# Patient Record
Sex: Female | Born: 1945 | Race: White | Hispanic: No | Smoking: Former smoker
Health system: Southern US, Community
[De-identification: ages and names within clinical notes are randomized; demographics above are authoritative.]

## PROBLEM LIST (undated history)

## (undated) DIAGNOSIS — E785 Hyperlipidemia, unspecified: Secondary | ICD-10-CM

## (undated) DIAGNOSIS — D869 Sarcoidosis, unspecified: Secondary | ICD-10-CM

## (undated) DIAGNOSIS — M1990 Unspecified osteoarthritis, unspecified site: Secondary | ICD-10-CM

## (undated) HISTORY — DX: Hyperlipidemia, unspecified: E78.5

## (undated) HISTORY — PX: REDUCTION MAMMAPLASTY: SUR839

## (undated) HISTORY — PX: BREAST BIOPSY: SHX20

## (undated) HISTORY — DX: Unspecified osteoarthritis, unspecified site: M19.90

## (undated) HISTORY — PX: BREAST SURGERY: SHX581

## (undated) HISTORY — PX: ABDOMINAL HYSTERECTOMY: SHX81

---

## 1998-03-22 ENCOUNTER — Encounter: Admission: RE | Admit: 1998-03-22 | Discharge: 1998-06-20 | Payer: Self-pay | Admitting: Anesthesiology

## 1999-01-03 ENCOUNTER — Emergency Department (HOSPITAL_COMMUNITY): Admission: EM | Admit: 1999-01-03 | Discharge: 1999-01-03 | Payer: Self-pay | Admitting: Emergency Medicine

## 1999-01-03 ENCOUNTER — Encounter: Payer: Self-pay | Admitting: Emergency Medicine

## 1999-08-18 ENCOUNTER — Encounter: Payer: Self-pay | Admitting: Family Medicine

## 1999-08-18 ENCOUNTER — Encounter: Admission: RE | Admit: 1999-08-18 | Discharge: 1999-08-18 | Payer: Self-pay | Admitting: Family Medicine

## 1999-08-22 ENCOUNTER — Encounter: Admission: RE | Admit: 1999-08-22 | Discharge: 1999-08-22 | Payer: Self-pay | Admitting: Family Medicine

## 1999-08-22 ENCOUNTER — Encounter: Payer: Self-pay | Admitting: Family Medicine

## 2000-12-05 ENCOUNTER — Encounter: Payer: Self-pay | Admitting: Family Medicine

## 2000-12-05 ENCOUNTER — Encounter: Admission: RE | Admit: 2000-12-05 | Discharge: 2000-12-05 | Payer: Self-pay | Admitting: Family Medicine

## 2001-04-23 ENCOUNTER — Encounter: Admission: RE | Admit: 2001-04-23 | Discharge: 2001-07-22 | Payer: Self-pay | Admitting: Family Medicine

## 2001-07-30 ENCOUNTER — Encounter (INDEPENDENT_AMBULATORY_CARE_PROVIDER_SITE_OTHER): Payer: Self-pay | Admitting: Specialist

## 2001-07-30 ENCOUNTER — Ambulatory Visit (HOSPITAL_COMMUNITY): Admission: RE | Admit: 2001-07-30 | Discharge: 2001-07-30 | Payer: Self-pay | Admitting: Gastroenterology

## 2004-12-07 ENCOUNTER — Emergency Department (HOSPITAL_COMMUNITY): Admission: EM | Admit: 2004-12-07 | Discharge: 2004-12-07 | Payer: Self-pay | Admitting: Emergency Medicine

## 2005-08-01 ENCOUNTER — Encounter: Admission: RE | Admit: 2005-08-01 | Discharge: 2005-08-01 | Payer: Self-pay | Admitting: Family Medicine

## 2006-05-15 ENCOUNTER — Other Ambulatory Visit: Admission: RE | Admit: 2006-05-15 | Discharge: 2006-05-15 | Payer: Self-pay | Admitting: Family Medicine

## 2006-05-29 ENCOUNTER — Encounter: Admission: RE | Admit: 2006-05-29 | Discharge: 2006-05-29 | Payer: Self-pay | Admitting: Family Medicine

## 2006-06-10 ENCOUNTER — Encounter: Admission: RE | Admit: 2006-06-10 | Discharge: 2006-06-10 | Payer: Self-pay | Admitting: Family Medicine

## 2006-07-24 ENCOUNTER — Ambulatory Visit (HOSPITAL_COMMUNITY): Admission: RE | Admit: 2006-07-24 | Discharge: 2006-07-24 | Payer: Self-pay | Admitting: Cardiology

## 2006-08-05 ENCOUNTER — Encounter: Admission: RE | Admit: 2006-08-05 | Discharge: 2006-08-05 | Payer: Self-pay | Admitting: Family Medicine

## 2006-11-12 ENCOUNTER — Other Ambulatory Visit: Admission: RE | Admit: 2006-11-12 | Discharge: 2006-11-12 | Payer: Self-pay | Admitting: Obstetrics and Gynecology

## 2007-07-07 ENCOUNTER — Other Ambulatory Visit: Admission: RE | Admit: 2007-07-07 | Discharge: 2007-07-07 | Payer: Self-pay | Admitting: Obstetrics and Gynecology

## 2007-08-04 ENCOUNTER — Other Ambulatory Visit: Admission: RE | Admit: 2007-08-04 | Discharge: 2007-08-04 | Payer: Self-pay | Admitting: Obstetrics and Gynecology

## 2008-08-05 ENCOUNTER — Other Ambulatory Visit: Admission: RE | Admit: 2008-08-05 | Discharge: 2008-08-05 | Payer: Self-pay | Admitting: Obstetrics and Gynecology

## 2009-01-31 ENCOUNTER — Other Ambulatory Visit: Admission: RE | Admit: 2009-01-31 | Discharge: 2009-01-31 | Payer: Self-pay | Admitting: Obstetrics and Gynecology

## 2009-11-14 ENCOUNTER — Encounter: Admission: RE | Admit: 2009-11-14 | Discharge: 2009-11-14 | Payer: Self-pay | Admitting: Family Medicine

## 2010-11-24 NOTE — Procedures (Signed)
[facility]  Patient:    Alexis Murphy, Alexis Murphy Visit Number: [id number] MRN: [id number]          Service Type: END Location: ENDO Attending Physician:  Rafael Bihari Dictated by:   Elyse Jarvis Amedeo Plenty, M.D. Proc. Date: 07/30/01 Admit Date:  07/30/2001   CC:         Russ Halo. Sheryn Bison, M.D.   Procedure Report  PROCEDURE:  Colonoscopy with polypectomy.  INDICATIONS:  Polyps seen on flexible sigmoidoscopy.  DESCRIPTION OF PROCEDURE:  The patient was placed in the left lateral decubitus position and placed on the pulse monitor with continuous low flow oxygen delivered via nasal cannula. She was sedated with 100 mg IV Demerol and 10 mg IV Versed. The Olympus video colonoscope was inserted into the rectum and advanced to the cecum, confirmed by transillumination at McBurneys point and visualization of the ileocecal valve and appendiceal orifice. The prep was excellent. The terminal ileum was entered and explored for several centimeters and appeared to within normal limits. The cecum, ascending, transverse, descending, and sigmoid colon all appeared normal with no masses, polyps, diverticula or other mucosal abnormalities. The rectum revealed approximately four sessile relatively flat polyps no more than 6 mm in diameter from 3 cm from the anal verge up to about 18 cm from the anal verge. These were all fulgurated by hot biopsy. There were two or three diminutive polyps no more than 3 mm in diameter that I did not fulgurate. The colonoscope was then withdrawn and the patient returned to the recovery room in stable condition. She tolerated the procedure well and there were no immediate complications.  IMPRESSION:  Several small rectal polyps.  PLAN:  Await histology and if adenomatous, will recommend a repeat study in three years. Dictated by:   Elyse Jarvis Amedeo Plenty, M.D. Attending Physician:  Rafael Bihari DD:  07/30/01 TD:  07/31/01 Job:  [id number]

## 2012-02-29 ENCOUNTER — Other Ambulatory Visit: Payer: Self-pay | Admitting: Oral Surgery

## 2012-04-25 ENCOUNTER — Other Ambulatory Visit: Payer: Self-pay | Admitting: Family Medicine

## 2012-04-25 DIAGNOSIS — Z1231 Encounter for screening mammogram for malignant neoplasm of breast: Secondary | ICD-10-CM

## 2012-04-25 DIAGNOSIS — Z780 Asymptomatic menopausal state: Secondary | ICD-10-CM

## 2012-05-27 ENCOUNTER — Other Ambulatory Visit: Payer: Self-pay

## 2012-05-27 ENCOUNTER — Ambulatory Visit: Payer: Self-pay

## 2012-07-01 ENCOUNTER — Ambulatory Visit
Admission: RE | Admit: 2012-07-01 | Discharge: 2012-07-01 | Disposition: A | Discharge status: Home / Self Care | Payer: Medicare Other | Source: Ambulatory Visit | Attending: Family Medicine | Admitting: Family Medicine

## 2012-07-01 ENCOUNTER — Other Ambulatory Visit: Payer: Self-pay

## 2012-07-01 DIAGNOSIS — Z1231 Encounter for screening mammogram for malignant neoplasm of breast: Secondary | ICD-10-CM

## 2012-07-08 ENCOUNTER — Ambulatory Visit
Admission: RE | Admit: 2012-07-08 | Discharge: 2012-07-08 | Disposition: A | Discharge status: Home / Self Care | Payer: Medicare Other | Source: Ambulatory Visit | Attending: Family Medicine | Admitting: Family Medicine

## 2012-07-08 DIAGNOSIS — Z780 Asymptomatic menopausal state: Secondary | ICD-10-CM

## 2012-07-23 ENCOUNTER — Other Ambulatory Visit: Payer: Self-pay | Admitting: Podiatry

## 2012-07-23 DIAGNOSIS — M79672 Pain in left foot: Secondary | ICD-10-CM

## 2012-07-25 ENCOUNTER — Ambulatory Visit
Admission: RE | Admit: 2012-07-25 | Discharge: 2012-07-25 | Disposition: A | Discharge status: Home / Self Care | Payer: Medicare Other | Source: Ambulatory Visit | Attending: Podiatry | Admitting: Podiatry

## 2012-07-25 DIAGNOSIS — M79672 Pain in left foot: Secondary | ICD-10-CM

## 2012-07-25 MED ORDER — GADOBENATE DIMEGLUMINE 529 MG/ML IV SOLN
8.0000 mL | Freq: Once | INTRAVENOUS | Status: AC | PRN
  Administered 2012-07-25: 8 mL via INTRAVENOUS

## 2015-06-23 ENCOUNTER — Other Ambulatory Visit: Payer: Self-pay

## 2015-06-23 DIAGNOSIS — Z1231 Encounter for screening mammogram for malignant neoplasm of breast: Secondary | ICD-10-CM

## 2015-06-23 DIAGNOSIS — Z9889 Other specified postprocedural states: Secondary | ICD-10-CM

## 2015-07-22 ENCOUNTER — Ambulatory Visit: Payer: Medicare Other

## 2015-07-25 ENCOUNTER — Ambulatory Visit
Admission: RE | Admit: 2015-07-25 | Discharge: 2015-07-25 | Disposition: A | Discharge status: Home / Self Care | Payer: PPO | Source: Ambulatory Visit

## 2015-07-25 DIAGNOSIS — Z9889 Other specified postprocedural states: Secondary | ICD-10-CM

## 2015-07-25 DIAGNOSIS — Z1231 Encounter for screening mammogram for malignant neoplasm of breast: Secondary | ICD-10-CM

## 2015-07-27 ENCOUNTER — Other Ambulatory Visit: Payer: Self-pay | Admitting: Family Medicine

## 2015-07-27 DIAGNOSIS — R928 Other abnormal and inconclusive findings on diagnostic imaging of breast: Secondary | ICD-10-CM

## 2015-08-01 ENCOUNTER — Ambulatory Visit
Admission: RE | Admit: 2015-08-01 | Discharge: 2015-08-01 | Disposition: A | Discharge status: Home / Self Care | Payer: PPO | Source: Ambulatory Visit | Attending: Family Medicine | Admitting: Family Medicine

## 2015-08-01 ENCOUNTER — Other Ambulatory Visit: Payer: Self-pay | Admitting: Family Medicine

## 2015-08-01 DIAGNOSIS — R928 Other abnormal and inconclusive findings on diagnostic imaging of breast: Secondary | ICD-10-CM

## 2015-08-01 DIAGNOSIS — N63 Unspecified lump in breast: Secondary | ICD-10-CM | POA: Diagnosis not present

## 2015-08-01 DIAGNOSIS — N6320 Unspecified lump in the left breast, unspecified quadrant: Secondary | ICD-10-CM

## 2015-08-03 ENCOUNTER — Ambulatory Visit
Admission: RE | Admit: 2015-08-03 | Discharge: 2015-08-03 | Disposition: A | Discharge status: Home / Self Care | Payer: PPO | Source: Ambulatory Visit | Attending: Family Medicine | Admitting: Family Medicine

## 2015-08-03 ENCOUNTER — Other Ambulatory Visit: Payer: Self-pay | Admitting: Family Medicine

## 2015-08-03 DIAGNOSIS — N6320 Unspecified lump in the left breast, unspecified quadrant: Secondary | ICD-10-CM

## 2015-08-03 DIAGNOSIS — N61 Mastitis without abscess: Secondary | ICD-10-CM | POA: Diagnosis not present

## 2015-08-03 DIAGNOSIS — N63 Unspecified lump in breast: Secondary | ICD-10-CM | POA: Diagnosis not present

## 2015-10-25 DIAGNOSIS — M1711 Unilateral primary osteoarthritis, right knee: Secondary | ICD-10-CM | POA: Diagnosis not present

## 2015-11-02 DIAGNOSIS — M1711 Unilateral primary osteoarthritis, right knee: Secondary | ICD-10-CM | POA: Diagnosis not present

## 2015-11-09 DIAGNOSIS — M1711 Unilateral primary osteoarthritis, right knee: Secondary | ICD-10-CM | POA: Diagnosis not present

## 2015-11-09 DIAGNOSIS — M25461 Effusion, right knee: Secondary | ICD-10-CM | POA: Diagnosis not present

## 2015-12-21 DIAGNOSIS — M1711 Unilateral primary osteoarthritis, right knee: Secondary | ICD-10-CM | POA: Diagnosis not present

## 2016-01-12 DIAGNOSIS — M25561 Pain in right knee: Secondary | ICD-10-CM | POA: Diagnosis not present

## 2016-01-19 DIAGNOSIS — M1711 Unilateral primary osteoarthritis, right knee: Secondary | ICD-10-CM | POA: Diagnosis not present

## 2016-01-26 DIAGNOSIS — M1711 Unilateral primary osteoarthritis, right knee: Secondary | ICD-10-CM | POA: Diagnosis not present

## 2016-03-08 DIAGNOSIS — M1711 Unilateral primary osteoarthritis, right knee: Secondary | ICD-10-CM | POA: Diagnosis not present

## 2016-03-08 DIAGNOSIS — M25561 Pain in right knee: Secondary | ICD-10-CM | POA: Diagnosis not present

## 2016-04-10 DIAGNOSIS — Z23 Encounter for immunization: Secondary | ICD-10-CM | POA: Diagnosis not present

## 2016-04-10 DIAGNOSIS — I739 Peripheral vascular disease, unspecified: Secondary | ICD-10-CM | POA: Diagnosis not present

## 2016-04-10 DIAGNOSIS — G629 Polyneuropathy, unspecified: Secondary | ICD-10-CM | POA: Diagnosis not present

## 2016-06-22 ENCOUNTER — Other Ambulatory Visit: Payer: Self-pay | Admitting: Family Medicine

## 2016-06-22 ENCOUNTER — Other Ambulatory Visit: Payer: Self-pay | Admitting: Obstetrics and Gynecology

## 2016-06-22 DIAGNOSIS — Z1231 Encounter for screening mammogram for malignant neoplasm of breast: Secondary | ICD-10-CM

## 2016-06-23 DIAGNOSIS — R600 Localized edema: Secondary | ICD-10-CM | POA: Diagnosis not present

## 2016-07-11 DIAGNOSIS — M1990 Unspecified osteoarthritis, unspecified site: Secondary | ICD-10-CM | POA: Diagnosis not present

## 2016-07-11 DIAGNOSIS — Z0000 Encounter for general adult medical examination without abnormal findings: Secondary | ICD-10-CM | POA: Diagnosis not present

## 2016-07-11 DIAGNOSIS — F419 Anxiety disorder, unspecified: Secondary | ICD-10-CM | POA: Diagnosis not present

## 2016-07-11 DIAGNOSIS — E7800 Pure hypercholesterolemia, unspecified: Secondary | ICD-10-CM | POA: Diagnosis not present

## 2016-07-11 DIAGNOSIS — I7300 Raynaud's syndrome without gangrene: Secondary | ICD-10-CM | POA: Diagnosis not present

## 2016-07-11 DIAGNOSIS — Z79899 Other long term (current) drug therapy: Secondary | ICD-10-CM | POA: Diagnosis not present

## 2016-07-11 DIAGNOSIS — K219 Gastro-esophageal reflux disease without esophagitis: Secondary | ICD-10-CM | POA: Diagnosis not present

## 2016-07-11 DIAGNOSIS — G629 Polyneuropathy, unspecified: Secondary | ICD-10-CM | POA: Diagnosis not present

## 2016-07-11 DIAGNOSIS — I739 Peripheral vascular disease, unspecified: Secondary | ICD-10-CM | POA: Diagnosis not present

## 2016-07-11 DIAGNOSIS — N183 Chronic kidney disease, stage 3 (moderate): Secondary | ICD-10-CM | POA: Diagnosis not present

## 2016-07-26 ENCOUNTER — Ambulatory Visit
Admission: RE | Admit: 2016-07-26 | Discharge: 2016-07-26 | Disposition: A | Discharge status: Home / Self Care | Payer: PPO | Source: Ambulatory Visit | Attending: Family Medicine | Admitting: Family Medicine

## 2016-07-26 DIAGNOSIS — Z1231 Encounter for screening mammogram for malignant neoplasm of breast: Secondary | ICD-10-CM | POA: Diagnosis not present

## 2016-09-04 ENCOUNTER — Other Ambulatory Visit: Payer: Self-pay | Admitting: Surgery

## 2016-09-04 DIAGNOSIS — I739 Peripheral vascular disease, unspecified: Secondary | ICD-10-CM

## 2016-10-03 ENCOUNTER — Encounter: Payer: Self-pay | Admitting: Surgery

## 2016-10-15 ENCOUNTER — Ambulatory Visit (INDEPENDENT_AMBULATORY_CARE_PROVIDER_SITE_OTHER): Payer: PPO | Admitting: Surgery

## 2016-10-15 ENCOUNTER — Ambulatory Visit (HOSPITAL_COMMUNITY)
Admission: RE | Admit: 2016-10-15 | Discharge: 2016-10-15 | Disposition: A | Discharge status: Home / Self Care | Payer: PPO | Source: Ambulatory Visit | Attending: Surgery | Admitting: Surgery

## 2016-10-15 ENCOUNTER — Encounter: Payer: Self-pay | Admitting: Surgery

## 2016-10-15 VITALS — BP 142/93 | HR 111 | Temp 97.1°F | Resp 18 | Ht 60.5 in | Wt 156.1 lb

## 2016-10-15 DIAGNOSIS — I70213 Atherosclerosis of native arteries of extremities with intermittent claudication, bilateral legs: Secondary | ICD-10-CM | POA: Diagnosis not present

## 2016-10-15 DIAGNOSIS — I739 Peripheral vascular disease, unspecified: Secondary | ICD-10-CM

## 2016-10-15 NOTE — Progress Notes (Signed)
[facility] [place]  Patient name: Alexis Murphy MRN: [id number] DOB: 03-24-1946 Sex: female   REFERRING PROVIDER:    Dr. Harrington Challenger   REASON FOR CONSULT:    Claudication  HISTORY OF PRESENT ILLNESS:   Alexis Murphy is a 71 y.o. female, who is Referred today for evaluation of leg pain.  The patient states approximate 4 years ago she went to a podiatrist for problem with her left fifth toe.  This was initially felt to be a ingrown toenail which was removed, however this did not alleviate her symptoms.  She went on to have an MRI of the foot.  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  The patient states that she will occasionally get severe pain and bluish discoloration in her toes.  This is self-limited.  She denies claudication.  She denies nonhealing wounds.  She has taken Neurontin which has helped.  She tries to avoid cold temperature.  She does better in the summer.  This does not happen routinely.  She has tried [place] without relief.  She has also been given a calcium channel blocker for the possibility of Raynaud's.  There is a history of autoimmune disease in the family with her mother having been diagnosed with lupus.  The patient takes a statin for hypercholesterolemia.  She is a former smoker.  PAST MEDICAL HISTORY    No past medical history on file.   FAMILY HISTORY   No family history on file.  SOCIAL HISTORY:   Social History   Social History  . Marital status: Widowed    Spouse name: N/A  . Number of children: N/A  . Years of education: N/A   Occupational History  . Not on file.   Social History Main Topics  . Smoking status: Not on file  . Smokeless tobacco: Not on file  . Alcohol use Not on file  . Drug use: Unknown  . Sexual activity: Not on file   Other Topics Concern  . Not on file   Social History Narrative  . No narrative on file     ALLERGIES:    No Known Allergies  CURRENT MEDICATIONS:    No current outpatient prescriptions on file.   No current facility-administered medications for this visit.     REVIEW OF SYSTEMS:   [X]  denotes positive finding, [ ]  denotes negative finding Cardiac  Comments:  Chest pain or chest pressure:    Shortness of breath upon exertion:    Short of breath when lying flat:    Irregular heart rhythm:        Vascular    Pain in calf, thigh, or hip brought on by ambulation:    Pain in feet at night that wakes you up from your sleep:     Blood clot in your veins:    Leg swelling:  x       Pulmonary    Oxygen at home:    Productive cough:     Wheezing:         Neurologic    Sudden weakness in arms or legs:     Sudden numbness in arms or legs:     Sudden onset of difficulty speaking or slurred speech:    Temporary loss of vision in one eye:     Problems with dizziness:         Gastrointestinal    Blood in stool:  Vomited blood:         Genitourinary    Burning when urinating:     Blood in urine:        Psychiatric    Major depression:         Hematologic    Bleeding problems:    Problems with blood clotting too easily:        Skin    Rashes or ulcers:        Constitutional    Fever or chills:     PHYSICAL EXAM:   There were no vitals filed for this visit.  GENERAL: The patient is a well-nourished female, in no acute distress. The vital signs are documented above. CARDIAC: There is a regular rate and rhythm.  VASCULAR: Palpable pedal pulses bilaterally.  No carotid bruits. PULMONARY: Nonlabored respirations MUSCULOSKELETAL: There are no major deformities or cyanosis. NEUROLOGIC: No focal weakness or paresthesias are detected. SKIN: There are no ulcers or rashes noted.  Slight bluish discoloration on bilateral great toes. PSYCHIATRIC: The patient has a normal affect.  STUDIES:   Vascular lab studies were performed today which I interpreted.   Her ABIs are normal bilaterally with triphasic waveforms.  She has a normal toe pressure on the left.  This was undetectable on the right great toe  ASSESSMENT and PLAN   Foot pain: I told the patient that with palpable pedal pulses and normal ABIs, she does not have Buerger's disease.  However, she has an absent toe pressure on the right.  She also has a history of issues with the left fifth toe.  This certainly raises the possibility that she may be having embolic issues.  We discussed different options for further evaluation and she would like to pursue getting a CT angiogram of the chest abdomen and pelvis with runoff.  In the meantime, I stressed the importance of taking a baby aspirin.  The patient may also have sequelae from Raynaud's phenomenon.  She has been started on a calcium channel blocker.  I have encouraged her to avoid cooler temperatures.  Interestingly, her mother has lupus so this could be the early stages of some form of autoimmune process.  I have the patient scheduled to follow-up with me after her CT scan has been performed.   Annamarie Major, MD [facility] [phone] Pager [phone]

## 2016-10-16 NOTE — Addendum Note (Signed)
Addended by: Lianne Cure A on: 10/16/2016 09:45 AM   Modules accepted: Orders

## 2016-10-26 ENCOUNTER — Ambulatory Visit
Admission: RE | Admit: 2016-10-26 | Discharge: 2016-10-26 | Disposition: A | Discharge status: Home / Self Care | Payer: PPO | Source: Ambulatory Visit | Attending: Surgery | Admitting: Surgery

## 2016-10-26 ENCOUNTER — Encounter: Payer: Self-pay | Admitting: Surgery

## 2016-10-26 DIAGNOSIS — I70213 Atherosclerosis of native arteries of extremities with intermittent claudication, bilateral legs: Secondary | ICD-10-CM

## 2016-10-26 DIAGNOSIS — I749 Embolism and thrombosis of unspecified artery: Secondary | ICD-10-CM | POA: Diagnosis not present

## 2016-10-26 MED ORDER — IOPAMIDOL (ISOVUE-370) INJECTION 76%
80.0000 mL | Freq: Once | INTRAVENOUS | Status: AC | PRN
  Administered 2016-10-26: 80 mL via INTRAVENOUS

## 2016-11-05 ENCOUNTER — Encounter: Payer: Self-pay | Admitting: Surgery

## 2016-11-05 ENCOUNTER — Ambulatory Visit (INDEPENDENT_AMBULATORY_CARE_PROVIDER_SITE_OTHER): Payer: PPO | Admitting: Surgery

## 2016-11-05 VITALS — BP 128/78 | HR 103 | Temp 98.2°F | Resp 20 | Ht 60.5 in | Wt 156.0 lb

## 2016-11-05 DIAGNOSIS — I70213 Atherosclerosis of native arteries of extremities with intermittent claudication, bilateral legs: Secondary | ICD-10-CM | POA: Diagnosis not present

## 2016-11-05 NOTE — Progress Notes (Signed)
[facility] [place]  Patient name: Alexis Murphy MRN: [id number] DOB: 1945-10-21 Sex: female   REASON FOR VISIT:    Follow up  [place]:   Alexis Murphy is a 71 y.o. female, who is Referred today for evaluation of leg pain.  The patient states approximate 4 years ago she went to a podiatrist for problem with her left fifth toe.  This was initially felt to be a ingrown toenail which was removed, however this did not alleviate her symptoms.  She went on to have an MRI of the foot.  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  The patient states that she will occasionally get severe pain and bluish discoloration in her toes.  This is self-limited.  She denies claudication.  She denies nonhealing wounds.  She has taken Neurontin which has helped.  She tries to avoid cold temperature.  She does better in the summer.  This does not happen routinely.  She has tried [place] without relief.  She has also been given a calcium channel blocker for the possibility of Raynaud's.  There is a history of autoimmune disease in the family with her mother having been diagnosed with lupus.  The patient takes a statin for hypercholesterolemia.  She is a former smoker.  I was concerned about possible embolic disease, therefore she was sent for CT scan and is here today to discuss the results.   PAST MEDICAL HISTORY:   Past Medical History:  Diagnosis Date  . Arthritis   . Hyperlipidemia      FAMILY HISTORY:   Family History  Problem Relation Age of Onset  . Heart disease Father   . Heart disease Brother     SOCIAL HISTORY:   Social History  Substance Use Topics  . Smoking status: Former Smoker    Quit date: 10/15/2007  . Smokeless tobacco: Never Used  . Alcohol use Yes     ALLERGIES:   Allergies  Allergen Reactions  . Codeine Other (See Comments)    Vomiting     . Penicillins Rash     CURRENT MEDICATIONS:   Current Outpatient Prescriptions  Medication Sig Dispense Refill  . aspirin 81 MG chewable tablet Chew by mouth daily.    Marland Kitchen amLODipine (NORVASC) 5 MG tablet     . atorvastatin (LIPITOR) 40 MG tablet     . omeprazole (PRILOSEC) 40 MG capsule Take 40 mg by mouth daily.    Marland Kitchen venlafaxine XR (EFFEXOR-XR) 150 MG 24 hr capsule      No current facility-administered medications for this visit.     REVIEW OF SYSTEMS:   [X]  denotes positive finding, [ ]  denotes negative finding Cardiac  Comments:  Chest pain or chest pressure:    Shortness of breath upon exertion:    Short of breath when lying flat:    Irregular heart rhythm:        Vascular    Pain in calf, thigh, or hip brought on by ambulation:    Pain in feet at night that wakes you up from your sleep:     Blood clot in your veins:    Leg swelling:         Pulmonary    Oxygen at home:    Productive cough:     Wheezing:         Neurologic    Sudden weakness in arms or legs:  Sudden numbness in arms or legs:     Sudden onset of difficulty speaking or slurred speech:    Temporary loss of vision in one eye:     Problems with dizziness:         Gastrointestinal    Blood in stool:     Vomited blood:         Genitourinary    Burning when urinating:     Blood in urine:        Psychiatric    Major depression:         Hematologic    Bleeding problems:    Problems with blood clotting too easily:        Skin    Rashes or ulcers:        Constitutional    Fever or chills:      PHYSICAL EXAM:   Vitals:   11/05/16 1612  BP: 128/78  Pulse: (!) 103  Resp: 20  Temp: 98.2 F (36.8 C)  TempSrc: Oral  SpO2: 94%  Weight: 156 lb (70.8 kg)  Height: 5' 0.5" (1.537 m)    GENERAL: The patient is a well-nourished female, in no acute distress. The vital signs are documented above. CARDIAC: There is a regular rate and rhythm.  PULMONARY: Non-labored  respirations MUSCULOSKELETAL: There are no major deformities or cyanosis. NEUROLOGIC: No focal weakness or paresthesias are detected. SKIN: There are no ulcers or rashes noted. PSYCHIATRIC: The patient has a normal affect.  STUDIES:   I have reviewed her CT scan of the chest abdomen and pelvis with runoff with the following findings:  There is irregular plaque in the thoracic aortic arch as well as the distal descending thoracic aorta. This could be a source of emboli.  There is irregular plaque throughout the abdominal aorta as well.  Iliac arteries are patent. Femoral arteries are patent. Popliteal arteries are patent with 3 vessel runoff bilaterally.  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  She has multiple areas that could be the source of her embolic disease.  Because of the diffuse nature of this process, I don't think that she is a surgical candidate to have this repaired.  Therefore, I have encouraged her to treat this medically.  Prior to evaluating her, she was not on any anticoagulation or antiplatelet medication.  She states that she bruises too easily.  I started her on a 81 mg aspirin, and she tolerated that and has not had any episodes of bruising or bleeding.  Therefore, I will continue her on a baby aspirin.  I contemplated adding Plavix to her regimen, however I will see how she does on aspirin and add Plavix if she has another event.  I'm going to repeat her CT scan in 6 months.    Annamarie Major, MD [facility] [phone] Pager [phone]

## 2016-11-06 NOTE — Addendum Note (Signed)
Addended by: Lianne Cure A on: 11/06/2016 09:28 AM   Modules accepted: Orders

## 2016-12-01 ENCOUNTER — Emergency Department (HOSPITAL_COMMUNITY)
Admission: EM | Admit: 2016-12-01 | Discharge: 2016-12-02 | Disposition: A | Discharge status: Home / Self Care | Payer: PPO | Attending: Emergency Medicine | Admitting: Emergency Medicine

## 2016-12-01 ENCOUNTER — Encounter (HOSPITAL_COMMUNITY): Payer: Self-pay | Admitting: Emergency Medicine

## 2016-12-01 DIAGNOSIS — M7989 Other specified soft tissue disorders: Secondary | ICD-10-CM

## 2016-12-01 DIAGNOSIS — Z79899 Other long term (current) drug therapy: Secondary | ICD-10-CM | POA: Diagnosis not present

## 2016-12-01 DIAGNOSIS — R2242 Localized swelling, mass and lump, left lower limb: Secondary | ICD-10-CM | POA: Insufficient documentation

## 2016-12-01 DIAGNOSIS — Z87891 Personal history of nicotine dependence: Secondary | ICD-10-CM | POA: Diagnosis not present

## 2016-12-01 NOTE — ED Triage Notes (Signed)
Patient presents with swollen left foot with pain onset around 5 days ago. Pt sees a vascular specialist however cannot see him for another 2 weeks.

## 2016-12-02 MED ORDER — DOXYCYCLINE HYCLATE 100 MG PO CAPS: 100.0000 mg | ORAL_CAPSULE | Freq: Two times a day (BID) | ORAL | 0 refills | Status: DC

## 2016-12-02 MED ORDER — IBUPROFEN 400 MG PO TABS: 400.0000 mg | ORAL_TABLET | Freq: Four times a day (QID) | ORAL | 0 refills | Status: DC | PRN

## 2016-12-02 NOTE — Discharge Instructions (Signed)
We are not sure what is causing your symptoms - but the plan is to: 1. Have you get a DVT study. If there is a clot - you will have to come to the ER. 2. If there is no clot, start the antibiotic that is prescribed to cover for possible infection. 3. See Dr. Trula Slade. 4. Take motrin for swelling and ice the leg 3 or 4 times a day.  Expect a call from our hospital to get the ultrasound study done.

## 2016-12-02 NOTE — ED Provider Notes (Signed)
[facility] DEPT Provider Note   CSN: [id number] Arrival date & time: 12/01/16  2037   By signing my name below, I, Eunice Blase, attest that this documentation has been prepared under the direction and in the presence of Varney Biles, MD. Electronically signed, Eunice Blase, ED Scribe. 12/05/16. 12:36 AM.   History   Chief Complaint Chief Complaint  Patient presents with  . Foot Swelling   The history is provided by the patient and medical records. No language interpreter was used.    Alexis Murphy is a 71 y.o. female who presents to the Emergency Department with concern for mildly painful, recurrent L ankle swelling gradually worsening x > 1 week. She notes associated blistering on toes leading up to gradual foot swelling in past episodes of similar symptoms. She adds cold sensation in L lower leg, mild numbness to the L foot and hypersensitivity to the affected area. Pt allegedly prescribed 81 mg ASA by her vascular specialist. CAT scan performed with this provider in the past with upcoming CAT scan reported. She states he informed her of a possible obstruction in the past though he has yet been unable to provide a dx for her recurrent ankle swelling. She adds she will be unable to F/U with him for 2 weeks. Pt notes a past diagnosis of Berger's dz; she states this has since been ruled out by her vascular specialist. Pt states she quit 45 years of smoking in 2009. She notes FMHx of phlebitis via her father and blood clots via her sister. No recent traumas noted. No h/o blood clots noted. No leg pain, fevers or chills noted. No other complaints at this time.   Past Medical History:  Diagnosis Date  . Arthritis   . Hyperlipidemia     There are no active problems to display for this patient.   History reviewed. No pertinent surgical history.  OB History    No data available       Home Medications    Prior to Admission medications   Medication Sig Start Date End Date  Taking? Authorizing Provider  amLODipine (NORVASC) 5 MG tablet Take 5 mg by mouth every evening.  08/27/16  Yes [provider]  aspirin 81 MG chewable tablet Chew 81 mg by mouth every evening.    Yes [provider]  atorvastatin (LIPITOR) 40 MG tablet Take 40 mg by mouth every evening.  08/27/16  Yes [provider]  omeprazole (PRILOSEC) 40 MG capsule Take 40 mg by mouth every evening.    Yes [provider]  venlafaxine XR (EFFEXOR-XR) 150 MG 24 hr capsule Take 150 mg by mouth every evening.  08/27/16  Yes [provider]  doxycycline (VIBRAMYCIN) 100 MG capsule Take 1 capsule (100 mg total) by mouth 2 (two) times daily. 12/02/16   Varney Biles, MD  ibuprofen (ADVIL,MOTRIN) 400 MG tablet Take 1 tablet (400 mg total) by mouth every 6 (six) hours as needed. 12/02/16   Varney Biles, MD    Family History Family History  Problem Relation Age of Onset  . Heart disease Father   . Heart disease Brother     Social History Social History  Substance Use Topics  . Smoking status: Former Smoker    Quit date: 10/15/2007  . Smokeless tobacco: Never Used  . Alcohol use Yes     Allergies   Codeine and Penicillins   Review of Systems Review of Systems  Constitutional: Negative for chills and fever.  Cardiovascular: Positive for  leg swelling.  Musculoskeletal: Positive for joint swelling. Negative for arthralgias, gait problem and myalgias.  Skin: Negative for color change, rash and wound.  Neurological: Positive for numbness. Negative for weakness.  All other systems reviewed and are negative.    Physical Exam Updated Vital Signs BP 131/80 (BP Location: Left Arm)   Pulse (!) 101   Temp 98.1 F (36.7 C) (Oral)   Resp 18   SpO2 95%   Physical Exam  Constitutional: She is oriented to person, place, and time. She appears well-developed and well-nourished.  HENT:  Head: Normocephalic.  Eyes: EOM are normal.  Neck: Normal range of  motion.  Pulmonary/Chest: Effort normal.  Abdominal: She exhibits no distension.  Musculoskeletal: Normal range of motion. She exhibits edema and tenderness.  LLE: swelling over the ankle and distal foot. Foot is slightly red and it is mildly warmer to touch. There is pitting appreciated over the foot. There is TTP over the foot, and D/P 2+. ROM of the ankle is intact and nontender. No tenderness with passive ROM of the ankle or toes.  Neurological: She is alert and oriented to person, place, and time.  Psychiatric: She has a normal mood and affect.  Nursing note and vitals reviewed.    ED Treatments / Results  DIAGNOSTIC STUDIES: Oxygen Saturation is 95% on RA, adequate by my interpretation.    COORDINATION OF CARE: 12:12 AM-Discussed next steps with pt. Pt verbalized understanding and is agreeable with the plan. Will Rx medication and refer to outpatient [place].   Labs (all labs ordered are listed, but only abnormal results are displayed) Labs Reviewed - No data to display  EKG  EKG Interpretation None       Radiology No results found.  Procedures Procedures (including critical care time)  Medications Ordered in ED Medications - No data to display   Initial Impression / Assessment and Plan / ED Course  I have reviewed the triage vital signs and the nursing notes.  Pertinent labs & imaging results that were available during my care of the patient were reviewed by me and considered in my medical decision making (see chart for details).    Pt comes in with cc of L foot swelling. Pt has foot and ankle swelling. She has no signs of infection or trauma. Ankle ROM is normal. Pt has no calf swelling or tenderness. PT is ambulatory.  Pt has hx of suspected vasculopathy, but doesn't carry a definitive diagnosis. LAst month pt had a CT angio chest/abd/bifem - and she was found to have clots in the thoracic chest. Currently, there is no petechiae appreciated, nor is there signs of  cold, clammy, dusky foot or toes that make [place] think of ischemic limb. I will order a DVT study. If DVT is neg, she has been advised to see PCP. Pt reported that vascular surgery had also requested her to see them if the symptoms get worse - so she has been advised to f/u them per vascular team's advise.  Final Clinical Impressions(s) / ED Diagnoses   Final diagnoses:  Foot swelling    New Prescriptions Discharge Medication List as of 12/04/2016  3:10 PM    START taking these medications   Details  doxycycline (VIBRAMYCIN) 100 MG capsule Take 1 capsule (100 mg total) by mouth 2 (two) times daily., Starting Sun 12/02/2016, Print    ibuprofen (ADVIL,MOTRIN) 400 MG tablet Take 1 tablet (400 mg total) by mouth every 6 (six) hours as needed., Starting Sun 12/02/2016,  Print       I personally performed the services described in this documentation, which was scribed in my presence. The recorded information has been reviewed and is accurate.    Varney Biles, MD 12/05/16 [phone]

## 2016-12-04 ENCOUNTER — Ambulatory Visit (HOSPITAL_COMMUNITY)
Admission: RE | Admit: 2016-12-04 | Discharge: 2016-12-04 | Disposition: A | Discharge status: Home / Self Care | Payer: PPO | Source: Ambulatory Visit | Attending: Emergency Medicine | Admitting: Emergency Medicine

## 2016-12-04 DIAGNOSIS — M79609 Pain in unspecified limb: Secondary | ICD-10-CM | POA: Diagnosis not present

## 2016-12-04 DIAGNOSIS — M7989 Other specified soft tissue disorders: Secondary | ICD-10-CM | POA: Insufficient documentation

## 2016-12-04 DIAGNOSIS — M79672 Pain in left foot: Secondary | ICD-10-CM | POA: Insufficient documentation

## 2016-12-04 NOTE — Progress Notes (Signed)
Preliminary results by tech - Left Lower Ext. Venous Duplex Completed. Negative for deep and superficial vein thrombosis in the left leg. Anushri Casalino, BS, RDMS, RVT  

## 2016-12-12 ENCOUNTER — Encounter: Payer: Self-pay | Admitting: Family

## 2016-12-17 ENCOUNTER — Encounter: Payer: Self-pay | Admitting: Family

## 2016-12-17 ENCOUNTER — Ambulatory Visit (INDEPENDENT_AMBULATORY_CARE_PROVIDER_SITE_OTHER): Payer: PPO | Admitting: Family

## 2016-12-17 VITALS — BP 100/68 | HR 95 | Temp 97.4°F | Resp 18 | Ht 60.5 in | Wt 155.0 lb

## 2016-12-17 DIAGNOSIS — I731 Thromboangiitis obliterans [Buerger's disease]: Secondary | ICD-10-CM

## 2016-12-17 DIAGNOSIS — Z87891 Personal history of nicotine dependence: Secondary | ICD-10-CM

## 2016-12-17 DIAGNOSIS — M7989 Other specified soft tissue disorders: Secondary | ICD-10-CM

## 2016-12-17 DIAGNOSIS — I70213 Atherosclerosis of native arteries of extremities with intermittent claudication, bilateral legs: Secondary | ICD-10-CM

## 2016-12-17 NOTE — Patient Instructions (Addendum)

## 2016-12-17 NOTE — Progress Notes (Signed)
[facility]   CC: Follow up peripheral artery occlusive disease  History of Present Illness Alexis Murphy is a 71 y.o. female patient whom Dr. Trula Slade evaluated for left leg pain on 11-05-16. In 2014 she went to a podiatrist for problem with her left fifth toe. This was initially felt to be a ingrown toenail which was removed, however this did not alleviate her symptoms. She went on to have an MRI of the foot. 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 The patient states that she will occasionally get severe pain and bluish discoloration in her toes. This is self-limited. She denies claudication. She denies nonhealing wounds. She has taken Neurontin which has helped. She tries to avoid cold temperature. She does better in the summer. This does not happen routinely. She has tried [place] without relief. She has also been given a calcium channel blocker for the possibility of Raynaud's. There is a history of autoimmune disease in the family with her mother having been diagnosed with lupus. At her 11-05-16 visit Dr. 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  She has multiple areas that could be the source of her embolic disease.  Because of the diffuse nature of this process, Dr. Trula Slade did not think that she is a surgical candidate to have this repaired.  Therefore he  encouraged her to treat this medically.  Prior to evaluating her, she was not on any anticoagulation or antiplatelet medication.  She states that she bruises too easily.  He started her on a 81 mg aspirin, and she tolerated that and has not had any episodes of bruising or bleeding.  Therefore, Dr. Trula Slade will continue her on a baby aspirin; he contemplated adding Plavix to her regimen, however will see how she  does on aspirin and add Plavix if she has another event.  Dr. Trula Slade is going to repeat her CT scan in 6 months  The patient takes a statin for hypercholesterolemia. She is a former smoker.  She returns today with c/o left foot swelling and feeling feverish. The swelling does not improve with elevation overnight.   She denies injury, denies insect bite. The pain has lessened, but the swelling has not, at times the pain returns.   She was evaluated at [facility] ED on 12-01-16 for mildly painful, recurrent L ankle swelling gradually worsening x > 1 week. She noted associated blistering on toes leading up to gradual foot swelling in past episodes of similar symptoms. She adds cold sensation in L lower leg, mild numbness to the L foot and hypersensitivity to the affected area. Pt allegedly prescribed 81 mg ASA by her vascular specialist. CAT scan performed with this provider in the past with upcoming CAT scan reported. She states he informed her of a possible obstruction in the past though he has yet been unable to provide a dx for her recurrent ankle swelling. She adds she will be unable to F/U with him for 2 weeks. Pt notes a past diagnosis of Berger's dz; she states this has since been ruled out by her vascular specialist. Pt states she quit 45 years of smoking in 2009. She notes FMHx of phlebitis via her father and blood clots via her sister. No recent traumas noted. No h/o blood clots noted. No leg pain, fevers or chills noted. No  other complaints at this time.   Left leg venous duplex study done on 12-04-16 demonstrated no evidence of deep vein or superficial thrombosis involving the left lower extremity and right common femoral vein.  She started amlodipine about 2016. The left foot swelling started about the first week in May 2018.   She denies claudication symptoms with walking. The lateral aspect of her left leg has felt cool since about 2011 or 2012.  She reports "a slipped disc" and this was  addressed by lysis injection in the early 1980's.    She denies any known history of stroke or TIA.   Pt Diabetic: No Pt smoker: former smoker, quit in 2009, started at age 68 years  Pt meds include: Statin :Yes Betablocker: No ASA: Yes Other anticoagulants/antiplatelets: no  Past Medical History:  Diagnosis Date  . Arthritis   . Hyperlipidemia     Social History Social History  Substance Use Topics  . Smoking status: Former Smoker    Quit date: 10/15/2007  . Smokeless tobacco: Never Used  . Alcohol use Yes    Family History Family History  Problem Relation Age of Onset  . Heart disease Father   . Heart disease Brother     History reviewed. No pertinent surgical history.  Allergies  Allergen Reactions  . Codeine Other (See Comments)    Vomiting   . Penicillins Rash    Has patient had a PCN reaction causing immediate rash, facial/tongue/throat swelling, SOB or lightheadedness with hypotension: yes Has patient had a PCN reaction causing severe rash involving mucus membranes or skin necrosis:  no Has patient had a PCN reaction that required hospitalization:  no Has patient had a PCN reaction occurring within the last 10 years: no If all of the above answers are "NO", then may proceed with Cephalosporin use.     Current Outpatient Prescriptions  Medication Sig Dispense Refill  . amLODipine (NORVASC) 5 MG tablet Take 5 mg by mouth every evening.     Marland Kitchen aspirin 81 MG chewable tablet Chew 81 mg by mouth every evening.     Marland Kitchen atorvastatin (LIPITOR) 40 MG tablet Take 40 mg by mouth every evening.     Marland Kitchen doxycycline (VIBRAMYCIN) 100 MG capsule Take 1 capsule (100 mg total) by mouth 2 (two) times daily. 10 capsule 0  . ibuprofen (ADVIL,MOTRIN) 400 MG tablet Take 1 tablet (400 mg total) by mouth every 6 (six) hours as needed. 15 tablet 0  . omeprazole (PRILOSEC) 40 MG capsule Take 40 mg by mouth every evening.     . venlafaxine XR (EFFEXOR-XR) 150 MG 24 hr capsule Take 150  mg by mouth every evening.      No current facility-administered medications for this visit.     ROS: See HPI for pertinent positives and negatives.   Physical Examination  Vitals:   12/17/16 1047  BP: 100/68  Pulse: 95  Resp: 18  Temp: 97.4 F (36.3 C)  TempSrc: Oral  SpO2: 97%  Weight: 155 lb (70.3 kg)  Height: 5' 0.5" (1.537 m)   Body mass index is 29.77 kg/m.  General: A&O x 3, WDWN, female. Gait: normal Eyes: PERRLA. Pulmonary: Respirations are non labored, CTAB, good air movement Cardiac: regular Rhythm, no detected murmur.         Carotid Bruits Right Left   Negative Negative  Aorta is not palpable. Radial pulses: 1+ palpable bilaterally  VASCULAR EXAM: Extremities with ischemic changes: plantar aspect of all toes are moderately cyanotic,  without Gangrene; without open wounds. Left ankle and upper foot 1-2+non pitting and pitting edema.                                                                                                           LE Pulses Right Left       FEMORAL  2+ palpable  2+ palpable        POPLITEAL  not palpable   not palpable       POSTERIOR TIBIAL  not palpable   2+ palpable        DORSALIS PEDIS      ANTERIOR TIBIAL 2+ palpable  2+ palpable    Abdomen: soft, NT, no palpable masses. Skin: no rashes, no ulcers noted. Musculoskeletal: no muscle wasting or atrophy.  Neurologic: A&O X 3; Appropriate Affect ; SENSATION: normal; MOTOR FUNCTION:  moving all extremities equally, motor strength 5/5 throughout. Speech is fluent/normal. CN 2-12 intact.   DATA 07-25-12 MRI of left foot to evaluate swelling, requested by Dr. Milinda Pointer: Findings: Field heterogeneity is observed in the fifth toe, reducing diagnostic sensitivity and specificity, but fortunately this does not significantly affect the T1 or inversion recovery weighted images.  No significant abnormal osseous edema is observed in the toes, including the  fifth toe.  No destructive bony findings.  There is some low T1 signal in the subcutaneous tissues of the fifth toe, potentially reflecting low-level edema.  I do not perceive definite enhancement in this vicinity although sensitivity is reduced due to the field heterogeneity and accordingly some degree of cellulitis is not readily excluded. Plantar musculature of the distal foot unremarkable.  First digit sesamoids normal. IMPRESSION: 1.  Subcutaneous edema along the small toe, without abscess or osteomyelitis.  This may well represent cellulitis.  ASSESSMENT: CYNDRA FEINBERG is a 71 y.o. female who presents with swelling and pain of left foot and ankle that started the first week in May 2018. Swelling does not improved with elevation of feet overnight. Review of records indicates that this is a recurring problem, at least since 2014; MRI of foot performed at that time requested by pt's podiatrist to evaluate swelling of left foot; see results of MRI above.  Venous duplex on 12-04-16 was negative for DVT of left LE.  Her bilateral pedal pulses are 2+ palpable, even with the left foot swelling present. The left foot swollen area is mildly red with mottling, is non tender to touch. The plantar aspects of all of her toes are moderately cyanotic and cool.  She was started on amlodipine a few years ago by her PCP to address possible Buerger's Disease or Raynaud's.   With 2+ palpable pedal pulses and not DVT, the foot swelling is not due to arterial insufficiency. No DVT of the left LE. Left foot swelling is a recurring problem at least since 2014. I referred pt to her PCP to address this further. Consider evaluation for gout, or rheumatological or collagen tissue problem;  or side effect from amlodipine.    PLAN:  Based on the patient's vascular studies and examination, and after discussing with Dr. Trula Slade, pt will return to clinic as scheduled in October 2018, after CT chest/ab/pelvis, see  Dr. Trula Slade.   I discussed in depth with the patient the nature of atherosclerosis, and emphasized the importance of maximal medical management including strict control of blood pressure, blood glucose, and lipid levels, obtaining regular exercise, and continued cessation of smoking.  The patient is aware that without maximal medical management the underlying atherosclerotic disease process will progress, limiting the benefit of any interventions.  The patient was given information about PAD including signs, symptoms, treatment, what symptoms should prompt the patient to seek immediate medical care, and risk reduction measures to take.  Clemon Chambers, RN, MSN, FNP-C [facility] Phone: [phone]  Clinic MD: Trula Slade  12/17/16 10:54 AM

## 2017-03-08 DIAGNOSIS — M25552 Pain in left hip: Secondary | ICD-10-CM | POA: Diagnosis not present

## 2017-03-10 ENCOUNTER — Encounter (HOSPITAL_COMMUNITY): Payer: Self-pay | Admitting: *Deleted

## 2017-03-10 ENCOUNTER — Emergency Department (HOSPITAL_COMMUNITY)
Admission: EM | Admit: 2017-03-10 | Discharge: 2017-03-10 | Disposition: A | Discharge status: Home / Self Care | Payer: PPO | Attending: Emergency Medicine | Admitting: Emergency Medicine

## 2017-03-10 ENCOUNTER — Emergency Department (HOSPITAL_COMMUNITY): Payer: PPO

## 2017-03-10 DIAGNOSIS — Z87891 Personal history of nicotine dependence: Secondary | ICD-10-CM | POA: Diagnosis not present

## 2017-03-10 DIAGNOSIS — Z7982 Long term (current) use of aspirin: Secondary | ICD-10-CM | POA: Insufficient documentation

## 2017-03-10 DIAGNOSIS — S79912A Unspecified injury of left hip, initial encounter: Secondary | ICD-10-CM | POA: Diagnosis not present

## 2017-03-10 DIAGNOSIS — M25552 Pain in left hip: Secondary | ICD-10-CM | POA: Diagnosis not present

## 2017-03-10 DIAGNOSIS — Z79899 Other long term (current) drug therapy: Secondary | ICD-10-CM | POA: Diagnosis not present

## 2017-03-10 DIAGNOSIS — M25551 Pain in right hip: Secondary | ICD-10-CM | POA: Diagnosis not present

## 2017-03-10 DIAGNOSIS — S79911A Unspecified injury of right hip, initial encounter: Secondary | ICD-10-CM | POA: Diagnosis not present

## 2017-03-10 DIAGNOSIS — S3993XA Unspecified injury of pelvis, initial encounter: Secondary | ICD-10-CM | POA: Diagnosis not present

## 2017-03-10 LAB — BASIC METABOLIC PANEL
ANION GAP: 14 (ref 5–15)
BUN: 15 mg/dL (ref 6–20)
CALCIUM: 9.3 mg/dL (ref 8.9–10.3)
CHLORIDE: 100 mmol/L — AB (ref 101–111)
CO2: 21 mmol/L — AB (ref 22–32)
Creatinine, Ser: 1.26 mg/dL — ABNORMAL HIGH (ref 0.44–1.00)
GFR, EST AFRICAN AMERICAN: 49 mL/min — AB (ref >60)
GFR, EST NON AFRICAN AMERICAN: 42 mL/min — AB (ref >60)
GLUCOSE: 99 mg/dL (ref 65–99)
POTASSIUM: 3.5 mmol/L (ref 3.5–5.1)
SODIUM: 135 mmol/L (ref 135–145)

## 2017-03-10 LAB — CBC
HEMATOCRIT: 37.6 % (ref 36.0–46.0)
HEMOGLOBIN: 12.5 g/dL (ref 12.0–15.0)
MCH: 25.2 pg — ABNORMAL LOW (ref 26.0–34.0)
MCHC: 33.2 g/dL (ref 30.0–36.0)
MCV: 75.8 fL — ABNORMAL LOW (ref 78.0–100.0)
Platelets: 804 10*3/uL — ABNORMAL HIGH (ref 150–400)
RBC: 4.96 MIL/uL (ref 3.87–5.11)
RDW: 17.6 % — ABNORMAL HIGH (ref 11.5–15.5)
WBC: 17.5 10*3/uL — AB (ref 4.0–10.5)

## 2017-03-10 MED ORDER — ACETAMINOPHEN 325 MG PO TABS
650.0000 mg | ORAL_TABLET | Freq: Once | ORAL | Status: AC
  Administered 2017-03-10: 650 mg via ORAL
  Filled 2017-03-10: qty 2

## 2017-03-10 MED ORDER — HYDROCODONE-ACETAMINOPHEN 5-325 MG PO TABS: 1.0000 | ORAL_TABLET | Freq: Four times a day (QID) | ORAL | 0 refills | Status: DC | PRN

## 2017-03-10 MED ORDER — IBUPROFEN 200 MG PO TABS: 400.0000 mg | ORAL_TABLET | Freq: Once | ORAL | Status: DC

## 2017-03-10 MED ORDER — SODIUM CHLORIDE 0.9 % IV BOLUS (SEPSIS): 1000.0000 mL | Freq: Once | INTRAVENOUS | Status: DC

## 2017-03-10 MED ORDER — SODIUM CHLORIDE 0.9 % IV SOLN: 1000.0000 mL | INTRAVENOUS | Status: DC

## 2017-03-10 MED ORDER — ACETAMINOPHEN 325 MG PO TABS: 325.0000 mg | ORAL_TABLET | Freq: Four times a day (QID) | ORAL | 0 refills | Status: DC

## 2017-03-10 NOTE — Discharge Instructions (Signed)
Take Tylenol scheduled. Do not take more than prescribed, as there is also Tylenol in your pain medicine. Do not take tramadol taking these medicines. You may use Norco as needed for breakthrough pain. Use ice or heat as needed for symptom control. Follow-up with her primary care doctor in 1 week if symptoms persist. Return to the emergency room if you develop fever, chills, loss of bowel or bladder control, numbness, tingling, color change of the foot, or any new or worsening symptoms.

## 2017-03-10 NOTE — ED Triage Notes (Signed)
Patient is alert and oriented x4.  She is being seen for left hip pain after she sat down hard in a chair at home 10 days ago.  Patient has a large bruised area to the medial side of her thigh.  Currently she rates her pain 10 of 10 with movement.

## 2017-03-10 NOTE — ED Provider Notes (Signed)
[facility] DEPT Provider Note   CSN: [id number] Arrival date & time: 03/10/17  1046     History   Chief Complaint Chief Complaint  Patient presents with  . Hip Pain    HPI Alexis Murphy is a 71 y.o. female presenting with left upper leg pain.  Patient states that a week ago, she got tripped up on the ottoman and sat down sharply. The next day, she developed pain in her left hip. She was evaluated by her primary care doctor [facility], where an x-ray was negative for fracture. She was told that if pain does not improve, she would possibly need an MRI. Patient presenting today as pain has persisted and worsened. Additionally she reports bruising of the medial thigh. She does not know when this started, but first noticed it yesterday. The area feels tender like a bruise. While at rest, she denies any pain. Any movement or palpation of her anterior hip causes pain. She denies numbness or tingling. She denies pain in her knee ankle or foot. She denies history of same. She has been taking tramadol as prescribed by her primary care doctor without relief of pain. She denies loss of bowel or bladder control. She is not on blood thinners.   HPI  Past Medical History:  Diagnosis Date  . Arthritis   . Hyperlipidemia     There are no active problems to display for this patient.   History reviewed. No pertinent surgical history.  OB History    No data available       Home Medications    Prior to Admission medications   Medication Sig Start Date End Date Taking? Authorizing Provider  amLODipine (NORVASC) 5 MG tablet Take 5 mg by mouth every evening.  08/27/16  Yes [provider]  aspirin 81 MG chewable tablet Chew 81 mg by mouth every evening.    Yes [provider]  atorvastatin (LIPITOR) 40 MG tablet Take 40 mg by mouth every evening.  08/27/16  Yes [provider]  Melatonin 5 MG TABS Take 5 mg by mouth at bedtime.   Yes [provider]    omeprazole (PRILOSEC) 40 MG capsule Take 40 mg by mouth every evening.    Yes [provider]  venlafaxine XR (EFFEXOR-XR) 150 MG 24 hr capsule Take 150 mg by mouth every evening.  08/27/16  Yes [provider]  acetaminophen (TYLENOL) 325 MG tablet Take 1 tablet (325 mg total) by mouth every 6 (six) hours. 03/10/17   Jalaiya Oyster, PA-C  doxycycline (VIBRAMYCIN) 100 MG capsule Take 1 capsule (100 mg total) by mouth 2 (two) times daily. Patient not taking: Reported on 03/10/2017 12/02/16   Varney Biles, MD  HYDROcodone-acetaminophen (NORCO/VICODIN) 5-325 MG tablet Take 1 tablet by mouth every 6 (six) hours as needed for severe pain. 03/10/17   Bomani Oommen, PA-C  ibuprofen (ADVIL,MOTRIN) 400 MG tablet Take 1 tablet (400 mg total) by mouth every 6 (six) hours as needed. Patient not taking: Reported on 03/10/2017 12/02/16   Varney Biles, MD    Family History Family History  Problem Relation Age of Onset  . Heart disease Father   . Heart disease Brother     Social History Social History  Substance Use Topics  . Smoking status: Former Smoker    Quit date: 10/15/2007  . Smokeless tobacco: Never Used  . Alcohol use Yes     Allergies   Codeine and Penicillins   Review of Systems Review of Systems  Gastrointestinal: Negative for abdominal pain.  Genitourinary: Negative for dysuria, frequency and hematuria.  Musculoskeletal: Positive for arthralgias and myalgias. Negative for back pain.  Skin: Positive for color change.  Hematological: Does not bruise/bleed easily.     Physical Exam Updated Vital Signs BP 129/79 (BP Location: Right Arm)   Pulse (!) 115   Temp 98.1 F (36.7 C) (Oral)   Resp 20   Ht 5\' 1"  (1.549 m)   Wt 68 kg (150 lb)   SpO2 97%   BMI 28.34 kg/m   Physical Exam  Constitutional: She is oriented to person, place, and time. She appears well-developed and well-nourished. No distress.  HENT:  Head: Normocephalic and atraumatic.  Eyes:  EOM are normal.  Neck: Normal range of motion.  Cardiovascular: Regular rhythm and intact distal pulses.  Tachycardia present.   Pulmonary/Chest: Effort normal and breath sounds normal. No respiratory distress. She has no wheezes.  Abdominal: Soft. Bowel sounds are normal. She exhibits no distension and no mass. There is no tenderness. There is no guarding.  Musculoskeletal: Normal range of motion.  Bruising of medial left thigh. Tenderness to palpation of anterior hip and medial thigh. No tenderness to palpation of knee or ankle. No tenderness to palpation of posterior calves. Pelvis stable. Sensation intact bilaterally. Color and warmth equal bilaterally. Pedal pulses equal bilaterally. Pain with passive range of motion of left hip. Ambulatory with pain.  Neurological: She is alert and oriented to person, place, and time. No sensory deficit.  Skin: Skin is warm and dry.  Psychiatric: She has a normal mood and affect.  Nursing note and vitals reviewed.    ED Treatments / Results  Labs (all labs ordered are listed, but only abnormal results are displayed) Labs Reviewed  CBC - Abnormal; Notable for the following:       Result Value   WBC 17.5 (*)    MCV 75.8 (*)    MCH 25.2 (*)    RDW 17.6 (*)    Platelets 804 (*)    All other components within normal limits  BASIC METABOLIC PANEL - Abnormal; Notable for the following:    Chloride 100 (*)    CO2 21 (*)    Creatinine, Ser 1.26 (*)    GFR calc non Af Amer 42 (*)    GFR calc Af Amer 49 (*)    All other components within normal limits    EKG  EKG Interpretation None       Radiology Ct Pelvis Wo Contrast  Result Date: 03/10/2017 CLINICAL DATA:  FALL 10 DAYS AGO WITH PERSISTENT LEFT HIP PAIN: CLINICAL DATA:  FALL 10 DAYS AGO WITH PERSISTENT LEFT HIP PAIN Fall, hip pain EXAM: CT PELVIS WITHOUT CONTRAST TECHNIQUE: Multidetector CT imaging of the pelvis was performed following the standard protocol without intravenous contrast.  COMPARISON:  Plain film 03/08/2017 FINDINGS: Urinary Tract:  This aneurysm bladder normal Bowel:  No view of the bowel unremarkable Vascular/Lymphatic: Atherosclerotic calcification of the aorta and branches Reproductive:  Post hysterectomy Other:  No free-fluid in the abdomen pelvis Musculoskeletal: There is thickening of the iliacus muscle on the LEFT. There is a hematoma within the distal iliopsoas muscle on the LEFT proximal to insertion insertion in the lesser trochanter. There is mild inflammation in the fat planes adjacent to the distal ileo psoas muscle. The iliacus muscle measures 3 cm thick in the iliac fossa compared to the contralateral 1.6 cm. The hematoma within the muscle of the iliopsoas muscles measures 3.4 by  3.4 cm (image 32, series 3). No evidence of fracture of the LEFT femur. Hip is located. No pelvic fracture. IMPRESSION: 1. LEFT iliopsoas muscle hematoma proximal to the insertion of the lesser trochanter. Hematoma extends the iliacus muscle. 2. No evidence of LEFT femur fracture or pelvic fracture. Electronically Signed   By: Suzy Bouchard M.D.   On: 03/10/2017 18:08   Dg Hips Bilat W Or Wo Pelvis 3-4 Views  Result Date: 03/10/2017 CLINICAL DATA:  Status post fall 10 days ago with left hip pain. EXAM: DG HIP (WITH OR WITHOUT PELVIS) 3-4V BILAT COMPARISON:  None. FINDINGS: There is no evidence of hip fracture or dislocation. There are mild narrow bilateral hip joint spaces. IMPRESSION: No acute fracture or dislocation. Electronically Signed   By: Abelardo Diesel M.D.   On: 03/10/2017 15:41    Procedures Procedures (including critical care time)  Medications Ordered in ED Medications  acetaminophen (TYLENOL) tablet 650 mg (650 mg Oral Given 03/10/17 1631)     Initial Impression / Assessment and Plan / ED Course  I have reviewed the triage vital signs and the nursing notes.  Pertinent labs & imaging results that were available during my care of the patient were reviewed by me  and considered in my medical decision making (see chart for details).     Patient presenting with left hip pain. Physical exam shows tenderness to palpation of the anterior left hip and bruising of the medial thigh. Patient neurovascularly intact. No pain in lower legs posterior calves. At this time, doubt DVT. Will order x-ray, as I cannot see primary care x-ray in our system. Will order basic labs to rule out anemia. X-ray negative for fracture. Labs reassuring. Case discussed with attending, Dr. Alvino Chapel evaluated the patient. Will order CT for further evaluation. CT negative for fracture, shows hematoma the iliopsoas. Discussed findings with patient. Will discharge with conservative treatment. Patient to follow-up with primary care symptoms are not improving. At this time, patient appears safe for discharge. Return precautions given. Patient states she understands and agrees to plan.  Final Clinical Impressions(s) / ED Diagnoses   Final diagnoses:  Left hip pain    New Prescriptions Discharge Medication List as of 03/10/2017  6:27 PM    START taking these medications   Details  acetaminophen (TYLENOL) 325 MG tablet Take 1 tablet (325 mg total) by mouth every 6 (six) hours., Starting Sun 03/10/2017, Print    HYDROcodone-acetaminophen (NORCO/VICODIN) 5-325 MG tablet Take 1 tablet by mouth every 6 (six) hours as needed for severe pain., Starting Sun 03/10/2017, [place], Viraaj Vorndran, PA-C 03/11/17 1610    Davonna Belling, MD 03/11/17 [phone]

## 2017-03-10 NOTE — ED Notes (Signed)
Pt is alert and oriented x 4 and is verbally responsive. Pt is accompanied with significant other. Pt c/o left hip pain 10/10 and is tearful d/t pain. Pt has a bruise to left inner thigh, Pt stated that she did not notice it until 2 days ago.

## 2017-03-20 DIAGNOSIS — R1032 Left lower quadrant pain: Secondary | ICD-10-CM | POA: Diagnosis not present

## 2017-03-20 DIAGNOSIS — S7002XA Contusion of left hip, initial encounter: Secondary | ICD-10-CM | POA: Diagnosis not present

## 2017-03-20 DIAGNOSIS — M791 Myalgia: Secondary | ICD-10-CM | POA: Diagnosis not present

## 2017-05-13 ENCOUNTER — Other Ambulatory Visit: Payer: PPO

## 2017-06-03 ENCOUNTER — Ambulatory Visit: Payer: PPO | Admitting: Surgery

## 2017-07-12 ENCOUNTER — Other Ambulatory Visit: Payer: Self-pay | Admitting: Family Medicine

## 2017-07-12 DIAGNOSIS — Z1231 Encounter for screening mammogram for malignant neoplasm of breast: Secondary | ICD-10-CM

## 2017-07-23 DIAGNOSIS — K219 Gastro-esophageal reflux disease without esophagitis: Secondary | ICD-10-CM | POA: Diagnosis not present

## 2017-07-23 DIAGNOSIS — Z0000 Encounter for general adult medical examination without abnormal findings: Secondary | ICD-10-CM | POA: Diagnosis not present

## 2017-07-23 DIAGNOSIS — N183 Chronic kidney disease, stage 3 (moderate): Secondary | ICD-10-CM | POA: Diagnosis not present

## 2017-07-23 DIAGNOSIS — M1990 Unspecified osteoarthritis, unspecified site: Secondary | ICD-10-CM | POA: Diagnosis not present

## 2017-07-23 DIAGNOSIS — G629 Polyneuropathy, unspecified: Secondary | ICD-10-CM | POA: Diagnosis not present

## 2017-07-23 DIAGNOSIS — I7300 Raynaud's syndrome without gangrene: Secondary | ICD-10-CM | POA: Diagnosis not present

## 2017-07-23 DIAGNOSIS — Z79899 Other long term (current) drug therapy: Secondary | ICD-10-CM | POA: Diagnosis not present

## 2017-07-23 DIAGNOSIS — E7800 Pure hypercholesterolemia, unspecified: Secondary | ICD-10-CM | POA: Diagnosis not present

## 2017-07-23 DIAGNOSIS — F419 Anxiety disorder, unspecified: Secondary | ICD-10-CM | POA: Diagnosis not present

## 2017-07-31 ENCOUNTER — Ambulatory Visit
Admission: RE | Admit: 2017-07-31 | Discharge: 2017-07-31 | Disposition: A | Discharge status: Home / Self Care | Payer: PPO | Source: Ambulatory Visit | Attending: Family Medicine | Admitting: Family Medicine

## 2017-07-31 DIAGNOSIS — Z1231 Encounter for screening mammogram for malignant neoplasm of breast: Secondary | ICD-10-CM

## 2017-08-01 ENCOUNTER — Other Ambulatory Visit: Payer: Self-pay | Admitting: Family Medicine

## 2017-08-01 DIAGNOSIS — R928 Other abnormal and inconclusive findings on diagnostic imaging of breast: Secondary | ICD-10-CM

## 2017-08-05 ENCOUNTER — Ambulatory Visit
Admission: RE | Admit: 2017-08-05 | Discharge: 2017-08-05 | Disposition: A | Discharge status: Home / Self Care | Payer: PPO | Source: Ambulatory Visit | Attending: Family Medicine | Admitting: Family Medicine

## 2017-08-05 ENCOUNTER — Other Ambulatory Visit: Payer: Self-pay | Admitting: Family Medicine

## 2017-08-05 DIAGNOSIS — R928 Other abnormal and inconclusive findings on diagnostic imaging of breast: Secondary | ICD-10-CM

## 2017-08-05 DIAGNOSIS — N6324 Unspecified lump in the left breast, lower inner quadrant: Secondary | ICD-10-CM | POA: Diagnosis not present

## 2017-08-06 DIAGNOSIS — L52 Erythema nodosum: Secondary | ICD-10-CM | POA: Diagnosis not present

## 2017-08-08 ENCOUNTER — Ambulatory Visit
Admission: RE | Admit: 2017-08-08 | Discharge: 2017-08-08 | Disposition: A | Discharge status: Home / Self Care | Payer: PPO | Source: Ambulatory Visit | Attending: Family Medicine | Admitting: Family Medicine

## 2017-08-08 ENCOUNTER — Other Ambulatory Visit: Payer: Self-pay | Admitting: Family Medicine

## 2017-08-08 DIAGNOSIS — R928 Other abnormal and inconclusive findings on diagnostic imaging of breast: Secondary | ICD-10-CM

## 2017-08-08 DIAGNOSIS — N6489 Other specified disorders of breast: Secondary | ICD-10-CM | POA: Diagnosis not present

## 2017-08-08 DIAGNOSIS — N6342 Unspecified lump in left breast, subareolar: Secondary | ICD-10-CM | POA: Diagnosis not present

## 2017-09-05 DIAGNOSIS — M25472 Effusion, left ankle: Secondary | ICD-10-CM | POA: Diagnosis not present

## 2017-09-05 DIAGNOSIS — E663 Overweight: Secondary | ICD-10-CM | POA: Diagnosis not present

## 2017-09-05 DIAGNOSIS — R9389 Abnormal findings on diagnostic imaging of other specified body structures: Secondary | ICD-10-CM | POA: Diagnosis not present

## 2017-09-05 DIAGNOSIS — I7300 Raynaud's syndrome without gangrene: Secondary | ICD-10-CM | POA: Diagnosis not present

## 2017-09-05 DIAGNOSIS — L52 Erythema nodosum: Secondary | ICD-10-CM | POA: Diagnosis not present

## 2017-09-05 DIAGNOSIS — R590 Localized enlarged lymph nodes: Secondary | ICD-10-CM | POA: Diagnosis not present

## 2017-09-05 DIAGNOSIS — M25471 Effusion, right ankle: Secondary | ICD-10-CM | POA: Diagnosis not present

## 2017-09-05 DIAGNOSIS — Z6829 Body mass index (BMI) 29.0-29.9, adult: Secondary | ICD-10-CM | POA: Diagnosis not present

## 2017-09-13 ENCOUNTER — Other Ambulatory Visit: Payer: Self-pay | Admitting: Physician Assistant

## 2017-09-13 DIAGNOSIS — R9389 Abnormal findings on diagnostic imaging of other specified body structures: Secondary | ICD-10-CM

## 2017-09-19 ENCOUNTER — Ambulatory Visit
Admission: RE | Admit: 2017-09-19 | Discharge: 2017-09-19 | Disposition: A | Discharge status: Home / Self Care | Payer: PPO | Source: Ambulatory Visit | Attending: Physician Assistant | Admitting: Physician Assistant

## 2017-09-19 DIAGNOSIS — R9389 Abnormal findings on diagnostic imaging of other specified body structures: Secondary | ICD-10-CM

## 2017-09-19 DIAGNOSIS — R918 Other nonspecific abnormal finding of lung field: Secondary | ICD-10-CM | POA: Diagnosis not present

## 2017-09-19 MED ORDER — IOPAMIDOL (ISOVUE-300) INJECTION 61%
60.0000 mL | Freq: Once | INTRAVENOUS | Status: AC | PRN
  Administered 2017-09-19: 60 mL via INTRAVENOUS

## 2017-09-25 DIAGNOSIS — R9389 Abnormal findings on diagnostic imaging of other specified body structures: Secondary | ICD-10-CM | POA: Diagnosis not present

## 2017-09-25 DIAGNOSIS — L52 Erythema nodosum: Secondary | ICD-10-CM | POA: Diagnosis not present

## 2017-09-25 DIAGNOSIS — Z6829 Body mass index (BMI) 29.0-29.9, adult: Secondary | ICD-10-CM | POA: Diagnosis not present

## 2017-09-25 DIAGNOSIS — R590 Localized enlarged lymph nodes: Secondary | ICD-10-CM | POA: Diagnosis not present

## 2017-09-25 DIAGNOSIS — I7300 Raynaud's syndrome without gangrene: Secondary | ICD-10-CM | POA: Diagnosis not present

## 2017-09-25 DIAGNOSIS — M25472 Effusion, left ankle: Secondary | ICD-10-CM | POA: Diagnosis not present

## 2017-09-25 DIAGNOSIS — M25471 Effusion, right ankle: Secondary | ICD-10-CM | POA: Diagnosis not present

## 2017-09-25 DIAGNOSIS — E663 Overweight: Secondary | ICD-10-CM | POA: Diagnosis not present

## 2017-10-10 ENCOUNTER — Telehealth: Payer: Self-pay | Admitting: Internal Medicine

## 2017-10-10 NOTE — Telephone Encounter (Signed)
[facility]- received recording stating that office is currently closed.   Will call back on 10/10/17

## 2017-10-11 NOTE — Telephone Encounter (Signed)
Tried to call Caryl Pina with [facility] at [phone] office is closed on Friday's after 12pm,X1

## 2017-10-14 NOTE — Telephone Encounter (Signed)
Alexis Murphy returning call, [phone] ext [phone].  States may leave detailed msg on her VM if cannot reach her.

## 2017-10-14 NOTE — Telephone Encounter (Signed)
Attempted to call Caryl Pina back and was transferred to her voicemail. Message left for her to call back.

## 2017-10-14 NOTE — Telephone Encounter (Signed)
Attempted to call Alexis Murphy at [facility] but unable to reach her.  Left a detailed message for Alexis Murphy stating we do require 25min time slots for our consult appts and her appt that is currently scheduled with Dr. Melvyn Novas on 10/29/17 is the soonest 52min slot we could get her in to see him at.  Also stated in my message if they thought she needed to be seen sooner than that day, we could check our other doctors' schedules to see if anyone had anything sooner that.  Nothing needed at this time.

## 2017-10-29 ENCOUNTER — Ambulatory Visit: Payer: PPO | Admitting: Internal Medicine

## 2017-10-29 ENCOUNTER — Encounter: Payer: Self-pay | Admitting: Internal Medicine

## 2017-10-29 ENCOUNTER — Other Ambulatory Visit (INDEPENDENT_AMBULATORY_CARE_PROVIDER_SITE_OTHER): Payer: PPO

## 2017-10-29 ENCOUNTER — Ambulatory Visit (INDEPENDENT_AMBULATORY_CARE_PROVIDER_SITE_OTHER)
Admission: RE | Admit: 2017-10-29 | Discharge: 2017-10-29 | Disposition: A | Discharge status: Home / Self Care | Payer: PPO | Source: Ambulatory Visit | Attending: Internal Medicine | Admitting: Internal Medicine

## 2017-10-29 VITALS — BP 124/80 | HR 103 | Ht 60.25 in | Wt 155.0 lb

## 2017-10-29 DIAGNOSIS — R918 Other nonspecific abnormal finding of lung field: Secondary | ICD-10-CM | POA: Diagnosis not present

## 2017-10-29 DIAGNOSIS — D869 Sarcoidosis, unspecified: Secondary | ICD-10-CM

## 2017-10-29 LAB — HEPATIC FUNCTION PANEL
ALBUMIN: 4.4 g/dL (ref 3.5–5.2)
ALK PHOS: 96 U/L (ref 39–117)
ALT: 14 U/L (ref 0–35)
AST: 19 U/L (ref 0–37)
Bilirubin, Direct: 0.1 mg/dL (ref 0.0–0.3)
Total Bilirubin: 0.4 mg/dL (ref 0.2–1.2)
Total Protein: 7.3 g/dL (ref 6.0–8.3)

## 2017-10-29 LAB — SEDIMENTATION RATE: Sed Rate: 39 mm/hr — ABNORMAL HIGH (ref 0–30)

## 2017-10-29 MED ORDER — HYDROXYCHLOROQUINE SULFATE 200 MG PO TABS
200.0000 mg | ORAL_TABLET | Freq: Every day | ORAL | 11 refills | Status: DC
Start: 2017-10-29 — End: 2018-05-12

## 2017-10-29 NOTE — Progress Notes (Addendum)
Subjective:     Patient ID: Alexis Murphy, female   DOB: [birth year],    MRN: [id number]  HPI  68 yowf quit smoking 2009 with new onset toe/foot/ anke pain around 2010 then L breast masses :  granuloma on bx 07/2015 and  L breast  nearby in 08/08/17 c/w NCG's  And then 2/ 2019  lumps both legs R> L >>> Alexis Murphy referred to Alexis Murphy who felt dx likely was likely sarcoid  10/29/2017 1st Alexis Murphy office visit/ Alexis Murphy   Chief Complaint  Patient presents with  . Murphy Consult    Referred by Alexis Chimes, PA. Pt states that she was dxed with Sarcoid recently. She denies any respiratory co's.   main concerns continue to be aches in ankles and feet with new tender nodules c/w EN over R > L legs and pattern has been pauciarticular/ migratory since onset around 2010 , never rx with steroids to her knowledge   Not limited by breathing from desired activities    No obvious day to day or daytime variability or assoc excess/ purulent sputum or mucus plugs or hemoptysis or cp or chest tightness, subjective wheeze or overt sinus or hb symptoms. No unusual exposure hx or h/o childhood pna/ asthma or knowledge of premature birth.  Sleeping  Ok   without nocturnal  or early am exacerbation  of respiratory  c/o's or need for noct saba. Also denies any obvious fluctuation of symptoms with weather or environmental changes or other aggravating or alleviating factors except as outlined above   Current Allergies, Complete Past Medical History, Past Surgical History, Family History, and Social History were reviewed in [employer] record.  ROS  The following are not active complaints unless bolded Hoarseness, sore throat, dysphagia, dental problems, itching, sneezing,  nasal congestion or discharge of excess mucus or purulent secretions, ear ache,   fever, chills, sweats, unintended wt loss or wt gain, classically pleuritic or exertional cp,  orthopnea pnd or arm/hand swelling  or leg swelling,  presyncope, palpitations, abdominal pain, anorexia, nausea, vomiting, diarrhea  or change in bowel habits or change in bladder habits, change in stools or change in urine, dysuria, hematuria,  rash, arthralgias, visual complaints, headache, numbness, weakness or ataxia or problems with walking or coordination,  change in mood or  memory.        Current Meds  Medication Sig  . acetaminophen (TYLENOL) 325 MG tablet Take 1 tablet (325 mg total) by mouth every 6 (six) hours.  Marland Kitchen amLODipine (NORVASC) 5 MG tablet Take 5 mg by mouth every evening.   Marland Kitchen atorvastatin (LIPITOR) 40 MG tablet Take 40 mg by mouth every evening.   . Melatonin 5 MG TABS Take 5 mg by mouth at bedtime.  Marland Kitchen omeprazole (PRILOSEC) 40 MG capsule Take 40 mg by mouth every evening.   . venlafaxine XR (EFFEXOR-XR) 150 MG 24 hr capsule Take 150 mg by mouth every evening.         Review of Systems     Objective:   Physical Exam  amb pleasant verbose wf nad  Wt Readings from Last 3 Encounters:  10/29/17 155 lb (70.3 kg)  03/10/17 150 lb (68 kg)  12/17/16 155 lb (70.3 kg)     Vital signs reviewed - Note on arrival 02 sats  98% on RA     HEENT: nl dentition, turbinates bilaterally, and oropharynx. Nl external ear canals without cough reflex   NECK :  without JVD/Nodes/TM/ nl carotid  upstrokes bilaterally   LUNGS: no acc muscle use,  Nl contour chest which is clear to A and P bilaterally without cough on insp or exp maneuvers   CV:  RRR  no s3 or murmur or increase in P2, and no edema   ABD:  soft and nontender with nl inspiratory excursion in the supine position. No bruits or organomegaly appreciated, bowel sounds nl  MS:  Nl gait/ ext warm without deformities, calf tenderness, cyanosis or clubbing No obvious joint restrictions   SKIN: warm and dry with classic EN R > leg/ and purplish MP R nose   NEURO:  alert, approp, nl sensorium with  no motor or cerebellar deficits apparent.      CT chest 09/19/17 ( reviewed  10/29/2017 by me) 1. Moderate mediastinal and right supraclavicular lymphadenopathy, increased. New mild bilateral axillary and bilateral hilar lymphadenopathy. Findings are worrisome for a malignant process such as a lymphoproliferative disorder or metastatic nodal disease. The right supraclavicular node may be (but not definitely) accessible for percutaneous biopsy. 2. Numerous similar-appearing subsolid spiculated Murphy nodules scattered throughout both lungs, upper lobe predominant, largest 1.6 cm, stable to mildly increased in size and slightly increased in density in the interval. These are indeterminate, with multifocal lung adenocarcinoma on the differential. 3. Several new subcentimeter hypodense splenic lesions. Solitary small hypodense posterior right liver lobe lesion. These lesions are indeterminate, with lymphoproliferative disorder or metastatic disease not excluded.    CXR PA and Lateral:   10/29/2017 :    I personally reviewed images and impression as follows:   Mild RN changes/ Mild hilar adenopathy/ mild T kyphosis     Labs sent 10/29/2017  = lfts/ esr      Assessment:

## 2017-10-29 NOTE — Patient Instructions (Addendum)
Sarcoidosis is a benign inflammatory condition caused by  The  immune system being too revved up like a thermostat on your furnace that's partially  stuck causing arthitis, rash, short of breath and cough and vision issues.  It typically burns itself out in 75% of patients by the end of 3 years with little to indicate that we really change the natural course of the disease by aggressive treatments intended to alter it.  Treatment is generally reserved for patients with major symptoms we can attribute to sarcoid or if a vital organ (like the eye or kidney or nervous system) becomes affected.     Please remember to go to the lab and x-ray department downstairs in the basement  for your tests - we will call you with the results when they are available.        Plaquenil 200 mg daily until rheumatology re-evaluation    Please schedule a follow up office visit in 6 weeks, call sooner if needed with pfts

## 2017-10-30 ENCOUNTER — Encounter: Payer: Self-pay | Admitting: Internal Medicine

## 2017-10-30 NOTE — Progress Notes (Signed)
LMTCB

## 2017-10-30 NOTE — Assessment & Plan Note (Signed)
Onset of symptoms ? Around 2010 (pt gives variable hx) foot then ankle pains always R > L  - Multiple breast bx's c/w granuomas and NCG the 2017 and 08/08/2017  - EN onset  08/2017 - ESR 10/29/2017 = 39  With nl lfts > rec plaquenil trial 200 mg daily    This is a very unusual case that is perfect for sarcoid x for the asymmetry of the arthritis and the late onset in the illness of the EN and the age/ race of the pt but clearly this is sarcoid and does not need at lung bx to prove it, esp if responds to treatment directed at sarcoid as apparently has never been on any form of immunotherapy before    Reviewed the etiology and natural hx with pt and in the chronic from of the dz rec starting with the least toxic option when skin/bone involvement so prominent : plaquenil titrated quickly up to 400 mg per day if lfts tolerate - will also f/u with rheum and return here for pfts baseline in 6 weeks  Discussed in detail all the  indications, usual  risks and alternatives  relative to the benefits with patient who agrees to proceed with rx as outlined    Total time devoted to counseling  > 50 % of initial 60 min office visit:  review case with pt/ discussion of options/alternatives/ personally creating written customized instructions  in presence of pt  then going over those specific  Instructions directly with the pt including how to use all of the meds but in particular covering each new medication in detail and the difference between the maintenance= "automatic" meds and the prns using an action plan format for the latter (If this problem/symptom => do that organization reading Left to right).  Please see AVS from this visit for a full list of these instructions which I personally wrote for this pt and  are unique to this visit.

## 2017-10-31 LAB — QUANTIFERON-TB GOLD PLUS
Mitogen-NIL: 4.19 IU/mL
NIL: 0.04 [IU]/mL
QUANTIFERON-TB GOLD PLUS: NEGATIVE
TB1-NIL: 0.01 IU/mL
TB2-NIL: <0 IU/mL

## 2017-11-04 ENCOUNTER — Telehealth: Payer: Self-pay | Admitting: Internal Medicine

## 2017-11-04 NOTE — Telephone Encounter (Signed)
Alexis Murphy, the office is closed. They closed at 4:30. Left message to give [place] a call back.

## 2017-11-05 DIAGNOSIS — M25471 Effusion, right ankle: Secondary | ICD-10-CM | POA: Diagnosis not present

## 2017-11-05 DIAGNOSIS — M25472 Effusion, left ankle: Secondary | ICD-10-CM | POA: Diagnosis not present

## 2017-11-05 DIAGNOSIS — D869 Sarcoidosis, unspecified: Secondary | ICD-10-CM | POA: Diagnosis not present

## 2017-11-05 DIAGNOSIS — L52 Erythema nodosum: Secondary | ICD-10-CM | POA: Diagnosis not present

## 2017-11-05 DIAGNOSIS — R9389 Abnormal findings on diagnostic imaging of other specified body structures: Secondary | ICD-10-CM | POA: Diagnosis not present

## 2017-11-05 DIAGNOSIS — E663 Overweight: Secondary | ICD-10-CM | POA: Diagnosis not present

## 2017-11-05 DIAGNOSIS — I7300 Raynaud's syndrome without gangrene: Secondary | ICD-10-CM | POA: Diagnosis not present

## 2017-11-05 DIAGNOSIS — Z6828 Body mass index (BMI) 28.0-28.9, adult: Secondary | ICD-10-CM | POA: Diagnosis not present

## 2017-11-05 DIAGNOSIS — R590 Localized enlarged lymph nodes: Secondary | ICD-10-CM | POA: Diagnosis not present

## 2017-11-05 NOTE — Telephone Encounter (Signed)
ATC [name], no answer. Left message for her to call back.

## 2017-11-05 NOTE — Telephone Encounter (Signed)
Spoke with Alexis Murphy, she states Junie Panning thinks her diagnosis is sarcoidosis. I was concerned about the mention of  Spiculated densities on her CT scan,, atypical granuloma disease. She notes she is a smoker and lung cancer is a concerning possibility..  Does she need further imaging or biopsy? Please review Dr. Melvyn Novas

## 2017-11-05 NOTE — Telephone Encounter (Signed)
[facility], requesting call back. CB is [phone] opt 6, then option for nurse.

## 2017-11-05 NOTE — Telephone Encounter (Signed)
Alexis Murphy [facility] [phone] opt 6, option nurse) returned phone call, Alexis Murphy is seeing patients this morning, ok to leave message with Alexis Murphy or on the voicemail (voicemail is secure).Marland KitchenMarland Kitchen

## 2017-11-05 NOTE — Telephone Encounter (Signed)
Discussed with [name], will follow up the nodules as per ov

## 2017-11-05 NOTE — Telephone Encounter (Signed)
Called and spoke to Alexis Murphy. She was unclear of what Alexis Murphy needs. She is going to send her a message and then get back with [place].

## 2017-11-19 DIAGNOSIS — L308 Other specified dermatitis: Secondary | ICD-10-CM | POA: Diagnosis not present

## 2017-11-19 DIAGNOSIS — L309 Dermatitis, unspecified: Secondary | ICD-10-CM | POA: Diagnosis not present

## 2017-11-25 ENCOUNTER — Telehealth: Payer: Self-pay | Admitting: Hematology

## 2017-11-25 NOTE — Telephone Encounter (Signed)
Referral received from Dr. Melvyn Novas at [facility] for a dx of possible lymphoma per Hinton Dyer. Tc made to the pt to schedule her to see Dr. Irene Limbo on 5/23 at 11am. Pt was unaware of the appt. Advised to call Dr. Gustavus Bryant office for further explanation. Pt has agreed to the scheduled appt with Dr. Irene Limbo. Aware to arrive 30 minutes early.

## 2017-11-27 NOTE — Progress Notes (Signed)
HEMATOLOGY/ONCOLOGY CONSULTATION NOTE  Date of Service: 11/28/17  Patient Care Team: Lawerance Cruel, MD as PCP - General (Family Medicine)  CHIEF COMPLAINTS/PURPOSE OF CONSULTATION:  Concern for Lymphoma  HISTORY OF PRESENTING ILLNESS:   Alexis Murphy is a wonderful 72 y.o. 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 The pt reports that she is doing well overall. She has seen dermatology and rheumatology with work up of her sarcoidosis. She is seeing Dr Christinia Gully in one month in Pulmonology.   She notes that her skin changes first appeared 10 years ago.   The pt reports that her recent nose bx with [facility] a week ago revealed sarcoidosis and her right foot bx revealed stasis dermatitis. She has been diagnosed with sarcoidosis before this. She began a 20mg  prednisone 41-month-taper, three weeks ago with Dr. [name], and is also taking Plaquenil for her sarcoidosis. She had a CT on 09/19/17 to properly work up her sarcoidosis diagnosis.   She notes that prior to taking steroids her feet were very painful and also had some neuropathy. She also had night sweats and leg and ankle swelling but did not have fatigue or shortness of breath. These positive symptoms have resolved after beginning Prednisone.   She denies any breast symptoms. She has a MMG on 08/05/2017 which showed : IMPRESSION: New suspicious left breast mass 7:00 position retroareolar measuring 0.6 cm on ultrasound. Malignancy cannot be excluded. The mass does have similar imaging appearance to the previously biopsied left breast mass (January 2017, which demonstrated granulomatous inflammation and no evidence of malignancy at pathology.) The possibility of a second area of granulomatous inflammation is considered.  Borderline diffuse cortical thickening of a left axillary lymph node. On review of the patient's prior CT a of the  chest, patient does have mild diffuse lymphadenopathy in the thorax. At this time, axillary lymph node biopsy is not felt warranted.  The patient was given a copy of her pathology report from her left breast biopsy from January 2017. I told her she may want to share this information with her rheumatologist.  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   Of note prior to the patient's visit today, pt has had CT Chest completed on 09/19/17 with results revealing 1. Moderate mediastinal and right supraclavicular lymphadenopathy, increased. New mild bilateral axillary and bilateral hilar lymphadenopathy. Findings are worrisome for a malignant process such as a lymphoproliferative disorder or metastatic nodal disease. The right supraclavicular node may be (but not definitely) accessible for percutaneous biopsy. 2. Numerous similar-appearing subsolid spiculated pulmonary nodules scattered throughout both lungs, upper lobe predominant, largest 1.6 cm, stable to mildly increased in size and slightly increased in density in the interval. These are indeterminate, with multifocal lung adenocarcinoma on the differential. 3. Several new subcentimeter hypodense splenic lesions. Solitary small hypodense posterior right liver lobe lesion. These lesions are indeterminate, with lymphoproliferative disorder or metastatic disease not excluded. Options include further characterization with MRI abdomen without and with IV contrast or short-term follow-up CT abdomen with IV contrast in 3 months. 4. Chronic findings include: Left main and 3 vessel coronary atherosclerosis. Small hiatal hernia. Cholelithiasis.   Most recent lab results (03/10/17) of CBC  is as follows: all values are WNL except for WBC at 17.5k, MCV at 75.8, MCH at 25.2, RDW at 17.6, Platelets at 804k.  On review of systems, pt reports dry mouth, decreased leg  swelling, and denies tingling, numbness in her hands or feet, dry eyes,  problems breathing, cough, SOB, CP, nose bleeds, fatigue, abdominal pains, unexpected weight loss, fevers, chills, night sweats.   On PMHx the pt reports sarcoidosis, Bell's palsy twice.  On Social Hx the pt reports that she smoked cigarettes for 45 years. She quit smoking cigarettes 10 years ago but continues to smoke marijuana occasionally.  On Family Hx the pt reports maternal lupus.   MEDICAL HISTORY:  Past Medical History:  Diagnosis Date  . Arthritis   . Hyperlipidemia     SURGICAL HISTORY: History reviewed. No pertinent surgical history.  SOCIAL HISTORY: Social History   Socioeconomic History  . Marital status: Widowed    Spouse name: Not on file  . Number of children: Not on file  . Years of education: Not on file  . Highest education level: Not on file  Occupational History  . Not on file  Social Needs  . Financial resource strain: Not on file  . Food insecurity:    Worry: Not on file    Inability: Not on file  . Transportation needs:    Medical: Not on file    Non-medical: Not on file  Tobacco Use  . Smoking status: Former Smoker    Packs/day: 1.00    Years: 45.00    Pack years: 45.00    Types: Cigarettes    Last attempt to quit: 10/15/2007    Years since quitting: 10.1  . Smokeless tobacco: Never Used  Substance and Sexual Activity  . Alcohol use: Yes  . Drug use: No  . Sexual activity: Not on file  Lifestyle  . Physical activity:    Days per week: Not on file    Minutes per session: Not on file  . Stress: Not on file  Relationships  . Social connections:    Talks on phone: Not on file    Gets together: Not on file    Attends religious service: Not on file    Active member of club or organization: Not on file    Attends meetings of clubs or organizations: Not on file    Relationship status: Not on file  . Intimate partner violence:    Fear of current or ex partner: Not on file    Emotionally abused: Not on file    Physically abused: Not on  file    Forced sexual activity: Not on file  Other Topics Concern  . Not on file  Social History Narrative  . Not on file    FAMILY HISTORY: Family History  Problem Relation Age of Onset  . Heart disease Father   . Heart disease Brother     ALLERGIES:  is allergic to codeine and penicillins.  MEDICATIONS:  Current Outpatient Medications  Medication Sig Dispense Refill  . acetaminophen (TYLENOL) 325 MG tablet Take 1 tablet (325 mg total) by mouth every 6 (six) hours. 30 tablet 0  . amLODipine (NORVASC) 5 MG tablet Take 5 mg by mouth every evening.     Marland Kitchen atorvastatin (LIPITOR) 40 MG tablet Take 40 mg by mouth every evening.     . hydroxychloroquine (PLAQUENIL) 200 MG tablet Take 1 tablet (200 mg total) by mouth daily. 30 tablet 11  . Melatonin 5 MG TABS Take 5 mg by mouth at bedtime.    Marland Kitchen omeprazole (PRILOSEC) 40 MG capsule Take 40 mg by mouth every evening.     . venlafaxine XR (EFFEXOR-XR) 150 MG 24 hr capsule  Take 150 mg by mouth every evening.      No current facility-administered medications for this visit.     REVIEW OF SYSTEMS:    10 Point review of Systems was done is negative except as noted above.  PHYSICAL EXAMINATION: ECOG PERFORMANCE STATUS: 2 - Symptomatic, <50% confined to bed  . Vitals:   11/28/17 1111  BP: (!) 162/70  Pulse: 98  Resp: 18  Temp: 98.1 F (36.7 C)  SpO2: (!) 87%   Filed Weights   11/28/17 1111  Weight: 155 lb 9.6 oz (70.6 kg)   .Body mass index is 30.14 kg/m.  GENERAL:alert, in no acute distress and comfortable SKIN: no acute rashes, no significant lesions EYES: conjunctiva are pink and non-injected, sclera anicteric OROPHARYNX: MMM, no exudates, no oropharyngeal erythema or ulceration NECK: supple, no JVD LYMPH:  no overt palpable lymphadenopathy in the cervical, axillary or inguinal regions LUNGS: clear to auscultation b/l with normal respiratory effort HEART: regular rate & rhythm ABDOMEN:  normoactive bowel sounds , non  tender, not distended. Extremity: no pedal edema PSYCH: alert & oriented x 3 with fluent speech NEURO: no focal motor/sensory deficits  LABORATORY DATA:  I have reviewed the data as listed  . CBC Latest Ref Rng & Units 11/28/2017 03/10/2017  WBC 3.9 - 10.3 K/uL 12.8(H) 17.5(H)  Hemoglobin 11.6 - 15.9 g/dL 12.3 12.5  Hematocrit 34.8 - 46.6 % 38.4 37.6  Platelets 145 - 400 K/uL 457(H) 804(H)    . CMP Latest Ref Rng & Units 11/28/2017 10/29/2017 03/10/2017  Glucose 70 - 140 mg/dL 119 - 99  BUN 7 - 26 mg/dL 23 - 15  Creatinine 0.60 - 1.10 mg/dL 1.20(H) - 1.26(H)  Sodium 136 - 145 mmol/L 137 - 135  Potassium 3.5 - 5.1 mmol/L 4.4 - 3.5  Chloride 98 - 109 mmol/L 103 - 100(L)  CO2 22 - 29 mmol/L 25 - 21(L)  Calcium 8.4 - 10.4 mg/dL 9.4 - 9.3  Total Protein 6.4 - 8.3 g/dL 7.7 7.3 -  Total Bilirubin 0.2 - 1.2 mg/dL 0.4 0.4 -  Alkaline Phos 40 - 150 U/L 86 96 -  AST 5 - 34 U/L 17 19 -  ALT 0 - 55 U/L 18 14 -   Component     Latest Ref Rng & Units 11/28/2017  Iron     41 - 142 ug/dL 37 (L)  TIBC     236 - 444 ug/dL 399  Saturation Ratios     21 - 57 % 9 (L)  UIBC     ug/dL 362  Retic Ct Pct     0.7 - 2.1 % 1.7  RBC.     3.70 - 5.45 MIL/uL 4.89  Retic Count, Absolute     33.7 - 90.7 K/uL 83.1  Ferritin     9 - 269 ng/mL 12  Angiotensin-Converting Enzyme     14 - 82 U/L 70  Sed Rate     0 - 22 mm/hr 12  LDH     125 - 245 U/L 302 (H)    RADIOGRAPHIC STUDIES: I have personally reviewed the radiological images as listed and agreed with the findings in the report. Dg Chest 2 View  Result Date: 10/30/2017 CLINICAL DATA:  Sarcoidosis EXAM: CHEST - 2 VIEW COMPARISON:  Chest CT 09/19/2017 FINDINGS: Normal heart size. Stable aortic contours. There is right paratracheal adenopathy that is known. Small hiatal hernia. Chronic interstitial coarsening. Nodular densities seen on comparison CT are under appreciated by  radiography. There is no edema, acute consolidation, effusion, or  pneumothorax. IMPRESSION: 1. Thoracic adenopathy and lung nodules that are under appreciated relative to 09/19/2017 chest CT. 2. No new or acute finding. 3. Electronically Signed   By: Monte Fantasia M.D.   On: 10/30/2017 09:12   CT chest 09/19/2017: IMPRESSION: 1. Moderate mediastinal and right supraclavicular lymphadenopathy, increased. New mild bilateral axillary and bilateral hilar lymphadenopathy. Findings are worrisome for a malignant process such as a lymphoproliferative disorder or metastatic nodal disease. The right supraclavicular node may be (but not definitely) accessible for percutaneous biopsy. 2. Numerous similar-appearing subsolid spiculated pulmonary nodules scattered throughout both lungs, upper lobe predominant, largest 1.6 cm, stable to mildly increased in size and slightly increased in density in the interval. These are indeterminate, with multifocal lung adenocarcinoma on the differential. 3. Several new subcentimeter hypodense splenic lesions. Solitary small hypodense posterior right liver lobe lesion. These lesions are indeterminate, with lymphoproliferative disorder or metastatic disease not excluded. Options include further characterization with MRI abdomen without and with IV contrast or short-term follow-up CT abdomen with IV contrast in 3 months. 4. Chronic findings include: Left main and 3 vessel coronary atherosclerosis. Small hiatal hernia. Cholelithiasis.  Aortic Atherosclerosis (ICD10-I70.0).  Electronically Signed   By: Ilona Sorrel M.D.   On: 09/19/2017 11:54   ASSESSMENT & PLAN:  72 y.o. female with  1. Generalized lymphadenopathy with multiple pulmoanry nodules Ct chest 09/19/2017: Moderate mediastinal and right supraclavicular lymphadenopathy, increased. New mild bilateral axillary and bilateral hilar lymphadenopathy. Findings are worrisome for a malignant process such as a lymphoproliferative disorder or metastatic nodal disease.  The right supraclavicular node may be (but not definitely) accessible for percutaneous biopsy. 2. Numerous similar-appearing subsolid spiculated pulmonary nodules scattered throughout both lungs, upper lobe predominant, largest 1.6 cm, stable to mildly increased in size and slightly increased in density in the interval. These are indeterminate, with multifocal lung adenocarcinoma on the differential.  2. Breast - lesion - Bx -- granulomatous inflammation -consistent with sarcoidosis. 3. Skin rash on nose - Bx consistent with sarcoidosis.  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 -although the LNadneopathy (clinically improved on steroids) and pulmonary nodules also likely related to sarcoidosis however will need to confirm this with LN or pulmonary nodule bx if still persistent. -patient symptoms have improved with prednisone and plaquenil that she is on from the the rheumatologist. --Repeat CT chest/abd/pelvis in one month, which would be 2 months after she began treatment to evaluate if the 09/19/17 CT Chest observed lung nodules and LNadenopathy have resolved or decreased in size with 2 months of steroids.  -Will see pt back in 5 weeks with labs and scan unless pt develops any new or concerning symptoms -Continue follow up with dermatology, rheumatology and pulmonology.   4. Iron deficiency - no significant anemia . Lab Results  Component Value Date   IRON 37 (L) 11/28/2017   TIBC 399 11/28/2017   IRONPCTSAT 9 (L) 11/28/2017   (Iron and TIBC)  Lab Results  Component Value Date   FERRITIN 12 11/28/2017   PLAN - GI w/u - will defer to PCP -?GI involvement by sarcoidosis.  5. 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 The patient knows to call the clinic with any problems, questions or concerns.  The toal time spent in the appt was 60 minutes and more than 50% was on counseling and direct patient cares.    Sullivan Lone MD Ashley AAHIVMS [facility] Hematology/Oncology Physician [facility]  (Office):       [phone] (Work cell):  [phone] (Fax):           [phone]  11/28/2017 11:05 AM  This document serves as a record of services personally performed by Sullivan Lone, MD. It was created on his behalf by Baldwin Jamaica, a trained medical scribe. The creation of this record is based on the scribe's personal observations and the provider's statements to them.   .I have reviewed the above documentation for accuracy and completeness, and I agree with the above. Brunetta Genera MD

## 2017-11-28 ENCOUNTER — Telehealth: Payer: Self-pay | Admitting: Hematology

## 2017-11-28 ENCOUNTER — Other Ambulatory Visit: Payer: Self-pay

## 2017-11-28 ENCOUNTER — Encounter: Payer: Self-pay | Admitting: Hematology

## 2017-11-28 ENCOUNTER — Inpatient Hospital Stay: Payer: PPO | Attending: Hematology | Admitting: Hematology

## 2017-11-28 ENCOUNTER — Inpatient Hospital Stay: Payer: PPO

## 2017-11-28 VITALS — BP 162/70 | HR 98 | Temp 98.1°F | Resp 18 | Ht 60.25 in | Wt 155.6 lb

## 2017-11-28 DIAGNOSIS — R918 Other nonspecific abnormal finding of lung field: Secondary | ICD-10-CM | POA: Diagnosis not present

## 2017-11-28 DIAGNOSIS — D75839 Thrombocytosis, unspecified: Secondary | ICD-10-CM

## 2017-11-28 DIAGNOSIS — D473 Essential (hemorrhagic) thrombocythemia: Secondary | ICD-10-CM

## 2017-11-28 DIAGNOSIS — D72829 Elevated white blood cell count, unspecified: Secondary | ICD-10-CM

## 2017-11-28 DIAGNOSIS — R591 Generalized enlarged lymph nodes: Secondary | ICD-10-CM

## 2017-11-28 DIAGNOSIS — R590 Localized enlarged lymph nodes: Secondary | ICD-10-CM | POA: Insufficient documentation

## 2017-11-28 DIAGNOSIS — M1990 Unspecified osteoarthritis, unspecified site: Secondary | ICD-10-CM | POA: Diagnosis not present

## 2017-11-28 DIAGNOSIS — Z87891 Personal history of nicotine dependence: Secondary | ICD-10-CM | POA: Insufficient documentation

## 2017-11-28 LAB — IRON AND TIBC
IRON: 37 ug/dL — AB (ref 41–142)
SATURATION RATIOS: 9 % — AB (ref 21–57)
TIBC: 399 ug/dL (ref 236–444)
UIBC: 362 ug/dL

## 2017-11-28 LAB — CMP (CANCER CENTER ONLY)
ALT: 18 U/L (ref 0–55)
AST: 17 U/L (ref 5–34)
Albumin: 4.5 g/dL (ref 3.5–5.0)
Alkaline Phosphatase: 86 U/L (ref 40–150)
Anion gap: 9 (ref 3–11)
BUN: 23 mg/dL (ref 7–26)
CHLORIDE: 103 mmol/L (ref 98–109)
CO2: 25 mmol/L (ref 22–29)
CREATININE: 1.2 mg/dL — AB (ref 0.60–1.10)
Calcium: 9.4 mg/dL (ref 8.4–10.4)
GFR, EST NON AFRICAN AMERICAN: 44 mL/min — AB (ref >60)
GFR, Est AFR Am: 51 mL/min — ABNORMAL LOW (ref >60)
Glucose, Bld: 119 mg/dL (ref 70–140)
Potassium: 4.4 mmol/L (ref 3.5–5.1)
Sodium: 137 mmol/L (ref 136–145)
Total Bilirubin: 0.4 mg/dL (ref 0.2–1.2)
Total Protein: 7.7 g/dL (ref 6.4–8.3)

## 2017-11-28 LAB — CBC WITH DIFFERENTIAL/PLATELET
Basophils Absolute: 0 10*3/uL (ref 0.0–0.1)
Basophils Relative: 0 %
EOS ABS: 0.1 10*3/uL (ref 0.0–0.5)
Eosinophils Relative: 1 %
HCT: 38.4 % (ref 34.8–46.6)
HEMOGLOBIN: 12.3 g/dL (ref 11.6–15.9)
LYMPHS ABS: 0.7 10*3/uL — AB (ref 0.9–3.3)
Lymphocytes Relative: 6 %
MCH: 25.2 pg (ref 25.1–34.0)
MCHC: 32 g/dL (ref 31.5–36.0)
MCV: 78.5 fL — AB (ref 79.5–101.0)
MONOS PCT: 3 %
Monocytes Absolute: 0.4 10*3/uL (ref 0.1–0.9)
NEUTROS PCT: 90 %
Neutro Abs: 11.6 10*3/uL — ABNORMAL HIGH (ref 1.5–6.5)
Platelets: 457 10*3/uL — ABNORMAL HIGH (ref 145–400)
RBC: 4.89 MIL/uL (ref 3.70–5.45)
RDW: 17.2 % — ABNORMAL HIGH (ref 11.2–14.5)
WBC: 12.8 10*3/uL — ABNORMAL HIGH (ref 3.9–10.3)

## 2017-11-28 LAB — SEDIMENTATION RATE: Sed Rate: 12 mm/hr (ref 0–22)

## 2017-11-28 LAB — RETICULOCYTES
RBC.: 4.89 MIL/uL (ref 3.70–5.45)
RETIC CT PCT: 1.7 % (ref 0.7–2.1)
Retic Count, Absolute: 83.1 10*3/uL (ref 33.7–90.7)

## 2017-11-28 LAB — LACTATE DEHYDROGENASE: LDH: 302 U/L — ABNORMAL HIGH (ref 125–245)

## 2017-11-28 LAB — FERRITIN: Ferritin: 12 ng/mL (ref 9–269)

## 2017-11-28 NOTE — Telephone Encounter (Signed)
Scheduled appt per 5/23 los - gave patient aVS and calender per los. - [facility] to contact patient with ct.

## 2017-11-30 LAB — ANGIOTENSIN CONVERTING ENZYME: Angiotensin-Converting Enzyme: 70 U/L (ref 14–82)

## 2017-12-10 ENCOUNTER — Ambulatory Visit: Payer: PPO | Admitting: Internal Medicine

## 2017-12-10 ENCOUNTER — Encounter: Payer: Self-pay | Admitting: Internal Medicine

## 2017-12-10 ENCOUNTER — Ambulatory Visit (INDEPENDENT_AMBULATORY_CARE_PROVIDER_SITE_OTHER): Payer: PPO | Admitting: Internal Medicine

## 2017-12-10 VITALS — BP 140/82 | HR 114 | Ht 64.0 in | Wt 152.0 lb

## 2017-12-10 DIAGNOSIS — D869 Sarcoidosis, unspecified: Secondary | ICD-10-CM

## 2017-12-10 LAB — PULMONARY FUNCTION TEST
DL/VA % PRED: 112 %
DL/VA: 5.11 ml/min/mmHg/L
DLCO UNC % PRED: 91 %
DLCO cor % pred: 94 %
DLCO cor: 20.48 ml/min/mmHg
DLCO unc: 19.75 ml/min/mmHg
FEF 25-75 Post: 2.71 L/sec
FEF 25-75 Pre: 2.53 L/sec
FEF2575-%CHANGE-POST: 6 %
FEF2575-%Pred-Post: 157 %
FEF2575-%Pred-Pre: 147 %
FEV1-%Change-Post: 1 %
FEV1-%PRED-PRE: 97 %
FEV1-%Pred-Post: 98 %
FEV1-POST: 2.02 L
FEV1-Pre: 1.98 L
FEV1FVC-%Change-Post: 0 %
FEV1FVC-%Pred-Pre: 113 %
FEV6-%CHANGE-POST: 0 %
FEV6-%Pred-Post: 90 %
FEV6-%Pred-Pre: 89 %
FEV6-PRE: 2.31 L
FEV6-Post: 2.32 L
FEV6FVC-%PRED-PRE: 105 %
FEV6FVC-%Pred-Post: 105 %
FVC-%Change-Post: 0 %
FVC-%Pred-Post: 85 %
FVC-%Pred-Pre: 85 %
FVC-Post: 2.32 L
FVC-Pre: 2.31 L
POST FEV1/FVC RATIO: 87 %
PRE FEV1/FVC RATIO: 86 %
Post FEV6/FVC ratio: 100 %
Pre FEV6/FVC Ratio: 100 %
RV % pred: 58 %
RV: 1.23 L
TLC % PRED: 82 %
TLC: 3.93 L

## 2017-12-10 NOTE — Progress Notes (Signed)
PFT done today. 

## 2017-12-10 NOTE — Patient Instructions (Addendum)
Plaquenil should be refilled thru [facility] and return here as needed if you develop shortness or breath, cough.   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

## 2017-12-10 NOTE — Progress Notes (Signed)
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    History of Present Illness  10/29/2017 1st [facility] office visit/ Freddie Nghiem   Chief Complaint  Patient presents with  . Pulmonary Consult    Referred by [name], PA. Pt states that she was dxed with Sarcoid recently. She denies any respiratory co's.   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 Follow-up    breathing unchanged. no complaints.  Dyspnea:  Not limited by breathing from desired activities   Cough: not as issue Sleep: no resp symptoms   Delman Cheadle is derm does not have access to epic and questioned need for lung bx  Dr Irene Limbo followiing as well and has set up serial ct chest as cxr is wnl  [facility] rx with pred starting at 20mg  and tapering  In meantime arthritis resolved,  EN gone and only rash the remains is on her R nares externallly   Does have some tongue irritation/ dryness and set up eval with ent   No obvious day to day or daytime variability or assoc excess/ purulent sputum or mucus plugs or hemoptysis or cp or chest tightness, subjective wheeze or overt sinus or hb symptoms. No unusual exposure hx or h/o childhood pna/ asthma or knowledge of premature birth.  Sleeping  fine  without nocturnal  or early am exacerbation  of respiratory  c/o's or need for noct saba. Also  denies any obvious fluctuation of symptoms with weather or environmental changes or other aggravating or alleviating factors except as outlined above   Current Allergies, Complete Past Medical History, Past Surgical History, Family History, and Social History were reviewed in [employer] record.  ROS  The following are not active complaints unless bolded Hoarseness, sore throat, dysphagia, dental problems, itching, sneezing,  nasal congestion or discharge of excess mucus or purulent secretions, ear ache,   fever, chills, sweats, unintended wt loss or wt gain, classically pleuritic or exertional cp,  orthopnea pnd or arm/hand swelling  or leg swelling, presyncope, palpitations, abdominal pain, anorexia, nausea, vomiting, diarrhea  or change in bowel habits or change in bladder habits, change in stools or change in urine, dysuria, hematuria,  Rash improved overall  arthralgias, visual complaints, headache, numbness, weakness or ataxia or problems with walking or coordination,  change in mood or  memory.        Current Meds  Medication Sig  . acetaminophen (TYLENOL) 325 MG tablet Take 1 tablet (325 mg total) by mouth every 6 (six) hours.  Marland Kitchen amLODipine (NORVASC) 5 MG tablet Take 5 mg by mouth every evening.   Marland Kitchen atorvastatin (LIPITOR) 40 MG tablet Take 40 mg by mouth every evening.   Marland Kitchen FOLBIC 2.5-25-2  MG TABS tablet Take 1 tablet by mouth daily.  . hydroxychloroquine (PLAQUENIL) 200 MG tablet Take 1 tablet (200 mg total) by mouth daily.  . Melatonin 5 MG TABS Take 5 mg by mouth at bedtime.  Marland Kitchen omeprazole (PRILOSEC) 40 MG capsule Take 40 mg by mouth every evening.   . predniSONE (DELTASONE) 20 MG tablet Take 20 mg by mouth daily. Taking a weaning dose over 4 months.  . venlafaxine XR (EFFEXOR-XR) 150 MG 24 hr capsule Take 150 mg by mouth every evening.                       Objective:   Physical Exam  amb wf nad  12/10/2017          152   10/29/17 155 lb (70.3 kg)   03/10/17 150 lb (68 kg)  12/17/16 155 lb (70.3 kg)      Vital signs reviewed - Note on arrival 02 sats  96% on RA       HEENT: nl dentition, turbinates bilaterally, and oropharynx. Nl external ear canals without cough reflex   NECK :  without JVD/Nodes/TM/ nl carotid upstrokes bilaterally   LUNGS: no acc muscle use,  Nl contour chest which is clear to A and P bilaterally without cough on insp or exp maneuvers   CV:  RRR  no s3 or murmur or increase in P2, and no edema   ABD:  soft and nontender with nl inspiratory excursion in the supine position. No bruits or organomegaly appreciated, bowel sounds nl  MS:  Nl gait/ ext warm without deformities, calf tenderness, cyanosis or clubbing No obvious joint restrictions   SKIN: warm and dry with minimal maculopapular hyper pigmented rash R ext nares   NEURO:  alert, approp, nl sensorium with  no motor or cerebellar deficits apparent.              Assessment:

## 2017-12-11 ENCOUNTER — Encounter: Payer: Self-pay | Admitting: Internal Medicine

## 2017-12-11 NOTE — Assessment & Plan Note (Signed)
Onset of symptoms ? Around 2010 (pt gives variable hx) foot then ankle pains always R > L  - Multiple breast bx's c/w granuomas and NCG the 2017 and 08/08/2017  - EN onset  08/2017 - Quant GOLD TB  10/29/17  Neg  - ESR 10/29/2017 = 39  With nl lfts > rec plaquenil trial 200 mg daily > rheum added steroids - PFT's 12/10/2017 wnl   Clearly better and might consider titrating up plaquenil if can't titrate off prednisone but will need serial pfts and regular eye exams on plaquenil which she should refill per rheum and return here for respiratory complaints and keep up f/u with all her other specialists as indicated   Discussed in detail all the  indications, usual  risks and alternatives  relative to the benefits with patient who agrees to proceed with conservative f/u as outlined  With no lung bx indicated at this point   I had an extended discussion with the patient reviewing all relevant studies completed to date and  lasting 15 to 20 minutes of a 25 minute visit    Each maintenance medication was reviewed in detail including most importantly the difference between maintenance and prns and under what circumstances the prns are to be triggered using an action plan format that is not reflected in the computer generated alphabetically organized AVS.    Please see AVS for specific instructions unique to this visit that I personally wrote and verbalized to the the pt in detail and then reviewed with pt  by my nurse highlighting any  changes in therapy recommended at today's visit to their plan of care.

## 2017-12-26 ENCOUNTER — Ambulatory Visit (HOSPITAL_COMMUNITY)
Admission: RE | Admit: 2017-12-26 | Discharge: 2017-12-26 | Disposition: A | Discharge status: Home / Self Care | Payer: PPO | Source: Ambulatory Visit | Attending: Hematology | Admitting: Hematology

## 2017-12-26 ENCOUNTER — Encounter (HOSPITAL_COMMUNITY): Payer: Self-pay

## 2017-12-26 DIAGNOSIS — N6320 Unspecified lump in the left breast, unspecified quadrant: Secondary | ICD-10-CM | POA: Diagnosis not present

## 2017-12-26 DIAGNOSIS — D1803 Hemangioma of intra-abdominal structures: Secondary | ICD-10-CM | POA: Diagnosis not present

## 2017-12-26 DIAGNOSIS — I2510 Atherosclerotic heart disease of native coronary artery without angina pectoris: Secondary | ICD-10-CM | POA: Insufficient documentation

## 2017-12-26 DIAGNOSIS — J984 Other disorders of lung: Secondary | ICD-10-CM | POA: Diagnosis not present

## 2017-12-26 DIAGNOSIS — R590 Localized enlarged lymph nodes: Secondary | ICD-10-CM | POA: Diagnosis not present

## 2017-12-26 DIAGNOSIS — K449 Diaphragmatic hernia without obstruction or gangrene: Secondary | ICD-10-CM | POA: Insufficient documentation

## 2017-12-26 DIAGNOSIS — R161 Splenomegaly, not elsewhere classified: Secondary | ICD-10-CM | POA: Diagnosis not present

## 2017-12-26 DIAGNOSIS — D869 Sarcoidosis, unspecified: Secondary | ICD-10-CM | POA: Insufficient documentation

## 2017-12-26 MED ORDER — IOPAMIDOL (ISOVUE-300) INJECTION 61%
INTRAVENOUS | Status: AC
  Filled 2017-12-26: qty 100

## 2017-12-26 MED ORDER — IOPAMIDOL (ISOVUE-300) INJECTION 61%
100.0000 mL | Freq: Once | INTRAVENOUS | Status: AC | PRN
  Administered 2017-12-26: 100 mL via INTRAVENOUS

## 2017-12-30 DIAGNOSIS — D869 Sarcoidosis, unspecified: Secondary | ICD-10-CM | POA: Diagnosis not present

## 2017-12-30 DIAGNOSIS — L03119 Cellulitis of unspecified part of limb: Secondary | ICD-10-CM | POA: Diagnosis not present

## 2017-12-30 DIAGNOSIS — M25471 Effusion, right ankle: Secondary | ICD-10-CM | POA: Diagnosis not present

## 2017-12-30 DIAGNOSIS — E663 Overweight: Secondary | ICD-10-CM | POA: Diagnosis not present

## 2017-12-30 DIAGNOSIS — R9389 Abnormal findings on diagnostic imaging of other specified body structures: Secondary | ICD-10-CM | POA: Diagnosis not present

## 2017-12-30 DIAGNOSIS — L52 Erythema nodosum: Secondary | ICD-10-CM | POA: Diagnosis not present

## 2017-12-30 DIAGNOSIS — M25472 Effusion, left ankle: Secondary | ICD-10-CM | POA: Diagnosis not present

## 2017-12-30 DIAGNOSIS — R590 Localized enlarged lymph nodes: Secondary | ICD-10-CM | POA: Diagnosis not present

## 2017-12-30 DIAGNOSIS — I7300 Raynaud's syndrome without gangrene: Secondary | ICD-10-CM | POA: Diagnosis not present

## 2017-12-30 DIAGNOSIS — Z6829 Body mass index (BMI) 29.0-29.9, adult: Secondary | ICD-10-CM | POA: Diagnosis not present

## 2017-12-30 NOTE — Progress Notes (Signed)
HEMATOLOGY/ONCOLOGY CONSULTATION NOTE  Date of Service: 12/31/17  Patient Care Team: Lawerance Cruel, MD as PCP - General (Family Medicine)  CHIEF COMPLAINTS/PURPOSE OF CONSULTATION:  Concern for Lymphoma - generalized lymphadenopathy.  HISTORY OF PRESENTING ILLNESS:   Alexis Murphy is a wonderful 72 y.o. 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 The pt reports that she is doing well overall. She has seen dermatology and rheumatology with work up of her sarcoidosis. She is seeing Dr Christinia Gully in one month in Pulmonology.   She notes that her skin changes first appeared 10 years ago.   The pt reports that her recent nose bx with [facility] a week ago revealed sarcoidosis and her right foot bx revealed stasis dermatitis. She has been diagnosed with sarcoidosis before this. She began a 20mg  prednisone 39-month-taper, three weeks ago with Dr. [name], and is also taking Plaquenil for her sarcoidosis. She had a CT on 09/19/17 to properly work up her sarcoidosis diagnosis.   She notes that prior to taking steroids her feet were very painful and also had some neuropathy. She also had night sweats and leg and ankle swelling but did not have fatigue or shortness of breath. These positive symptoms have resolved after beginning Prednisone.   She denies any breast symptoms. She has a MMG on 08/05/2017 which showed : IMPRESSION: New suspicious left breast mass 7:00 position retroareolar measuring 0.6 cm on ultrasound. Malignancy cannot be excluded. The mass does have similar imaging appearance to the previously biopsied left breast mass (January 2017, which demonstrated granulomatous inflammation and no evidence of malignancy at pathology.) The possibility of a second area of granulomatous inflammation is considered.  Borderline diffuse cortical thickening of a left axillary lymph node. On review of  the patient's prior CT a of the chest, patient does have mild diffuse lymphadenopathy in the thorax. At this time, axillary lymph node biopsy is not felt warranted.  The patient was given a copy of her pathology report from her left breast biopsy from January 2017. I told her she may want to share this information with her rheumatologist.  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   Of note prior to the patient's visit today, pt has had CT Chest completed on 09/19/17 with results revealing 1. Moderate mediastinal and right supraclavicular lymphadenopathy, increased. New mild bilateral axillary and bilateral hilar lymphadenopathy. Findings are worrisome for a malignant process such as a lymphoproliferative disorder or metastatic nodal disease. The right supraclavicular node may be (but not definitely) accessible for percutaneous biopsy. 2. Numerous similar-appearing subsolid spiculated pulmonary nodules scattered throughout both lungs, upper lobe predominant, largest 1.6 cm, stable to mildly increased in size and slightly increased in density in the interval. These are indeterminate, with multifocal lung adenocarcinoma on the differential. 3. Several new subcentimeter hypodense splenic lesions. Solitary small hypodense posterior right liver lobe lesion. These lesions are indeterminate, with lymphoproliferative disorder or metastatic disease not excluded. Options include further characterization with MRI abdomen without and with IV contrast or short-term follow-up CT abdomen with IV contrast in 3 months. 4. Chronic findings include: Left main and 3 vessel coronary atherosclerosis. Small hiatal hernia. Cholelithiasis.   Most recent lab results (03/10/17) of CBC  is as follows: all values are WNL except for WBC at 17.5k, MCV at 75.8, MCH at 25.2, RDW at 17.6, Platelets at 804k.  On review of systems, pt reports dry  mouth, decreased leg swelling, and denies tingling, numbness in  her hands or feet, dry eyes, problems breathing, cough, SOB, CP, nose bleeds, fatigue, abdominal pains, unexpected weight loss, fevers, chills, night sweats.   On PMHx the pt reports sarcoidosis, Bell's palsy twice.  On Social Hx the pt reports that she smoked cigarettes for 45 years. She quit smoking cigarettes 10 years ago but continues to smoke marijuana occasionally.  On Family Hx the pt reports maternal lupus.  Interval History:   Alexis Murphy returns today regarding her concern for lymphoma. The patient's last visit with [place] was on 11/28/17. The pt reports that she is doing well overall.   The pt reports that she is currently on 15mg  Prednisone and continues Plaquenil. She notes no new concerns. She has seen Dr Christinia Gully in pulmonology again in the interim and reports being discharged from his care. She notes that she will continue follow up with her rheumatology.   Of note since the patient's last visit, pt has had CT C/A/P completed on 12/26/17 with results revealing Improved CT appearance of the chest since the prior examination possibly treatment related. Slight interval decrease in size of the right supraclavicular, bilateral axillary and mediastinal lymph nodes and improving ill-defined pulmonary lesions. Findings most likely due to the patient's sarcoidosis. 2. Slightly enlarged spleen with vague low-attenuation lesions possibly due to sarcoidosis. 3. Small scattered abdominal and pelvic lymph nodes. 4. Probable benign hepatic hemangioma in the right hepatic lobe. 5. Advanced atherosclerotic calcifications involving the thoracic and abdominal aorta and branch vessels and three-vessel coronary artery calcifications. 6. Small paraesophageal hernia.  Normal pulmonary function test on 12/10/17  Lab results (11/28/17) of CBC, CMP, and Reticulocytes is as follows: all values are WNL except for WBC at 12.8k, MCV at 78.5, RDW at 17.2, PLT at 457k, ANC at 11.6k, Lymphs abs at 700,  Creatinine at 1.20, GFR at 44. Ferritin 11/28/17 is low at 12 Sed rate 11/28/17 is normal at 12 LDH 11/28/17 is elevated at 302  On review of systems, pt reports good energy levels, dry mouth, and denies noticing any new lumps or bumps, abdominal pains, leg swelling, fevers, chills, night sweats, headaches, vision changes, peripheral neuropathy, and any other symptoms.   MEDICAL HISTORY:  Past Medical History:  Diagnosis Date  . Arthritis   . Hyperlipidemia     SURGICAL HISTORY: History reviewed. No pertinent surgical history.  SOCIAL HISTORY: Social History   Socioeconomic History  . Marital status: Widowed    Spouse name: Not on file  . Number of children: Not on file  . Years of education: Not on file  . Highest education level: Not on file  Occupational History  . Not on file  Social Needs  . Financial resource strain: Not on file  . Food insecurity:    Worry: Not on file    Inability: Not on file  . Transportation needs:    Medical: Not on file    Non-medical: Not on file  Tobacco Use  . Smoking status: Former Smoker    Packs/day: 1.00    Years: 45.00    Pack years: 45.00    Types: Cigarettes    Last attempt to quit: 10/15/2007    Years since quitting: 10.2  . Smokeless tobacco: Never Used  Substance and Sexual Activity  . Alcohol use: Yes  . Drug use: No  . Sexual activity: Not on file  Lifestyle  . Physical activity:    Days per week: Not  on file    Minutes per session: Not on file  . Stress: Not on file  Relationships  . Social connections:    Talks on phone: Not on file    Gets together: Not on file    Attends religious service: Not on file    Active member of club or organization: Not on file    Attends meetings of clubs or organizations: Not on file    Relationship status: Not on file  . Intimate partner violence:    Fear of current or ex partner: Not on file    Emotionally abused: Not on file    Physically abused: Not on file    Forced sexual  activity: Not on file  Other Topics Concern  . Not on file  Social History Narrative  . Not on file    FAMILY HISTORY: Family History  Problem Relation Age of Onset  . Heart disease Father   . Heart disease Brother     ALLERGIES:  is allergic to codeine and penicillins.  MEDICATIONS:  Current Outpatient Medications  Medication Sig Dispense Refill  . acetaminophen (TYLENOL) 325 MG tablet Take 1 tablet (325 mg total) by mouth every 6 (six) hours. 30 tablet 0  . amLODipine (NORVASC) 5 MG tablet Take 5 mg by mouth every evening.     Marland Kitchen atorvastatin (LIPITOR) 40 MG tablet Take 40 mg by mouth every evening.     Marland Kitchen FOLBIC 2.5-25-2 MG TABS tablet Take 1 tablet by mouth daily.  3  . hydroxychloroquine (PLAQUENIL) 200 MG tablet Take 1 tablet (200 mg total) by mouth daily. 30 tablet 11  . Melatonin 5 MG TABS Take 5 mg by mouth at bedtime.    Marland Kitchen omeprazole (PRILOSEC) 40 MG capsule Take 40 mg by mouth every evening.     . predniSONE (DELTASONE) 20 MG tablet Take 20 mg by mouth daily. Taking a weaning dose over 4 months.  0  . venlafaxine XR (EFFEXOR-XR) 150 MG 24 hr capsule Take 150 mg by mouth every evening.      No current facility-administered medications for this visit.     REVIEW OF SYSTEMS:    A 10+ POINT REVIEW OF SYSTEMS WAS OBTAINED including neurology, dermatology, psychiatry, cardiac, respiratory, lymph, extremities, GI, GU, Musculoskeletal, constitutional, breasts, reproductive, HEENT.  All pertinent positives are noted in the HPI.  All others are negative.   PHYSICAL EXAMINATION: ECOG PERFORMANCE STATUS: 2 - Symptomatic, <50% confined to bed  Vitals:   12/31/17 1402  BP: 137/80  Pulse: (!) 110  Resp: 18  Temp: 98.4 F (36.9 C)  SpO2: 97%   Filed Weights   12/31/17 1402  Weight: 156 lb 4.8 oz (70.9 kg)   .Body mass index is 26.83 kg/m.  GENERAL:alert, in no acute distress and comfortable SKIN: no acute rashes, no significant lesions EYES: conjunctiva are pink and  non-injected, sclera anicteric OROPHARYNX: MMM, no exudates, no oropharyngeal erythema or ulceration NECK: supple, no JVD LYMPH:  no palpable lymphadenopathy in the cervical, axillary or inguinal regions LUNGS: clear to auscultation b/l with normal respiratory effort HEART: regular rate & rhythm ABDOMEN:  normoactive bowel sounds , non tender, not distended. Extremity: no pedal edema PSYCH: alert & oriented x 3 with fluent speech NEURO: no focal motor/sensory deficits   LABORATORY DATA:  I have reviewed the data as listed  . CBC Latest Ref Rng & Units 11/28/2017 03/10/2017  WBC 3.9 - 10.3 K/uL 12.8(H) 17.5(H)  Hemoglobin 11.6 - 15.9 g/dL 12.3  12.5  Hematocrit 34.8 - 46.6 % 38.4 37.6  Platelets 145 - 400 K/uL 457(H) 804(H)    . CMP Latest Ref Rng & Units 11/28/2017 10/29/2017 03/10/2017  Glucose 70 - 140 mg/dL 119 - 99  BUN 7 - 26 mg/dL 23 - 15  Creatinine 0.60 - 1.10 mg/dL 1.20(H) - 1.26(H)  Sodium 136 - 145 mmol/L 137 - 135  Potassium 3.5 - 5.1 mmol/L 4.4 - 3.5  Chloride 98 - 109 mmol/L 103 - 100(L)  CO2 22 - 29 mmol/L 25 - 21(L)  Calcium 8.4 - 10.4 mg/dL 9.4 - 9.3  Total Protein 6.4 - 8.3 g/dL 7.7 7.3 -  Total Bilirubin 0.2 - 1.2 mg/dL 0.4 0.4 -  Alkaline Phos 40 - 150 U/L 86 96 -  AST 5 - 34 U/L 17 19 -  ALT 0 - 55 U/L 18 14 -   Component     Latest Ref Rng & Units 11/28/2017  Iron     41 - 142 ug/dL 37 (L)  TIBC     236 - 444 ug/dL 399  Saturation Ratios     21 - 57 % 9 (L)  UIBC     ug/dL 362  Retic Ct Pct     0.7 - 2.1 % 1.7  RBC.     3.70 - 5.45 MIL/uL 4.89  Retic Count, Absolute     33.7 - 90.7 K/uL 83.1  Ferritin     9 - 269 ng/mL 12  Angiotensin-Converting Enzyme     14 - 82 U/L 70  Sed Rate     0 - 22 mm/hr 12  LDH     125 - 245 U/L 302 (H)    RADIOGRAPHIC STUDIES: I have personally reviewed the radiological images as listed and agreed with the findings in the report. Ct Chest W Contrast  Result Date: 12/26/2017 CLINICAL DATA:  Evaluate  lymphadenopathy. Patient does have a history of sarcoidosis. Recent biopsy of the left breast lesion. EXAM: CT CHEST, ABDOMEN, AND PELVIS WITH CONTRAST TECHNIQUE: Multidetector CT imaging of the chest, abdomen and pelvis was performed following the standard protocol during bolus administration of intravenous contrast. CONTRAST:  168mL ISOVUE-300 IOPAMIDOL (ISOVUE-300) INJECTION 61% COMPARISON:  Chest CT 09/19/2017 FINDINGS: CT CHEST FINDINGS Cardiovascular: The heart is normal in size. No pericardial effusion. There is tortuosity and atherosclerotic disease involving the thoracic aorta. The branch vessels are patent. Three-vessel coronary artery calcifications and dense calcifications at the aortic valve. Mediastinum/Nodes: Right supraclavicular lymph node on image number 6 measures 12.5 mm and previously measured 15 mm. 8 mm left axillary lymph node on image number 12 previously measured 11 mm. 5 mm right axillary and node on image number 8 previously measured 10.5 mm. 8.5 mm prevascular lymph node on image number 18 previously measured 10.5 mm. 14 mm precarinal lymph node on image number 21 previously measured 16 mm. 14 mm aorticopulmonary window lymph node on image number 19 previously measured 15 mm. 16.5 mm subcarinal lymph node on image number 25 previously measured 22 mm. Lungs/Pleura: Slight improved scattered upper lobe predominant ill-defined nodules and interstitial thickening in both lungs. Findings most consistent with known sarcoidosis. No worrisome pulmonary lesions to suggest a lung neoplasm. No pleural effusions. Musculoskeletal: No significant bony findings. CT ABDOMEN PELVIS FINDINGS Hepatobiliary: Vague area of low attenuation in the right hepatic lobe which is not persistent on the delayed images. Suspect small hemangioma. No worrisome hepatic lesions or intrahepatic biliary dilatation. Numerous gallstones are noted the gallbladder.  No common bile duct dilatation. Pancreas: No mass, inflammation  or ductal dilatation. Small exophytic cyst projecting off the body of the pancreas anteriorly measures a maximum of 8 mm. This appears stable since 2007. Spleen: Small low-attenuation splenic lesions are indeterminate but could be due to sarcoidosis. Borderline splenomegaly. Adrenals/Urinary Tract: Adrenal glands and kidneys are unremarkable. No worrisome renal lesions. Small renal cysts. No hydronephrosis. The bladder is unremarkable. Stomach/Bowel: The stomach, duodenum, small bowel and colon are unremarkable. No acute inflammatory changes, mass lesions or obstructive findings. The terminal ileum is normal. The appendix is normal. Vascular/Lymphatic: Dense atherosclerotic calcifications involving the aorta and branch vessel ostia. Dense iliac artery calcifications. No focal aneurysm or dissection. Branch vessels are patent. The major venous structures are patent. 11 mm celiac axis lymph node on image number 54. 11 mm lymph node in the hepatic duodenum ligament area on image number 58. Small scattered retroperitoneal lymph nodes. There is a 9.5 mm node adjacent to the vena cava on image number 70. 12 mm right common iliac lymph node on image number 88. Bilateral 7 mm operator lymph nodes. No inguinal adenopathy. Reproductive: Surgically absent. Other: No pelvic mass or free pelvic fluid collections. No inguinal mass or adenopathy. Musculoskeletal: No significant bony findings. IMPRESSION: 1. Improved CT appearance of the chest since the prior examination possibly treatment related. Slight interval decrease in size of the right supraclavicular, bilateral axillary and mediastinal lymph nodes and improving ill-defined pulmonary lesions. Findings most likely due to the patient's sarcoidosis. 2. Slightly enlarged spleen with vague low-attenuation lesions possibly due to sarcoidosis. 3. Small scattered abdominal and pelvic lymph nodes. 4. Probable benign hepatic hemangioma in the right hepatic lobe. 5. Advanced  atherosclerotic calcifications involving the thoracic and abdominal aorta and branch vessels and three-vessel coronary artery calcifications. 6. Small paraesophageal hernia. Electronically Signed   By: Marijo Sanes M.D.   On: 12/26/2017 16:59   Ct Abdomen Pelvis W Contrast  Result Date: 12/26/2017 CLINICAL DATA:  Evaluate lymphadenopathy. Patient does have a history of sarcoidosis. Recent biopsy of the left breast lesion. EXAM: CT CHEST, ABDOMEN, AND PELVIS WITH CONTRAST TECHNIQUE: Multidetector CT imaging of the chest, abdomen and pelvis was performed following the standard protocol during bolus administration of intravenous contrast. CONTRAST:  129mL ISOVUE-300 IOPAMIDOL (ISOVUE-300) INJECTION 61% COMPARISON:  Chest CT 09/19/2017 FINDINGS: CT CHEST FINDINGS Cardiovascular: The heart is normal in size. No pericardial effusion. There is tortuosity and atherosclerotic disease involving the thoracic aorta. The branch vessels are patent. Three-vessel coronary artery calcifications and dense calcifications at the aortic valve. Mediastinum/Nodes: Right supraclavicular lymph node on image number 6 measures 12.5 mm and previously measured 15 mm. 8 mm left axillary lymph node on image number 12 previously measured 11 mm. 5 mm right axillary and node on image number 8 previously measured 10.5 mm. 8.5 mm prevascular lymph node on image number 18 previously measured 10.5 mm. 14 mm precarinal lymph node on image number 21 previously measured 16 mm. 14 mm aorticopulmonary window lymph node on image number 19 previously measured 15 mm. 16.5 mm subcarinal lymph node on image number 25 previously measured 22 mm. Lungs/Pleura: Slight improved scattered upper lobe predominant ill-defined nodules and interstitial thickening in both lungs. Findings most consistent with known sarcoidosis. No worrisome pulmonary lesions to suggest a lung neoplasm. No pleural effusions. Musculoskeletal: No significant bony findings. CT ABDOMEN PELVIS  FINDINGS Hepatobiliary: Vague area of low attenuation in the right hepatic lobe which is not persistent on the delayed images. Suspect small  hemangioma. No worrisome hepatic lesions or intrahepatic biliary dilatation. Numerous gallstones are noted the gallbladder. No common bile duct dilatation. Pancreas: No mass, inflammation or ductal dilatation. Small exophytic cyst projecting off the body of the pancreas anteriorly measures a maximum of 8 mm. This appears stable since 2007. Spleen: Small low-attenuation splenic lesions are indeterminate but could be due to sarcoidosis. Borderline splenomegaly. Adrenals/Urinary Tract: Adrenal glands and kidneys are unremarkable. No worrisome renal lesions. Small renal cysts. No hydronephrosis. The bladder is unremarkable. Stomach/Bowel: The stomach, duodenum, small bowel and colon are unremarkable. No acute inflammatory changes, mass lesions or obstructive findings. The terminal ileum is normal. The appendix is normal. Vascular/Lymphatic: Dense atherosclerotic calcifications involving the aorta and branch vessel ostia. Dense iliac artery calcifications. No focal aneurysm or dissection. Branch vessels are patent. The major venous structures are patent. 11 mm celiac axis lymph node on image number 54. 11 mm lymph node in the hepatic duodenum ligament area on image number 58. Small scattered retroperitoneal lymph nodes. There is a 9.5 mm node adjacent to the vena cava on image number 70. 12 mm right common iliac lymph node on image number 88. Bilateral 7 mm operator lymph nodes. No inguinal adenopathy. Reproductive: Surgically absent. Other: No pelvic mass or free pelvic fluid collections. No inguinal mass or adenopathy. Musculoskeletal: No significant bony findings. IMPRESSION: 1. Improved CT appearance of the chest since the prior examination possibly treatment related. Slight interval decrease in size of the right supraclavicular, bilateral axillary and mediastinal lymph nodes  and improving ill-defined pulmonary lesions. Findings most likely due to the patient's sarcoidosis. 2. Slightly enlarged spleen with vague low-attenuation lesions possibly due to sarcoidosis. 3. Small scattered abdominal and pelvic lymph nodes. 4. Probable benign hepatic hemangioma in the right hepatic lobe. 5. Advanced atherosclerotic calcifications involving the thoracic and abdominal aorta and branch vessels and three-vessel coronary artery calcifications. 6. Small paraesophageal hernia. Electronically Signed   By: Marijo Sanes M.D.   On: 12/26/2017 16:59   CT chest 09/19/2017: IMPRESSION: 1. Moderate mediastinal and right supraclavicular lymphadenopathy, increased. New mild bilateral axillary and bilateral hilar lymphadenopathy. Findings are worrisome for a malignant process such as a lymphoproliferative disorder or metastatic nodal disease. The right supraclavicular node may be (but not definitely) accessible for percutaneous biopsy. 2. Numerous similar-appearing subsolid spiculated pulmonary nodules scattered throughout both lungs, upper lobe predominant, largest 1.6 cm, stable to mildly increased in size and slightly increased in density in the interval. These are indeterminate, with multifocal lung adenocarcinoma on the differential. 3. Several new subcentimeter hypodense splenic lesions. Solitary small hypodense posterior right liver lobe lesion. These lesions are indeterminate, with lymphoproliferative disorder or metastatic disease not excluded. Options include further characterization with MRI abdomen without and with IV contrast or short-term follow-up CT abdomen with IV contrast in 3 months. 4. Chronic findings include: Left main and 3 vessel coronary atherosclerosis. Small hiatal hernia. Cholelithiasis.  Aortic Atherosclerosis (ICD10-I70.0).  Electronically Signed   By: Ilona Sorrel M.D.   On: 09/19/2017 11:54   ASSESSMENT & PLAN:  72 y.o. female with  1.  Generalized lymphadenopathy with multiple pulmonary nodules Ct chest 09/19/2017: Moderate mediastinal and right supraclavicular lymphadenopathy, increased. New mild bilateral axillary and bilateral hilar lymphadenopathy. Findings are worrisome for a malignant process such as a lymphoproliferative disorder or metastatic nodal disease. The right supraclavicular node may be (but not definitely) accessible for percutaneous biopsy. 2. Numerous similar-appearing subsolid spiculated pulmonary nodules scattered throughout both lungs, upper lobe predominant, largest 1.6 cm, stable to  mildly increased in size and slightly increased in density in the interval. These are indeterminate, with multifocal lung adenocarcinoma on the differential.  2. Breast - lesion - Bx -- granulomatous inflammation -consistent with sarcoidosis. 3. Skin rash on nose - Bx consistent with sarcoidosis.  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 She is not at all keen to consider additional bx of her supraclavicular LN --the LNadneopathy (clinically improved on steroids) and pulmonary nodules also likely related to sarcoidosis. -patient symptoms have improved with prednisone and plaquenil that she is on from the the rheumatologist. -Continue follow up with dermatology, rheumatology and pulmonology.  -Discussed pt labwork today, 11/28/17; Ferritin low at 12, LDH elevated at 302 -Discussed 12/26/17 CT C/A/P which revealed interval response to steroids in LNadenopathy -Recommend PO Iron Polysaccharide -Multi-system sarcoidosis is most indicated thus far with lung nodules, skin involvement, and LNadenopathy -Discussed repeat CT again in 6 months or surgical LN bx; pt would prefer to evaluate with CT -Pt would prefer to have repeat CT imaging in 6 months with rheumatology -Will continue to see pt as needed   4. Iron deficiency - no significant anemia . Lab Results    Component Value Date   IRON 37 (L) 11/28/2017   TIBC 399 11/28/2017   IRONPCTSAT 9 (L) 11/28/2017   (Iron and TIBC)  Lab Results  Component Value Date   FERRITIN 12 11/28/2017   PLAN - GI w/u - will defer to PCP -?GI involvement by sarcoidosis.  5. 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 The patient knows to call the clinic with any problems, questions or concerns.  The total time spent in the appt was 25 minutes and more than 50% was on counseling and direct patient cares.    Sullivan Lone MD MS AAHIVMS [facility] [place] [facility]  (Office):       [phone] (Work cell):  [phone] (Fax):           [phone]  12/31/2017 2:58 PM  I, Baldwin Jamaica, am acting as a Education administrator for Dr Irene Limbo.   .I have reviewed the above documentation for accuracy and completeness, and I agree with the above. Brunetta Genera MD

## 2017-12-31 ENCOUNTER — Inpatient Hospital Stay: Payer: PPO | Attending: Hematology | Admitting: Hematology

## 2017-12-31 ENCOUNTER — Encounter: Payer: Self-pay | Admitting: Hematology

## 2017-12-31 VITALS — BP 137/80 | HR 110 | Temp 98.4°F | Resp 18 | Ht 64.0 in | Wt 156.3 lb

## 2017-12-31 DIAGNOSIS — D473 Essential (hemorrhagic) thrombocythemia: Secondary | ICD-10-CM | POA: Diagnosis not present

## 2017-12-31 DIAGNOSIS — D75839 Thrombocytosis, unspecified: Secondary | ICD-10-CM

## 2017-12-31 DIAGNOSIS — R591 Generalized enlarged lymph nodes: Secondary | ICD-10-CM | POA: Insufficient documentation

## 2017-12-31 DIAGNOSIS — D863 Sarcoidosis of skin: Secondary | ICD-10-CM | POA: Diagnosis not present

## 2017-12-31 DIAGNOSIS — I2510 Atherosclerotic heart disease of native coronary artery without angina pectoris: Secondary | ICD-10-CM | POA: Insufficient documentation

## 2017-12-31 DIAGNOSIS — E611 Iron deficiency: Secondary | ICD-10-CM | POA: Insufficient documentation

## 2018-01-01 ENCOUNTER — Telehealth: Payer: Self-pay

## 2018-01-01 NOTE — Telephone Encounter (Signed)
RTC with Dr Irene Limbo on an as needed basis. Per 6/25 los

## 2018-01-03 ENCOUNTER — Other Ambulatory Visit: Payer: Self-pay | Admitting: Oral Surgery

## 2018-01-03 DIAGNOSIS — D2310 Other benign neoplasm of skin of unspecified eyelid, including canthus: Secondary | ICD-10-CM | POA: Diagnosis not present

## 2018-01-03 DIAGNOSIS — K136 Irritative hyperplasia of oral mucosa: Secondary | ICD-10-CM | POA: Diagnosis not present

## 2018-01-03 DIAGNOSIS — D100 Benign neoplasm of lip: Secondary | ICD-10-CM | POA: Diagnosis not present

## 2018-02-21 DIAGNOSIS — H52223 Regular astigmatism, bilateral: Secondary | ICD-10-CM | POA: Diagnosis not present

## 2018-02-21 DIAGNOSIS — H5203 Hypermetropia, bilateral: Secondary | ICD-10-CM | POA: Diagnosis not present

## 2018-02-21 DIAGNOSIS — H524 Presbyopia: Secondary | ICD-10-CM | POA: Diagnosis not present

## 2018-02-21 DIAGNOSIS — Z79899 Other long term (current) drug therapy: Secondary | ICD-10-CM | POA: Diagnosis not present

## 2018-02-21 DIAGNOSIS — H35053 Retinal neovascularization, unspecified, bilateral: Secondary | ICD-10-CM | POA: Diagnosis not present

## 2018-02-21 DIAGNOSIS — H3561 Retinal hemorrhage, right eye: Secondary | ICD-10-CM | POA: Diagnosis not present

## 2018-02-21 DIAGNOSIS — H35021 Exudative retinopathy, right eye: Secondary | ICD-10-CM | POA: Diagnosis not present

## 2018-03-03 DIAGNOSIS — M25471 Effusion, right ankle: Secondary | ICD-10-CM | POA: Diagnosis not present

## 2018-03-03 DIAGNOSIS — D869 Sarcoidosis, unspecified: Secondary | ICD-10-CM | POA: Diagnosis not present

## 2018-03-03 DIAGNOSIS — L03119 Cellulitis of unspecified part of limb: Secondary | ICD-10-CM | POA: Diagnosis not present

## 2018-03-03 DIAGNOSIS — Z6830 Body mass index (BMI) 30.0-30.9, adult: Secondary | ICD-10-CM | POA: Diagnosis not present

## 2018-03-03 DIAGNOSIS — R590 Localized enlarged lymph nodes: Secondary | ICD-10-CM | POA: Diagnosis not present

## 2018-03-03 DIAGNOSIS — I7300 Raynaud's syndrome without gangrene: Secondary | ICD-10-CM | POA: Diagnosis not present

## 2018-03-03 DIAGNOSIS — M25472 Effusion, left ankle: Secondary | ICD-10-CM | POA: Diagnosis not present

## 2018-03-03 DIAGNOSIS — L52 Erythema nodosum: Secondary | ICD-10-CM | POA: Diagnosis not present

## 2018-03-03 DIAGNOSIS — E669 Obesity, unspecified: Secondary | ICD-10-CM | POA: Diagnosis not present

## 2018-03-03 DIAGNOSIS — R9389 Abnormal findings on diagnostic imaging of other specified body structures: Secondary | ICD-10-CM | POA: Diagnosis not present

## 2018-03-06 DIAGNOSIS — M25552 Pain in left hip: Secondary | ICD-10-CM | POA: Diagnosis not present

## 2018-03-06 DIAGNOSIS — M7062 Trochanteric bursitis, left hip: Secondary | ICD-10-CM | POA: Diagnosis not present

## 2018-03-12 DIAGNOSIS — H35372 Puckering of macula, left eye: Secondary | ICD-10-CM | POA: Diagnosis not present

## 2018-03-12 DIAGNOSIS — H43822 Vitreomacular adhesion, left eye: Secondary | ICD-10-CM | POA: Diagnosis not present

## 2018-03-12 DIAGNOSIS — H34233 Retinal artery branch occlusion, bilateral: Secondary | ICD-10-CM | POA: Diagnosis not present

## 2018-03-12 DIAGNOSIS — H348311 Tributary (branch) retinal vein occlusion, right eye, with retinal neovascularization: Secondary | ICD-10-CM | POA: Diagnosis not present

## 2018-03-19 ENCOUNTER — Telehealth: Payer: Self-pay | Admitting: Surgery

## 2018-03-19 NOTE — Telephone Encounter (Signed)
Called patient to inform her of what Dr.Brabham said about previous recent CT's being sufficient for her upcoming appointment. awt

## 2018-03-19 NOTE — Telephone Encounter (Signed)
-----   Message from Serafina Mitchell, MD sent at 03/18/2018  8:40 PM EDT ----- Regarding: RE: Appointment Question Contact: [phone] These should be sufficient ----- Message ----- From: Lujean Amel Sent: 03/18/2018   1:53 PM EDT To: Serafina Mitchell, MD Subject: Appointment Question                           Dr.Brabham, I have a question regarding this patient. She called today to reschedule her cancelled appointment from 05/2017. The notes indicate that she was also scheduled to have a CTA chest/ abd/pelvis prior to seeing you for that visit. Patient states she had a CTA abd/pelvis on 12/26/17 @ [facility] and also a CT Chest on 09/19/17 at [place]. Will these be current enough for you or does she need CTA's repeated prior to her appt on 05/12/18. Please advise. Thanks, Anne Ng

## 2018-04-08 DIAGNOSIS — I7300 Raynaud's syndrome without gangrene: Secondary | ICD-10-CM | POA: Diagnosis not present

## 2018-05-08 DIAGNOSIS — M25471 Effusion, right ankle: Secondary | ICD-10-CM | POA: Diagnosis not present

## 2018-05-08 DIAGNOSIS — E669 Obesity, unspecified: Secondary | ICD-10-CM | POA: Diagnosis not present

## 2018-05-08 DIAGNOSIS — R590 Localized enlarged lymph nodes: Secondary | ICD-10-CM | POA: Diagnosis not present

## 2018-05-08 DIAGNOSIS — M25472 Effusion, left ankle: Secondary | ICD-10-CM | POA: Diagnosis not present

## 2018-05-08 DIAGNOSIS — Z6831 Body mass index (BMI) 31.0-31.9, adult: Secondary | ICD-10-CM | POA: Diagnosis not present

## 2018-05-08 DIAGNOSIS — R9389 Abnormal findings on diagnostic imaging of other specified body structures: Secondary | ICD-10-CM | POA: Diagnosis not present

## 2018-05-08 DIAGNOSIS — L52 Erythema nodosum: Secondary | ICD-10-CM | POA: Diagnosis not present

## 2018-05-08 DIAGNOSIS — D869 Sarcoidosis, unspecified: Secondary | ICD-10-CM | POA: Diagnosis not present

## 2018-05-08 DIAGNOSIS — I7300 Raynaud's syndrome without gangrene: Secondary | ICD-10-CM | POA: Diagnosis not present

## 2018-05-08 DIAGNOSIS — L03119 Cellulitis of unspecified part of limb: Secondary | ICD-10-CM | POA: Diagnosis not present

## 2018-05-12 ENCOUNTER — Encounter

## 2018-05-12 ENCOUNTER — Other Ambulatory Visit: Payer: Self-pay

## 2018-05-12 ENCOUNTER — Encounter: Payer: Self-pay | Admitting: Surgery

## 2018-05-12 ENCOUNTER — Ambulatory Visit (INDEPENDENT_AMBULATORY_CARE_PROVIDER_SITE_OTHER): Payer: PPO | Admitting: Surgery

## 2018-05-12 VITALS — BP 146/95 | HR 103 | Temp 98.4°F | Resp 16 | Ht 64.0 in | Wt 166.0 lb

## 2018-05-12 DIAGNOSIS — I70213 Atherosclerosis of native arteries of extremities with intermittent claudication, bilateral legs: Secondary | ICD-10-CM

## 2018-05-12 NOTE — Progress Notes (Signed)
[facility] [place]  Patient name: Alexis Murphy MRN: [id number] DOB: 1945/11/11 Sex: female   REASON FOR VISIT:    Follow up  [place]:    Alexis Murphy is a 72 y.o. female who I initially saw on 11/05/2016 for evaluation of leg pain which has been going on for approximately 4 years.  It initially presented with a problem in her left fifth toe which was felt to be an ingrown toenail.  This was removed but her symptoms did not improve.  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  She states that she will occasionally get severe pain and bluish discoloration of her toes.  She has taken Neurontin which helped.  She tries to avoid cold temperatures and she does better in the summertime.  She has tried trying Tylenol, but this did not give her any relief.  She has taken a calcium channel blocker for the possibility of ray nodes.  She does have a history of autoimmune disease in her family, with her mother having been diagnosed with lupus.  She does not have any claudication symptoms.  I was concerned about embolic disease and therefore I sent her for a CT angiogram of the chest abdomen pelvis which showed mural thrombus and multiple areas that could be the source of possible embolic disease.  Because of the diffuse nature of the process I did not think she was a surgical candidate.  I recommended treating it medically.  She states that she bruises too easily with blood thinners.  I started her on an 81 mg aspirin.  She is here today with a CT scan to discuss results.  In the interval since I last seen her, she was diagnosed with sarcoidosis.  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  She has stopped taking the aspirin we had recommended but has now restarted it.  She has not had any other neurologic episodes   PAST  MEDICAL HISTORY:   Past Medical History:  Diagnosis Date  . Arthritis   . Hyperlipidemia      FAMILY HISTORY:   Family History  Problem Relation Age of Onset  . Heart disease Father   . Heart disease Brother     SOCIAL HISTORY:   Social History   Tobacco Use  . Smoking status: Former Smoker    Packs/day: 1.00    Years: 45.00    Pack years: 45.00    Types: Cigarettes    Last attempt to quit: 10/15/2007    Years since quitting: 10.5  . Smokeless tobacco: Never Used  Substance Use Topics  . Alcohol use: Yes     ALLERGIES:   Allergies  Allergen Reactions  . Codeine Other (See Comments)    Vomiting   . Penicillins Rash    Has patient had a PCN reaction causing immediate rash, facial/tongue/throat swelling, SOB or lightheadedness with hypotension: yes Has patient had a PCN reaction causing severe rash involving mucus membranes or skin necrosis:  no Has patient had a PCN reaction that required hospitalization:  no Has patient had a PCN reaction occurring within the last 10 years: no If all of the above answers are "NO", then may proceed with Cephalosporin use.      CURRENT MEDICATIONS:   Current Outpatient Medications  Medication Sig Dispense Refill  . acetaminophen (TYLENOL) 325 MG tablet Take 1 tablet (  325 mg total) by mouth every 6 (six) hours. 30 tablet 0  . amLODipine (NORVASC) 5 MG tablet Take 5 mg by mouth every evening.     Marland Kitchen atorvastatin (LIPITOR) 40 MG tablet Take 40 mg by mouth every evening.     Marland Kitchen FOLBIC 2.5-25-2 MG TABS tablet Take 1 tablet by mouth daily.  3  . Melatonin 5 MG TABS Take 5 mg by mouth at bedtime.    Marland Kitchen omeprazole (PRILOSEC) 40 MG capsule Take 40 mg by mouth every evening.     . predniSONE (DELTASONE) 20 MG tablet Take 10 mg by mouth daily. Taking a weaning dose over 4 months.  0  . venlafaxine XR (EFFEXOR-XR) 150 MG 24 hr capsule Take 150 mg by mouth every evening.     . vitamin B-12 (CYANOCOBALAMIN) 1000 MCG tablet Take 1,000 mcg by  mouth daily.    . methotrexate (RHEUMATREX) 2.5 MG tablet 4 TABLETS ONCE WEEKLY ORALLY 30 DAYS  2   No current facility-administered medications for this visit.     REVIEW OF SYSTEMS:   [X]  denotes positive finding, [ ]  denotes negative finding Cardiac  Comments:  Chest pain or chest pressure:    Shortness of breath upon exertion:    Short of breath when lying flat:    Irregular heart rhythm:        Vascular    Pain in calf, thigh, or hip brought on by ambulation:    Pain in feet at night that wakes you up from your sleep:     Blood clot in your veins:    Leg swelling:         Pulmonary    Oxygen at home:    Productive cough:     Wheezing:         Neurologic    Sudden weakness in arms or legs:     Sudden numbness in arms or legs:     Sudden onset of difficulty speaking or slurred speech:    Temporary loss of vision in one eye:     Problems with dizziness:         Gastrointestinal    Blood in stool:     Vomited blood:         Genitourinary    Burning when urinating:     Blood in urine:        Psychiatric    Major depression:         Hematologic    Bleeding problems:    Problems with blood clotting too easily:        Skin    Rashes or ulcers:        Constitutional    Fever or chills:      PHYSICAL EXAM:   Vitals:   05/12/18 1619  BP: (!) 146/95  Pulse: (!) 103  Resp: 16  Temp: 98.4 F (36.9 C)  TempSrc: Oral  SpO2: 92%  Weight: 166 lb (75.3 kg)  Height: 5\' 4"  (1.626 m)    GENERAL: The patient is a well-nourished female, in no acute distress. The vital signs are documented above. CARDIAC: There is a regular rate and rhythm.  VASCULAR: Palpable pedal pulses PULMONARY: Non-labored respirations MUSCULOSKELETAL: There are no major deformities or cyanosis. NEUROLOGIC: No focal weakness or paresthesias are detected. SKIN: There are no ulcers or rashes noted. PSYCHIATRIC: The patient has a normal affect.  STUDIES:   I have reviewed her CTA with  the following findings: 1. Improved CT appearance  of the chest since the prior examination possibly treatment related. Slight interval decrease in size of the right supraclavicular, bilateral axillary and mediastinal lymph nodes and improving ill-defined pulmonary lesions. Findings most likely due to the patient's sarcoidosis. 2. Slightly enlarged spleen with vague low-attenuation lesions possibly due to sarcoidosis. 3. Small scattered abdominal and pelvic lymph nodes. 4. Probable benign hepatic hemangioma in the right hepatic lobe. 5. Advanced atherosclerotic calcifications involving the thoracic and abdominal aorta and branch vessels and three-vessel coronary artery calcifications. 6. Small paraesophageal hernia.  MEDICAL ISSUES:   Aortic atherosclerosis: I suspect that her left fifth toe problem many years ago was embolic in nature.  Because of the diffuse nature of the atherosclerotic disease within her thoracic and abdominal aorta, she is not really a candidate for surgical intervention.  Therefore we need to focus on medical management which I believe involves the use of antiplatelet therapy.  She is now back on aspirin.  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  I have reviewed her CT scan of the chest from last year.  She has minimal atherosclerotic plaque at the origin of her innominate and left carotid artery.  I think she is going to need to get an echocardiogram.  I am going to try to get a carotid duplex and have her come back after these tests have been performed.    Annamarie Major, MD [facility] [phone] Pager [phone]

## 2018-05-14 ENCOUNTER — Other Ambulatory Visit: Payer: Self-pay

## 2018-05-14 DIAGNOSIS — I6529 Occlusion and stenosis of unspecified carotid artery: Secondary | ICD-10-CM

## 2018-05-26 ENCOUNTER — Encounter: Payer: Self-pay | Admitting: Physician Assistant

## 2018-05-26 ENCOUNTER — Other Ambulatory Visit: Payer: Self-pay

## 2018-05-26 ENCOUNTER — Ambulatory Visit (HOSPITAL_COMMUNITY)
Admission: RE | Admit: 2018-05-26 | Discharge: 2018-05-26 | Disposition: A | Discharge status: Home / Self Care | Payer: PPO | Source: Ambulatory Visit | Attending: Family Medicine | Admitting: Family Medicine

## 2018-05-26 ENCOUNTER — Ambulatory Visit (INDEPENDENT_AMBULATORY_CARE_PROVIDER_SITE_OTHER): Payer: PPO | Admitting: Physician Assistant

## 2018-05-26 DIAGNOSIS — I739 Peripheral vascular disease, unspecified: Secondary | ICD-10-CM | POA: Diagnosis not present

## 2018-05-26 DIAGNOSIS — I6529 Occlusion and stenosis of unspecified carotid artery: Secondary | ICD-10-CM | POA: Insufficient documentation

## 2018-05-26 DIAGNOSIS — H349 Unspecified retinal vascular occlusion: Secondary | ICD-10-CM | POA: Diagnosis not present

## 2018-05-26 NOTE — Progress Notes (Signed)
Established Carotid Patient   History of Present Illness   Alexis Murphy is a 72 y.o. (02-Mar-1946) female who returns to office to go over carotid duplex study.  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  She has not had any changes in her vision, one-sided weakness or slurring speech.  No history of CVA or TIA.  She is back to taking an 81 mg aspirin daily.    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  She has had episodes of toe discoloration in the past which are believed to be embolic events.  She denies any claudication, rest pain, or any active tissue changes.  Past medical history also significant for sarcoidosis.  Current Outpatient Medications  Medication Sig Dispense Refill  . acetaminophen (TYLENOL) 325 MG tablet Take 1 tablet (325 mg total) by mouth every 6 (six) hours. 30 tablet 0  . amLODipine (NORVASC) 5 MG tablet Take 5 mg by mouth every evening.     Marland Kitchen atorvastatin (LIPITOR) 40 MG tablet Take 40 mg by mouth every evening.     Marland Kitchen FOLBIC 2.5-25-2 MG TABS tablet Take 1 tablet by mouth daily.  3  . Melatonin 5 MG TABS Take 5 mg by mouth at bedtime.    . methotrexate (RHEUMATREX) 2.5 MG tablet 4 TABLETS ONCE WEEKLY ORALLY 30 DAYS  2  . omeprazole (PRILOSEC) 40 MG capsule Take 40 mg by mouth every evening.     . predniSONE (DELTASONE) 20 MG tablet Take 10 mg by mouth daily. Taking a weaning dose over 4 months.  0  . venlafaxine XR (EFFEXOR-XR) 150 MG 24 hr capsule Take 150 mg by mouth every evening.     . vitamin B-12 (CYANOCOBALAMIN) 1000 MCG tablet Take 1,000 mcg by mouth daily.     No current facility-administered medications for this visit.     On ROS today: 10 system ROS is negative unless otherwise noted in HPI   Physical Examination   Vitals:   05/26/18 1548  BP: (!) 150/86  Pulse: (!) 103  Resp: 16    Temp: 98.7 F (37.1 C)  TempSrc: Oral  SpO2: 96%  Weight: 160 lb (72.6 kg)  Height: 5\' 1"  (1.549 m)   Body mass index is 30.23 kg/m.  General Alert, O x 3, WD, NAD  Neck Supple, mid-line trachea,    Pulmonary Sym exp, good B air movt, CTA B  Cardiac RRR, Nl S1, S2,  Vascular Vessel Right Left  Radial Palpable Palpable  Carotid Palpable, No Bruit Palpable, No Bruit  Aorta Not palpable N/A  Popliteal Not palpable Not palpable  PT Palpable Palpable    Gastro- intestinal soft, non-distended, non-tender to palpation,   Musculo- skeletal M/S 5/5 throughout  , Extremities without ischemic changes    Neurologic Cranial nerves 2-12 intact     Non-Invasive Vascular Imaging   B Carotid Duplex (05/26/18):   R ICA stenosis:  1-39%  R VA: patent and antegrade  L ICA stenosis:  1-39%  L VA: patent and antegrade   Medical Decision Making   Alexis Murphy is a 72 y.o. female who presents to go over carotid duplex   Carotid duplex demonstrates 1-39% stenosis of bilateral internal carotid arteries  Recommend continued daily 81 mg aspirin  Due to new diagnosis of retinal artery occlusion and a negative carotid duplex, we  would recommend an echocardiogram to complete embolic work-up; patient states our practice is out of network and would prefer that this come from her primary care physician.  Patient is scheduled for re-check ABI in 1 year  Patient knows to return to office sooner if rest pain or tissue changes develop BLE   Dagoberto Ligas PA-C [facility] [place]: [phone]

## 2018-06-23 ENCOUNTER — Other Ambulatory Visit: Payer: Self-pay | Admitting: Family Medicine

## 2018-06-23 DIAGNOSIS — Z1231 Encounter for screening mammogram for malignant neoplasm of breast: Secondary | ICD-10-CM

## 2018-07-08 DIAGNOSIS — R9389 Abnormal findings on diagnostic imaging of other specified body structures: Secondary | ICD-10-CM | POA: Diagnosis not present

## 2018-07-08 DIAGNOSIS — I7300 Raynaud's syndrome without gangrene: Secondary | ICD-10-CM | POA: Diagnosis not present

## 2018-07-08 DIAGNOSIS — D869 Sarcoidosis, unspecified: Secondary | ICD-10-CM | POA: Diagnosis not present

## 2018-07-08 DIAGNOSIS — L03119 Cellulitis of unspecified part of limb: Secondary | ICD-10-CM | POA: Diagnosis not present

## 2018-07-08 DIAGNOSIS — Z6832 Body mass index (BMI) 32.0-32.9, adult: Secondary | ICD-10-CM | POA: Diagnosis not present

## 2018-07-08 DIAGNOSIS — L52 Erythema nodosum: Secondary | ICD-10-CM | POA: Diagnosis not present

## 2018-07-08 DIAGNOSIS — R590 Localized enlarged lymph nodes: Secondary | ICD-10-CM | POA: Diagnosis not present

## 2018-07-08 DIAGNOSIS — E669 Obesity, unspecified: Secondary | ICD-10-CM | POA: Diagnosis not present

## 2018-07-08 DIAGNOSIS — M25471 Effusion, right ankle: Secondary | ICD-10-CM | POA: Diagnosis not present

## 2018-07-08 DIAGNOSIS — M25472 Effusion, left ankle: Secondary | ICD-10-CM | POA: Diagnosis not present

## 2018-07-10 ENCOUNTER — Other Ambulatory Visit: Payer: Self-pay | Admitting: Physician Assistant

## 2018-07-10 DIAGNOSIS — R9389 Abnormal findings on diagnostic imaging of other specified body structures: Secondary | ICD-10-CM

## 2018-07-11 ENCOUNTER — Inpatient Hospital Stay: Payer: Medicare HMO | Attending: Hematology | Admitting: Hematology

## 2018-07-11 ENCOUNTER — Telehealth: Payer: Self-pay

## 2018-07-11 ENCOUNTER — Telehealth: Payer: Self-pay | Admitting: *Deleted

## 2018-07-11 ENCOUNTER — Inpatient Hospital Stay: Admission: RE | Admit: 2018-07-11 | Payer: PPO | Source: Ambulatory Visit

## 2018-07-11 ENCOUNTER — Inpatient Hospital Stay: Payer: Medicare HMO

## 2018-07-11 VITALS — BP 171/86 | HR 100 | Temp 98.1°F | Resp 18 | Ht 61.0 in | Wt 168.0 lb

## 2018-07-11 DIAGNOSIS — D75839 Thrombocytosis, unspecified: Secondary | ICD-10-CM

## 2018-07-11 DIAGNOSIS — R599 Enlarged lymph nodes, unspecified: Secondary | ICD-10-CM

## 2018-07-11 DIAGNOSIS — D72829 Elevated white blood cell count, unspecified: Secondary | ICD-10-CM

## 2018-07-11 DIAGNOSIS — Z87891 Personal history of nicotine dependence: Secondary | ICD-10-CM | POA: Diagnosis not present

## 2018-07-11 DIAGNOSIS — R918 Other nonspecific abnormal finding of lung field: Secondary | ICD-10-CM | POA: Diagnosis not present

## 2018-07-11 DIAGNOSIS — R591 Generalized enlarged lymph nodes: Secondary | ICD-10-CM

## 2018-07-11 DIAGNOSIS — D473 Essential (hemorrhagic) thrombocythemia: Secondary | ICD-10-CM | POA: Diagnosis not present

## 2018-07-11 LAB — CBC WITH DIFFERENTIAL/PLATELET
Abs Immature Granulocytes: 0.14 10*3/uL — ABNORMAL HIGH (ref 0.00–0.07)
Basophils Absolute: 0.1 10*3/uL (ref 0.0–0.1)
Basophils Relative: 1 %
EOS ABS: 0.3 10*3/uL (ref 0.0–0.5)
EOS PCT: 2 %
HEMATOCRIT: 40.4 % (ref 36.0–46.0)
HEMOGLOBIN: 12.5 g/dL (ref 12.0–15.0)
IMMATURE GRANULOCYTES: 1 %
LYMPHS ABS: 1.3 10*3/uL (ref 0.7–4.0)
LYMPHS PCT: 8 %
MCH: 25.1 pg — ABNORMAL LOW (ref 26.0–34.0)
MCHC: 30.9 g/dL (ref 30.0–36.0)
MCV: 81 fL (ref 80.0–100.0)
MONOS PCT: 4 %
Monocytes Absolute: 0.7 10*3/uL (ref 0.1–1.0)
Neutro Abs: 14.4 10*3/uL — ABNORMAL HIGH (ref 1.7–7.7)
Neutrophils Relative %: 84 %
Platelets: 1057 10*3/uL — ABNORMAL HIGH (ref 150–400)
RBC: 4.99 MIL/uL (ref 3.87–5.11)
RDW: 20.1 % — AB (ref 11.5–15.5)
WBC: 16.8 10*3/uL — ABNORMAL HIGH (ref 4.0–10.5)
nRBC: 0 % (ref 0.0–0.2)

## 2018-07-11 LAB — CMP (CANCER CENTER ONLY)
ALBUMIN: 4.7 g/dL (ref 3.5–5.0)
ALK PHOS: 84 U/L (ref 38–126)
ALT: 22 U/L (ref 0–44)
AST: 21 U/L (ref 15–41)
Anion gap: 13 (ref 5–15)
BUN: 20 mg/dL (ref 8–23)
CALCIUM: 9.7 mg/dL (ref 8.9–10.3)
CO2: 23 mmol/L (ref 22–32)
CREATININE: 1.74 mg/dL — AB (ref 0.44–1.00)
Chloride: 105 mmol/L (ref 98–111)
GFR, Est AFR Am: 34 mL/min — ABNORMAL LOW (ref >60)
GFR, Estimated: 29 mL/min — ABNORMAL LOW (ref >60)
GLUCOSE: 103 mg/dL — AB (ref 70–99)
Potassium: 4.2 mmol/L (ref 3.5–5.1)
SODIUM: 141 mmol/L (ref 135–145)
Total Bilirubin: 0.5 mg/dL (ref 0.3–1.2)
Total Protein: 7.5 g/dL (ref 6.5–8.1)

## 2018-07-11 LAB — RETICULOCYTES
Immature Retic Fract: 21.6 % — ABNORMAL HIGH (ref 2.3–15.9)
RBC.: 4.99 MIL/uL (ref 3.87–5.11)
Retic Count, Absolute: 54.9 10*3/uL (ref 19.0–186.0)
Retic Ct Pct: 1.1 % (ref 0.4–3.1)

## 2018-07-11 LAB — LACTATE DEHYDROGENASE: LDH: 343 U/L — ABNORMAL HIGH (ref 98–192)

## 2018-07-11 LAB — SEDIMENTATION RATE: Sed Rate: 4 mm/hr (ref 0–22)

## 2018-07-11 NOTE — Progress Notes (Signed)
HEMATOLOGY/ONCOLOGY CONSULTATION NOTE  Date of Service: 07/11/18    Patient Care Team: Lawerance Cruel, MD as PCP - General (Family Medicine)  CHIEF COMPLAINTS/PURPOSE OF CONSULTATION:  Concern for Lymphoma - generalized lymphadenopathy.  HISTORY OF PRESENTING ILLNESS:   Alexis Murphy is a wonderful 73 y.o. 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 The pt reports that she is doing well overall. She has seen dermatology and rheumatology with work up of her sarcoidosis. She is seeing Dr Christinia Gully in one month in Pulmonology.   She notes that her skin changes first appeared 10 years ago.   The pt reports that her recent nose bx with [facility] a week ago revealed sarcoidosis and her right foot bx revealed stasis dermatitis. She has been diagnosed with sarcoidosis before this. She began a 44m prednisone 432-monthaper, three weeks ago with Dr. ErMarella Chimesand is also taking Plaquenil for her sarcoidosis. She had a CT on 09/19/17 to properly work up her sarcoidosis diagnosis.   She notes that prior to taking steroids her feet were very painful and also had some neuropathy. She also had night sweats and leg and ankle swelling but did not have fatigue or shortness of breath. These positive symptoms have resolved after beginning Prednisone.   She denies any breast symptoms. She has a MMG on 08/05/2017 which showed : IMPRESSION: New suspicious left breast mass 7:00 position retroareolar measuring 0.6 cm on ultrasound. Malignancy cannot be excluded. The mass does have similar imaging appearance to the previously biopsied left breast mass (January 2017, which demonstrated granulomatous inflammation and no evidence of malignancy at pathology.) The possibility of a second area of granulomatous inflammation is considered.  Borderline diffuse cortical thickening of a left axillary lymph node. On review of  the patient's prior CT a of the chest, patient does have mild diffuse lymphadenopathy in the thorax. At this time, axillary lymph node biopsy is not felt warranted.  The patient was given a copy of her pathology report from her left breast biopsy from January 2017. I told her she may want to share this information with her rheumatologist.  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   Of note prior to the patient's visit today, pt has had CT Chest completed on 09/19/17 with results revealing 1. Moderate mediastinal and right supraclavicular lymphadenopathy, increased. New mild bilateral axillary and bilateral hilar lymphadenopathy. Findings are worrisome for a malignant process such as a lymphoproliferative disorder or metastatic nodal disease. The right supraclavicular node may be (but not definitely) accessible for percutaneous biopsy. 2. Numerous similar-appearing subsolid spiculated pulmonary nodules scattered throughout both lungs, upper lobe predominant, largest 1.6 cm, stable to mildly increased in size and slightly increased in density in the interval. These are indeterminate, with multifocal lung adenocarcinoma on the differential. 3. Several new subcentimeter hypodense splenic lesions. Solitary small hypodense posterior right liver lobe lesion. These lesions are indeterminate, with lymphoproliferative disorder or metastatic disease not excluded. Options include further characterization with MRI abdomen without and with IV contrast or short-term follow-up CT abdomen with IV contrast in 3 months. 4. Chronic findings include: Left main and 3 vessel coronary atherosclerosis. Small hiatal hernia. Cholelithiasis.   Most recent lab results (03/10/17) of CBC  is as follows: all values are WNL except for WBC at 17.5k, MCV at 75.8, MCH at 25.2, RDW at 17.6, Platelets at 804k.  On review of systems, pt  reports dry mouth, decreased leg swelling, and denies tingling, numbness in  her hands or feet, dry eyes, problems breathing, cough, SOB, CP, nose bleeds, fatigue, abdominal pains, unexpected weight loss, fevers, chills, night sweats.   On PMHx the pt reports sarcoidosis, Bell's palsy twice.  On Social Hx the pt reports that she smoked cigarettes for 45 years. She quit smoking cigarettes 10 years ago but continues to smoke marijuana occasionally.  On Family Hx the pt reports maternal lupus.  Interval History:   Alexis Murphy returns today regarding her concern for lymphoma. The patient's last visit with [place] was on 12/31/17. The pt reports that she is doing well overall.   In the interim, the patient's PLT were seen to be increased to 1255k at her Rheumatologist's office yesterday. The pt is currently taking 103m Methotrexate and a Prednisone taper currently. She will be taking 531mMethotrexate starting next week. The pt denies having any scans since June. 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 She denies any symptoms in her eyes, but "simply wanted new glasses."  The pt reports that she feels "fantastic." She denies any recent falls, surgeries, sustained trauma, or other injuries. She notes that her joint pains are much improved, and that "everything is fine."  The pt quit smoking cigarettes 10 years ago this month.   Lab results (07/08/18) of CBC w/diff and CMP is as follows: all values are WNL except for WBC at 15.1k, MCH at 26.0, RDW at 18.8, PLT at 1255k, ANC at 12.9k, Immature Grans abs at 200, Albumin at 4.9, Glucose at 121, Potassium at 5.4, Creatinine at 1.13, GFR at 57.  On review of systems, pt reports good energy levels, eating well, moving her bowels well, and denies joint pains, noticing any new lumps or bumps, leg swelling, abdominal pains, chest pain, lower abdominal pains, and any other symptoms.   MEDICAL HISTORY:  Past  Medical History:  Diagnosis Date  . Arthritis   . Hyperlipidemia     SURGICAL HISTORY: Past Surgical History:  Procedure Laterality Date  . ABDOMINAL HYSTERECTOMY      SOCIAL HISTORY: Social History   Socioeconomic History  . Marital status: Widowed    Spouse name: Not on file  . Number of children: Not on file  . Years of education: Not on file  . Highest education level: Not on file  Occupational History  . Not on file  Social Needs  . Financial resource strain: Not on file  . Food insecurity:    Worry: Not on file    Inability: Not on file  . Transportation needs:    Medical: Not on file    Non-medical: Not on file  Tobacco Use  . Smoking status: Former Smoker    Packs/day: 1.00    Years: 45.00    Pack years: 45.00    Types: Cigarettes    Last attempt to quit: 10/15/2007    Years since quitting: 10.7  . Smokeless tobacco: Never Used  Substance and Sexual Activity  . Alcohol use: Yes  . Drug use: No  . Sexual activity: Not on file  Lifestyle  . Physical activity:    Days per week: Not on file    Minutes per session: Not on file  . Stress: Not on file  Relationships  . Social connections:    Talks on phone: Not on file  Gets together: Not on file    Attends religious service: Not on file    Active member of club or organization: Not on file    Attends meetings of clubs or organizations: Not on file    Relationship status: Not on file  . Intimate partner violence:    Fear of current or ex partner: Not on file    Emotionally abused: Not on file    Physically abused: Not on file    Forced sexual activity: Not on file  Other Topics Concern  . Not on file  Social History Narrative  . Not on file    FAMILY HISTORY: Family History  Problem Relation Age of Onset  . Heart disease Father   . Heart disease Brother     ALLERGIES:  is allergic to penicillins and codeine.  MEDICATIONS:  Current Outpatient Medications  Medication Sig Dispense Refill    . acetaminophen (TYLENOL) 325 MG tablet Take 1 tablet (325 mg total) by mouth every 6 (six) hours. 30 tablet 0  . amLODipine (NORVASC) 5 MG tablet Take 5 mg by mouth every evening.     Marland Kitchen atorvastatin (LIPITOR) 40 MG tablet Take 40 mg by mouth every evening.     Marland Kitchen FOLBIC 2.5-25-2 MG TABS tablet Take 1 tablet by mouth daily.  3  . Melatonin 5 MG TABS Take 5 mg by mouth at bedtime.    . methotrexate (RHEUMATREX) 2.5 MG tablet 4 TABLETS ONCE WEEKLY ORALLY 30 DAYS  2  . omeprazole (PRILOSEC) 40 MG capsule Take 40 mg by mouth every evening.     . predniSONE (DELTASONE) 20 MG tablet Take 10 mg by mouth daily. Taking a weaning dose over 4 months.  0  . venlafaxine XR (EFFEXOR-XR) 150 MG 24 hr capsule Take 150 mg by mouth every evening.     . vitamin B-12 (CYANOCOBALAMIN) 1000 MCG tablet Take 1,000 mcg by mouth daily.     No current facility-administered medications for this visit.     REVIEW OF SYSTEMS:    A 10+ POINT REVIEW OF SYSTEMS WAS OBTAINED including neurology, dermatology, psychiatry, cardiac, respiratory, lymph, extremities, GI, GU, Musculoskeletal, constitutional, breasts, reproductive, HEENT.  All pertinent positives are noted in the HPI.  All others are negative.   PHYSICAL EXAMINATION: ECOG PERFORMANCE STATUS: 2 - Symptomatic, <50% confined to bed  Vitals:   07/11/18 1309  BP: (!) 171/86  Pulse: 100  Resp: 18  Temp: 98.1 F (36.7 C)  SpO2: 97%   Filed Weights   07/11/18 1309  Weight: 168 lb (76.2 kg)   .Body mass index is 31.74 kg/m.  GENERAL:alert, in no acute distress and comfortable SKIN: no acute rashes, no significant lesions EYES: conjunctiva are pink and non-injected, sclera anicteric OROPHARYNX: MMM, no exudates, no oropharyngeal erythema or ulceration NECK: supple, no JVD LYMPH:  no palpable lymphadenopathy in the cervical, axillary or inguinal regions LUNGS: clear to auscultation b/l with normal respiratory effort HEART: regular rate & rhythm ABDOMEN:   normoactive bowel sounds , non tender, not distended. Just palpable splenomegaly.  Extremity: no pedal edema PSYCH: alert & oriented x 3 with fluent speech NEURO: no focal motor/sensory deficits   LABORATORY DATA:  I have reviewed the data as listed  . CBC Latest Ref Rng & Units 11/28/2017 03/10/2017  WBC 3.9 - 10.3 K/uL 12.8(H) 17.5(H)  Hemoglobin 11.6 - 15.9 g/dL 12.3 12.5  Hematocrit 34.8 - 46.6 % 38.4 37.6  Platelets 145 - 400 K/uL 457(H) 804(H)    .  CMP Latest Ref Rng & Units 11/28/2017 10/29/2017 03/10/2017  Glucose 70 - 140 mg/dL 119 - 99  BUN 7 - 26 mg/dL 23 - 15  Creatinine 0.60 - 1.10 mg/dL 1.20(H) - 1.26(H)  Sodium 136 - 145 mmol/L 137 - 135  Potassium 3.5 - 5.1 mmol/L 4.4 - 3.5  Chloride 98 - 109 mmol/L 103 - 100(L)  CO2 22 - 29 mmol/L 25 - 21(L)  Calcium 8.4 - 10.4 mg/dL 9.4 - 9.3  Total Protein 6.4 - 8.3 g/dL 7.7 7.3 -  Total Bilirubin 0.2 - 1.2 mg/dL 0.4 0.4 -  Alkaline Phos 40 - 150 U/L 86 96 -  AST 5 - 34 U/L 17 19 -  ALT 0 - 55 U/L 18 14 -   Component     Latest Ref Rng & Units 11/28/2017  Iron     41 - 142 ug/dL 37 (L)  TIBC     236 - 444 ug/dL 399  Saturation Ratios     21 - 57 % 9 (L)  UIBC     ug/dL 362  Retic Ct Pct     0.7 - 2.1 % 1.7  RBC.     3.70 - 5.45 MIL/uL 4.89  Retic Count, Absolute     33.7 - 90.7 K/uL 83.1  Ferritin     9 - 269 ng/mL 12  Angiotensin-Converting Enzyme     14 - 82 U/L 70  Sed Rate     0 - 22 mm/hr 12  LDH     125 - 245 U/L 302 (H)    RADIOGRAPHIC STUDIES: I have personally reviewed the radiological images as listed and agreed with the findings in the report. No results found. CT chest 09/19/2017: IMPRESSION: 1. Moderate mediastinal and right supraclavicular lymphadenopathy, increased. New mild bilateral axillary and bilateral hilar lymphadenopathy. Findings are worrisome for a malignant process such as a lymphoproliferative disorder or metastatic nodal disease. The right supraclavicular node may be (but not  definitely) accessible for percutaneous biopsy. 2. Numerous similar-appearing subsolid spiculated pulmonary nodules scattered throughout both lungs, upper lobe predominant, largest 1.6 cm, stable to mildly increased in size and slightly increased in density in the interval. These are indeterminate, with multifocal lung adenocarcinoma on the differential. 3. Several new subcentimeter hypodense splenic lesions. Solitary small hypodense posterior right liver lobe lesion. These lesions are indeterminate, with lymphoproliferative disorder or metastatic disease not excluded. Options include further characterization with MRI abdomen without and with IV contrast or short-term follow-up CT abdomen with IV contrast in 3 months. 4. Chronic findings include: Left main and 3 vessel coronary atherosclerosis. Small hiatal hernia. Cholelithiasis.  Aortic Atherosclerosis (ICD10-I70.0).  Electronically Signed   By: Ilona Sorrel M.D.   On: 09/19/2017 11:54   ASSESSMENT & PLAN:  73 y.o. female with  1. Generalized lymphadenopathy with multiple pulmonary nodules CT Chest 09/19/2017: Moderate mediastinal and right supraclavicular lymphadenopathy, increased. New mild bilateral axillary and bilateral hilar lymphadenopathy. Findings are worrisome for a malignant process such as a lymphoproliferative disorder or metastatic nodal disease. The right supraclavicular node may be (but not definitely) accessible for percutaneous biopsy. 2. Numerous similar-appearing subsolid spiculated pulmonary nodules scattered throughout both lungs, upper lobe predominant, largest 1.6 cm, stable to mildly increased in size and slightly increased in density in the interval. These are indeterminate, with multifocal lung adenocarcinoma on the differential.  12/26/17 CT C/A/P revealed interval response to steroids in LNadenopathy   2. Breast - lesion - Bx -- granulomatous inflammation -consistent with  sarcoidosis.  3. Skin rash on  nose - Bx consistent with sarcoidosis.  4. Iron deficiency - no significant anemia . Lab Results  Component Value Date   IRON 37 (L) 11/28/2017   TIBC 399 11/28/2017   IRONPCTSAT 9 (L) 11/28/2017   (Iron and TIBC)  Lab Results  Component Value Date   FERRITIN 12 11/28/2017   PLAN - GI w/u - will defer to PCP -?GI involvement by sarcoidosis.  5. Thrombocytosis r/o Essential thrombocytosis. September 2018 PLT were at 804k, resoled to 457k on 11/28/17 Then on 07/08/18 labs, PLT increased to 1255k  PLAN -Discussed pt labwork from 07/08/18; PLT increased to 1255k, ANC at 12.9k -Pt has signs of clotting with recent retinal artery occlusion  -Proceed with CT Chest on 07/17/18 as ordered by [name], PA -Will order CT A/P, hopefully at same time as CT Chest -Review of the patient's labs in September 2018 revealed her PLT were elevated at 804k, which later normalized -Discussed that I would like to send additional blood tests today to evaluate her significant thrombocytosis -Discussed that there is also an indication to pursue a BM Bx given the significance of her thrombocytosis -Continue on 40m aspirin -Proceed with additional blood tests today -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 She was previously not at all keen to consider additional bx of her supraclavicular LN -the LNadneopathy (clinically improved on steroids) and pulmonary nodules also likely related to sarcoidosis. -patient symptoms have improved with prednisone and plaquenil that she is on from the the rheumatologist. -Continue follow up with dermatology, rheumatology and pulmonology. -Recommend PO Iron Polysaccharide -Will see the pt back in 2 weeks   -CT abd/pelvis on 07/17/2018 at [facility] alongwith CT chest ordered previously -Labs today -CT bone marrow biopsy on 07/15/2018 urgently -RTC with Dr KIrene Limbowith labs in 2  weeks   All of the patients questions were answered with apparent satisfaction. The patient knows to call the clinic with any problems, questions or concerns.  The total time spent in the appt was 30 minutes and more than 50% was on counseling and direct patient cares.     GSullivan LoneMD MS AAHIVMS SSelect Specialty Hospital Pittsbrgh UpmcCKeefe Memorial HospitalHematology/Oncology Physician CSt [facility] (Office):       [phone](Work cell):  [phone](Fax):           [phone] 07/11/2018 2:04 PM  I, SBaldwin Jamaica am acting as a scribe for Dr. GSullivan Lone   .I have reviewed the above documentation for accuracy and completeness, and I agree with the above. .Brunetta GeneraMD

## 2018-07-11 NOTE — Telephone Encounter (Signed)
Contacted by [name], PA and Caryl Pina, nurse at [facility]. Concern about patient's recent platelet count of 1255 and requesting Dr. Irene Limbo see patient as soon as possible. Requested and received faxes of lab results, last OV and med list. All given to MD. MD asked that patient be contacted to see if she could come in today. Contacted patient - she will come in at 12:40.

## 2018-07-11 NOTE — Telephone Encounter (Signed)
PRINTED AVS AND CALENDER OF UPCOMING APPOINTMENT. PER 1/3 LOS

## 2018-07-16 ENCOUNTER — Telehealth: Payer: Self-pay | Admitting: Hematology

## 2018-07-16 ENCOUNTER — Other Ambulatory Visit: Payer: Self-pay | Admitting: Hematology

## 2018-07-16 ENCOUNTER — Telehealth: Payer: Self-pay | Admitting: *Deleted

## 2018-07-16 DIAGNOSIS — D473 Essential (hemorrhagic) thrombocythemia: Secondary | ICD-10-CM

## 2018-07-16 DIAGNOSIS — D75839 Thrombocytosis, unspecified: Secondary | ICD-10-CM

## 2018-07-16 MED ORDER — HYDROXYUREA 300 MG PO CAPS: 300.0000 mg | ORAL_CAPSULE | Freq: Every day | ORAL | 0 refills | Status: DC

## 2018-07-16 NOTE — Telephone Encounter (Signed)
Attempted to contact patient per Dr. Grier Mitts request. Left voice mail:Low dose Hydroxyurea prescribed today. Please advise patient this is for increased platelets, take with food to decrease stomach upset. Also, biopsy w/scans ordered, should be contacting her to schedule soon, if not already scheduled.  Advised patient to contact office for questions or concerns.

## 2018-07-16 NOTE — Telephone Encounter (Signed)
FAXED RECORDS TO [facility] RELEASE ID [id number]

## 2018-07-17 ENCOUNTER — Inpatient Hospital Stay: Admission: RE | Admit: 2018-07-17 | Payer: Medicare HMO | Source: Ambulatory Visit

## 2018-07-17 ENCOUNTER — Ambulatory Visit
Admission: RE | Admit: 2018-07-17 | Discharge: 2018-07-17 | Disposition: A | Discharge status: Home / Self Care | Payer: Medicare HMO | Source: Ambulatory Visit | Attending: Physician Assistant | Admitting: Physician Assistant

## 2018-07-17 ENCOUNTER — Telehealth: Payer: Self-pay | Admitting: *Deleted

## 2018-07-17 ENCOUNTER — Ambulatory Visit
Admission: RE | Admit: 2018-07-17 | Discharge: 2018-07-17 | Disposition: A | Discharge status: Home / Self Care | Payer: Medicare HMO | Source: Ambulatory Visit | Attending: Hematology | Admitting: Hematology

## 2018-07-17 DIAGNOSIS — R599 Enlarged lymph nodes, unspecified: Secondary | ICD-10-CM | POA: Diagnosis not present

## 2018-07-17 DIAGNOSIS — R9389 Abnormal findings on diagnostic imaging of other specified body structures: Secondary | ICD-10-CM

## 2018-07-17 DIAGNOSIS — K449 Diaphragmatic hernia without obstruction or gangrene: Secondary | ICD-10-CM | POA: Diagnosis not present

## 2018-07-17 NOTE — Telephone Encounter (Signed)
Per Dr. Irene Limbo: Contact patient. Her lab tests are back and she needs a higher dose of Hydroxyurea. Please find out if she has picked up prescription for Hydroxyurea. If so, she needs to take 2 per day. If she has not, MD will call in higher dose. Attempted to contact patient: Left Voice Mail with above information. Asked patient to call office to let us know she received instructions and state whether or not she had picked up prescription.

## 2018-07-22 ENCOUNTER — Other Ambulatory Visit: Payer: Self-pay | Admitting: Radiology

## 2018-07-23 ENCOUNTER — Ambulatory Visit (HOSPITAL_COMMUNITY)
Admission: RE | Admit: 2018-07-23 | Discharge: 2018-07-23 | Disposition: A | Discharge status: Home / Self Care | Payer: Medicare HMO | Source: Ambulatory Visit | Attending: Hematology | Admitting: Hematology

## 2018-07-23 ENCOUNTER — Encounter (HOSPITAL_COMMUNITY): Payer: Self-pay | Admitting: *Deleted

## 2018-07-23 ENCOUNTER — Other Ambulatory Visit: Payer: Self-pay

## 2018-07-23 DIAGNOSIS — D471 Chronic myeloproliferative disease: Secondary | ICD-10-CM | POA: Diagnosis not present

## 2018-07-23 DIAGNOSIS — D473 Essential (hemorrhagic) thrombocythemia: Secondary | ICD-10-CM | POA: Diagnosis not present

## 2018-07-23 DIAGNOSIS — F17210 Nicotine dependence, cigarettes, uncomplicated: Secondary | ICD-10-CM | POA: Insufficient documentation

## 2018-07-23 DIAGNOSIS — Z01812 Encounter for preprocedural laboratory examination: Secondary | ICD-10-CM | POA: Insufficient documentation

## 2018-07-23 DIAGNOSIS — Z7901 Long term (current) use of anticoagulants: Secondary | ICD-10-CM | POA: Diagnosis not present

## 2018-07-23 DIAGNOSIS — R69 Illness, unspecified: Secondary | ICD-10-CM | POA: Diagnosis not present

## 2018-07-23 DIAGNOSIS — Z79899 Other long term (current) drug therapy: Secondary | ICD-10-CM | POA: Insufficient documentation

## 2018-07-23 DIAGNOSIS — D75839 Thrombocytosis, unspecified: Secondary | ICD-10-CM

## 2018-07-23 DIAGNOSIS — C946 Myelodysplastic disease, not classified: Secondary | ICD-10-CM | POA: Diagnosis not present

## 2018-07-23 HISTORY — DX: Sarcoidosis, unspecified: D86.9

## 2018-07-23 LAB — CBC WITH DIFFERENTIAL/PLATELET
ABS IMMATURE GRANULOCYTES: 0.07 10*3/uL (ref 0.00–0.07)
Basophils Absolute: 0.1 10*3/uL (ref 0.0–0.1)
Basophils Relative: 1 %
Eosinophils Absolute: 0.2 10*3/uL (ref 0.0–0.5)
Eosinophils Relative: 2 %
HCT: 36.8 % (ref 36.0–46.0)
Hemoglobin: 11.3 g/dL — ABNORMAL LOW (ref 12.0–15.0)
Immature Granulocytes: 1 %
Lymphocytes Relative: 7 %
Lymphs Abs: 0.9 10*3/uL (ref 0.7–4.0)
MCH: 25.3 pg — ABNORMAL LOW (ref 26.0–34.0)
MCHC: 30.7 g/dL (ref 30.0–36.0)
MCV: 82.5 fL (ref 80.0–100.0)
Monocytes Absolute: 0.6 10*3/uL (ref 0.1–1.0)
Monocytes Relative: 5 %
Neutro Abs: 10.6 10*3/uL — ABNORMAL HIGH (ref 1.7–7.7)
Neutrophils Relative %: 84 %
Platelets: 1022 10*3/uL (ref 150–400)
RBC: 4.46 MIL/uL (ref 3.87–5.11)
RDW: 19.9 % — ABNORMAL HIGH (ref 11.5–15.5)
WBC: 12.4 10*3/uL — ABNORMAL HIGH (ref 4.0–10.5)
nRBC: 0 % (ref 0.0–0.2)

## 2018-07-23 LAB — PROTIME-INR
INR: 0.93
Prothrombin Time: 12.4 seconds (ref 11.4–15.2)

## 2018-07-23 LAB — BASIC METABOLIC PANEL
Anion gap: 12 (ref 5–15)
BUN: 23 mg/dL (ref 8–23)
CO2: 23 mmol/L (ref 22–32)
Calcium: 8.9 mg/dL (ref 8.9–10.3)
Chloride: 106 mmol/L (ref 98–111)
Creatinine, Ser: 1.09 mg/dL — ABNORMAL HIGH (ref 0.44–1.00)
GFR calc Af Amer: 59 mL/min — ABNORMAL LOW (ref >60)
GFR calc non Af Amer: 51 mL/min — ABNORMAL LOW (ref >60)
GLUCOSE: 108 mg/dL — AB (ref 70–99)
Potassium: 3.3 mmol/L — ABNORMAL LOW (ref 3.5–5.1)
Sodium: 141 mmol/L (ref 135–145)

## 2018-07-23 MED ORDER — MIDAZOLAM HCL 2 MG/2ML IJ SOLN
INTRAMUSCULAR | Status: AC
  Filled 2018-07-23: qty 4

## 2018-07-23 MED ORDER — MIDAZOLAM HCL 2 MG/2ML IJ SOLN
INTRAMUSCULAR | Status: AC | PRN
  Administered 2018-07-23: 0.5 mg via INTRAVENOUS
  Administered 2018-07-23 (×2): 1 mg via INTRAVENOUS

## 2018-07-23 MED ORDER — SODIUM CHLORIDE 0.9 % IV SOLN
INTRAVENOUS | Status: DC
  Administered 2018-07-23: 08:00:00 via INTRAVENOUS

## 2018-07-23 MED ORDER — FENTANYL CITRATE (PF) 100 MCG/2ML IJ SOLN
INTRAMUSCULAR | Status: AC | PRN
  Administered 2018-07-23 (×2): 50 ug via INTRAVENOUS

## 2018-07-23 MED ORDER — FENTANYL CITRATE (PF) 100 MCG/2ML IJ SOLN
INTRAMUSCULAR | Status: AC
  Filled 2018-07-23: qty 4

## 2018-07-23 MED ORDER — NALOXONE HCL 0.4 MG/ML IJ SOLN
INTRAMUSCULAR | Status: AC
  Filled 2018-07-23: qty 1

## 2018-07-23 MED ORDER — FLUMAZENIL 0.5 MG/5ML IV SOLN
INTRAVENOUS | Status: AC
  Filled 2018-07-23: qty 5

## 2018-07-23 MED ORDER — LIDOCAINE HCL (PF) 1 % IJ SOLN
INTRAMUSCULAR | Status: AC | PRN
  Administered 2018-07-23: 10 mL

## 2018-07-23 NOTE — Progress Notes (Signed)
Pt was ready for discharge at 1145 (date pt can go per Dr. Anselm Pancoast). Nurse made multiple attempts to contact son for her ride home. Pt remains in room since patient had moderate sedation. Monitoring patient at this time. NAD noted.

## 2018-07-23 NOTE — H&P (Signed)
Chief Complaint: Patient was seen in consultation today for bone marrow biopsy at the request of Brunetta Genera  Referring Physician(s): Brunetta Genera  Supervising Physician: Markus Daft  Patient Status: Independence  History of Present Illness: Alexis Murphy is a 73 y.o. female being worked up for ongoing thrombocytosis. She is referred for bone marrow biopsy. PMHx, meds, labs, imaging, allergies reviewed. Feels well, no recent fevers, chills, illness. Has been NPO today as directed.    Past Medical History:  Diagnosis Date  . Arthritis   . Hyperlipidemia   . Sarcoidosis     Past Surgical History:  Procedure Laterality Date  . ABDOMINAL HYSTERECTOMY    . BREAST SURGERY      Allergies: Penicillins and Codeine  Medications: Prior to Admission medications   Medication Sig Start Date End Date Taking? Authorizing Provider  amLODipine (NORVASC) 5 MG tablet Take 5 mg by mouth every evening.  08/27/16  Yes [provider]  atorvastatin (LIPITOR) 40 MG tablet Take 40 mg by mouth every evening.  08/27/16  Yes [provider]  hydroxyurea (DROXIA) 300 MG capsule Take 1 capsule (300 mg total) by mouth daily. May take with food to minimize GI side effects. 07/16/18  Yes Brunetta Genera, MD  Melatonin 5 MG TABS Take 5 mg by mouth at bedtime.   Yes [provider]  omeprazole (PRILOSEC) 40 MG capsule Take 40 mg by mouth every evening.    Yes [provider]  predniSONE (DELTASONE) 20 MG tablet Take 7.5 mg by mouth daily. Taking a weaning dose over 4 months. 11/05/17  Yes [provider]  venlafaxine XR (EFFEXOR-XR) 150 MG 24 hr capsule Take 150 mg by mouth every evening.  08/27/16  Yes [provider]  acetaminophen (TYLENOL) 325 MG tablet Take 1 tablet (325 mg total) by mouth every 6 (six) hours. 03/10/17   Caccavale, Sophia, PA-C  FOLBIC 2.5-25-2 MG TABS tablet Take 1 tablet by mouth daily. 10/23/17   [provider]  methotrexate (RHEUMATREX) 2.5 MG tablet 4 TABLETS ONCE WEEKLY ORALLY 30 DAYS 05/01/18   [provider]  vitamin B-12 (CYANOCOBALAMIN) 1000 MCG tablet Take 1,000 mcg by mouth daily.    [provider]     Family History  Problem Relation Age of Onset  . Heart disease Father   . Heart disease Brother     Social History   Socioeconomic History  . Marital status: Widowed    Spouse name: Not on file  . Number of children: Not on file  . Years of education: Not on file  . Highest education level: Not on file  Occupational History  . Not on file  Social Needs  . Financial resource strain: Not on file  . Food insecurity:    Worry: Not on file    Inability: Not on file  . Transportation needs:    Medical: Not on file    Non-medical: Not on file  Tobacco Use  . Smoking status: Former Smoker    Packs/day: 1.00    Years: 45.00    Pack years: 45.00    Types: Cigarettes    Last attempt to quit: 10/15/2007    Years since quitting: 10.7  . Smokeless tobacco: Never Used  Substance and Sexual Activity  . Alcohol use: Yes  . Drug use: No  . Sexual activity: Not on file  Lifestyle  . Physical activity:    Days per week: Not on file  Minutes per session: Not on file  . Stress: Not on file  Relationships  . Social connections:    Talks on phone: Not on file    Gets together: Not on file    Attends religious service: Not on file    Active member of club or organization: Not on file    Attends meetings of clubs or organizations: Not on file    Relationship status: Not on file  Other Topics Concern  . Not on file  Social History Narrative  . Not on file     Review of Systems: A 12 point ROS discussed and pertinent positives are indicated in the HPI above.  All other systems are negative.  Review of Systems  Vital Signs: BP (!) 151/76   Pulse 96   Temp 98.7 F (37.1 C) (Oral)   SpO2 99%   Physical Exam Constitutional:       Appearance: Normal appearance.  HENT:     Mouth/Throat:     Mouth: Mucous membranes are moist.     Pharynx: Oropharynx is clear.  Cardiovascular:     Rate and Rhythm: Normal rate and regular rhythm.     Heart sounds: Normal heart sounds.  Pulmonary:     Effort: Pulmonary effort is normal. No respiratory distress.     Breath sounds: Normal breath sounds.  Neurological:     General: No focal deficit present.     Mental Status: She is alert and oriented to person, place, and time.  Psychiatric:        Mood and Affect: Mood normal.       Imaging: Ct Abdomen Pelvis Wo Contrast  Result Date: 07/17/2018 CLINICAL DATA:  Enlarged lymph nodes, splenomegaly, sarcoidosis. EXAM: CT CHEST, ABDOMEN AND PELVIS WITHOUT CONTRAST TECHNIQUE: Multidetector CT imaging of the chest, abdomen and pelvis was performed following the standard protocol without IV contrast. COMPARISON:  CT 12/26/2017 FINDINGS: CT CHEST FINDINGS Cardiovascular: Coronary artery calcification and aortic atherosclerotic calcification. Mediastinum/Nodes: No axillary or supraclavicular adenopathy. Continued decreased in size of mediastinal lymph nodes compared to CT 09/19/2017. Also there is a decrease in size from CT 12/26/2017. Remaining lymph nodes are borderline enlarged measuring 9 to10 mm. No hilar adenopathy. Moderate hiatal hernia. Lungs/Pleura: Mild scattered ground-glass densities. Improved nodularity in the RIGHT upper lobe. No new pulmonary nodules. Musculoskeletal: No aggressive osseous lesion. CT ABDOMEN AND PELVIS FINDINGS Hepatobiliary: Normal liver on noncontrast exam. Large gallstone within noninflamed gallbladder. The duct dilatation. Common bile. Pancreas: Pancreas is normal. No ductal dilatation. No pancreatic inflammation. Spleen: Spleen grossly normal on noncontrast exam. 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 Kidneys, ureters bladder normal. Stomach/Bowel: Stomach, small bowel, appendix,  and cecum are normal. The colon and rectosigmoid colon are normal. Vascular/Lymphatic: Abdominal aorta is normal caliber with atherosclerotic calcification. There is no retroperitoneal or periportal lymphadenopathy. No pelvic lymphadenopathy. Reproductive: Post hysterectomy anatomy Other: No free fluid. Musculoskeletal: No aggressive osseous lesion. IMPRESSION: Chest Impression: 1. Continue improvement in mediastinal adenopathy. Lymph nodes are now not pathologic by size criteria. 2. Continued improvement in nodular and ground-glass densities particularly in the RIGHT upper lobe. 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 Abdomen / Pelvis Impression: 1. Spleen is normal. 2. No abdominopelvic lymphadenopathy. 3. Moderate hiatal hernia Electronically Signed   By: Suzy Bouchard M.D.   On: 07/17/2018 15:43   Ct Chest Wo Contrast  Result Date: 07/17/2018 CLINICAL DATA:  Enlarged lymph nodes, splenomegaly, sarcoidosis. EXAM: CT CHEST, ABDOMEN AND PELVIS WITHOUT CONTRAST  TECHNIQUE: Multidetector CT imaging of the chest, abdomen and pelvis was performed following the standard protocol without IV contrast. COMPARISON:  CT 12/26/2017 FINDINGS: CT CHEST FINDINGS Cardiovascular: Coronary artery calcification and aortic atherosclerotic calcification. Mediastinum/Nodes: No axillary or supraclavicular adenopathy. Continued decreased in size of mediastinal lymph nodes compared to CT 09/19/2017. Also there is a decrease in size from CT 12/26/2017. Remaining lymph nodes are borderline enlarged measuring 9 to10 mm. No hilar adenopathy. Moderate hiatal hernia. Lungs/Pleura: Mild scattered ground-glass densities. Improved nodularity in the RIGHT upper lobe. No new pulmonary nodules. Musculoskeletal: No aggressive osseous lesion. CT ABDOMEN AND PELVIS FINDINGS Hepatobiliary: Normal liver on noncontrast exam. Large gallstone within noninflamed gallbladder. The duct dilatation. Common bile. Pancreas: Pancreas is  normal. No ductal dilatation. No pancreatic inflammation. Spleen: Spleen grossly normal on noncontrast exam. 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 Kidneys, ureters bladder normal. Stomach/Bowel: Stomach, small bowel, appendix, and cecum are normal. The colon and rectosigmoid colon are normal. Vascular/Lymphatic: Abdominal aorta is normal caliber with atherosclerotic calcification. There is no retroperitoneal or periportal lymphadenopathy. No pelvic lymphadenopathy. Reproductive: Post hysterectomy anatomy Other: No free fluid. Musculoskeletal: No aggressive osseous lesion. IMPRESSION: Chest Impression: 1. Continue improvement in mediastinal adenopathy. Lymph nodes are now not pathologic by size criteria. 2. Continued improvement in nodular and ground-glass densities particularly in the RIGHT upper lobe. 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 Abdomen / Pelvis Impression: 1. Spleen is normal. 2. No abdominopelvic lymphadenopathy. 3. Moderate hiatal hernia Electronically Signed   By: Suzy Bouchard M.D.   On: 07/17/2018 15:43    Labs:  CBC: Recent Labs    11/28/17 1251 07/11/18 1410  WBC 12.8* 16.8*  HGB 12.3 12.5  HCT 38.4 40.4  PLT 457* 1,057*    COAGS: Recent Labs    07/23/18 0733  INR 0.93    BMP: Recent Labs    11/28/17 1251 07/11/18 1410 07/23/18 0733  NA 137 141 141  K 4.4 4.2 3.3*  CL 103 105 106  CO2 25 23 23   GLUCOSE 119 103* 108*  BUN 23 20 23   CALCIUM 9.4 9.7 8.9  CREATININE 1.20* 1.74* 1.09*  GFRNONAA 44* 29* 51*  GFRAA 51* 34* 59*    LIVER FUNCTION TESTS: Recent Labs    10/29/17 1546 11/28/17 1251 07/11/18 1410  BILITOT 0.4 0.4 0.5  AST 19 17 21   ALT 14 18 22   ALKPHOS 96 86 84  PROT 7.3 7.7 7.5  ALBUMIN 4.4 4.5 4.7    TUMOR MARKERS: No results for input(s): AFPTM, CEA, CA199, CHROMGRNA in the last 8760 hours.  Assessment and Plan: Thrombocytosis For bone marrow biopsy Labs ok Risks and  benefits discussed with the patient including, but not limited to bleeding, infection, damage to adjacent structures or low yield requiring additional tests.  All of the patient's questions were answered, patient is agreeable to proceed. Consent signed and in chart.    Thank you for this interesting consult.  I greatly enjoyed meeting PENI RUPARD and look forward to participating in their care.  A copy of this report was sent to the requesting provider on this date.  Electronically Signed: Ascencion Dike, PA-C 07/23/2018, 8:07 AM   I spent a total of 20 minutes in face to face in clinical consultation, greater than 50% of which was counseling/coordinating care for bone marrow biopsy

## 2018-07-23 NOTE — Procedures (Signed)
Interventional Radiology Procedure:   Indications: Thrombocytosis  Procedure: CT guided bone marrow biopsy  Findings: 2 aspirates and 1 core from right ilium  Complications: None     EBL: Minimal  Plan: Bedrest 1 hour.     Jestina Stephani R. Anselm Pancoast, MD  Pager: [phone]

## 2018-07-23 NOTE — Discharge Instructions (Signed)
Bone Marrow Aspiration and Bone Marrow Biopsy, Adult, Care After This sheet gives you information about how to care for yourself after your procedure. Your health care provider may also give you more specific instructions. If you have problems or questions, contact your health care provider. What can I expect after the procedure? After the procedure, it is common to have:  Mild pain and tenderness.  Swelling.  Bruising. Follow these instructions at home: Puncture site care      Follow instructions from your health care provider about how to take care of the puncture site. Make sure you: ? Wash your hands with soap and water before you change your bandage (dressing). If soap and water are not available, use hand sanitizer. ? Change your dressing as told by your health care provider.  Check your puncture siteevery day for signs of infection. Check for: ? More redness, swelling, or pain. ? More fluid or blood. ? Warmth. ? Pus or a bad smell. General instructions  Take over-the-counter and prescription medicines only as told by your health care provider.  Do not take baths, swim, or use a hot tub until your health care provider approves. Ask if you can take a shower or have a sponge bath.  Return to your normal activities as told by your health care provider. Ask your health care provider what activities are safe for you.  Do not drive for 24 hours if you were given a medicine to help you relax (sedative) during your procedure.  Keep all follow-up visits as told by your health care provider. This is important. Contact a health care provider if:  Your pain is not controlled with medicine. Get help right away if:  You have a fever.  You have more redness, swelling, or pain around the puncture site.  You have more fluid or blood coming from the puncture site.  Your puncture site feels warm to the touch.  You have pus or a bad smell coming from the puncture site. These  symptoms may represent a serious problem that is an emergency. Do not wait to see if the symptoms will go away. Get medical help right away. Call your local emergency services (911 in the [place] Do not drive yourself to the hospital. Summary  After the procedure, it is common to have mild pain, tenderness, swelling, and bruising.  Follow instructions from your health care provider about how to take care of the puncture site.  Get help right away if you have any symptoms of infection or if you have more blood or fluid coming from the puncture site. This information is not intended to replace advice given to you by your health care provider. Make sure you discuss any questions you have with your health care provider. Document Released: 01/12/2005 Document Revised: 10/08/2017 Document Reviewed: 12/07/2015 Elsevier Interactive Patient Education  2019 [place].   Moderate Conscious Sedation, Adult, Care After These instructions provide you with information about caring for yourself after your procedure. Your health care provider may also give you more specific instructions. Your treatment has been planned according to current medical practices, but problems sometimes occur. Call your health care provider if you have any problems or questions after your procedure. What can I expect after the procedure? After your procedure, it is common:  To feel sleepy for several hours.  To feel clumsy and have poor balance for several hours.  To have poor judgment for several hours.  To vomit if you eat too soon. Follow  these instructions at home: °For at least 24 hours after the procedure: ° °· Do not: °? Participate in activities where you could fall or become injured. °? Drive. °? Use heavy machinery. °? Drink alcohol. °? Take sleeping pills or medicines that cause drowsiness. °? Make important decisions or sign legal documents. °? Take care of children on your own. °· Rest. °Eating and  drinking °· Follow the diet recommended by your health care provider. °· If you vomit: °? Drink water, juice, or soup when you can drink without vomiting. °? Make sure you have little or no nausea before eating solid foods. °General instructions °· Have a responsible adult stay with you until you are awake and alert. °· Take over-the-counter and prescription medicines only as told by your health care provider. °· If you smoke, do not smoke without supervision. °· Keep all follow-up visits as told by your health care provider. This is important. °Contact a health care provider if: °· You keep feeling nauseous or you keep vomiting. °· You feel light-headed. °· You develop a rash. °· You have a fever. °Get help right away if: °· You have trouble breathing. °This information is not intended to replace advice given to you by your health care provider. Make sure you discuss any questions you have with your health care provider. °Document Released: 04/15/2013 Document Revised: 11/28/2015 Document Reviewed: 10/15/2015 °Elsevier Interactive Patient Education © 2019 [employer] ° ° °

## 2018-07-23 NOTE — Progress Notes (Signed)
CRITICAL VALUE ALERT  Critical Value:  Platelets 1,022  Date & Time Notied:  07/23/18 at [place]  Provider Notified: Rowe Robert, PA  Orders Received/Actions taken: Compares to previous on 1/3-20. No orders at this time.

## 2018-07-24 LAB — JAK2 (INCLUDING V617F AND EXON 12), MPL,& CALR-NEXT GEN SEQ

## 2018-07-24 LAB — BCR ABL1 FISH (GENPATH)

## 2018-07-25 ENCOUNTER — Inpatient Hospital Stay: Payer: Medicare HMO

## 2018-07-25 ENCOUNTER — Inpatient Hospital Stay: Payer: Medicare HMO | Admitting: Hematology

## 2018-07-28 DIAGNOSIS — L259 Unspecified contact dermatitis, unspecified cause: Secondary | ICD-10-CM | POA: Diagnosis not present

## 2018-08-04 ENCOUNTER — Ambulatory Visit
Admission: RE | Admit: 2018-08-04 | Discharge: 2018-08-04 | Disposition: A | Discharge status: Home / Self Care | Payer: PPO | Source: Ambulatory Visit | Attending: Family Medicine | Admitting: Family Medicine

## 2018-08-04 DIAGNOSIS — Z1231 Encounter for screening mammogram for malignant neoplasm of breast: Secondary | ICD-10-CM

## 2018-08-05 ENCOUNTER — Encounter (HOSPITAL_COMMUNITY): Payer: Self-pay | Admitting: Hematology

## 2018-08-08 ENCOUNTER — Other Ambulatory Visit: Payer: Self-pay | Admitting: *Deleted

## 2018-08-08 DIAGNOSIS — I739 Peripheral vascular disease, unspecified: Secondary | ICD-10-CM

## 2018-08-10 ENCOUNTER — Other Ambulatory Visit: Payer: Self-pay | Admitting: Hematology

## 2018-08-11 ENCOUNTER — Telehealth: Payer: Self-pay | Admitting: Hematology

## 2018-08-11 NOTE — Telephone Encounter (Signed)
Called patietn per 1/31 sch message - no answer - left message for patient to call back to r/s missed appt.

## 2018-08-12 ENCOUNTER — Telehealth: Payer: Self-pay | Admitting: Hematology

## 2018-08-12 NOTE — Telephone Encounter (Signed)
Patient called to reschedule her missed appointment

## 2018-08-14 DIAGNOSIS — Z6831 Body mass index (BMI) 31.0-31.9, adult: Secondary | ICD-10-CM | POA: Diagnosis not present

## 2018-08-14 DIAGNOSIS — R590 Localized enlarged lymph nodes: Secondary | ICD-10-CM | POA: Diagnosis not present

## 2018-08-14 DIAGNOSIS — M25472 Effusion, left ankle: Secondary | ICD-10-CM | POA: Diagnosis not present

## 2018-08-14 DIAGNOSIS — L52 Erythema nodosum: Secondary | ICD-10-CM | POA: Diagnosis not present

## 2018-08-14 DIAGNOSIS — R9389 Abnormal findings on diagnostic imaging of other specified body structures: Secondary | ICD-10-CM | POA: Diagnosis not present

## 2018-08-14 DIAGNOSIS — E669 Obesity, unspecified: Secondary | ICD-10-CM | POA: Diagnosis not present

## 2018-08-14 DIAGNOSIS — I7300 Raynaud's syndrome without gangrene: Secondary | ICD-10-CM | POA: Diagnosis not present

## 2018-08-14 DIAGNOSIS — M25471 Effusion, right ankle: Secondary | ICD-10-CM | POA: Diagnosis not present

## 2018-08-14 DIAGNOSIS — L03119 Cellulitis of unspecified part of limb: Secondary | ICD-10-CM | POA: Diagnosis not present

## 2018-08-14 DIAGNOSIS — D869 Sarcoidosis, unspecified: Secondary | ICD-10-CM | POA: Diagnosis not present

## 2018-08-20 NOTE — Progress Notes (Signed)
HEMATOLOGY/ONCOLOGY CONSULTATION NOTE  Date of Service: 08/21/18    Patient Care Team: Lawerance Cruel, MD as PCP - General (Family Medicine)  CHIEF COMPLAINTS/PURPOSE OF CONSULTATION:  Newly diagnosed Jak2 MPN  HISTORY OF PRESENTING ILLNESS:   Alexis Murphy is a wonderful 73 y.o. 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 The pt reports that she is doing well overall. She has seen dermatology and rheumatology with work up of her sarcoidosis. She is seeing Dr Christinia Gully in one month in Pulmonology.   She notes that her skin changes first appeared 10 years ago.   The pt reports that her recent nose bx with [facility] a week ago revealed sarcoidosis and her right foot bx revealed stasis dermatitis. She has been diagnosed with sarcoidosis before this. She began a 79m prednisone 437-monthaper, three weeks ago with Dr. ErMarella Chimesand is also taking Plaquenil for her sarcoidosis. She had a CT on 09/19/17 to properly work up her sarcoidosis diagnosis.   She notes that prior to taking steroids her feet were very painful and also had some neuropathy. She also had night sweats and leg and ankle swelling but did not have fatigue or shortness of breath. These positive symptoms have resolved after beginning Prednisone.   She denies any breast symptoms. She has a MMG on 08/05/2017 which showed : IMPRESSION: New suspicious left breast mass 7:00 position retroareolar measuring 0.6 cm on ultrasound. Malignancy cannot be excluded. The mass does have similar imaging appearance to the previously biopsied left breast mass (January 2017, which demonstrated granulomatous inflammation and no evidence of malignancy at pathology.) The possibility of a second area of granulomatous inflammation is considered.  Borderline diffuse cortical thickening of a left axillary lymph node. On review of the patient's prior CT a of  the chest, patient does have mild diffuse lymphadenopathy in the thorax. At this time, axillary lymph node biopsy is not felt warranted.  The patient was given a copy of her pathology report from her left breast biopsy from January 2017. I told her she may want to share this information with her rheumatologist.  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   Of note prior to the patient's visit today, pt has had CT Chest completed on 09/19/17 with results revealing 1. Moderate mediastinal and right supraclavicular lymphadenopathy, increased. New mild bilateral axillary and bilateral hilar lymphadenopathy. Findings are worrisome for a malignant process such as a lymphoproliferative disorder or metastatic nodal disease. The right supraclavicular node may be (but not definitely) accessible for percutaneous biopsy. 2. Numerous similar-appearing subsolid spiculated pulmonary nodules scattered throughout both lungs, upper lobe predominant, largest 1.6 cm, stable to mildly increased in size and slightly increased in density in the interval. These are indeterminate, with multifocal lung adenocarcinoma on the differential. 3. Several new subcentimeter hypodense splenic lesions. Solitary small hypodense posterior right liver lobe lesion. These lesions are indeterminate, with lymphoproliferative disorder or metastatic disease not excluded. Options include further characterization with MRI abdomen without and with IV contrast or short-term follow-up CT abdomen with IV contrast in 3 months. 4. Chronic findings include: Left main and 3 vessel coronary atherosclerosis. Small hiatal hernia. Cholelithiasis.   Most recent lab results (03/10/17) of CBC  is as follows: all values are WNL except for WBC at 17.5k, MCV at 75.8, MCH at 25.2, RDW at 17.6, Platelets at 804k.  On review of systems, pt reports dry  mouth, decreased leg swelling, and denies tingling, numbness in her hands or feet, dry eyes,  problems breathing, cough, SOB, CP, nose bleeds, fatigue, abdominal pains, unexpected weight loss, fevers, chills, night sweats.   On PMHx the pt reports sarcoidosis, Bell's palsy twice.  On Social Hx the pt reports that she smoked cigarettes for 45 years. She quit smoking cigarettes 10 years ago but continues to smoke marijuana occasionally.  On Family Hx the pt reports maternal lupus.  Interval History:   Alexis Murphy returns today regarding her concern for lymphoma. The patient's last visit with [place] was on 07/11/18. The pt reports that she is doing well overall.   The pt reports that she has been taking 323m Hydroxyurea in the interim without any complaints. She ran out of this medication 3 days ago. She notes that she continues on 120mMethotrexate per week.   The pt notes that she has had fine energy levels but is processing the recent passing of her partner. She notes that she feels that she has been able to process her grief and continues to do so.  Of note prior to today's visit, the pt had a CT C/A/P on 07/17/18 which revealed Continue improvement in mediastinal adenopathy. Lymph nodes are now not pathologic by size criteria. 2. Continued improvement in nodular and ground-glass densities particularly in the RIGHT upper lobe. 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 Abdomen / Pelvis Impression: 1. Spleen is normal. 2. No abdominopelvic lymphadenopathy. 3. Moderate hiatal hernia  Lab results today (08/21/18) of CBC w/diff and CMP is as follows: all values are WNL except for WBC at 11.0k, HGB at 11.6, MCH at 25.6, RDW at 21.6, PLT at 733k, ANC at 9.1k, Creatinine at 1.47, GFR at 36.  On review of systems, pt reports good energy levels, recent familial stress, and denies fevers, chills, night sweats, mouth sores, and any other symptoms.   MEDICAL HISTORY:  Past Medical History:  Diagnosis Date  . Arthritis   . Hyperlipidemia   . Sarcoidosis     SURGICAL  HISTORY: Past Surgical History:  Procedure Laterality Date  . ABDOMINAL HYSTERECTOMY    . BREAST BIOPSY    . BREAST SURGERY    . REDUCTION MAMMAPLASTY      SOCIAL HISTORY: Social History   Socioeconomic History  . Marital status: Widowed    Spouse name: Not on file  . Number of children: Not on file  . Years of education: Not on file  . Highest education level: Not on file  Occupational History  . Not on file  Social Needs  . Financial resource strain: Not on file  . Food insecurity:    Worry: Not on file    Inability: Not on file  . Transportation needs:    Medical: Not on file    Non-medical: Not on file  Tobacco Use  . Smoking status: Former Smoker    Packs/day: 1.00    Years: 45.00    Pack years: 45.00    Types: Cigarettes    Last attempt to quit: 10/15/2007    Years since quitting: 10.8  . Smokeless tobacco: Never Used  Substance and Sexual Activity  . Alcohol use: Yes  . Drug use: No  . Sexual activity: Not on file  Lifestyle  . Physical activity:    Days per week: Not on file    Minutes per session: Not on file  . Stress: Not on file  Relationships  . Social connections:  Talks on phone: Not on file    Gets together: Not on file    Attends religious service: Not on file    Active member of club or organization: Not on file    Attends meetings of clubs or organizations: Not on file    Relationship status: Not on file  . Intimate partner violence:    Fear of current or ex partner: Not on file    Emotionally abused: Not on file    Physically abused: Not on file    Forced sexual activity: Not on file  Other Topics Concern  . Not on file  Social History Narrative  . Not on file    FAMILY HISTORY: Family History  Problem Relation Age of Onset  . Heart disease Father   . Heart disease Brother     ALLERGIES:  is allergic to penicillins and codeine.  MEDICATIONS:  Current Outpatient Medications  Medication Sig Dispense Refill  . acetaminophen  (TYLENOL) 325 MG tablet Take 1 tablet (325 mg total) by mouth every 6 (six) hours. 30 tablet 0  . amLODipine (NORVASC) 5 MG tablet Take 5 mg by mouth every evening.     Marland Kitchen atorvastatin (LIPITOR) 40 MG tablet Take 40 mg by mouth every evening.     Marland Kitchen FOLBIC 2.5-25-2 MG TABS tablet Take 1 tablet by mouth daily.  3  . hydroxyurea (DROXIA) 300 MG capsule Take 1 capsule (300 mg total) by mouth daily. May take with food to minimize GI side effects. 30 capsule 0  . Melatonin 5 MG TABS Take 5 mg by mouth at bedtime.    . methotrexate (RHEUMATREX) 2.5 MG tablet 4 TABLETS ONCE WEEKLY ORALLY 30 DAYS  2  . omeprazole (PRILOSEC) 40 MG capsule Take 40 mg by mouth every evening.     . predniSONE (DELTASONE) 20 MG tablet Take 7.5 mg by mouth daily. Taking a weaning dose over 4 months.  0  . venlafaxine XR (EFFEXOR-XR) 150 MG 24 hr capsule Take 150 mg by mouth every evening.     . vitamin B-12 (CYANOCOBALAMIN) 1000 MCG tablet Take 1,000 mcg by mouth daily.     No current facility-administered medications for this visit.     REVIEW OF SYSTEMS:    A 10+ POINT REVIEW OF SYSTEMS WAS OBTAINED including neurology, dermatology, psychiatry, cardiac, respiratory, lymph, extremities, GI, GU, Musculoskeletal, constitutional, breasts, reproductive, HEENT.  All pertinent positives are noted in the HPI.  All others are negative.   PHYSICAL EXAMINATION: ECOG PERFORMANCE STATUS: 2 - Symptomatic, <50% confined to bed  Vitals:   08/21/18 1430  BP: (!) 176/83  Pulse: (!) 106  Resp: 18  Temp: 97.7 F (36.5 C)  SpO2: 95%   Filed Weights   08/21/18 1430  Weight: 164 lb 12.8 oz (74.8 kg)   .Body mass index is 31.14 kg/m.   GENERAL:alert, in no acute distress and comfortable SKIN: no acute rashes, no significant lesions EYES: conjunctiva are pink and non-injected, sclera anicteric OROPHARYNX: MMM, no exudates, no oropharyngeal erythema or ulceration NECK: supple, no JVD LYMPH:  no palpable lymphadenopathy in the  cervical, axillary or inguinal regions LUNGS: clear to auscultation b/l with normal respiratory effort HEART: regular rate & rhythm ABDOMEN:  normoactive bowel sounds , non tender, not distended. Just palpable splenomegaly.  Extremity: no pedal edema PSYCH: alert & oriented x 3 with fluent speech NEURO: no focal motor/sensory deficits   LABORATORY DATA:  I have reviewed the data as listed  . CBC Latest  Ref Rng & Units 08/21/2018 07/23/2018 07/11/2018  WBC 4.0 - 10.5 K/uL 11.0(H) 12.4(H) 16.8(H)  Hemoglobin 12.0 - 15.0 g/dL 11.6(L) 11.3(L) 12.5  Hematocrit 36.0 - 46.0 % 37.4 36.8 40.4  Platelets 150 - 400 K/uL 733(H) 1,022(HH) 1,057(H)    . CMP Latest Ref Rng & Units 08/21/2018 07/23/2018 07/11/2018  Glucose 70 - 99 mg/dL 91 108(H) 103(H)  BUN 8 - 23 mg/dL _0 Creatinine 0.44 - 1.00 mg/dL 1.47(H) 1.09(H) 1.74(H)  Sodium 135 - 145 mmol/L 142 141 141  Potassium 3.5 - 5.1 mmol/L 3.9 3.3(L) 4.2  Chloride 98 - 111 mmol/L 106 106 105  CO2 22 - 32 mmol/L _1 Calcium 8.9 - 10.3 mg/dL 9.4 8.9 9.7  Total Protein 6.5 - 8.1 g/dL 7.3 - 7.5  Total Bilirubin 0.3 - 1.2 mg/dL 0.6 - 0.5  Alkaline Phos 38 - 126 U/L 85 - 84  AST 15 - 41 U/L 31 - 21  ALT 0 - 44 U/L 43 - 22   Component     Latest Ref Rng & Units 11/28/2017  Iron     41 - 142 ug/dL 37 (L)  TIBC     236 - 444 ug/dL 399  Saturation Ratios     21 - 57 % 9 (L)  UIBC     ug/dL 362  Retic Ct Pct     0.7 - 2.1 % 1.7  RBC.     3.70 - 5.45 MIL/uL 4.89  Retic Count, Absolute     33.7 - 90.7 K/uL 83.1  Ferritin     9 - 269 ng/mL 12  Angiotensin-Converting Enzyme     14 - 82 U/L 70  Sed Rate     0 - 22 mm/hr 12  LDH     125 - 245 U/L 302 (H)          RADIOGRAPHIC STUDIES: I have personally reviewed the radiological images as listed and agreed with the findings in the report. Ct Biopsy  Result Date: 07/23/2018 INDICATION: 73 year old with thrombocytosis.  Request for bone marrow biopsy. EXAM: CT GUIDED BONE  MARROW ASPIRATES AND BIOPSY Physician: Stephan Minister. Anselm Pancoast, MD MEDICATIONS: Sedation medication one. ANESTHESIA/SEDATION: Fentanyl 100 mcg IV; Versed 2.5 mg IV Moderate Sedation Time:  15 minutes The patient was continuously monitored during the procedure by the interventional radiology nurse under my direct supervision. COMPLICATIONS: None immediate. PROCEDURE: The procedure was explained to the patient. The risks and benefits of the procedure were discussed and the patient's questions were addressed. Informed consent was obtained from the patient. The patient was placed prone on CT table. Images of the pelvis were obtained. The right side of back was prepped and draped in sterile fashion. Maximal barrier sterile technique was utilized including caps, mask, sterile gowns, sterile gloves, sterile drape, hand hygiene and skin antiseptic. The skin and right posterior ilium were anesthetized with 1% lidocaine. Coaxial bone needle was directed into the right ilium with CT guidance. Two aspirates were obtained. Core biopsy was obtained using the OnControl bone drill. Bandage placed over the puncture site. FINDINGS: Bone needle directed into the right ilium. Adequate aspirates and core biopsy were obtained. IMPRESSION: CT guided bone marrow aspiration and core biopsy. Electronically Signed   By: Markus Daft M.D.   On: 07/23/2018 12:12   Ct Bone Marrow Biopsy & Aspiration  Result Date: 07/23/2018 INDICATION: 73 year old with thrombocytosis.  Request for bone marrow biopsy. EXAM: CT GUIDED BONE MARROW ASPIRATES AND BIOPSY  Physician: Stephan Minister. Anselm Pancoast, MD MEDICATIONS: Sedation medication one. ANESTHESIA/SEDATION: Fentanyl 100 mcg IV; Versed 2.5 mg IV Moderate Sedation Time:  15 minutes The patient was continuously monitored during the procedure by the interventional radiology nurse under my direct supervision. COMPLICATIONS: None immediate. PROCEDURE: The procedure was explained to the patient. The risks and benefits of the procedure  were discussed and the patient's questions were addressed. Informed consent was obtained from the patient. The patient was placed prone on CT table. Images of the pelvis were obtained. The right side of back was prepped and draped in sterile fashion. Maximal barrier sterile technique was utilized including caps, mask, sterile gowns, sterile gloves, sterile drape, hand hygiene and skin antiseptic. The skin and right posterior ilium were anesthetized with 1% lidocaine. Coaxial bone needle was directed into the right ilium with CT guidance. Two aspirates were obtained. Core biopsy was obtained using the OnControl bone drill. Bandage placed over the puncture site. FINDINGS: Bone needle directed into the right ilium. Adequate aspirates and core biopsy were obtained. IMPRESSION: CT guided bone marrow aspiration and core biopsy. Electronically Signed   By: Markus Daft M.D.   On: 07/23/2018 12:12   Mm 3d Screen Breast Bilateral  Result Date: 08/05/2018 CLINICAL DATA:  Screening. EXAM: DIGITAL SCREENING BILATERAL MAMMOGRAM WITH TOMO AND CAD COMPARISON:  Previous exam(s). ACR Breast Density Category a: The breast tissue is almost entirely fatty. FINDINGS: There are no findings suspicious for malignancy. Images were processed with CAD. IMPRESSION: No mammographic evidence of malignancy. A result letter of this screening mammogram will be mailed directly to the patient. RECOMMENDATION: Screening mammogram in one year. (Code:[id number]-[id number]) BI-RADS CATEGORY  1: Negative. Electronically Signed   By: Claudie Revering M.D.   On: 08/05/2018 09:24   CT chest 09/19/2017: IMPRESSION: 1. Moderate mediastinal and right supraclavicular lymphadenopathy, increased. New mild bilateral axillary and bilateral hilar lymphadenopathy. Findings are worrisome for a malignant process such as a lymphoproliferative disorder or metastatic nodal disease. The right supraclavicular node may be (but not definitely) accessible for percutaneous biopsy. 2.  Numerous similar-appearing subsolid spiculated pulmonary nodules scattered throughout both lungs, upper lobe predominant, largest 1.6 cm, stable to mildly increased in size and slightly increased in density in the interval. These are indeterminate, with multifocal lung adenocarcinoma on the differential. 3. Several new subcentimeter hypodense splenic lesions. Solitary small hypodense posterior right liver lobe lesion. These lesions are indeterminate, with lymphoproliferative disorder or metastatic disease not excluded. Options include further characterization with MRI abdomen without and with IV contrast or short-term follow-up CT abdomen with IV contrast in 3 months. 4. Chronic findings include: Left main and 3 vessel coronary atherosclerosis. Small hiatal hernia. Cholelithiasis.  Aortic Atherosclerosis (ICD10-I70.0).  Electronically Signed   By: Ilona Sorrel M.D.   On: 09/19/2017 11:54   ASSESSMENT & PLAN:   73 y.o. female with  1. Generalized lymphadenopathy with multiple pulmonary nodules CT Chest 09/19/2017: Moderate mediastinal and right supraclavicular lymphadenopathy, increased. New mild bilateral axillary and bilateral hilar lymphadenopathy. Findings are worrisome for a malignant process such as a lymphoproliferative disorder or metastatic nodal disease. The right supraclavicular node may be (but not definitely) accessible for percutaneous biopsy. 2. Numerous similar-appearing subsolid spiculated pulmonary nodules scattered throughout both lungs, upper lobe predominant, largest 1.6 cm, stable to mildly increased in size and slightly increased in density in the interval. These are indeterminate, with multifocal lung adenocarcinoma on the differential.  12/26/17 CT C/A/P revealed interval response to steroids in LNadenopathy   2. Breast -  lesion - Bx -- granulomatous inflammation -consistent with sarcoidosis.  3. Skin rash on nose - Bx consistent with sarcoidosis.  4. Iron  deficiency - no significant anemia . Lab Results  Component Value Date   IRON 37 (L) 11/28/2017   TIBC 399 11/28/2017   IRONPCTSAT 9 (L) 11/28/2017   (Iron and TIBC)  Lab Results  Component Value Date   FERRITIN 12 11/28/2017   PLAN - GI w/u - will defer to PCP -?GI involvement by sarcoidosis.  5. Newly Diagnosed Jak2 mutation positive Essential thrombocytosis. 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 Lymph nodes are now not pathologic by size criteria. 2. Continued improvement in nodular and ground-glass densities particularly in the RIGHT upper lobe. 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 -The pt has no prohibitive toxicities from continuing Hydroxyurea, and will increase to 529m at this time.   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 She was previously not at all keen to consider additional bx of her supraclavicular LN -the LNadneopathy (clinically improved on steroids) and pulmonary nodules also likely related to  sarcoidosis. -patient symptoms have improved with prednisone and MTX that she is on from the the rheumatologist. -Continue follow up with dermatology, rheumatology and pulmonology. -Recommend PO Iron Polysaccharide -Will see the pt back in 4-6 weeks   RTC with Dr KaIrene Limboith labs in 6 weeks    All of the patients questions were answered with apparent satisfaction. The patient knows to call the clinic with any problems, questions or concerns.  The total time spent in the appt was 25 minutes and more than 50% was on counseling and direct patient cares.    GaSullivan LoneD MS AAHIVMS SCCoastal Endoscopy Center LLCTPrisma Health Richlandematology/Oncology Physician CoBanner - University [facility](Office):       [phone] cell):  [phone]):           [phone]/13/2020 2:58 PM  I, ScBaldwin Jamaicaam acting as a scribe for Dr. GaSullivan Lone  .I have reviewed the above documentation for accuracy and completeness, and I agree with the above. .GBrunetta GeneraD

## 2018-08-21 ENCOUNTER — Inpatient Hospital Stay: Payer: Medicare HMO | Attending: Hematology

## 2018-08-21 ENCOUNTER — Inpatient Hospital Stay: Payer: Medicare HMO | Admitting: Hematology

## 2018-08-21 VITALS — BP 176/83 | HR 106 | Temp 97.7°F | Resp 18 | Ht 61.0 in | Wt 164.8 lb

## 2018-08-21 DIAGNOSIS — R918 Other nonspecific abnormal finding of lung field: Secondary | ICD-10-CM | POA: Insufficient documentation

## 2018-08-21 DIAGNOSIS — R591 Generalized enlarged lymph nodes: Secondary | ICD-10-CM | POA: Diagnosis not present

## 2018-08-21 DIAGNOSIS — Z79899 Other long term (current) drug therapy: Secondary | ICD-10-CM

## 2018-08-21 DIAGNOSIS — I739 Peripheral vascular disease, unspecified: Secondary | ICD-10-CM

## 2018-08-21 DIAGNOSIS — N63 Unspecified lump in unspecified breast: Secondary | ICD-10-CM | POA: Diagnosis not present

## 2018-08-21 DIAGNOSIS — Z87891 Personal history of nicotine dependence: Secondary | ICD-10-CM | POA: Insufficient documentation

## 2018-08-21 DIAGNOSIS — D473 Essential (hemorrhagic) thrombocythemia: Secondary | ICD-10-CM | POA: Diagnosis not present

## 2018-08-21 LAB — CBC WITH DIFFERENTIAL (CANCER CENTER ONLY)
Abs Immature Granulocytes: 0.05 10*3/uL (ref 0.00–0.07)
Basophils Absolute: 0.1 10*3/uL (ref 0.0–0.1)
Basophils Relative: 1 %
Eosinophils Absolute: 0.2 10*3/uL (ref 0.0–0.5)
Eosinophils Relative: 2 %
HCT: 37.4 % (ref 36.0–46.0)
HEMOGLOBIN: 11.6 g/dL — AB (ref 12.0–15.0)
Immature Granulocytes: 1 %
Lymphocytes Relative: 10 %
Lymphs Abs: 1.1 10*3/uL (ref 0.7–4.0)
MCH: 25.6 pg — ABNORMAL LOW (ref 26.0–34.0)
MCHC: 31 g/dL (ref 30.0–36.0)
MCV: 82.4 fL (ref 80.0–100.0)
MONO ABS: 0.5 10*3/uL (ref 0.1–1.0)
Monocytes Relative: 4 %
NEUTROS ABS: 9.1 10*3/uL — AB (ref 1.7–7.7)
Neutrophils Relative %: 82 %
Platelet Count: 733 10*3/uL — ABNORMAL HIGH (ref 150–400)
RBC: 4.54 MIL/uL (ref 3.87–5.11)
RDW: 21.6 % — ABNORMAL HIGH (ref 11.5–15.5)
WBC Count: 11 10*3/uL — ABNORMAL HIGH (ref 4.0–10.5)
nRBC: 0 % (ref 0.0–0.2)

## 2018-08-21 LAB — CMP (CANCER CENTER ONLY)
ALT: 43 U/L (ref 0–44)
AST: 31 U/L (ref 15–41)
Albumin: 4.6 g/dL (ref 3.5–5.0)
Alkaline Phosphatase: 85 U/L (ref 38–126)
Anion gap: 12 (ref 5–15)
BUN: 18 mg/dL (ref 8–23)
CO2: 24 mmol/L (ref 22–32)
Calcium: 9.4 mg/dL (ref 8.9–10.3)
Chloride: 106 mmol/L (ref 98–111)
Creatinine: 1.47 mg/dL — ABNORMAL HIGH (ref 0.44–1.00)
GFR, Est AFR Am: 41 mL/min — ABNORMAL LOW (ref >60)
GFR, Estimated: 36 mL/min — ABNORMAL LOW (ref >60)
Glucose, Bld: 91 mg/dL (ref 70–99)
Potassium: 3.9 mmol/L (ref 3.5–5.1)
Sodium: 142 mmol/L (ref 135–145)
Total Bilirubin: 0.6 mg/dL (ref 0.3–1.2)
Total Protein: 7.3 g/dL (ref 6.5–8.1)

## 2018-08-21 LAB — RETIC PANEL
Immature Retic Fract: 24.4 % — ABNORMAL HIGH (ref 2.3–15.9)
RBC.: 4.54 MIL/uL (ref 3.87–5.11)
Retic Count, Absolute: 43.1 10*3/uL (ref 19.0–186.0)
Retic Ct Pct: 1 % (ref 0.4–3.1)
Reticulocyte Hemoglobin: 34.5 pg (ref >27.9)

## 2018-08-26 ENCOUNTER — Other Ambulatory Visit: Payer: Self-pay | Admitting: Hematology

## 2018-08-27 ENCOUNTER — Other Ambulatory Visit: Payer: Self-pay | Admitting: Hematology

## 2018-08-27 ENCOUNTER — Telehealth: Payer: Self-pay | Admitting: *Deleted

## 2018-08-27 MED ORDER — HYDROXYUREA 500 MG PO CAPS: 500.0000 mg | ORAL_CAPSULE | Freq: Every day | ORAL | 2 refills | Status: DC

## 2018-08-27 NOTE — Telephone Encounter (Signed)
Patient left voice mail stating Dr. Irene Limbo told her at 2/13 appt that her Hydroxyurea was going to be increased. No new prescription received at her pharmacy. Message sent to Dr. Irene Limbo.

## 2018-08-27 NOTE — Telephone Encounter (Signed)
Contacted patient to let her know that new Hydroxyurea prescription sent to pharmacy. Patient verbalized understanding.

## 2018-09-02 DIAGNOSIS — Z0000 Encounter for general adult medical examination without abnormal findings: Secondary | ICD-10-CM | POA: Diagnosis not present

## 2018-09-02 DIAGNOSIS — I7300 Raynaud's syndrome without gangrene: Secondary | ICD-10-CM | POA: Diagnosis not present

## 2018-09-02 DIAGNOSIS — H34239 Retinal artery branch occlusion, unspecified eye: Secondary | ICD-10-CM | POA: Diagnosis not present

## 2018-09-02 DIAGNOSIS — E7800 Pure hypercholesterolemia, unspecified: Secondary | ICD-10-CM | POA: Diagnosis not present

## 2018-09-02 DIAGNOSIS — N183 Chronic kidney disease, stage 3 (moderate): Secondary | ICD-10-CM | POA: Diagnosis not present

## 2018-09-02 DIAGNOSIS — R030 Elevated blood-pressure reading, without diagnosis of hypertension: Secondary | ICD-10-CM | POA: Diagnosis not present

## 2018-09-02 DIAGNOSIS — R69 Illness, unspecified: Secondary | ICD-10-CM | POA: Diagnosis not present

## 2018-09-02 DIAGNOSIS — D473 Essential (hemorrhagic) thrombocythemia: Secondary | ICD-10-CM | POA: Diagnosis not present

## 2018-09-02 DIAGNOSIS — K219 Gastro-esophageal reflux disease without esophagitis: Secondary | ICD-10-CM | POA: Diagnosis not present

## 2018-09-11 DIAGNOSIS — E785 Hyperlipidemia, unspecified: Secondary | ICD-10-CM | POA: Diagnosis not present

## 2018-09-11 DIAGNOSIS — F329 Major depressive disorder, single episode, unspecified: Secondary | ICD-10-CM | POA: Diagnosis not present

## 2018-09-11 DIAGNOSIS — G4700 Insomnia, unspecified: Secondary | ICD-10-CM | POA: Diagnosis not present

## 2018-09-11 DIAGNOSIS — R69 Illness, unspecified: Secondary | ICD-10-CM | POA: Diagnosis not present

## 2018-09-11 DIAGNOSIS — H269 Unspecified cataract: Secondary | ICD-10-CM | POA: Diagnosis not present

## 2018-09-11 DIAGNOSIS — I7300 Raynaud's syndrome without gangrene: Secondary | ICD-10-CM | POA: Diagnosis not present

## 2018-09-11 DIAGNOSIS — D473 Essential (hemorrhagic) thrombocythemia: Secondary | ICD-10-CM | POA: Diagnosis not present

## 2018-09-11 DIAGNOSIS — E669 Obesity, unspecified: Secondary | ICD-10-CM | POA: Diagnosis not present

## 2018-09-11 DIAGNOSIS — F4323 Adjustment disorder with mixed anxiety and depressed mood: Secondary | ICD-10-CM | POA: Diagnosis not present

## 2018-09-11 DIAGNOSIS — D869 Sarcoidosis, unspecified: Secondary | ICD-10-CM | POA: Diagnosis not present

## 2018-09-29 ENCOUNTER — Telehealth: Payer: Self-pay | Admitting: *Deleted

## 2018-09-29 NOTE — Telephone Encounter (Signed)
Message sent to Dr. Trula Slade to follow up with patient/questions regarding carotid stenosis and plans for treatment now that patient has completed all testing.

## 2018-09-29 NOTE — Telephone Encounter (Signed)
No answer no voice mail. Attempted to return call to patient.

## 2018-10-01 NOTE — Progress Notes (Signed)
HEMATOLOGY/ONCOLOGY CLINIC NOTE  Date of Service: 10/02/18    Patient Care Team: Lawerance Cruel, MD as PCP - General (Family Medicine)  CHIEF COMPLAINTS/PURPOSE OF CONSULTATION:  Newly diagnosed Jak2 MPN  HISTORY OF PRESENTING ILLNESS:   Alexis Murphy is a wonderful 73 y.o. 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 The pt reports that she is doing well overall. She has seen dermatology and rheumatology with work up of her sarcoidosis. She is seeing Dr Christinia Gully in one month in Pulmonology.   She notes that her skin changes first appeared 10 years ago.   The pt reports that her recent nose bx with [facility] a week ago revealed sarcoidosis and her right foot bx revealed stasis dermatitis. She has been diagnosed with sarcoidosis before this. She began a 20mg  prednisone 31-month-taper, three weeks ago with Dr. [name], and is also taking Plaquenil for her sarcoidosis. She had a CT on 09/19/17 to properly work up her sarcoidosis diagnosis.   She notes that prior to taking steroids her feet were very painful and also had some neuropathy. She also had night sweats and leg and ankle swelling but did not have fatigue or shortness of breath. These positive symptoms have resolved after beginning Prednisone.   She denies any breast symptoms. She has a MMG on 08/05/2017 which showed : IMPRESSION: New suspicious left breast mass 7:00 position retroareolar measuring 0.6 cm on ultrasound. Malignancy cannot be excluded. The mass does have similar imaging appearance to the previously biopsied left breast mass (January 2017, which demonstrated granulomatous inflammation and no evidence of malignancy at pathology.) The possibility of a second area of granulomatous inflammation is considered.  Borderline diffuse cortical thickening of a left axillary lymph node. On review of the patient's prior CT a of the  chest, patient does have mild diffuse lymphadenopathy in the thorax. At this time, axillary lymph node biopsy is not felt warranted.  The patient was given a copy of her pathology report from her left breast biopsy from January 2017. I told her she may want to share this information with her rheumatologist.  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   Of note prior to the patient's visit today, pt has had CT Chest completed on 09/19/17 with results revealing 1. Moderate mediastinal and right supraclavicular lymphadenopathy, increased. New mild bilateral axillary and bilateral hilar lymphadenopathy. Findings are worrisome for a malignant process such as a lymphoproliferative disorder or metastatic nodal disease. The right supraclavicular node may be (but not definitely) accessible for percutaneous biopsy. 2. Numerous similar-appearing subsolid spiculated pulmonary nodules scattered throughout both lungs, upper lobe predominant, largest 1.6 cm, stable to mildly increased in size and slightly increased in density in the interval. These are indeterminate, with multifocal lung adenocarcinoma on the differential. 3. Several new subcentimeter hypodense splenic lesions. Solitary small hypodense posterior right liver lobe lesion. These lesions are indeterminate, with lymphoproliferative disorder or metastatic disease not excluded. Options include further characterization with MRI abdomen without and with IV contrast or short-term follow-up CT abdomen with IV contrast in 3 months. 4. Chronic findings include: Left main and 3 vessel coronary atherosclerosis. Small hiatal hernia. Cholelithiasis.   Most recent lab results (03/10/17) of CBC  is as follows: all values are WNL except for WBC at 17.5k, MCV at 75.8, MCH at 25.2, RDW at 17.6, Platelets at 804k.  On review of systems, pt reports dry  mouth, decreased leg swelling, and denies tingling, numbness in her hands or feet, dry eyes,  problems breathing, cough, SOB, CP, nose bleeds, fatigue, abdominal pains, unexpected weight loss, fevers, chills, night sweats.   On PMHx the pt reports sarcoidosis, Bell's palsy twice.  On Social Hx the pt reports that she smoked cigarettes for 45 years. She quit smoking cigarettes 10 years ago but continues to smoke marijuana occasionally.  On Family Hx the pt reports maternal lupus.  Interval History:   Alexis Murphy returns today regarding her concern for lymphoma. The patient's last visit with [place] was on 08/21/18. The pt reports that she is doing well overall.   The pt reports that she has not developed any concerns in the interim. She denies any problems taking 500mg  Hydroxyurea daily, and denies any abdominal pains or discomfort.  The pt notes that she is doing "fantastic," and endorses good energy levels.  Lab results today (10/02/18) of CBC w/diff and CMP is as follows: all values are WNL except for HGB at 11.6, RDW at 23.0, PLT at 627k, Creatinine at 1.16, AST at 58, ALT at 77, GFR at 47. 10/02/18 LDH is pending  On review of systems, pt reports good energy levels, eating well, and denies abdominal pains, abdominal discomfort, concerns for infections, leg swelling, and any other symptoms.  MEDICAL HISTORY:  Past Medical History:  Diagnosis Date  . Arthritis   . Hyperlipidemia   . Sarcoidosis     SURGICAL HISTORY: Past Surgical History:  Procedure Laterality Date  . ABDOMINAL HYSTERECTOMY    . BREAST BIOPSY    . BREAST SURGERY    . REDUCTION MAMMAPLASTY      SOCIAL HISTORY: Social History   Socioeconomic History  . Marital status: Widowed    Spouse name: Not on file  . Number of children: Not on file  . Years of education: Not on file  . Highest education level: Not on file  Occupational History  . Not on file  Social Needs  . Financial resource strain: Not on file  . Food insecurity:    Worry: Not on file    Inability: Not on file  . Transportation needs:     Medical: Not on file    Non-medical: Not on file  Tobacco Use  . Smoking status: Former Smoker    Packs/day: 1.00    Years: 45.00    Pack years: 45.00    Types: Cigarettes    Last attempt to quit: 10/15/2007    Years since quitting: 10.9  . Smokeless tobacco: Never Used  Substance and Sexual Activity  . Alcohol use: Yes  . Drug use: No  . Sexual activity: Not on file  Lifestyle  . Physical activity:    Days per week: Not on file    Minutes per session: Not on file  . Stress: Not on file  Relationships  . Social connections:    Talks on phone: Not on file    Gets together: Not on file    Attends religious service: Not on file    Active member of club or organization: Not on file    Attends meetings of clubs or organizations: Not on file    Relationship status: Not on file  . Intimate partner violence:    Fear of current or ex partner: Not on file    Emotionally abused: Not on file    Physically abused: Not on file    Forced sexual activity: Not on file  Other Topics Concern  . Not on file  Social History Narrative  . Not on file    FAMILY HISTORY: Family History  Problem Relation Age of Onset  . Heart disease Father   . Heart disease Brother     ALLERGIES:  is allergic to penicillins and codeine.  MEDICATIONS:  Current Outpatient Medications  Medication Sig Dispense Refill  . acetaminophen (TYLENOL) 325 MG tablet Take 1 tablet (325 mg total) by mouth every 6 (six) hours. 30 tablet 0  . amLODipine (NORVASC) 5 MG tablet Take 5 mg by mouth every evening.     Marland Kitchen atorvastatin (LIPITOR) 40 MG tablet Take 40 mg by mouth every evening.     Marland Kitchen FOLBIC 2.5-25-2 MG TABS tablet Take 1 tablet by mouth daily.  3  . hydroxyurea (HYDREA) 500 MG capsule Take 2 tabs (1000mg ) on Monday, Wednesday and Friday and 1 tab (500mg ) the rest of the days of the week. May take with food to minimize GI side effects. 90 capsule 2  . Melatonin 5 MG TABS Take 5 mg by mouth at bedtime.    .  methotrexate (RHEUMATREX) 2.5 MG tablet 4 TABLETS ONCE WEEKLY ORALLY 30 DAYS  2  . omeprazole (PRILOSEC) 40 MG capsule Take 40 mg by mouth every evening.     . predniSONE (DELTASONE) 20 MG tablet Take 7.5 mg by mouth daily. Taking a weaning dose over 4 months.  0  . venlafaxine XR (EFFEXOR-XR) 150 MG 24 hr capsule Take 150 mg by mouth every evening.     . vitamin B-12 (CYANOCOBALAMIN) 1000 MCG tablet Take 1,000 mcg by mouth daily.     No current facility-administered medications for this visit.     REVIEW OF SYSTEMS:    A 10+ POINT REVIEW OF SYSTEMS WAS OBTAINED including neurology, dermatology, psychiatry, cardiac, respiratory, lymph, extremities, GI, GU, Musculoskeletal, constitutional, breasts, reproductive, HEENT.  All pertinent positives are noted in the HPI.  All others are negative.   PHYSICAL EXAMINATION: ECOG PERFORMANCE STATUS: 2 - Symptomatic, <50% confined to bed  Vitals:   10/02/18 1403  BP: 127/90  Pulse: (!) 103  Resp: 18  Temp: 98.3 F (36.8 C)  SpO2: 97%   Filed Weights   10/02/18 1403  Weight: 166 lb 4.8 oz (75.4 kg)   .Body mass index is 31.42 kg/m.   GENERAL:alert, in no acute distress and comfortable SKIN: no acute rashes, no significant lesions EYES: conjunctiva are pink and non-injected, sclera anicteric OROPHARYNX: MMM, no exudates, no oropharyngeal erythema or ulceration NECK: supple, no JVD LYMPH:  no palpable lymphadenopathy in the cervical, axillary or inguinal regions LUNGS: clear to auscultation b/l with normal respiratory effort HEART: regular rate & rhythm ABDOMEN:  normoactive bowel sounds , non tender, not distended. Just palpable splenomegaly.  Extremity: no pedal edema PSYCH: alert & oriented x 3 with fluent speech NEURO: no focal motor/sensory deficits   LABORATORY DATA:  I have reviewed the data as listed  . CBC Latest Ref Rng & Units 10/02/2018 08/21/2018 07/23/2018  WBC 4.0 - 10.5 K/uL 8.4 11.0(H) 12.4(H)  Hemoglobin 12.0 - 15.0  g/dL 11.6(L) 11.6(L) 11.3(L)  Hematocrit 36.0 - 46.0 % 37.0 37.4 36.8  Platelets 150 - 400 K/uL 627(H) 733(H) 1,022(HH)    . CMP Latest Ref Rng & Units 10/02/2018 08/21/2018 07/23/2018  Glucose 70 - 99 mg/dL 92 91 108(H)  BUN 8 - 23 mg/dL 19 18 23   Creatinine 0.44 - 1.00 mg/dL 1.16(H) 1.47(H) 1.09(H)  Sodium 135 -  145 mmol/L 138 142 141  Potassium 3.5 - 5.1 mmol/L 4.6 3.9 3.3(L)  Chloride 98 - 111 mmol/L 103 106 106  CO2 22 - 32 mmol/L 23 24 23   Calcium 8.9 - 10.3 mg/dL 9.1 9.4 8.9  Total Protein 6.5 - 8.1 g/dL 7.1 7.3 -  Total Bilirubin 0.3 - 1.2 mg/dL 0.4 0.6 -  Alkaline Phos 38 - 126 U/L 88 85 -  AST 15 - 41 U/L 58(H) 31 -  ALT 0 - 44 U/L 77(H) 43 -   Component     Latest Ref Rng & Units 11/28/2017  Iron     41 - 142 ug/dL 37 (L)  TIBC     236 - 444 ug/dL 399  Saturation Ratios     21 - 57 % 9 (L)  UIBC     ug/dL 362  Retic Ct Pct     0.7 - 2.1 % 1.7  RBC.     3.70 - 5.45 MIL/uL 4.89  Retic Count, Absolute     33.7 - 90.7 K/uL 83.1  Ferritin     9 - 269 ng/mL 12  Angiotensin-Converting Enzyme     14 - 82 U/L 70  Sed Rate     0 - 22 mm/hr 12  LDH     125 - 245 U/L 302 (H)          RADIOGRAPHIC STUDIES: I have personally reviewed the radiological images as listed and agreed with the findings in the report. No results found. CT chest 09/19/2017: IMPRESSION: 1. Moderate mediastinal and right supraclavicular lymphadenopathy, increased. New mild bilateral axillary and bilateral hilar lymphadenopathy. Findings are worrisome for a malignant process such as a lymphoproliferative disorder or metastatic nodal disease. The right supraclavicular node may be (but not definitely) accessible for percutaneous biopsy. 2. Numerous similar-appearing subsolid spiculated pulmonary nodules scattered throughout both lungs, upper lobe predominant, largest 1.6 cm, stable to mildly increased in size and slightly increased in density in the interval. These are indeterminate, with  multifocal lung adenocarcinoma on the differential. 3. Several new subcentimeter hypodense splenic lesions. Solitary small hypodense posterior right liver lobe lesion. These lesions are indeterminate, with lymphoproliferative disorder or metastatic disease not excluded. Options include further characterization with MRI abdomen without and with IV contrast or short-term follow-up CT abdomen with IV contrast in 3 months. 4. Chronic findings include: Left main and 3 vessel coronary atherosclerosis. Small hiatal hernia. Cholelithiasis.  Aortic Atherosclerosis (ICD10-I70.0).  Electronically Signed   By: Ilona Sorrel M.D.   On: 09/19/2017 11:54   ASSESSMENT & PLAN:   73 y.o. female with  1. Generalized lymphadenopathy with multiple pulmonary nodules CT Chest 09/19/2017: Moderate mediastinal and right supraclavicular lymphadenopathy, increased. New mild bilateral axillary and bilateral hilar lymphadenopathy. Findings are worrisome for a malignant process such as a lymphoproliferative disorder or metastatic nodal disease. The right supraclavicular node may be (but not definitely) accessible for percutaneous biopsy. 2. Numerous similar-appearing subsolid spiculated pulmonary nodules scattered throughout both lungs, upper lobe predominant, largest 1.6 cm, stable to mildly increased in size and slightly increased in density in the interval. These are indeterminate, with multifocal lung adenocarcinoma on the differential.  12/26/17 CT C/A/P revealed interval response to steroids in LNadenopathy   07/17/18 CT C/A/P revealed Continue improvement in mediastinal adenopathy. Lymph nodes are now not pathologic by size criteria. 2. Continued improvement in nodular and ground-glass densities particularly in the RIGHT upper lobe. 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  2. Breast - lesion -  Bx -- granulomatous inflammation -consistent with sarcoidosis.  3. Skin rash on nose - Bx  consistent with sarcoidosis.  4. Iron deficiency - no significant anemia . Lab Results  Component Value Date   IRON 37 (L) 11/28/2017   TIBC 399 11/28/2017   IRONPCTSAT 9 (L) 11/28/2017   (Iron and TIBC)  Lab Results  Component Value Date   FERRITIN 12 11/28/2017   PLAN - GI w/u - will defer to PCP -?GI involvement by sarcoidosis.  5. 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 HGB stable at 11.6. PLT improved to 627k. -Will monitor liver functions as pt is taking Hydroxyurea and methotrexate, will check labs again in 4 weeks -Goal for PLT between 200k and 400k -Will now increase to 1000mg  on MWF, and 500mg  Hydroxyurea the other days each week. 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 She was previously not at all keen to consider additional bx of her supraclavicular LN -the LNadneopathy (clinically improved on steroids) and pulmonary nodules also likely related to sarcoidosis. -patient symptoms have improved with prednisone and MTX that she is on from the the rheumatologist. -Continue follow up with dermatology, rheumatology and pulmonology. -Recommend PO 150mg  Iron  Polysaccharide -Will see the pt back in 4 weeks   RTC with Dr Irene Limbo with labs in 1 months   All of the patients questions were answered with apparent satisfaction. The patient knows to call the clinic with any problems, questions or concerns.  The total time spent in the appt was 25 minutes and more than 50% was on counseling and direct patient cares.    Sullivan Lone MD MS AAHIVMS [facility] Hematology/Oncology Physician [facility]  (Office):       [phone] (Work cell):  [phone] (Fax):           [phone]  10/02/2018 2:32 PM  I, Baldwin [place], am acting as a scribe for Dr. Sullivan Lone.   .I have reviewed the above documentation for accuracy and completeness, and I agree with the above. Brunetta Genera MD

## 2018-10-02 ENCOUNTER — Inpatient Hospital Stay: Payer: Medicare HMO

## 2018-10-02 ENCOUNTER — Inpatient Hospital Stay: Payer: Medicare HMO | Attending: Hematology | Admitting: Hematology

## 2018-10-02 ENCOUNTER — Other Ambulatory Visit: Payer: Self-pay

## 2018-10-02 ENCOUNTER — Telehealth: Payer: Self-pay | Admitting: Hematology

## 2018-10-02 VITALS — BP 127/90 | HR 103 | Temp 98.3°F | Resp 18 | Ht 61.0 in | Wt 166.3 lb

## 2018-10-02 DIAGNOSIS — D473 Essential (hemorrhagic) thrombocythemia: Secondary | ICD-10-CM

## 2018-10-02 DIAGNOSIS — D509 Iron deficiency anemia, unspecified: Secondary | ICD-10-CM | POA: Diagnosis not present

## 2018-10-02 DIAGNOSIS — Z87891 Personal history of nicotine dependence: Secondary | ICD-10-CM

## 2018-10-02 DIAGNOSIS — I2510 Atherosclerotic heart disease of native coronary artery without angina pectoris: Secondary | ICD-10-CM | POA: Insufficient documentation

## 2018-10-02 DIAGNOSIS — R918 Other nonspecific abnormal finding of lung field: Secondary | ICD-10-CM | POA: Diagnosis not present

## 2018-10-02 LAB — CBC WITH DIFFERENTIAL/PLATELET
Abs Immature Granulocytes: 0.05 10*3/uL (ref 0.00–0.07)
Basophils Absolute: 0.1 10*3/uL (ref 0.0–0.1)
Basophils Relative: 1 %
Eosinophils Absolute: 0.1 10*3/uL (ref 0.0–0.5)
Eosinophils Relative: 2 %
HEMATOCRIT: 37 % (ref 36.0–46.0)
Hemoglobin: 11.6 g/dL — ABNORMAL LOW (ref 12.0–15.0)
Immature Granulocytes: 1 %
Lymphocytes Relative: 12 %
Lymphs Abs: 1 10*3/uL (ref 0.7–4.0)
MCH: 27.2 pg (ref 26.0–34.0)
MCHC: 31.4 g/dL (ref 30.0–36.0)
MCV: 86.7 fL (ref 80.0–100.0)
Monocytes Absolute: 0.3 10*3/uL (ref 0.1–1.0)
Monocytes Relative: 3 %
Neutro Abs: 6.9 10*3/uL (ref 1.7–7.7)
Neutrophils Relative %: 81 %
Platelets: 627 10*3/uL — ABNORMAL HIGH (ref 150–400)
RBC: 4.27 MIL/uL (ref 3.87–5.11)
RDW: 23 % — ABNORMAL HIGH (ref 11.5–15.5)
WBC: 8.4 10*3/uL (ref 4.0–10.5)
nRBC: 0 % (ref 0.0–0.2)

## 2018-10-02 LAB — CMP (CANCER CENTER ONLY)
ALT: 77 U/L — ABNORMAL HIGH (ref 0–44)
AST: 58 U/L — ABNORMAL HIGH (ref 15–41)
Albumin: 4.2 g/dL (ref 3.5–5.0)
Alkaline Phosphatase: 88 U/L (ref 38–126)
Anion gap: 12 (ref 5–15)
BUN: 19 mg/dL (ref 8–23)
CO2: 23 mmol/L (ref 22–32)
Calcium: 9.1 mg/dL (ref 8.9–10.3)
Chloride: 103 mmol/L (ref 98–111)
Creatinine: 1.16 mg/dL — ABNORMAL HIGH (ref 0.44–1.00)
GFR, EST NON AFRICAN AMERICAN: 47 mL/min — AB (ref >60)
GFR, Est AFR Am: 54 mL/min — ABNORMAL LOW (ref >60)
Glucose, Bld: 92 mg/dL (ref 70–99)
Potassium: 4.6 mmol/L (ref 3.5–5.1)
Sodium: 138 mmol/L (ref 135–145)
Total Bilirubin: 0.4 mg/dL (ref 0.3–1.2)
Total Protein: 7.1 g/dL (ref 6.5–8.1)

## 2018-10-02 LAB — LACTATE DEHYDROGENASE: LDH: 295 U/L — ABNORMAL HIGH (ref 98–192)

## 2018-10-02 MED ORDER — HYDROXYUREA 500 MG PO CAPS: ORAL_CAPSULE | ORAL | 2 refills | Status: DC

## 2018-10-02 NOTE — Telephone Encounter (Signed)
Gave avs and calendar ° °

## 2018-10-30 DIAGNOSIS — H348112 Central retinal vein occlusion, right eye, stable: Secondary | ICD-10-CM | POA: Diagnosis not present

## 2018-10-30 DIAGNOSIS — R9389 Abnormal findings on diagnostic imaging of other specified body structures: Secondary | ICD-10-CM | POA: Diagnosis not present

## 2018-10-30 DIAGNOSIS — M25471 Effusion, right ankle: Secondary | ICD-10-CM | POA: Diagnosis not present

## 2018-10-30 DIAGNOSIS — I7300 Raynaud's syndrome without gangrene: Secondary | ICD-10-CM | POA: Diagnosis not present

## 2018-10-30 DIAGNOSIS — D473 Essential (hemorrhagic) thrombocythemia: Secondary | ICD-10-CM | POA: Diagnosis not present

## 2018-10-30 DIAGNOSIS — L03119 Cellulitis of unspecified part of limb: Secondary | ICD-10-CM | POA: Diagnosis not present

## 2018-10-30 DIAGNOSIS — M25472 Effusion, left ankle: Secondary | ICD-10-CM | POA: Diagnosis not present

## 2018-10-30 DIAGNOSIS — L52 Erythema nodosum: Secondary | ICD-10-CM | POA: Diagnosis not present

## 2018-10-30 DIAGNOSIS — D869 Sarcoidosis, unspecified: Secondary | ICD-10-CM | POA: Diagnosis not present

## 2018-10-30 DIAGNOSIS — R590 Localized enlarged lymph nodes: Secondary | ICD-10-CM | POA: Diagnosis not present

## 2018-10-31 NOTE — Progress Notes (Signed)
HEMATOLOGY/ONCOLOGY CLINIC NOTE  Date of Service: 11/03/18    Patient Care Team: Lawerance Cruel, MD as PCP - General (Family Medicine)  CHIEF COMPLAINTS/PURPOSE OF CONSULTATION:  Recently diagnosed Jak2 MPN  HISTORY OF PRESENTING ILLNESS:   Alexis Murphy is a wonderful 73 y.o. 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 The pt reports that she is doing well overall. She has seen dermatology and rheumatology with work up of her sarcoidosis. She is seeing Dr Christinia Gully in one month in Pulmonology.   She notes that her skin changes first appeared 10 years ago.   The pt reports that her recent nose bx with [facility] a week ago revealed sarcoidosis and her right foot bx revealed stasis dermatitis. She has been diagnosed with sarcoidosis before this. She began a 20mg  prednisone 17-month-taper, three weeks ago with Dr. [name], and is also taking Plaquenil for her sarcoidosis. She had a CT on 09/19/17 to properly work up her sarcoidosis diagnosis.   She notes that prior to taking steroids her feet were very painful and also had some neuropathy. She also had night sweats and leg and ankle swelling but did not have fatigue or shortness of breath. These positive symptoms have resolved after beginning Prednisone.   She denies any breast symptoms. She has a MMG on 08/05/2017 which showed : IMPRESSION: New suspicious left breast mass 7:00 position retroareolar measuring 0.6 cm on ultrasound. Malignancy cannot be excluded. The mass does have similar imaging appearance to the previously biopsied left breast mass (January 2017, which demonstrated granulomatous inflammation and no evidence of malignancy at pathology.) The possibility of a second area of granulomatous inflammation is considered.  Borderline diffuse cortical thickening of a left axillary lymph node. On review of the patient's prior CT a of  the chest, patient does have mild diffuse lymphadenopathy in the thorax. At this time, axillary lymph node biopsy is not felt warranted.  The patient was given a copy of her pathology report from her left breast biopsy from January 2017. I told her she may want to share this information with her rheumatologist.  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   Of note prior to the patient's visit today, pt has had CT Chest completed on 09/19/17 with results revealing 1. Moderate mediastinal and right supraclavicular lymphadenopathy, increased. New mild bilateral axillary and bilateral hilar lymphadenopathy. Findings are worrisome for a malignant process such as a lymphoproliferative disorder or metastatic nodal disease. The right supraclavicular node may be (but not definitely) accessible for percutaneous biopsy. 2. Numerous similar-appearing subsolid spiculated pulmonary nodules scattered throughout both lungs, upper lobe predominant, largest 1.6 cm, stable to mildly increased in size and slightly increased in density in the interval. These are indeterminate, with multifocal lung adenocarcinoma on the differential. 3. Several new subcentimeter hypodense splenic lesions. Solitary small hypodense posterior right liver lobe lesion. These lesions are indeterminate, with lymphoproliferative disorder or metastatic disease not excluded. Options include further characterization with MRI abdomen without and with IV contrast or short-term follow-up CT abdomen with IV contrast in 3 months. 4. Chronic findings include: Left main and 3 vessel coronary atherosclerosis. Small hiatal hernia. Cholelithiasis.   Most recent lab results (03/10/17) of CBC  is as follows: all values are WNL except for WBC at 17.5k, MCV at 75.8, MCH at 25.2, RDW at 17.6, Platelets at 804k.  On review of systems, pt reports dry  mouth, decreased leg swelling, and denies tingling, numbness in her hands or feet, dry eyes,  problems breathing, cough, SOB, CP, nose bleeds, fatigue, abdominal pains, unexpected weight loss, fevers, chills, night sweats.   On PMHx the pt reports sarcoidosis, Bell's palsy twice.  On Social Hx the pt reports that she smoked cigarettes for 45 years. She quit smoking cigarettes 10 years ago but continues to smoke marijuana occasionally.  On Family Hx the pt reports maternal lupus.  Interval History:   Alexis Murphy returns today regarding her concern for lymphoma. The patient's last visit with [place] was on 10/02/18. The pt reports that she is doing well overall.  The pt reports that she has not developed any new concerns in the interim. She notes that she has tolerated Hydroxyurea well, and denies mouth sores, nausea, and leg swelling. The pt notes that she is not staying very well hydrated.  Lab results today (11/03/18) of CBC w/diff and CMP is as follows: all values are WNL except for HGB at 11.7, RDW at 25.4, CO2 at 19, Glucose at 105, Creatinine at 1.50, ALT at 98, GFR at 34. 11/03/18 Ferritin is pending  On review of systems, pt reports good energy levels, and denies mouth sores, nausea, leg swelling, calf pain, and any other symptoms.   MEDICAL HISTORY:  Past Medical History:  Diagnosis Date  . Arthritis   . Hyperlipidemia   . Sarcoidosis     SURGICAL HISTORY: Past Surgical History:  Procedure Laterality Date  . ABDOMINAL HYSTERECTOMY    . BREAST BIOPSY    . BREAST SURGERY    . REDUCTION MAMMAPLASTY      SOCIAL HISTORY: Social History   Socioeconomic History  . Marital status: Widowed    Spouse name: Not on file  . Number of children: Not on file  . Years of education: Not on file  . Highest education level: Not on file  Occupational History  . Not on file  Social Needs  . Financial resource strain: Not on file  . Food insecurity:    Worry: Not on file    Inability: Not on file  . Transportation needs:    Medical: Not on file    Non-medical: Not on file   Tobacco Use  . Smoking status: Former Smoker    Packs/day: 1.00    Years: 45.00    Pack years: 45.00    Types: Cigarettes    Last attempt to quit: 10/15/2007    Years since quitting: 11.0  . Smokeless tobacco: Never Used  Substance and Sexual Activity  . Alcohol use: Yes  . Drug use: No  . Sexual activity: Not on file  Lifestyle  . Physical activity:    Days per week: Not on file    Minutes per session: Not on file  . Stress: Not on file  Relationships  . Social connections:    Talks on phone: Not on file    Gets together: Not on file    Attends religious service: Not on file    Active member of club or organization: Not on file    Attends meetings of clubs or organizations: Not on file    Relationship status: Not on file  . Intimate partner violence:    Fear of current or ex partner: Not on file    Emotionally abused: Not on file    Physically abused: Not on file    Forced sexual activity: Not on file  Other Topics Concern  .  Not on file  Social History Narrative  . Not on file    FAMILY HISTORY: Family History  Problem Relation Age of Onset  . Heart disease Father   . Heart disease Brother     ALLERGIES:  is allergic to penicillins and codeine.  MEDICATIONS:  Current Outpatient Medications  Medication Sig Dispense Refill  . acetaminophen (TYLENOL) 325 MG tablet Take 1 tablet (325 mg total) by mouth every 6 (six) hours. 30 tablet 0  . amLODipine (NORVASC) 5 MG tablet Take 5 mg by mouth every evening.     Marland Kitchen atorvastatin (LIPITOR) 40 MG tablet Take 40 mg by mouth every evening.     Marland Kitchen FOLBIC 2.5-25-2 MG TABS tablet Take 1 tablet by mouth daily.  3  . hydroxyurea (HYDREA) 500 MG capsule Take 2 tabs (1000mg ) on Monday, Wednesday and Friday and 1 tab (500mg ) the rest of the days of the week. May take with food to minimize GI side effects. 90 capsule 2  . Melatonin 5 MG TABS Take 5 mg by mouth at bedtime.    . methotrexate (RHEUMATREX) 2.5 MG tablet 4 TABLETS ONCE  WEEKLY ORALLY 30 DAYS  2  . omeprazole (PRILOSEC) 40 MG capsule Take 40 mg by mouth every evening.     . predniSONE (DELTASONE) 20 MG tablet Take 7.5 mg by mouth daily. Taking a weaning dose over 4 months.  0  . venlafaxine XR (EFFEXOR-XR) 150 MG 24 hr capsule Take 150 mg by mouth every evening.     . vitamin B-12 (CYANOCOBALAMIN) 1000 MCG tablet Take 1,000 mcg by mouth daily.     No current facility-administered medications for this visit.     REVIEW OF SYSTEMS:    A 10+ POINT REVIEW OF SYSTEMS WAS OBTAINED including neurology, dermatology, psychiatry, cardiac, respiratory, lymph, extremities, GI, GU, Musculoskeletal, constitutional, breasts, reproductive, HEENT.  All pertinent positives are noted in the HPI.  All others are negative.   PHYSICAL EXAMINATION: ECOG PERFORMANCE STATUS: 2 - Symptomatic, <50% confined to bed  Vitals:   11/03/18 1440  BP: (!) 150/92  Pulse: (!) 111  Resp: 18  Temp: 98.2 F (36.8 C)  SpO2: 98%   Filed Weights   11/03/18 1440  Weight: 167 lb 8 oz (76 kg)   .Body mass index is 31.65 kg/m.   GENERAL:alert, in no acute distress and comfortable SKIN: no acute rashes, no significant lesions EYES: conjunctiva are pink and non-injected, sclera anicteric OROPHARYNX: MMM, no exudates, no oropharyngeal erythema or ulceration NECK: supple, no JVD LYMPH:  no palpable lymphadenopathy in the cervical, axillary or inguinal regions LUNGS: clear to auscultation b/l with normal respiratory effort HEART: regular rate & rhythm ABDOMEN:  normoactive bowel sounds , non tender, not distended. No palpable hepatosplenomegaly.  Extremity: no pedal edema PSYCH: alert & oriented x 3 with fluent speech NEURO: no focal motor/sensory deficits   LABORATORY DATA:  I have reviewed the data as listed  . CBC Latest Ref Rng & Units 11/03/2018 10/02/2018 08/21/2018  WBC 4.0 - 10.5 K/uL 6.8 8.4 11.0(H)  Hemoglobin 12.0 - 15.0 g/dL 11.7(L) 11.6(L) 11.6(L)  Hematocrit 36.0 - 46.0  % 36.1 37.0 37.4  Platelets 150 - 400 K/uL 330 627(H) 733(H)    . CMP Latest Ref Rng & Units 11/03/2018 10/02/2018 08/21/2018  Glucose 70 - 99 mg/dL 105(H) 92 91  BUN 8 - 23 mg/dL 19 19 18   Creatinine 0.44 - 1.00 mg/dL 1.50(H) 1.16(H) 1.47(H)  Sodium 135 - 145 mmol/L 139 138  142  Potassium 3.5 - 5.1 mmol/L 4.1 4.6 3.9  Chloride 98 - 111 mmol/L 105 103 106  CO2 22 - 32 mmol/L 19(L) 23 24  Calcium 8.9 - 10.3 mg/dL 9.2 9.1 9.4  Total Protein 6.5 - 8.1 g/dL 7.2 7.1 7.3  Total Bilirubin 0.3 - 1.2 mg/dL 0.7 0.4 0.6  Alkaline Phos 38 - 126 U/L 98 88 85  AST 15 - 41 U/L 38 58(H) 31  ALT 0 - 44 U/L 98(H) 77(H) 43           RADIOGRAPHIC STUDIES: I have personally reviewed the radiological images as listed and agreed with the findings in the report. No results found. CT chest 09/19/2017: IMPRESSION: 1. Moderate mediastinal and right supraclavicular lymphadenopathy, increased. New mild bilateral axillary and bilateral hilar lymphadenopathy. Findings are worrisome for a malignant process such as a lymphoproliferative disorder or metastatic nodal disease. The right supraclavicular node may be (but not definitely) accessible for percutaneous biopsy. 2. Numerous similar-appearing subsolid spiculated pulmonary nodules scattered throughout both lungs, upper lobe predominant, largest 1.6 cm, stable to mildly increased in size and slightly increased in density in the interval. These are indeterminate, with multifocal lung adenocarcinoma on the differential. 3. Several new subcentimeter hypodense splenic lesions. Solitary small hypodense posterior right liver lobe lesion. These lesions are indeterminate, with lymphoproliferative disorder or metastatic disease not excluded. Options include further characterization with MRI abdomen without and with IV contrast or short-term follow-up CT abdomen with IV contrast in 3 months. 4. Chronic findings include: Left main and 3 vessel coronary  atherosclerosis. Small hiatal hernia. Cholelithiasis.  Aortic Atherosclerosis (ICD10-I70.0).  Electronically Signed   By: Ilona Sorrel M.D.   On: 09/19/2017 11:54   ASSESSMENT & PLAN:   73 y.o. female with  1. Generalized lymphadenopathy with multiple pulmonary nodules CT Chest 09/19/2017: Moderate mediastinal and right supraclavicular lymphadenopathy, increased. New mild bilateral axillary and bilateral hilar lymphadenopathy. Findings are worrisome for a malignant process such as a lymphoproliferative disorder or metastatic nodal disease. The right supraclavicular node may be (but not definitely) accessible for percutaneous biopsy. 2. Numerous similar-appearing subsolid spiculated pulmonary nodules scattered throughout both lungs, upper lobe predominant, largest 1.6 cm, stable to mildly increased in size and slightly increased in density in the interval. These are indeterminate, with multifocal lung adenocarcinoma on the differential.  12/26/17 CT C/A/P revealed interval response to steroids in LNadenopathy   07/17/18 CT C/A/P revealed Continue improvement in mediastinal adenopathy. Lymph nodes are now not pathologic by size criteria. 2. Continued improvement in nodular and ground-glass densities particularly in the RIGHT upper lobe. 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  2. Breast - lesion - Bx -- granulomatous inflammation -consistent with sarcoidosis.  3. Skin rash on nose - Bx consistent with sarcoidosis.  4. Iron deficiency - no significant anemia . Lab Results  Component Value Date   IRON 123 11/03/2018   TIBC 354 11/03/2018   IRONPCTSAT 35 11/03/2018   (Iron and TIBC)  Lab Results  Component Value Date   FERRITIN 12 11/28/2017   PLAN - GI w/u - will defer to PCP -?GI involvement by sarcoidosis.  5. 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 AST normalized to 38, ALT increased to 98. Creatinine at 1.50. Will continue to monitor liver functions as pt is on Methotrexate and Hydroxyurea. -11/03/18 Ferritin is pending -The pt has no prohibitive toxicities from continuing 1000mg  Hydroxyurea on MWF, and 500mg  T,R,Sat,Sun at this time. 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 She was previously not at all keen to consider additional bx of her supraclavicular LN -the LNadneopathy (clinically improved on steroids) and pulmonary nodules also likely related to sarcoidosis. -patient symptoms have improved with prednisone and MTX that she is on from the the rheumatologist. -Continue follow up with dermatology, rheumatology and pulmonology. -Recommend PCP to consider decreasing or holding statins in the setting of elevated liver functions -Recommend PO 150mg  Iron Polysaccharide - will hold if polycythemia results. -Will see the pt back in 6 weeks   RTC with Dr Irene Limbo with labs in 6 weeks    All of the patients questions were answered with apparent satisfaction. The patient knows to call the clinic with any problems, questions or concerns.  The total time spent in the appt was 25 minutes and more than 50% was on counseling and direct patient cares.    Sullivan Lone MD MS AAHIVMS [facility] Hematology/Oncology Physician [facility]  (Office):       [phone] (Work cell):  [phone] (Fax):           [phone]  11/03/2018 3:09 PM  I, Baldwin Jamaica, am acting as a scribe for Dr. Sullivan Lone.   .I have reviewed the above documentation for accuracy and completeness, and I agree with the above. Brunetta Genera MD

## 2018-11-03 ENCOUNTER — Inpatient Hospital Stay: Payer: Medicare HMO

## 2018-11-03 ENCOUNTER — Telehealth: Payer: Self-pay | Admitting: Hematology

## 2018-11-03 ENCOUNTER — Inpatient Hospital Stay: Payer: Medicare HMO | Attending: Hematology | Admitting: Hematology

## 2018-11-03 ENCOUNTER — Other Ambulatory Visit: Payer: Self-pay

## 2018-11-03 VITALS — BP 150/92 | HR 111 | Temp 98.2°F | Resp 18 | Ht 61.0 in | Wt 167.5 lb

## 2018-11-03 DIAGNOSIS — D473 Essential (hemorrhagic) thrombocythemia: Secondary | ICD-10-CM

## 2018-11-03 DIAGNOSIS — Z79899 Other long term (current) drug therapy: Secondary | ICD-10-CM | POA: Insufficient documentation

## 2018-11-03 DIAGNOSIS — R591 Generalized enlarged lymph nodes: Secondary | ICD-10-CM | POA: Diagnosis not present

## 2018-11-03 DIAGNOSIS — R918 Other nonspecific abnormal finding of lung field: Secondary | ICD-10-CM | POA: Diagnosis not present

## 2018-11-03 DIAGNOSIS — N63 Unspecified lump in unspecified breast: Secondary | ICD-10-CM

## 2018-11-03 DIAGNOSIS — Z87891 Personal history of nicotine dependence: Secondary | ICD-10-CM | POA: Diagnosis not present

## 2018-11-03 LAB — CBC WITH DIFFERENTIAL/PLATELET
Abs Immature Granulocytes: 0.04 10*3/uL (ref 0.00–0.07)
Basophils Absolute: 0 10*3/uL (ref 0.0–0.1)
Basophils Relative: 1 %
Eosinophils Absolute: 0.1 10*3/uL (ref 0.0–0.5)
Eosinophils Relative: 1 %
HCT: 36.1 % (ref 36.0–46.0)
Hemoglobin: 11.7 g/dL — ABNORMAL LOW (ref 12.0–15.0)
Immature Granulocytes: 1 %
Lymphocytes Relative: 16 %
Lymphs Abs: 1.1 10*3/uL (ref 0.7–4.0)
MCH: 29.4 pg (ref 26.0–34.0)
MCHC: 32.4 g/dL (ref 30.0–36.0)
MCV: 90.7 fL (ref 80.0–100.0)
Monocytes Absolute: 0.6 10*3/uL (ref 0.1–1.0)
Monocytes Relative: 8 %
Neutro Abs: 5.1 10*3/uL (ref 1.7–7.7)
Neutrophils Relative %: 73 %
Platelets: 330 10*3/uL (ref 150–400)
RBC: 3.98 MIL/uL (ref 3.87–5.11)
RDW: 25.4 % — ABNORMAL HIGH (ref 11.5–15.5)
WBC: 6.8 10*3/uL (ref 4.0–10.5)
nRBC: 0 % (ref 0.0–0.2)

## 2018-11-03 LAB — CMP (CANCER CENTER ONLY)
ALT: 98 U/L — ABNORMAL HIGH (ref 0–44)
AST: 38 U/L (ref 15–41)
Albumin: 4.5 g/dL (ref 3.5–5.0)
Alkaline Phosphatase: 98 U/L (ref 38–126)
Anion gap: 15 (ref 5–15)
BUN: 19 mg/dL (ref 8–23)
CO2: 19 mmol/L — ABNORMAL LOW (ref 22–32)
Calcium: 9.2 mg/dL (ref 8.9–10.3)
Chloride: 105 mmol/L (ref 98–111)
Creatinine: 1.5 mg/dL — ABNORMAL HIGH (ref 0.44–1.00)
GFR, Est AFR Am: 40 mL/min — ABNORMAL LOW (ref >60)
GFR, Estimated: 34 mL/min — ABNORMAL LOW (ref >60)
Glucose, Bld: 105 mg/dL — ABNORMAL HIGH (ref 70–99)
Potassium: 4.1 mmol/L (ref 3.5–5.1)
Sodium: 139 mmol/L (ref 135–145)
Total Bilirubin: 0.7 mg/dL (ref 0.3–1.2)
Total Protein: 7.2 g/dL (ref 6.5–8.1)

## 2018-11-03 LAB — FERRITIN: Ferritin: 47 ng/mL (ref 11–307)

## 2018-11-03 LAB — IRON AND TIBC
Iron: 123 ug/dL (ref 41–142)
Saturation Ratios: 35 % (ref 21–57)
TIBC: 354 ug/dL (ref 236–444)
UIBC: 231 ug/dL (ref 120–384)

## 2018-11-03 NOTE — Telephone Encounter (Signed)
Scheduled appt per 4/27 los.  A calendar will be mailed out.

## 2018-11-04 ENCOUNTER — Telehealth: Payer: Self-pay | Admitting: *Deleted

## 2018-11-04 NOTE — Telephone Encounter (Signed)
Patient requested results of bone marrow biopsy 08/04/2018 and lab results 11/03/2018 be faxed to [name], [place] w/[facility] @ [phone]. Patient signed ROI. Results faxed. Fax confirmation received.

## 2018-12-12 NOTE — Progress Notes (Signed)
HEMATOLOGY/ONCOLOGY CLINIC NOTE  Date of Service: 12/15/18    Patient Care Team: Lawerance Cruel, MD as PCP - General (Family Medicine)  CHIEF COMPLAINTS/PURPOSE OF CONSULTATION:  Recently diagnosed Jak2 MPN  HISTORY OF PRESENTING ILLNESS:   Alexis Murphy is a wonderful 73 y.o. 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 The pt reports that she is doing well overall. She has seen dermatology and rheumatology with work up of her sarcoidosis. She is seeing Dr Christinia Gully in one month in Pulmonology.   She notes that her skin changes first appeared 10 years ago.   The pt reports that her recent nose bx with [facility] a week ago revealed sarcoidosis and her right foot bx revealed stasis dermatitis. She has been diagnosed with sarcoidosis before this. She began a 20mg  prednisone 38-month-taper, three weeks ago with Dr. [name], and is also taking Plaquenil for her sarcoidosis. She had a CT on 09/19/17 to properly work up her sarcoidosis diagnosis.   She notes that prior to taking steroids her feet were very painful and also had some neuropathy. She also had night sweats and leg and ankle swelling but did not have fatigue or shortness of breath. These positive symptoms have resolved after beginning Prednisone.   She denies any breast symptoms. She has a MMG on 08/05/2017 which showed : IMPRESSION: New suspicious left breast mass 7:00 position retroareolar measuring 0.6 cm on ultrasound. Malignancy cannot be excluded. The mass does have similar imaging appearance to the previously biopsied left breast mass (January 2017, which demonstrated granulomatous inflammation and no evidence of malignancy at pathology.) The possibility of a second area of granulomatous inflammation is considered.  Borderline diffuse cortical thickening of a left axillary lymph node. On review of the patient's prior CT a of  the chest, patient does have mild diffuse lymphadenopathy in the thorax. At this time, axillary lymph node biopsy is not felt warranted.  The patient was given a copy of her pathology report from her left breast biopsy from January 2017. I told her she may want to share this information with her rheumatologist.  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   Of note prior to the patient's visit today, pt has had CT Chest completed on 09/19/17 with results revealing 1. Moderate mediastinal and right supraclavicular lymphadenopathy, increased. New mild bilateral axillary and bilateral hilar lymphadenopathy. Findings are worrisome for a malignant process such as a lymphoproliferative disorder or metastatic nodal disease. The right supraclavicular node may be (but not definitely) accessible for percutaneous biopsy. 2. Numerous similar-appearing subsolid spiculated pulmonary nodules scattered throughout both lungs, upper lobe predominant, largest 1.6 cm, stable to mildly increased in size and slightly increased in density in the interval. These are indeterminate, with multifocal lung adenocarcinoma on the differential. 3. Several new subcentimeter hypodense splenic lesions. Solitary small hypodense posterior right liver lobe lesion. These lesions are indeterminate, with lymphoproliferative disorder or metastatic disease not excluded. Options include further characterization with MRI abdomen without and with IV contrast or short-term follow-up CT abdomen with IV contrast in 3 months. 4. Chronic findings include: Left main and 3 vessel coronary atherosclerosis. Small hiatal hernia. Cholelithiasis.   Most recent lab results (03/10/17) of CBC  is as follows: all values are WNL except for WBC at 17.5k, MCV at 75.8, MCH at 25.2, RDW at 17.6, Platelets at 804k.  On review of systems, pt reports dry  mouth, decreased leg swelling, and denies tingling, numbness in her hands or feet, dry eyes,  problems breathing, cough, SOB, CP, nose bleeds, fatigue, abdominal pains, unexpected weight loss, fevers, chills, night sweats.   On PMHx the pt reports sarcoidosis, Bell's palsy twice.  On Social Hx the pt reports that she smoked cigarettes for 45 years. She quit smoking cigarettes 10 years ago but continues to smoke marijuana occasionally.  On Family Hx the pt reports maternal lupus.  Interval History:   Alexis Murphy returns today regarding her concern for lymphoma. The patient's last visit with [place] was on 11/03/18. The pt reports that she is doing well overall.  The pt reports that she has been enjoying good energy levels in the interim. She continues on 6 pills of Methotrexate per day as well as Hydroxyurea and denies any problems taking these medications. She endorses eating well and good appetite.  Lab results today (12/15/18) of CBC w/diff and CMP is as follows: all values are WNL except for RBC at 2.98, HGB at 10.2, HCT at 31.2, MCV at 104.7, MCH at 34.2, RDW at 23.9, Creatinine at 1.31, Calcium at 8.7, GFR at 41.  On review of systems, pt reports good energy levels, eating well, good appetite, and denies concerns for infections, and any other symptoms.   MEDICAL HISTORY:  Past Medical History:  Diagnosis Date  . Arthritis   . Hyperlipidemia   . Sarcoidosis     SURGICAL HISTORY: Past Surgical History:  Procedure Laterality Date  . ABDOMINAL HYSTERECTOMY    . BREAST BIOPSY    . BREAST SURGERY    . REDUCTION MAMMAPLASTY      SOCIAL HISTORY: Social History   Socioeconomic History  . Marital status: Widowed    Spouse name: Not on file  . Number of children: Not on file  . Years of education: Not on file  . Highest education level: Not on file  Occupational History  . Not on file  Social Needs  . Financial resource strain: Not on file  . Food insecurity:    Worry: Not on file    Inability: Not on file  . Transportation needs:    Medical: Not on file     Non-medical: Not on file  Tobacco Use  . Smoking status: Former Smoker    Packs/day: 1.00    Years: 45.00    Pack years: 45.00    Types: Cigarettes    Last attempt to quit: 10/15/2007    Years since quitting: 11.1  . Smokeless tobacco: Never Used  Substance and Sexual Activity  . Alcohol use: Yes  . Drug use: No  . Sexual activity: Not on file  Lifestyle  . Physical activity:    Days per week: Not on file    Minutes per session: Not on file  . Stress: Not on file  Relationships  . Social connections:    Talks on phone: Not on file    Gets together: Not on file    Attends religious service: Not on file    Active member of club or organization: Not on file    Attends meetings of clubs or organizations: Not on file    Relationship status: Not on file  . Intimate partner violence:    Fear of current or ex partner: Not on file    Emotionally abused: Not on file    Physically abused: Not on file    Forced sexual activity: Not on file  Other Topics Concern  .  Not on file  Social History Narrative  . Not on file    FAMILY HISTORY: Family History  Problem Relation Age of Onset  . Heart disease Father   . Heart disease Brother     ALLERGIES:  is allergic to penicillins and codeine.  MEDICATIONS:  Current Outpatient Medications  Medication Sig Dispense Refill  . acetaminophen (TYLENOL) 325 MG tablet Take 1 tablet (325 mg total) by mouth every 6 (six) hours. 30 tablet 0  . amLODipine (NORVASC) 5 MG tablet Take 5 mg by mouth every evening.     Marland Kitchen atorvastatin (LIPITOR) 40 MG tablet Take 40 mg by mouth every evening.     Marland Kitchen FOLBIC 2.5-25-2 MG TABS tablet Take 1 tablet by mouth daily.  3  . hydroxyurea (HYDREA) 500 MG capsule Take 2 tabs (1000mg ) on Monday and 1 tab (500mg ) the rest of the days of the week. May take with food to minimize GI side effects. 90 capsule 2  . Melatonin 5 MG TABS Take 5 mg by mouth at bedtime.    . methotrexate (RHEUMATREX) 2.5 MG tablet 4 TABLETS ONCE  WEEKLY ORALLY 30 DAYS  2  . omeprazole (PRILOSEC) 40 MG capsule Take 40 mg by mouth every evening.     . predniSONE (DELTASONE) 20 MG tablet Take 7.5 mg by mouth daily. Taking a weaning dose over 4 months.  0  . venlafaxine XR (EFFEXOR-XR) 150 MG 24 hr capsule Take 150 mg by mouth every evening.     . vitamin B-12 (CYANOCOBALAMIN) 1000 MCG tablet Take 1,000 mcg by mouth daily.     No current facility-administered medications for this visit.     REVIEW OF SYSTEMS:    A 10+ POINT REVIEW OF SYSTEMS WAS OBTAINED including neurology, dermatology, psychiatry, cardiac, respiratory, lymph, extremities, GI, GU, Musculoskeletal, constitutional, breasts, reproductive, HEENT.  All pertinent positives are noted in the HPI.  All others are negative.   PHYSICAL EXAMINATION: ECOG PERFORMANCE STATUS: 2 - Symptomatic, <50% confined to bed  Vitals:   12/15/18 1423  BP: 140/76  Pulse: 96  Resp: 18  Temp: 98 F (36.7 C)  SpO2: 97%   Filed Weights   12/15/18 1423  Weight: 169 lb 9.6 oz (76.9 kg)   .Body mass index is 32.05 kg/m.  GENERAL:alert, in no acute distress and comfortable SKIN: no acute rashes, no significant lesions EYES: conjunctiva are pink and non-injected, sclera anicteric OROPHARYNX: MMM, no exudates, no oropharyngeal erythema or ulceration NECK: supple, no JVD LYMPH:  no palpable lymphadenopathy in the cervical, axillary or inguinal regions LUNGS: clear to auscultation b/l with normal respiratory effort HEART: regular rate & rhythm ABDOMEN:  normoactive bowel sounds , non tender, not distended. No palpable hepatosplenomegaly.  Extremity: no pedal edema PSYCH: alert & oriented x 3 with fluent speech NEURO: no focal motor/sensory deficits   LABORATORY DATA:  I have reviewed the data as listed  . CBC Latest Ref Rng & Units 12/15/2018 11/03/2018 10/02/2018  WBC 4.0 - 10.5 K/uL 4.5 6.8 8.4  Hemoglobin 12.0 - 15.0 g/dL 10.2(L) 11.7(L) 11.6(L)  Hematocrit 36.0 - 46.0 % 31.2(L) 36.1  37.0  Platelets 150 - 400 K/uL 233 330 627(H)    . CMP Latest Ref Rng & Units 12/15/2018 11/03/2018 10/02/2018  Glucose 70 - 99 mg/dL 96 105(H) 92  BUN 8 - 23 mg/dL 21 19 19   Creatinine 0.44 - 1.00 mg/dL 1.31(H) 1.50(H) 1.16(H)  Sodium 135 - 145 mmol/L 139 139 138  Potassium 3.5 - 5.1  mmol/L 4.2 4.1 4.6  Chloride 98 - 111 mmol/L 104 105 103  CO2 22 - 32 mmol/L 23 19(L) 23  Calcium 8.9 - 10.3 mg/dL 8.7(L) 9.2 9.1  Total Protein 6.5 - 8.1 g/dL 6.7 7.2 7.1  Total Bilirubin 0.3 - 1.2 mg/dL 0.6 0.7 0.4  Alkaline Phos 38 - 126 U/L 93 98 88  AST 15 - 41 U/L 25 38 58(H)  ALT 0 - 44 U/L 37 98(H) 77(H)           RADIOGRAPHIC STUDIES: I have personally reviewed the radiological images as listed and agreed with the findings in the report. No results found. CT chest 09/19/2017: IMPRESSION: 1. Moderate mediastinal and right supraclavicular lymphadenopathy, increased. New mild bilateral axillary and bilateral hilar lymphadenopathy. Findings are worrisome for a malignant process such as a lymphoproliferative disorder or metastatic nodal disease. The right supraclavicular node may be (but not definitely) accessible for percutaneous biopsy. 2. Numerous similar-appearing subsolid spiculated pulmonary nodules scattered throughout both lungs, upper lobe predominant, largest 1.6 cm, stable to mildly increased in size and slightly increased in density in the interval. These are indeterminate, with multifocal lung adenocarcinoma on the differential. 3. Several new subcentimeter hypodense splenic lesions. Solitary small hypodense posterior right liver lobe lesion. These lesions are indeterminate, with lymphoproliferative disorder or metastatic disease not excluded. Options include further characterization with MRI abdomen without and with IV contrast or short-term follow-up CT abdomen with IV contrast in 3 months. 4. Chronic findings include: Left main and 3 vessel coronary atherosclerosis.  Small hiatal hernia. Cholelithiasis.  Aortic Atherosclerosis (ICD10-I70.0).  Electronically Signed   By: Ilona Sorrel M.D.   On: 09/19/2017 11:54   ASSESSMENT & PLAN:   73 y.o. female with  1. Generalized lymphadenopathy with multiple pulmonary nodules CT Chest 09/19/2017: Moderate mediastinal and right supraclavicular lymphadenopathy, increased. New mild bilateral axillary and bilateral hilar lymphadenopathy. Findings are worrisome for a malignant process such as a lymphoproliferative disorder or metastatic nodal disease. The right supraclavicular node may be (but not definitely) accessible for percutaneous biopsy. 2. Numerous similar-appearing subsolid spiculated pulmonary nodules scattered throughout both lungs, upper lobe predominant, largest 1.6 cm, stable to mildly increased in size and slightly increased in density in the interval. These are indeterminate, with multifocal lung adenocarcinoma on the differential.  12/26/17 CT C/A/P revealed interval response to steroids in LNadenopathy   07/17/18 CT C/A/P revealed Continue improvement in mediastinal adenopathy. Lymph nodes are now not pathologic by size criteria. 2. Continued improvement in nodular and ground-glass densities particularly in the RIGHT upper lobe. 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  2. Breast - lesion - Bx -- granulomatous inflammation -consistent with sarcoidosis.  3. Skin rash on nose - Bx consistent with sarcoidosis.  4. Iron deficiency - no significant anemia . Lab Results  Component Value Date   IRON 123 11/03/2018   TIBC 354 11/03/2018   IRONPCTSAT 35 11/03/2018   (Iron and TIBC)  Lab Results  Component Value Date   FERRITIN 47 11/03/2018   PLAN - GI w/u - will defer to PCP -?GI involvement by sarcoidosis.  5. 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 Liver functions have normalized, other chemistries stable -As pt has developed some anemia, suspect that MTX and Hydroxyurea are both playing into this, and as PLT are very well controlled at 233k we will decrease weekly dose of Hydroxyruea now, as noted below. -The pt has no prohibitive toxicities from decreasing to 1000mg  Hydroxyurea one day a week, with 500mg  the other six days at this time. -Goal for PLT between 200k and 400k -Will continue to monitor liver functions as pt is on Methotrexate and Hydroxyurea. -Continue on 81mg  aspirin -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 She was previously not at all keen to consider additional bx of her supraclavicular LN -the LNadneopathy (clinically improved on steroids) and pulmonary nodules also likely related to sarcoidosis. -patient symptoms have improved with prednisone and MTX that she is on from the the rheumatologist. -Continue follow up with dermatology, rheumatology and pulmonology. -Recommend PCP to consider decreasing or holding statins in the setting of elevated liver functions -Recommend PO 150mg  Iron Polysaccharide - will hold if polycythemia results. -Will see the pt back in 8 weeks   RTC with Dr Irene Limbo with labs in 2 months   All of the patients questions were answered with apparent satisfaction. The patient knows to call the clinic with any problems, questions or concerns.  The total time spent in the appt was 20 minutes and more than 50% was on counseling and direct patient cares.    Sullivan Lone MD MS AAHIVMS [facility]  Hematology/Oncology Physician [facility]  (Office):       [phone] (Work cell):  [phone] (Fax):           [phone]  12/15/2018 2:53 PM  I, Baldwin [place], am acting as a scribe for Dr. Sullivan Lone.   .I have reviewed the above documentation for accuracy and completeness, and I agree with the above. Brunetta Genera MD

## 2018-12-15 ENCOUNTER — Telehealth: Payer: Self-pay | Admitting: Hematology

## 2018-12-15 ENCOUNTER — Other Ambulatory Visit: Payer: Self-pay

## 2018-12-15 ENCOUNTER — Inpatient Hospital Stay: Payer: Medicare HMO | Attending: Hematology

## 2018-12-15 ENCOUNTER — Inpatient Hospital Stay (HOSPITAL_BASED_OUTPATIENT_CLINIC_OR_DEPARTMENT_OTHER): Payer: Medicare HMO | Admitting: Hematology

## 2018-12-15 VITALS — BP 140/76 | HR 96 | Temp 98.0°F | Resp 18 | Ht 61.0 in | Wt 169.6 lb

## 2018-12-15 DIAGNOSIS — D869 Sarcoidosis, unspecified: Secondary | ICD-10-CM | POA: Diagnosis not present

## 2018-12-15 DIAGNOSIS — R682 Dry mouth, unspecified: Secondary | ICD-10-CM | POA: Insufficient documentation

## 2018-12-15 DIAGNOSIS — D473 Essential (hemorrhagic) thrombocythemia: Secondary | ICD-10-CM

## 2018-12-15 DIAGNOSIS — M79673 Pain in unspecified foot: Secondary | ICD-10-CM

## 2018-12-15 DIAGNOSIS — I700 Atherosclerosis of aorta: Secondary | ICD-10-CM

## 2018-12-15 DIAGNOSIS — Z87891 Personal history of nicotine dependence: Secondary | ICD-10-CM | POA: Diagnosis not present

## 2018-12-15 DIAGNOSIS — R590 Localized enlarged lymph nodes: Secondary | ICD-10-CM | POA: Diagnosis not present

## 2018-12-15 DIAGNOSIS — R61 Generalized hyperhidrosis: Secondary | ICD-10-CM | POA: Insufficient documentation

## 2018-12-15 DIAGNOSIS — I2510 Atherosclerotic heart disease of native coronary artery without angina pectoris: Secondary | ICD-10-CM

## 2018-12-15 DIAGNOSIS — E611 Iron deficiency: Secondary | ICD-10-CM | POA: Diagnosis not present

## 2018-12-15 DIAGNOSIS — K8020 Calculus of gallbladder without cholecystitis without obstruction: Secondary | ICD-10-CM | POA: Insufficient documentation

## 2018-12-15 DIAGNOSIS — Z8249 Family history of ischemic heart disease and other diseases of the circulatory system: Secondary | ICD-10-CM | POA: Insufficient documentation

## 2018-12-15 DIAGNOSIS — G629 Polyneuropathy, unspecified: Secondary | ICD-10-CM

## 2018-12-15 DIAGNOSIS — I872 Venous insufficiency (chronic) (peripheral): Secondary | ICD-10-CM

## 2018-12-15 DIAGNOSIS — M7989 Other specified soft tissue disorders: Secondary | ICD-10-CM | POA: Insufficient documentation

## 2018-12-15 DIAGNOSIS — M25473 Effusion, unspecified ankle: Secondary | ICD-10-CM | POA: Insufficient documentation

## 2018-12-15 DIAGNOSIS — N6324 Unspecified lump in the left breast, lower inner quadrant: Secondary | ICD-10-CM | POA: Insufficient documentation

## 2018-12-15 LAB — CMP (CANCER CENTER ONLY)
ALT: 37 U/L (ref 0–44)
AST: 25 U/L (ref 15–41)
Albumin: 4.2 g/dL (ref 3.5–5.0)
Alkaline Phosphatase: 93 U/L (ref 38–126)
Anion gap: 12 (ref 5–15)
BUN: 21 mg/dL (ref 8–23)
CO2: 23 mmol/L (ref 22–32)
Calcium: 8.7 mg/dL — ABNORMAL LOW (ref 8.9–10.3)
Chloride: 104 mmol/L (ref 98–111)
Creatinine: 1.31 mg/dL — ABNORMAL HIGH (ref 0.44–1.00)
GFR, Est AFR Am: 47 mL/min — ABNORMAL LOW (ref >60)
GFR, Estimated: 41 mL/min — ABNORMAL LOW (ref >60)
Glucose, Bld: 96 mg/dL (ref 70–99)
Potassium: 4.2 mmol/L (ref 3.5–5.1)
Sodium: 139 mmol/L (ref 135–145)
Total Bilirubin: 0.6 mg/dL (ref 0.3–1.2)
Total Protein: 6.7 g/dL (ref 6.5–8.1)

## 2018-12-15 LAB — CBC WITH DIFFERENTIAL/PLATELET
Abs Immature Granulocytes: 0.01 10*3/uL (ref 0.00–0.07)
Basophils Absolute: 0 10*3/uL (ref 0.0–0.1)
Basophils Relative: 0 %
Eosinophils Absolute: 0 10*3/uL (ref 0.0–0.5)
Eosinophils Relative: 1 %
HCT: 31.2 % — ABNORMAL LOW (ref 36.0–46.0)
Hemoglobin: 10.2 g/dL — ABNORMAL LOW (ref 12.0–15.0)
Immature Granulocytes: 0 %
Lymphocytes Relative: 17 %
Lymphs Abs: 0.8 10*3/uL (ref 0.7–4.0)
MCH: 34.2 pg — ABNORMAL HIGH (ref 26.0–34.0)
MCHC: 32.7 g/dL (ref 30.0–36.0)
MCV: 104.7 fL — ABNORMAL HIGH (ref 80.0–100.0)
Monocytes Absolute: 0.4 10*3/uL (ref 0.1–1.0)
Monocytes Relative: 8 %
Neutro Abs: 3.4 10*3/uL (ref 1.7–7.7)
Neutrophils Relative %: 74 %
Platelets: 233 10*3/uL (ref 150–400)
RBC: 2.98 MIL/uL — ABNORMAL LOW (ref 3.87–5.11)
RDW: 23.9 % — ABNORMAL HIGH (ref 11.5–15.5)
WBC: 4.5 10*3/uL (ref 4.0–10.5)
nRBC: 0 % (ref 0.0–0.2)

## 2018-12-15 MED ORDER — HYDROXYUREA 500 MG PO CAPS: ORAL_CAPSULE | ORAL | 2 refills | Status: DC

## 2018-12-15 NOTE — Telephone Encounter (Signed)
Scheduled appt per 6/8 los. Spoke with patient and patient aware of appt date and time.

## 2018-12-28 ENCOUNTER — Other Ambulatory Visit: Payer: Self-pay | Admitting: Hematology

## 2019-01-29 DIAGNOSIS — R9389 Abnormal findings on diagnostic imaging of other specified body structures: Secondary | ICD-10-CM | POA: Diagnosis not present

## 2019-01-29 DIAGNOSIS — H348112 Central retinal vein occlusion, right eye, stable: Secondary | ICD-10-CM | POA: Diagnosis not present

## 2019-01-29 DIAGNOSIS — R590 Localized enlarged lymph nodes: Secondary | ICD-10-CM | POA: Diagnosis not present

## 2019-01-29 DIAGNOSIS — M25471 Effusion, right ankle: Secondary | ICD-10-CM | POA: Diagnosis not present

## 2019-01-29 DIAGNOSIS — M25472 Effusion, left ankle: Secondary | ICD-10-CM | POA: Diagnosis not present

## 2019-01-29 DIAGNOSIS — D869 Sarcoidosis, unspecified: Secondary | ICD-10-CM | POA: Diagnosis not present

## 2019-01-29 DIAGNOSIS — D473 Essential (hemorrhagic) thrombocythemia: Secondary | ICD-10-CM | POA: Diagnosis not present

## 2019-01-29 DIAGNOSIS — L52 Erythema nodosum: Secondary | ICD-10-CM | POA: Diagnosis not present

## 2019-01-29 DIAGNOSIS — L03119 Cellulitis of unspecified part of limb: Secondary | ICD-10-CM | POA: Diagnosis not present

## 2019-01-29 DIAGNOSIS — I7300 Raynaud's syndrome without gangrene: Secondary | ICD-10-CM | POA: Diagnosis not present

## 2019-02-15 NOTE — Progress Notes (Signed)
HEMATOLOGY/ONCOLOGY CLINIC NOTE  Date of Service: 02/16/19    Patient Care Team: Lawerance Cruel, MD as PCP - General (Family Medicine)  CHIEF COMPLAINTS/PURPOSE OF CONSULTATION:  F/u for mx of Jak2 MPN  HISTORY OF PRESENTING ILLNESS:   Alexis Murphy is a wonderful 73 y.o. 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 The pt reports that she is doing well overall. She has seen dermatology and rheumatology with work up of her sarcoidosis. She is seeing Dr Christinia Gully in one month in Pulmonology.   She notes that her skin changes first appeared 10 years ago.   The pt reports that her recent nose bx with [facility] a week ago revealed sarcoidosis and her right foot bx revealed stasis dermatitis. She has been diagnosed with sarcoidosis before this. She began a 20mg  prednisone 37-month-taper, three weeks ago with Dr. [name], and is also taking Plaquenil for her sarcoidosis. She had a CT on 09/19/17 to properly work up her sarcoidosis diagnosis.   She notes that prior to taking steroids her feet were very painful and also had some neuropathy. She also had night sweats and leg and ankle swelling but did not have fatigue or shortness of breath. These positive symptoms have resolved after beginning Prednisone.   She denies any breast symptoms. She has a MMG on 08/05/2017 which showed : IMPRESSION: New suspicious left breast mass 7:00 position retroareolar measuring 0.6 cm on ultrasound. Malignancy cannot be excluded. The mass does have similar imaging appearance to the previously biopsied left breast mass (January 2017, which demonstrated granulomatous inflammation and no evidence of malignancy at pathology.) The possibility of a second area of granulomatous inflammation is considered.  Borderline diffuse cortical thickening of a left axillary lymph node. On review of the patient's prior CT a of the  chest, patient does have mild diffuse lymphadenopathy in the thorax. At this time, axillary lymph node biopsy is not felt warranted.  The patient was given a copy of her pathology report from her left breast biopsy from January 2017. I told her she may want to share this information with her rheumatologist.  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   Of note prior to the patient's visit today, pt has had CT Chest completed on 09/19/17 with results revealing 1. Moderate mediastinal and right supraclavicular lymphadenopathy, increased. New mild bilateral axillary and bilateral hilar lymphadenopathy. Findings are worrisome for a malignant process such as a lymphoproliferative disorder or metastatic nodal disease. The right supraclavicular node may be (but not definitely) accessible for percutaneous biopsy. 2. Numerous similar-appearing subsolid spiculated pulmonary nodules scattered throughout both lungs, upper lobe predominant, largest 1.6 cm, stable to mildly increased in size and slightly increased in density in the interval. These are indeterminate, with multifocal lung adenocarcinoma on the differential. 3. Several new subcentimeter hypodense splenic lesions. Solitary small hypodense posterior right liver lobe lesion. These lesions are indeterminate, with lymphoproliferative disorder or metastatic disease not excluded. Options include further characterization with MRI abdomen without and with IV contrast or short-term follow-up CT abdomen with IV contrast in 3 months. 4. Chronic findings include: Left main and 3 vessel coronary atherosclerosis. Small hiatal hernia. Cholelithiasis.   Most recent lab results (03/10/17) of CBC  is as follows: all values are WNL except for WBC at 17.5k, MCV at 75.8, MCH at 25.2, RDW at 17.6, Platelets at 804k.  On review of systems, pt  reports dry mouth, decreased leg swelling, and denies tingling, numbness in her hands or feet, dry eyes,  problems breathing, cough, SOB, CP, nose bleeds, fatigue, abdominal pains, unexpected weight loss, fevers, chills, night sweats.   On PMHx the pt reports sarcoidosis, Bell's palsy twice.  On Social Hx the pt reports that she smoked cigarettes for 45 years. She quit smoking cigarettes 10 years ago but continues to smoke marijuana occasionally.  On Family Hx the pt reports maternal lupus.  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 The patient's last visit with [place] was on 12/15/2018. The pt reports that she is doing well overall.  The pt reports that she has not been taking PO Iron. She has no new concerns. The pt increased her Methotrexate dose because her kidney function decreased.   Lab results today (02/16/2019) of CBC w/diff and CMP is as follows: all values are WNL except for RBC at 3.05, HGB at 11.2, HCT at 33.5, MCV at 109.8, MCH at 36.7, CO2 at 21, glucose bld at 115, Creatinine at 1.41, AST at 14, GFR at 37.  On review of systems, pt reports no new concerns and denies any other symptoms.   MEDICAL HISTORY:  Past Medical History:  Diagnosis Date  . Arthritis   . Hyperlipidemia   . Sarcoidosis     SURGICAL HISTORY: Past Surgical History:  Procedure Laterality Date  . ABDOMINAL HYSTERECTOMY    . BREAST BIOPSY    . BREAST SURGERY    . REDUCTION MAMMAPLASTY      SOCIAL HISTORY: Social History   Socioeconomic History  . Marital status: Widowed    Spouse name: Not on file  . Number of children: Not on file  . Years of education: Not on file  . Highest education level: Not on file  Occupational History  . Not on file  Social Needs  . Financial resource strain: Not on file  . Food insecurity    Worry: Not on file    Inability: Not on file  . Transportation needs    Medical: Not on file    Non-medical: Not on file  Tobacco Use  . Smoking status: Former Smoker    Packs/day: 1.00    Years: 45.00    Pack years: 45.00     Types: Cigarettes    Quit date: 10/15/2007    Years since quitting: 11.3  . Smokeless tobacco: Never Used  Substance and Sexual Activity  . Alcohol use: Yes  . Drug use: No  . Sexual activity: Not on file  Lifestyle  . Physical activity    Days per week: Not on file    Minutes per session: Not on file  . Stress: Not on file  Relationships  . Social Herbalist on phone: Not on file    Gets together: Not on file    Attends religious service: Not on file    Active member of club or organization: Not on file    Attends meetings of clubs or organizations: Not on file    Relationship status: Not on file  . Intimate partner violence    Fear of current or ex partner: Not on file    Emotionally abused: Not on file    Physically abused: Not on file    Forced sexual activity: Not on file  Other Topics Concern  . Not on file  Social History Narrative  . Not on file  FAMILY HISTORY: Family History  Problem Relation Age of Onset  . Heart disease Father   . Heart disease Brother     ALLERGIES:  is allergic to penicillins and codeine.  MEDICATIONS:  Current Outpatient Medications  Medication Sig Dispense Refill  . acetaminophen (TYLENOL) 325 MG tablet Take 1 tablet (325 mg total) by mouth every 6 (six) hours. 30 tablet 0  . amLODipine (NORVASC) 5 MG tablet Take 5 mg by mouth every evening.     Marland Kitchen atorvastatin (LIPITOR) 40 MG tablet Take 40 mg by mouth every evening.     Marland Kitchen FOLBIC 2.5-25-2 MG TABS tablet Take 1 tablet by mouth daily.  3  . hydroxyurea (HYDREA) 500 MG capsule TAKE 2 TABS ON MON,WED & FRI & 1 TAB THE REST OF THE WK. MAY TAKE W/ FOOD TO MINIMIZE SIDE EFFECTS. 270 capsule 0  . Melatonin 5 MG TABS Take 5 mg by mouth at bedtime.    . methotrexate (RHEUMATREX) 2.5 MG tablet 4 TABLETS ONCE WEEKLY ORALLY 30 DAYS  2  . omeprazole (PRILOSEC) 40 MG capsule Take 40 mg by mouth every evening.     . predniSONE (DELTASONE) 20 MG tablet Take 7.5 mg by mouth daily. Taking  a weaning dose over 4 months.  0  . venlafaxine XR (EFFEXOR-XR) 150 MG 24 hr capsule Take 150 mg by mouth every evening.     . vitamin B-12 (CYANOCOBALAMIN) 1000 MCG tablet Take 1,000 mcg by mouth daily.     No current facility-administered medications for this visit.     REVIEW OF SYSTEMS:    A 10+ POINT REVIEW OF SYSTEMS WAS OBTAINED including neurology, dermatology, psychiatry, cardiac, respiratory, lymph, extremities, GI, GU, Musculoskeletal, constitutional, breasts, reproductive, HEENT.  All pertinent positives are noted in the HPI.  All others are negative.   PHYSICAL EXAMINATION: ECOG PERFORMANCE STATUS: 2 - Symptomatic, <50% confined to bed  Vitals:   02/16/19 1402  BP: (!) 156/78  Pulse: (!) 114  Resp: 18  Temp: 98 F (36.7 C)  SpO2: 97%   Filed Weights   02/16/19 1402  Weight: 168 lb 11.2 oz (76.5 kg)   .Body mass index is 31.88 kg/m.   GENERAL:alert, in no acute distress and comfortable SKIN: no acute rashes, no significant lesions EYES: conjunctiva are pink and non-injected, sclera anicteric OROPHARYNX: MMM, no exudates, no oropharyngeal erythema or ulceration NECK: supple, no JVD LYMPH:  no palpable lymphadenopathy in the cervical, axillary or inguinal regions LUNGS: clear to auscultation b/l with normal respiratory effort HEART: regular rate & rhythm ABDOMEN:  normoactive bowel sounds , non tender, not distended. No palpable hepatosplenomegaly.  Extremity: no pedal edema PSYCH: alert & oriented x 3 with fluent speech NEURO: no focal motor/sensory deficits   LABORATORY DATA:  I have reviewed the data as listed  . CBC Latest Ref Rng & Units 02/16/2019 12/15/2018 11/03/2018  WBC 4.0 - 10.5 K/uL 6.5 4.5 6.8  Hemoglobin 12.0 - 15.0 g/dL 11.2(L) 10.2(L) 11.7(L)  Hematocrit 36.0 - 46.0 % 33.5(L) 31.2(L) 36.1  Platelets 150 - 400 K/uL 281 233 330    . CMP Latest Ref Rng & Units 02/16/2019 12/15/2018 11/03/2018  Glucose 70 - 99 mg/dL 115(H) 96 105(H)  BUN 8 - 23  mg/dL 17 21 19   Creatinine 0.44 - 1.00 mg/dL 1.41(H) 1.31(H) 1.50(H)  Sodium 135 - 145 mmol/L 141 139 139  Potassium 3.5 - 5.1 mmol/L 3.5 4.2 4.1  Chloride 98 - 111 mmol/L 105 104 105  CO2 22 -  32 mmol/L 21(L) 23 19(L)  Calcium 8.9 - 10.3 mg/dL 9.5 8.7(L) 9.2  Total Protein 6.5 - 8.1 g/dL 7.0 6.7 7.2  Total Bilirubin 0.3 - 1.2 mg/dL 0.5 0.6 0.7  Alkaline Phos 38 - 126 U/L 108 93 98  AST 15 - 41 U/L 14(L) 25 38  ALT 0 - 44 U/L 12 37 98(H)           RADIOGRAPHIC STUDIES: I have personally reviewed the radiological images as listed and agreed with the findings in the report. No results found. CT chest 09/19/2017: IMPRESSION: 1. Moderate mediastinal and right supraclavicular lymphadenopathy, increased. New mild bilateral axillary and bilateral hilar lymphadenopathy. Findings are worrisome for a malignant process such as a lymphoproliferative disorder or metastatic nodal disease. The right supraclavicular node may be (but not definitely) accessible for percutaneous biopsy. 2. Numerous similar-appearing subsolid spiculated pulmonary nodules scattered throughout both lungs, upper lobe predominant, largest 1.6 cm, stable to mildly increased in size and slightly increased in density in the interval. These are indeterminate, with multifocal lung adenocarcinoma on the differential. 3. Several new subcentimeter hypodense splenic lesions. Solitary small hypodense posterior right liver lobe lesion. These lesions are indeterminate, with lymphoproliferative disorder or metastatic disease not excluded. Options include further characterization with MRI abdomen without and with IV contrast or short-term follow-up CT abdomen with IV contrast in 3 months. 4. Chronic findings include: Left main and 3 vessel coronary atherosclerosis. Small hiatal hernia. Cholelithiasis.  Aortic Atherosclerosis (ICD10-I70.0).  Electronically Signed   By: Ilona Sorrel M.D.   On: 09/19/2017 11:54    ASSESSMENT & PLAN:   73 y.o. female with  1. Generalized lymphadenopathy with multiple pulmonary nodules CT Chest 09/19/2017: Moderate mediastinal and right supraclavicular lymphadenopathy, increased. New mild bilateral axillary and bilateral hilar lymphadenopathy. Findings are worrisome for a malignant process such as a lymphoproliferative disorder or metastatic nodal disease. The right supraclavicular node may be (but not definitely) accessible for percutaneous biopsy. 2. Numerous similar-appearing subsolid spiculated pulmonary nodules scattered throughout both lungs, upper lobe predominant, largest 1.6 cm, stable to mildly increased in size and slightly increased in density in the interval. These are indeterminate, with multifocal lung adenocarcinoma on the differential.  12/26/17 CT C/A/P revealed interval response to steroids in LNadenopathy   07/17/18 CT C/A/P revealed Continue improvement in mediastinal adenopathy. Lymph nodes are now not pathologic by size criteria. 2. Continued improvement in nodular and ground-glass densities particularly in the RIGHT upper lobe. 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  2. Breast - lesion - Bx -- granulomatous inflammation -consistent with sarcoidosis.  3. Skin rash on nose - Bx consistent with sarcoidosis.  4. Iron deficiency - no significant anemia . Lab Results  Component Value Date   IRON 123 11/03/2018   TIBC 354 11/03/2018   IRONPCTSAT 35 11/03/2018   (Iron and TIBC)  Lab Results  Component Value Date   FERRITIN 47 11/03/2018   PLAN - GI w/u - will defer to PCP -?GI involvement by sarcoidosis.  5. 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 -Goal for PLT between 200k and 400k -Will continue to monitor liver functions as pt is on Methotrexate and Hydroxyurea. Methotrexate dose was recently increased. -patient symptoms have improved with prednisone and MTX that she is on from the the rheumatologist. -Continue follow up with dermatology, rheumatology and pulmonology. -Will see the pt back in 3 months   RTC with dr Irene Limbo with labs in 3 months   All of the patients questions were answered with apparent satisfaction. The patient knows to call the clinic with any problems, questions or concerns.  The total time spent in the appt was 20 minutes and more than 50% was on counseling and direct patient cares.   Sullivan Lone MD [place] AAHIVMS [facility] Hematology/Oncology Physician [facility]  (Office):       [phone] (Work cell):  [phone] (Fax):           [phone]  02/16/2019 2:49 PM  I, De Burrs, am acting as a scribe for Dr. Irene Limbo  .I have reviewed the above documentation for accuracy and completeness, and I agree with the above. Brunetta Genera MD

## 2019-02-16 ENCOUNTER — Inpatient Hospital Stay: Payer: Medicare HMO | Attending: Hematology

## 2019-02-16 ENCOUNTER — Inpatient Hospital Stay (HOSPITAL_BASED_OUTPATIENT_CLINIC_OR_DEPARTMENT_OTHER): Payer: Medicare HMO | Admitting: Hematology

## 2019-02-16 ENCOUNTER — Other Ambulatory Visit: Payer: Self-pay

## 2019-02-16 VITALS — BP 156/78 | HR 114 | Temp 98.0°F | Resp 18 | Ht 61.0 in | Wt 168.7 lb

## 2019-02-16 DIAGNOSIS — Z87891 Personal history of nicotine dependence: Secondary | ICD-10-CM | POA: Diagnosis not present

## 2019-02-16 DIAGNOSIS — Z90710 Acquired absence of both cervix and uterus: Secondary | ICD-10-CM | POA: Insufficient documentation

## 2019-02-16 DIAGNOSIS — Z79899 Other long term (current) drug therapy: Secondary | ICD-10-CM | POA: Insufficient documentation

## 2019-02-16 DIAGNOSIS — E785 Hyperlipidemia, unspecified: Secondary | ICD-10-CM | POA: Diagnosis not present

## 2019-02-16 DIAGNOSIS — D473 Essential (hemorrhagic) thrombocythemia: Secondary | ICD-10-CM | POA: Insufficient documentation

## 2019-02-16 DIAGNOSIS — D869 Sarcoidosis, unspecified: Secondary | ICD-10-CM | POA: Insufficient documentation

## 2019-02-16 LAB — CBC WITH DIFFERENTIAL/PLATELET
Abs Immature Granulocytes: 0.06 10*3/uL (ref 0.00–0.07)
Basophils Absolute: 0 10*3/uL (ref 0.0–0.1)
Basophils Relative: 0 %
Eosinophils Absolute: 0.1 10*3/uL (ref 0.0–0.5)
Eosinophils Relative: 1 %
HCT: 33.5 % — ABNORMAL LOW (ref 36.0–46.0)
Hemoglobin: 11.2 g/dL — ABNORMAL LOW (ref 12.0–15.0)
Immature Granulocytes: 1 %
Lymphocytes Relative: 12 %
Lymphs Abs: 0.8 10*3/uL (ref 0.7–4.0)
MCH: 36.7 pg — ABNORMAL HIGH (ref 26.0–34.0)
MCHC: 33.4 g/dL (ref 30.0–36.0)
MCV: 109.8 fL — ABNORMAL HIGH (ref 80.0–100.0)
Monocytes Absolute: 0.5 10*3/uL (ref 0.1–1.0)
Monocytes Relative: 7 %
Neutro Abs: 5.2 10*3/uL (ref 1.7–7.7)
Neutrophils Relative %: 79 %
Platelets: 281 10*3/uL (ref 150–400)
RBC: 3.05 MIL/uL — ABNORMAL LOW (ref 3.87–5.11)
RDW: 13.6 % (ref 11.5–15.5)
WBC: 6.5 10*3/uL (ref 4.0–10.5)
nRBC: 0 % (ref 0.0–0.2)

## 2019-02-16 LAB — CMP (CANCER CENTER ONLY)
ALT: 12 U/L (ref 0–44)
AST: 14 U/L — ABNORMAL LOW (ref 15–41)
Albumin: 4.3 g/dL (ref 3.5–5.0)
Alkaline Phosphatase: 108 U/L (ref 38–126)
Anion gap: 15 (ref 5–15)
BUN: 17 mg/dL (ref 8–23)
CO2: 21 mmol/L — ABNORMAL LOW (ref 22–32)
Calcium: 9.5 mg/dL (ref 8.9–10.3)
Chloride: 105 mmol/L (ref 98–111)
Creatinine: 1.41 mg/dL — ABNORMAL HIGH (ref 0.44–1.00)
GFR, Est AFR Am: 43 mL/min — ABNORMAL LOW (ref >60)
GFR, Estimated: 37 mL/min — ABNORMAL LOW (ref >60)
Glucose, Bld: 115 mg/dL — ABNORMAL HIGH (ref 70–99)
Potassium: 3.5 mmol/L (ref 3.5–5.1)
Sodium: 141 mmol/L (ref 135–145)
Total Bilirubin: 0.5 mg/dL (ref 0.3–1.2)
Total Protein: 7 g/dL (ref 6.5–8.1)

## 2019-02-17 ENCOUNTER — Telehealth: Payer: Self-pay | Admitting: Hematology

## 2019-02-17 NOTE — Telephone Encounter (Signed)
I talk with patient regarding schedule  

## 2019-03-05 DIAGNOSIS — D869 Sarcoidosis, unspecified: Secondary | ICD-10-CM | POA: Diagnosis not present

## 2019-03-24 DIAGNOSIS — R5381 Other malaise: Secondary | ICD-10-CM | POA: Diagnosis not present

## 2019-03-24 DIAGNOSIS — R6883 Chills (without fever): Secondary | ICD-10-CM | POA: Diagnosis not present

## 2019-03-24 DIAGNOSIS — R112 Nausea with vomiting, unspecified: Secondary | ICD-10-CM | POA: Diagnosis not present

## 2019-03-24 DIAGNOSIS — R197 Diarrhea, unspecified: Secondary | ICD-10-CM | POA: Diagnosis not present

## 2019-03-27 ENCOUNTER — Other Ambulatory Visit: Payer: Self-pay | Admitting: Hematology

## 2019-04-23 DIAGNOSIS — R69 Illness, unspecified: Secondary | ICD-10-CM | POA: Diagnosis not present

## 2019-05-04 DIAGNOSIS — H348112 Central retinal vein occlusion, right eye, stable: Secondary | ICD-10-CM | POA: Diagnosis not present

## 2019-05-04 DIAGNOSIS — I7300 Raynaud's syndrome without gangrene: Secondary | ICD-10-CM | POA: Diagnosis not present

## 2019-05-04 DIAGNOSIS — Z7952 Long term (current) use of systemic steroids: Secondary | ICD-10-CM | POA: Diagnosis not present

## 2019-05-04 DIAGNOSIS — R69 Illness, unspecified: Secondary | ICD-10-CM | POA: Diagnosis not present

## 2019-05-04 DIAGNOSIS — M25471 Effusion, right ankle: Secondary | ICD-10-CM | POA: Diagnosis not present

## 2019-05-04 DIAGNOSIS — L52 Erythema nodosum: Secondary | ICD-10-CM | POA: Diagnosis not present

## 2019-05-04 DIAGNOSIS — R9389 Abnormal findings on diagnostic imaging of other specified body structures: Secondary | ICD-10-CM | POA: Diagnosis not present

## 2019-05-04 DIAGNOSIS — D473 Essential (hemorrhagic) thrombocythemia: Secondary | ICD-10-CM | POA: Diagnosis not present

## 2019-05-04 DIAGNOSIS — D869 Sarcoidosis, unspecified: Secondary | ICD-10-CM | POA: Diagnosis not present

## 2019-05-04 DIAGNOSIS — Z1589 Genetic susceptibility to other disease: Secondary | ICD-10-CM | POA: Diagnosis not present

## 2019-05-19 ENCOUNTER — Other Ambulatory Visit: Payer: Self-pay

## 2019-05-19 ENCOUNTER — Inpatient Hospital Stay: Payer: Medicare HMO

## 2019-05-19 ENCOUNTER — Inpatient Hospital Stay: Payer: Medicare HMO | Attending: Hematology | Admitting: Hematology

## 2019-05-19 VITALS — BP 154/84 | HR 102 | Temp 98.0°F | Resp 18 | Ht 61.0 in | Wt 160.4 lb

## 2019-05-19 DIAGNOSIS — Z79899 Other long term (current) drug therapy: Secondary | ICD-10-CM | POA: Diagnosis not present

## 2019-05-19 DIAGNOSIS — D473 Essential (hemorrhagic) thrombocythemia: Secondary | ICD-10-CM | POA: Insufficient documentation

## 2019-05-19 DIAGNOSIS — E785 Hyperlipidemia, unspecified: Secondary | ICD-10-CM | POA: Diagnosis not present

## 2019-05-19 DIAGNOSIS — Z87891 Personal history of nicotine dependence: Secondary | ICD-10-CM | POA: Diagnosis not present

## 2019-05-19 DIAGNOSIS — I10 Essential (primary) hypertension: Secondary | ICD-10-CM | POA: Insufficient documentation

## 2019-05-19 LAB — CBC WITH DIFFERENTIAL/PLATELET
Abs Immature Granulocytes: 0.03 10*3/uL (ref 0.00–0.07)
Basophils Absolute: 0 10*3/uL (ref 0.0–0.1)
Basophils Relative: 0 %
Eosinophils Absolute: 0 10*3/uL (ref 0.0–0.5)
Eosinophils Relative: 0 %
HCT: 31.5 % — ABNORMAL LOW (ref 36.0–46.0)
Hemoglobin: 10.7 g/dL — ABNORMAL LOW (ref 12.0–15.0)
Immature Granulocytes: 1 %
Lymphocytes Relative: 11 %
Lymphs Abs: 0.6 10*3/uL — ABNORMAL LOW (ref 0.7–4.0)
MCH: 37 pg — ABNORMAL HIGH (ref 26.0–34.0)
MCHC: 34 g/dL (ref 30.0–36.0)
MCV: 109 fL — ABNORMAL HIGH (ref 80.0–100.0)
Monocytes Absolute: 0.4 10*3/uL (ref 0.1–1.0)
Monocytes Relative: 7 %
Neutro Abs: 4.7 10*3/uL (ref 1.7–7.7)
Neutrophils Relative %: 81 %
Platelets: 261 10*3/uL (ref 150–400)
RBC: 2.89 MIL/uL — ABNORMAL LOW (ref 3.87–5.11)
RDW: 15.4 % (ref 11.5–15.5)
WBC: 5.7 10*3/uL (ref 4.0–10.5)
nRBC: 0 % (ref 0.0–0.2)

## 2019-05-19 LAB — CMP (CANCER CENTER ONLY)
ALT: 13 U/L (ref 0–44)
AST: 14 U/L — ABNORMAL LOW (ref 15–41)
Albumin: 4.3 g/dL (ref 3.5–5.0)
Alkaline Phosphatase: 97 U/L (ref 38–126)
Anion gap: 13 (ref 5–15)
BUN: 21 mg/dL (ref 8–23)
CO2: 23 mmol/L (ref 22–32)
Calcium: 8.9 mg/dL (ref 8.9–10.3)
Chloride: 105 mmol/L (ref 98–111)
Creatinine: 1.41 mg/dL — ABNORMAL HIGH (ref 0.44–1.00)
GFR, Est AFR Am: 43 mL/min — ABNORMAL LOW (ref >60)
GFR, Estimated: 37 mL/min — ABNORMAL LOW (ref >60)
Glucose, Bld: 122 mg/dL — ABNORMAL HIGH (ref 70–99)
Potassium: 3.8 mmol/L (ref 3.5–5.1)
Sodium: 141 mmol/L (ref 135–145)
Total Bilirubin: 0.8 mg/dL (ref 0.3–1.2)
Total Protein: 6.9 g/dL (ref 6.5–8.1)

## 2019-05-19 LAB — LACTATE DEHYDROGENASE: LDH: 225 U/L — ABNORMAL HIGH (ref 98–192)

## 2019-05-19 MED ORDER — HYDROXYUREA 500 MG PO CAPS: 500.0000 mg | ORAL_CAPSULE | Freq: Every day | ORAL | 2 refills | Status: DC

## 2019-05-19 NOTE — Progress Notes (Signed)
HEMATOLOGY/ONCOLOGY CLINIC NOTE  Date of Service: 05/19/19    Patient Care Team: Lawerance Cruel, MD as PCP - General (Family Medicine)  CHIEF COMPLAINTS/PURPOSE OF CONSULTATION:  F/u for mx of Jak2 MPN  HISTORY OF PRESENTING ILLNESS:   Alexis Murphy is a wonderful 73 y.o. 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 The pt reports that she is doing well overall. She has seen dermatology and rheumatology with work up of her sarcoidosis. She is seeing Dr Christinia Gully in one month in Pulmonology.   She notes that her skin changes first appeared 10 years ago.   The pt reports that her recent nose bx with [facility] a week ago revealed sarcoidosis and her right foot bx revealed stasis dermatitis. She has been diagnosed with sarcoidosis before this. She began a 20mg  prednisone 43-month-taper, three weeks ago with Dr. [name], and is also taking Plaquenil for her sarcoidosis. She had a CT on 09/19/17 to properly work up her sarcoidosis diagnosis.   She notes that prior to taking steroids her feet were very painful and also had some neuropathy. She also had night sweats and leg and ankle swelling but did not have fatigue or shortness of breath. These positive symptoms have resolved after beginning Prednisone.   She denies any breast symptoms. She has a MMG on 08/05/2017 which showed : IMPRESSION: New suspicious left breast mass 7:00 position retroareolar measuring 0.6 cm on ultrasound. Malignancy cannot be excluded. The mass does have similar imaging appearance to the previously biopsied left breast mass (January 2017, which demonstrated granulomatous inflammation and no evidence of malignancy at pathology.) The possibility of a second area of granulomatous inflammation is considered.  Borderline diffuse cortical thickening of a left axillary lymph node. On review of the patient's prior CT a of the  chest, patient does have mild diffuse lymphadenopathy in the thorax. At this time, axillary lymph node biopsy is not felt warranted.  The patient was given a copy of her pathology report from her left breast biopsy from January 2017. I told her she may want to share this information with her rheumatologist.  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   Of note prior to the patient's visit today, pt has had CT Chest completed on 09/19/17 with results revealing 1. Moderate mediastinal and right supraclavicular lymphadenopathy, increased. New mild bilateral axillary and bilateral hilar lymphadenopathy. Findings are worrisome for a malignant process such as a lymphoproliferative disorder or metastatic nodal disease. The right supraclavicular node may be (but not definitely) accessible for percutaneous biopsy. 2. Numerous similar-appearing subsolid spiculated pulmonary nodules scattered throughout both lungs, upper lobe predominant, largest 1.6 cm, stable to mildly increased in size and slightly increased in density in the interval. These are indeterminate, with multifocal lung adenocarcinoma on the differential. 3. Several new subcentimeter hypodense splenic lesions. Solitary small hypodense posterior right liver lobe lesion. These lesions are indeterminate, with lymphoproliferative disorder or metastatic disease not excluded. Options include further characterization with MRI abdomen without and with IV contrast or short-term follow-up CT abdomen with IV contrast in 3 months. 4. Chronic findings include: Left main and 3 vessel coronary atherosclerosis. Small hiatal hernia. Cholelithiasis.   Most recent lab results (03/10/17) of CBC  is as follows: all values are WNL except for WBC at 17.5k, MCV at 75.8, MCH at 25.2, RDW at 17.6, Platelets at 804k.  On review of systems, pt  reports dry mouth, decreased leg swelling, and denies tingling, numbness in her hands or feet, dry eyes,  problems breathing, cough, SOB, CP, nose bleeds, fatigue, abdominal pains, unexpected weight loss, fevers, chills, night sweats.   On PMHx the pt reports sarcoidosis, Bell's palsy twice.  On Social Hx the pt reports that she smoked cigarettes for 45 years. She quit smoking cigarettes 10 years ago but continues to smoke marijuana occasionally.  On Family Hx the pt reports maternal lupus.  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 The patient's last visit with [place] was on 02/16/2019. The pt reports that she is doing well overall.  The pt reports was diagnosed with COVID-19 on 03/24/2019. She spent about 2.5 weeks in bed. She was placed on high-dose steroids but was not admitted to the hospital as she had no respiratory symptoms. She was not given Plaquenil or Azithromycin. She first suspected that something was wrong when she vomited for no known reason. Pt continued to have stomach upset and episodes of emesis during her infection. Pt has no residual symptoms from COVID-19 but notes extreme fatigue after her infection.   Pt has continued to take her medications, even during her COVID-19 infection. She is currently taking 4 2.5 mg tablets of Methotrexate per week and her Sarcoidosis is still stable. The last time she saw her Rheumatologist was on 10/26. She is now taking 3 tablets of Folic Acid daily per her Rheumatologist. Pt is still taking Hydroxyurea 1000 mg 1 day per week, 500 mg 6 days per week.  Lab results today (05/19/19) of CBC w/diff and CMP is as follows: all values are WNL except for RBC at 2.89, Hgb at 10.7, HCT a 31.5, MCV at 109.0, MCH at 37.0, Lymphs Abs at 0.6K, Glucose at 122, Creatinine at 1.41, AST at 14, GFR Est Non Af Am at 37. 05/19/2019 LDH at 225  On review of systems, pt denies leg swelling, abdominal pain, residual infection symptoms and any other symptoms.    MEDICAL HISTORY:  Past Medical History:  Diagnosis Date  .  Arthritis   . Hyperlipidemia   . Sarcoidosis     SURGICAL HISTORY: Past Surgical History:  Procedure Laterality Date  . ABDOMINAL HYSTERECTOMY    . BREAST BIOPSY    . BREAST SURGERY    . REDUCTION MAMMAPLASTY      SOCIAL HISTORY: Social History   Socioeconomic History  . Marital status: Widowed    Spouse name: Not on file  . Number of children: Not on file  . Years of education: Not on file  . Highest education level: Not on file  Occupational History  . Not on file  Social Needs  . Financial resource strain: Not on file  . Food insecurity    Worry: Not on file    Inability: Not on file  . Transportation needs    Medical: Not on file    Non-medical: Not on file  Tobacco Use  . Smoking status: Former Smoker    Packs/day: 1.00    Years: 45.00    Pack years: 45.00    Types: Cigarettes    Quit date: 10/15/2007    Years since quitting: 11.6  . Smokeless tobacco: Never Used  Substance and Sexual Activity  . Alcohol use: Yes  . Drug use: No  . Sexual activity: Not on file  Lifestyle  . Physical activity    Days per week: Not on file  Minutes per session: Not on file  . Stress: Not on file  Relationships  . Social Herbalist on phone: Not on file    Gets together: Not on file    Attends religious service: Not on file    Active member of club or organization: Not on file    Attends meetings of clubs or organizations: Not on file    Relationship status: Not on file  . Intimate partner violence    Fear of current or ex partner: Not on file    Emotionally abused: Not on file    Physically abused: Not on file    Forced sexual activity: Not on file  Other Topics Concern  . Not on file  Social History Narrative  . Not on file    FAMILY HISTORY: Family History  Problem Relation Age of Onset  . Heart disease Father   . Heart disease Brother     ALLERGIES:  is allergic to penicillins and codeine.  MEDICATIONS:  Current Outpatient Medications   Medication Sig Dispense Refill  . acetaminophen (TYLENOL) 325 MG tablet Take 1 tablet (325 mg total) by mouth every 6 (six) hours. 30 tablet 0  . amLODipine (NORVASC) 5 MG tablet Take 5 mg by mouth every evening.     Marland Kitchen atorvastatin (LIPITOR) 40 MG tablet Take 40 mg by mouth every evening.     Marland Kitchen FOLBIC 2.5-25-2 MG TABS tablet Take 1 tablet by mouth daily.  3  . hydroxyurea (HYDREA) 500 MG capsule Take 1 capsule (500 mg total) by mouth daily. May take with food to minimize GI side effects. 90 capsule 2  . Melatonin 5 MG TABS Take 5 mg by mouth at bedtime.    . methotrexate (RHEUMATREX) 2.5 MG tablet 4 TABLETS ONCE WEEKLY ORALLY 30 DAYS  2  . omeprazole (PRILOSEC) 40 MG capsule Take 40 mg by mouth every evening.     . predniSONE (DELTASONE) 20 MG tablet Take 7.5 mg by mouth daily. Taking a weaning dose over 4 months.  0  . venlafaxine XR (EFFEXOR-XR) 150 MG 24 hr capsule Take 150 mg by mouth every evening.     . vitamin B-12 (CYANOCOBALAMIN) 1000 MCG tablet Take 1,000 mcg by mouth daily.     No current facility-administered medications for this visit.     REVIEW OF SYSTEMS:   A 10+ POINT REVIEW OF SYSTEMS WAS OBTAINED including neurology, dermatology, psychiatry, cardiac, respiratory, lymph, extremities, GI, GU, Musculoskeletal, constitutional, breasts, reproductive, HEENT.  All pertinent positives are noted in the HPI.  All others are negative.   PHYSICAL EXAMINATION: ECOG PERFORMANCE STATUS: 2 - Symptomatic, <50% confined to bed  Vitals:   05/19/19 1149  BP: (!) 154/84  Pulse: (!) 102  Resp: 18  Temp: 98 F (36.7 C)  SpO2: 98%   Filed Weights   05/19/19 1149  Weight: 160 lb 6.4 oz (72.8 kg)   .Body mass index is 30.31 kg/m.    GENERAL:alert, in no acute distress and comfortable SKIN: no acute rashes, no significant lesions EYES: conjunctiva are pink and non-injected, sclera anicteric OROPHARYNX: MMM, no exudates, no oropharyngeal erythema or ulceration NECK: supple, no JVD  LYMPH:  no palpable lymphadenopathy in the cervical, axillary or inguinal regions LUNGS: clear to auscultation b/l with normal respiratory effort HEART: regular rate & rhythm ABDOMEN:  normoactive bowel sounds , non tender, not distended. No palpable hepatosplenomegaly.  Extremity: no pedal edema PSYCH: alert & oriented x 3 with fluent speech  NEURO: no focal motor/sensory deficits  LABORATORY DATA:  I have reviewed the data as listed  . CBC Latest Ref Rng & Units 05/19/2019 02/16/2019 12/15/2018  WBC 4.0 - 10.5 K/uL 5.7 6.5 4.5  Hemoglobin 12.0 - 15.0 g/dL 10.7(L) 11.2(L) 10.2(L)  Hematocrit 36.0 - 46.0 % 31.5(L) 33.5(L) 31.2(L)  Platelets 150 - 400 K/uL 261 281 233    . CMP Latest Ref Rng & Units 05/19/2019 02/16/2019 12/15/2018  Glucose 70 - 99 mg/dL 122(H) 115(H) 96  BUN 8 - 23 mg/dL 21 17 21   Creatinine 0.44 - 1.00 mg/dL 1.41(H) 1.41(H) 1.31(H)  Sodium 135 - 145 mmol/L 141 141 139  Potassium 3.5 - 5.1 mmol/L 3.8 3.5 4.2  Chloride 98 - 111 mmol/L 105 105 104  CO2 22 - 32 mmol/L 23 21(L) 23  Calcium 8.9 - 10.3 mg/dL 8.9 9.5 8.7(L)  Total Protein 6.5 - 8.1 g/dL 6.9 7.0 6.7  Total Bilirubin 0.3 - 1.2 mg/dL 0.8 0.5 0.6  Alkaline Phos 38 - 126 U/L 97 108 93  AST 15 - 41 U/L 14(L) 14(L) 25  ALT 0 - 44 U/L 13 12 37           RADIOGRAPHIC STUDIES: I have personally reviewed the radiological images as listed and agreed with the findings in the report. No results found. CT chest 09/19/2017: IMPRESSION: 1. Moderate mediastinal and right supraclavicular lymphadenopathy, increased. New mild bilateral axillary and bilateral hilar lymphadenopathy. Findings are worrisome for a malignant process such as a lymphoproliferative disorder or metastatic nodal disease. The right supraclavicular node may be (but not definitely) accessible for percutaneous biopsy. 2. Numerous similar-appearing subsolid spiculated pulmonary nodules scattered throughout both lungs, upper lobe predominant,  largest 1.6 cm, stable to mildly increased in size and slightly increased in density in the interval. These are indeterminate, with multifocal lung adenocarcinoma on the differential. 3. Several new subcentimeter hypodense splenic lesions. Solitary small hypodense posterior right liver lobe lesion. These lesions are indeterminate, with lymphoproliferative disorder or metastatic disease not excluded. Options include further characterization with MRI abdomen without and with IV contrast or short-term follow-up CT abdomen with IV contrast in 3 months. 4. Chronic findings include: Left main and 3 vessel coronary atherosclerosis. Small hiatal hernia. Cholelithiasis.  Aortic Atherosclerosis (ICD10-I70.0).  Electronically Signed   By: Ilona Sorrel M.D.   On: 09/19/2017 11:54   ASSESSMENT & PLAN:   73 y.o. female with  1. Generalized lymphadenopathy with multiple pulmonary nodules CT Chest 09/19/2017: Moderate mediastinal and right supraclavicular lymphadenopathy, increased. New mild bilateral axillary and bilateral hilar lymphadenopathy. Findings are worrisome for a malignant process such as a lymphoproliferative disorder or metastatic nodal disease. The right supraclavicular node may be (but not definitely) accessible for percutaneous biopsy. 2. Numerous similar-appearing subsolid spiculated pulmonary nodules scattered throughout both lungs, upper lobe predominant, largest 1.6 cm, stable to mildly increased in size and slightly increased in density in the interval. These are indeterminate, with multifocal lung adenocarcinoma on the differential.  12/26/17 CT C/A/P revealed interval response to steroids in LNadenopathy   07/17/18 CT C/A/P revealed Continue improvement in mediastinal adenopathy. Lymph nodes are now not pathologic by size criteria. 2. Continued improvement in nodular and ground-glass densities particularly in the RIGHT upper lobe. Findings are suggestive of improved pulmonary  sarcoidosis.  2. Breast - lesion - Bx -- granulomatous inflammation -consistent with sarcoidosis.  3. Skin rash on nose - Bx consistent with sarcoidosis.  4. Iron deficiency - no significant anemia . Lab Results  Component  Value Date   IRON 123 11/03/2018   TIBC 354 11/03/2018   IRONPCTSAT 35 11/03/2018   (Iron and TIBC)  Lab Results  Component Value Date   FERRITIN 47 11/03/2018   PLAN - GI w/u - will defer to PCP -?GI involvement by sarcoidosis.  5. 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 -Discussed 05/19/2019 LDH at 225 -Goal for PLT between 200K and 400K -PLTs at goal, mild anemia - most likely from Methotrexate  -Will have pt continue Hydroxyurea 500 mg daily -The pt has no prohibitive toxicities from continuing 500 mg Hydroxyurea daily at this time. -Will continue to monitor liver functions as pt is on Methotrexate and Hydroxyurea.  -Continue Folic Acid supplements -Continue follow up with Rheumatology -Will see the pt back in 3 months with labs  FOLLOW UP: RTC with Dr Irene Limbo with labs in 3 months  The total time spent in the appt was 25 minutes and more than 50% was on counseling and direct patient cares.  All of the patient's questions were answered with apparent satisfaction. The patient knows to call the clinic with any problems, questions or concerns.   Sullivan Lone MD [place]  AAHIVMS [facility] Hematology/Oncology Physician [facility]  (Office):       [phone] (Work cell):  [phone] (Fax):           [phone]  05/19/2019 12:58 PM  I, Yevette Edwards, am acting as a scribe for Dr. Sullivan Lone.   .I have reviewed the above documentation for accuracy and completeness, and I agree with the above. Brunetta Genera MD

## 2019-05-20 ENCOUNTER — Telehealth: Payer: Self-pay | Admitting: Hematology

## 2019-05-20 NOTE — Telephone Encounter (Signed)
Scheduled per los. Called and spoke with patient. Confirmed appt 

## 2019-06-26 ENCOUNTER — Telehealth (HOSPITAL_COMMUNITY): Payer: Self-pay | Admitting: *Deleted

## 2019-06-26 NOTE — Telephone Encounter (Signed)

## 2019-06-28 ENCOUNTER — Other Ambulatory Visit: Payer: Self-pay

## 2019-06-28 DIAGNOSIS — I70213 Atherosclerosis of native arteries of extremities with intermittent claudication, bilateral legs: Secondary | ICD-10-CM

## 2019-06-29 ENCOUNTER — Ambulatory Visit (HOSPITAL_COMMUNITY)
Admission: RE | Admit: 2019-06-29 | Discharge: 2019-06-29 | Disposition: A | Discharge status: Home / Self Care | Payer: Medicare HMO | Source: Ambulatory Visit | Attending: Surgery | Admitting: Surgery

## 2019-06-29 ENCOUNTER — Other Ambulatory Visit: Payer: Self-pay

## 2019-06-29 ENCOUNTER — Ambulatory Visit (INDEPENDENT_AMBULATORY_CARE_PROVIDER_SITE_OTHER): Payer: Medicare HMO | Admitting: Surgery

## 2019-06-29 ENCOUNTER — Encounter: Payer: Self-pay | Admitting: Surgery

## 2019-06-29 VITALS — BP 135/83 | HR 82 | Temp 97.2°F | Resp 20 | Ht 61.0 in | Wt 164.3 lb

## 2019-06-29 DIAGNOSIS — H349 Unspecified retinal vascular occlusion: Secondary | ICD-10-CM | POA: Diagnosis not present

## 2019-06-29 DIAGNOSIS — I70213 Atherosclerosis of native arteries of extremities with intermittent claudication, bilateral legs: Secondary | ICD-10-CM

## 2019-06-29 NOTE — Progress Notes (Signed)
[facility]  Patient name: Alexis Murphy MRN: [id number] DOB: 12-Nov-1945 Sex: female   REASON FOR VISIT:    Follow up  Alexis Murphy:   Alexis Murphy is a 73 y.o. female who I initially saw on 11/05/2016 for evaluation of leg pain which has been going on for approximately 4 years.  It initially presented with a problem in her left fifth toe which was felt to be an ingrown toenail.  This was removed but her symptoms did not improve.  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  She states that she will occasionally get severe pain and bluish discoloration of her toes.  She has taken Neurontin which helped.  She tries to avoid cold temperatures and she does better in the summertime.  She has tried trying Tylenol, but this did not give her any relief.  She has taken a calcium channel blocker for the possibility of Raynauds.  She does have a history of autoimmune disease in her family, with her mother having been diagnosed with lupus.  She does not have any claudication symptoms.  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  Because of the diffuse nature of the process I did not think she was a surgical candidate.  I recommended treating it medically.  She states that she bruises too easily with blood thinners.  I started her on an 81 mg aspirin.    In the interval since I last seen her, she was diagnosed with sarcoidosis.  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  She has stopped taking the aspirin we had recommended but has now restarted it.  She has not had any other neurologic episodes  Since her last visit she has been doing very well without any new  events.  PAST MEDICAL HISTORY:   Past Medical History:  Diagnosis Date  . Arthritis   . Hyperlipidemia   . Sarcoidosis      FAMILY HISTORY:   Family History  Problem Relation Age of Onset  . Heart disease Father   . Heart disease Brother     SOCIAL HISTORY:   Social History   Tobacco Use  . Smoking status: Former Smoker    Packs/day: 1.00    Years: 45.00    Pack years: 45.00    Types: Cigarettes    Quit date: 10/15/2007    Years since quitting: 11.7  . Smokeless tobacco: Never Used  Substance Use Topics  . Alcohol use: Yes     ALLERGIES:   Allergies  Allergen Reactions  . Penicillins Rash and Anaphylaxis    Has patient had a PCN reaction causing immediate rash, facial/tongue/throat swelling, SOB or lightheadedness with hypotension: yes Has patient had a PCN reaction causing severe rash involving mucus membranes or skin necrosis:  no Has patient had a PCN reaction that required hospitalization:  no Has patient had a PCN reaction occurring within the last 10 years: no If all of the above answers are "NO", then may proceed with Cephalosporin use.   . Codeine Other (See Comments)    Vomiting      CURRENT MEDICATIONS:   Current Outpatient Medications  Medication Sig Dispense Refill  . acetaminophen (TYLENOL)  325 MG tablet Take 1 tablet (325 mg total) by mouth every 6 (six) hours. 30 tablet 0  . amLODipine (NORVASC) 5 MG tablet Take 5 mg by mouth every evening.     Marland Kitchen atorvastatin (LIPITOR) 40 MG tablet Take 40 mg by mouth every evening.     Marland Kitchen FOLBIC 2.5-25-2 MG TABS tablet Take 1 tablet by mouth daily.  3  . hydroxyurea (HYDREA) 500 MG capsule Take 1 capsule (500 mg total) by mouth daily. May take with food to minimize GI side effects. 90 capsule 2  . Melatonin 5 MG TABS Take 5 mg by mouth at bedtime.    . methotrexate (RHEUMATREX) 2.5 MG tablet 4 TABLETS ONCE WEEKLY ORALLY 30 DAYS  2  . omeprazole (PRILOSEC) 40 MG capsule Take 40 mg by mouth every evening.      . predniSONE (DELTASONE) 20 MG tablet Take 7.5 mg by mouth daily. Taking a weaning dose over 4 months.  0  . venlafaxine XR (EFFEXOR-XR) 150 MG 24 hr capsule Take 150 mg by mouth every evening.     . vitamin B-12 (CYANOCOBALAMIN) 1000 MCG tablet Take 1,000 mcg by mouth daily.     No current facility-administered medications for this visit.    REVIEW OF SYSTEMS:   [X]  denotes positive finding, [ ]  denotes negative finding Cardiac  Comments:  Chest pain or chest pressure:    Shortness of breath upon exertion:    Short of breath when lying flat:    Irregular heart rhythm:        Vascular    Pain in calf, thigh, or hip brought on by ambulation:    Pain in feet at night that wakes you up from your sleep:     Blood clot in your veins:    Leg swelling:         Pulmonary    Oxygen at home:    Productive cough:     Wheezing:         Neurologic    Sudden weakness in arms or legs:     Sudden numbness in arms or legs:     Sudden onset of difficulty speaking or slurred speech:    Temporary loss of vision in one eye:     Problems with dizziness:         Gastrointestinal    Blood in stool:     Vomited blood:         Genitourinary    Burning when urinating:     Blood in urine:        Psychiatric    Major depression:         Hematologic    Bleeding problems:    Problems with blood clotting too easily:        Skin    Rashes or ulcers:        Constitutional    Fever or chills:      PHYSICAL EXAM:   Vitals:   06/29/19 1128  BP: 135/83  Pulse: 82  Resp: 20  Temp: (!) 97.2 F (36.2 C)  SpO2: 95%  Weight: 164 lb 4.8 oz (74.5 kg)  Height: 5\' 1"  (1.549 m)    GENERAL: The patient is a well-nourished female, in no acute distress. The vital signs are documented above. CARDIAC: There is a regular rate and rhythm.  VASCULAR: Palpable pedal pulses bilaterally PULMONARY: Non-labored respirations MUSCULOSKELETAL: There are no major deformities or cyanosis. NEUROLOGIC: No  focal weakness or paresthesias are detected.  SKIN: There are no ulcers or rashes noted. PSYCHIATRIC: The patient has a normal affect.  STUDIES:   I have reviewed the following:  Carotid (05-26-2018): Right Carotid: Velocities in the right ICA are consistent with a 1-39% stenosis.  Left Carotid: Velocities in the left ICA are consistent with a 1-39% stenosis.  Vertebrals:  Bilateral vertebral arteries demonstrate antegrade flow. Subclavians: Normal flow hemodynamics were seen in bilateral subclavian              arteries.  +-------+-----------+-----------+------------+------------+ ABI/TBIToday's ABIToday's TBIPrevious ABIPrevious TBI +-------+-----------+-----------+------------+------------+ Right  1.04       0.00       1.01        0.00         +-------+-----------+-----------+------------+------------+ Left   1.10       0.54       1.11        0.76         +-------+-----------+-----------+------------+------------+   MEDICAL ISSUES:   Aortic atherosclerosis: I had suspected that this was the etiology for her left fifth toe problem and likely her retinal artery occlusion.  Because of the diffuse nature and location, this is not surgically correctable therefore management will be based off of medication including aspirin which she is now taking.  I plan on having her follow-up in 2 years at that time I will repeat her CT angiogram to make sure there has not been interval progression or change of her aortic atherosclerotic disease.  She will contact me sooner if she develops any new symptoms.    Leia Alf, MD, FACS [facility] [phone] Pager [phone]

## 2019-07-14 DIAGNOSIS — M1990 Unspecified osteoarthritis, unspecified site: Secondary | ICD-10-CM | POA: Diagnosis not present

## 2019-07-14 DIAGNOSIS — R69 Illness, unspecified: Secondary | ICD-10-CM | POA: Diagnosis not present

## 2019-07-14 DIAGNOSIS — E7800 Pure hypercholesterolemia, unspecified: Secondary | ICD-10-CM | POA: Diagnosis not present

## 2019-07-26 ENCOUNTER — Emergency Department (HOSPITAL_COMMUNITY): Payer: Medicare HMO

## 2019-07-26 ENCOUNTER — Encounter (HOSPITAL_COMMUNITY): Payer: Self-pay | Admitting: Emergency Medicine

## 2019-07-26 ENCOUNTER — Inpatient Hospital Stay (HOSPITAL_COMMUNITY)
Admission: EM | Admit: 2019-07-26 | Discharge: 2019-08-01 | DRG: 871 | Disposition: A | Discharge status: Home Health | Payer: Medicare HMO | Attending: Internal Medicine | Admitting: Internal Medicine

## 2019-07-26 ENCOUNTER — Other Ambulatory Visit: Payer: Self-pay

## 2019-07-26 DIAGNOSIS — J189 Pneumonia, unspecified organism: Secondary | ICD-10-CM | POA: Diagnosis not present

## 2019-07-26 DIAGNOSIS — A419 Sepsis, unspecified organism: Secondary | ICD-10-CM | POA: Diagnosis present

## 2019-07-26 DIAGNOSIS — A4151 Sepsis due to Escherichia coli [E. coli]: Principal | ICD-10-CM | POA: Diagnosis present

## 2019-07-26 DIAGNOSIS — Z7952 Long term (current) use of systemic steroids: Secondary | ICD-10-CM

## 2019-07-26 DIAGNOSIS — R7881 Bacteremia: Secondary | ICD-10-CM | POA: Diagnosis present

## 2019-07-26 DIAGNOSIS — I351 Nonrheumatic aortic (valve) insufficiency: Secondary | ICD-10-CM | POA: Diagnosis not present

## 2019-07-26 DIAGNOSIS — E876 Hypokalemia: Secondary | ICD-10-CM | POA: Diagnosis not present

## 2019-07-26 DIAGNOSIS — E785 Hyperlipidemia, unspecified: Secondary | ICD-10-CM | POA: Diagnosis present

## 2019-07-26 DIAGNOSIS — R000 Tachycardia, unspecified: Secondary | ICD-10-CM | POA: Diagnosis not present

## 2019-07-26 DIAGNOSIS — R112 Nausea with vomiting, unspecified: Secondary | ICD-10-CM | POA: Diagnosis present

## 2019-07-26 DIAGNOSIS — Z8249 Family history of ischemic heart disease and other diseases of the circulatory system: Secondary | ICD-10-CM | POA: Diagnosis not present

## 2019-07-26 DIAGNOSIS — Z03818 Encounter for observation for suspected exposure to other biological agents ruled out: Secondary | ICD-10-CM | POA: Diagnosis not present

## 2019-07-26 DIAGNOSIS — I480 Paroxysmal atrial fibrillation: Secondary | ICD-10-CM | POA: Diagnosis not present

## 2019-07-26 DIAGNOSIS — I4891 Unspecified atrial fibrillation: Secondary | ICD-10-CM | POA: Diagnosis not present

## 2019-07-26 DIAGNOSIS — D6959 Other secondary thrombocytopenia: Secondary | ICD-10-CM | POA: Diagnosis not present

## 2019-07-26 DIAGNOSIS — D860 Sarcoidosis of lung: Secondary | ICD-10-CM | POA: Diagnosis not present

## 2019-07-26 DIAGNOSIS — Z1612 Extended spectrum beta lactamase (ESBL) resistance: Secondary | ICD-10-CM | POA: Diagnosis not present

## 2019-07-26 DIAGNOSIS — R6520 Severe sepsis without septic shock: Secondary | ICD-10-CM | POA: Diagnosis not present

## 2019-07-26 DIAGNOSIS — Z87891 Personal history of nicotine dependence: Secondary | ICD-10-CM | POA: Diagnosis not present

## 2019-07-26 DIAGNOSIS — Z79899 Other long term (current) drug therapy: Secondary | ICD-10-CM | POA: Diagnosis not present

## 2019-07-26 DIAGNOSIS — R197 Diarrhea, unspecified: Secondary | ICD-10-CM | POA: Diagnosis present

## 2019-07-26 DIAGNOSIS — Z880 Allergy status to penicillin: Secondary | ICD-10-CM | POA: Diagnosis not present

## 2019-07-26 DIAGNOSIS — J9601 Acute respiratory failure with hypoxia: Secondary | ICD-10-CM | POA: Diagnosis not present

## 2019-07-26 DIAGNOSIS — R1111 Vomiting without nausea: Secondary | ICD-10-CM | POA: Diagnosis not present

## 2019-07-26 DIAGNOSIS — I340 Nonrheumatic mitral (valve) insufficiency: Secondary | ICD-10-CM | POA: Diagnosis not present

## 2019-07-26 DIAGNOSIS — Z8616 Personal history of COVID-19: Secondary | ICD-10-CM | POA: Diagnosis not present

## 2019-07-26 DIAGNOSIS — I129 Hypertensive chronic kidney disease with stage 1 through stage 4 chronic kidney disease, or unspecified chronic kidney disease: Secondary | ICD-10-CM | POA: Diagnosis present

## 2019-07-26 DIAGNOSIS — N179 Acute kidney failure, unspecified: Secondary | ICD-10-CM | POA: Diagnosis present

## 2019-07-26 DIAGNOSIS — Z7982 Long term (current) use of aspirin: Secondary | ICD-10-CM

## 2019-07-26 DIAGNOSIS — N1831 Chronic kidney disease, stage 3a: Secondary | ICD-10-CM | POA: Diagnosis present

## 2019-07-26 DIAGNOSIS — R531 Weakness: Secondary | ICD-10-CM | POA: Diagnosis not present

## 2019-07-26 DIAGNOSIS — D61818 Other pancytopenia: Secondary | ICD-10-CM | POA: Diagnosis not present

## 2019-07-26 DIAGNOSIS — R0602 Shortness of breath: Secondary | ICD-10-CM | POA: Diagnosis not present

## 2019-07-26 DIAGNOSIS — Z20828 Contact with and (suspected) exposure to other viral communicable diseases: Secondary | ICD-10-CM | POA: Diagnosis not present

## 2019-07-26 DIAGNOSIS — Z885 Allergy status to narcotic agent status: Secondary | ICD-10-CM

## 2019-07-26 LAB — CBC
HCT: 29.9 % — ABNORMAL LOW (ref 36.0–46.0)
HCT: 28.5 % — ABNORMAL LOW (ref 36.0–46.0)
Hemoglobin: 10.3 g/dL — ABNORMAL LOW (ref 12.0–15.0)
Hemoglobin: 9.7 g/dL — ABNORMAL LOW (ref 12.0–15.0)
MCH: 35.9 pg — ABNORMAL HIGH (ref 26.0–34.0)
MCH: 36.3 pg — ABNORMAL HIGH (ref 26.0–34.0)
MCHC: 34.4 g/dL (ref 30.0–36.0)
MCHC: 34 g/dL (ref 30.0–36.0)
MCV: 104.2 fL — ABNORMAL HIGH (ref 80.0–100.0)
MCV: 106.7 fL — ABNORMAL HIGH (ref 80.0–100.0)
Platelets: 134 10*3/uL — ABNORMAL LOW (ref 150–400)
Platelets: 119 10*3/uL — ABNORMAL LOW (ref 150–400)
RBC: 2.87 MIL/uL — ABNORMAL LOW (ref 3.87–5.11)
RBC: 2.67 MIL/uL — ABNORMAL LOW (ref 3.87–5.11)
RDW: 14.3 % (ref 11.5–15.5)
RDW: 14.5 % (ref 11.5–15.5)
WBC: 16.9 10*3/uL — ABNORMAL HIGH (ref 4.0–10.5)
WBC: 15.2 10*3/uL — ABNORMAL HIGH (ref 4.0–10.5)
nRBC: 0 % (ref 0.0–0.2)
nRBC: 0 % (ref 0.0–0.2)

## 2019-07-26 LAB — CREATININE, SERUM
Creatinine, Ser: 2.55 mg/dL — ABNORMAL HIGH (ref 0.44–1.00)
GFR calc Af Amer: 21 mL/min — ABNORMAL LOW (ref >60)
GFR calc non Af Amer: 18 mL/min — ABNORMAL LOW (ref >60)

## 2019-07-26 LAB — URINALYSIS, ROUTINE W REFLEX MICROSCOPIC
Bilirubin Urine: NEGATIVE
Glucose, UA: NEGATIVE mg/dL
Ketones, ur: NEGATIVE mg/dL
Nitrite: NEGATIVE
Protein, ur: 30 mg/dL — AB
Specific Gravity, Urine: 1.005 (ref 1.005–1.030)
pH: 7 (ref 5.0–8.0)

## 2019-07-26 LAB — COMPREHENSIVE METABOLIC PANEL
ALT: 17 U/L (ref 0–44)
AST: 21 U/L (ref 15–41)
Albumin: 3.5 g/dL (ref 3.5–5.0)
Alkaline Phosphatase: 114 U/L (ref 38–126)
Anion gap: 14 (ref 5–15)
BUN: 40 mg/dL — ABNORMAL HIGH (ref 8–23)
CO2: 22 mmol/L (ref 22–32)
Calcium: 8.2 mg/dL — ABNORMAL LOW (ref 8.9–10.3)
Chloride: 93 mmol/L — ABNORMAL LOW (ref 98–111)
Creatinine, Ser: 2.54 mg/dL — ABNORMAL HIGH (ref 0.44–1.00)
GFR calc Af Amer: 21 mL/min — ABNORMAL LOW (ref >60)
GFR calc non Af Amer: 18 mL/min — ABNORMAL LOW (ref >60)
Glucose, Bld: 114 mg/dL — ABNORMAL HIGH (ref 70–99)
Potassium: 2.8 mmol/L — ABNORMAL LOW (ref 3.5–5.1)
Sodium: 129 mmol/L — ABNORMAL LOW (ref 135–145)
Total Bilirubin: 1.3 mg/dL — ABNORMAL HIGH (ref 0.3–1.2)
Total Protein: 6.3 g/dL — ABNORMAL LOW (ref 6.5–8.1)

## 2019-07-26 LAB — D-DIMER, QUANTITATIVE: D-Dimer, Quant: 8.64 ug/mL-FEU — ABNORMAL HIGH (ref 0.00–0.50)

## 2019-07-26 LAB — TROPONIN I (HIGH SENSITIVITY): Troponin I (High Sensitivity): 28 ng/L — ABNORMAL HIGH (ref <18)

## 2019-07-26 LAB — PROCALCITONIN: Procalcitonin: 134.68 ng/mL

## 2019-07-26 LAB — FERRITIN: Ferritin: 97 ng/mL (ref 11–307)

## 2019-07-26 LAB — TRIGLYCERIDES: Triglycerides: 167 mg/dL — ABNORMAL HIGH (ref <150)

## 2019-07-26 LAB — C-REACTIVE PROTEIN: CRP: 28.4 mg/dL — ABNORMAL HIGH (ref <1.0)

## 2019-07-26 LAB — FIBRINOGEN: Fibrinogen: >800 mg/dL — ABNORMAL HIGH (ref 210–475)

## 2019-07-26 LAB — LACTATE DEHYDROGENASE: LDH: 267 U/L — ABNORMAL HIGH (ref 98–192)

## 2019-07-26 LAB — LACTIC ACID, PLASMA
Lactic Acid, Venous: 2 mmol/L (ref 0.5–1.9)
Lactic Acid, Venous: 1.9 mmol/L (ref 0.5–1.9)

## 2019-07-26 LAB — LIPASE, BLOOD: Lipase: 15 U/L (ref 11–51)

## 2019-07-26 MED ORDER — POTASSIUM CHLORIDE 10 MEQ/100ML IV SOLN
10.0000 meq | INTRAVENOUS | Status: AC
  Administered 2019-07-26 – 2019-07-27 (×3): 10 meq via INTRAVENOUS
  Filled 2019-07-26 (×3): qty 100

## 2019-07-26 MED ORDER — DEXAMETHASONE SODIUM PHOSPHATE 10 MG/ML IJ SOLN
10.0000 mg | Freq: Once | INTRAMUSCULAR | Status: AC
  Administered 2019-07-26: 10 mg via INTRAVENOUS
  Filled 2019-07-26: qty 1

## 2019-07-26 MED ORDER — LEVOFLOXACIN IN D5W 750 MG/150ML IV SOLN
750.0000 mg | Freq: Once | INTRAVENOUS | Status: AC
  Administered 2019-07-26: 750 mg via INTRAVENOUS
  Filled 2019-07-26: qty 150

## 2019-07-26 MED ORDER — ALBUTEROL SULFATE HFA 108 (90 BASE) MCG/ACT IN AERS
2.0000 | INHALATION_SPRAY | Freq: Once | RESPIRATORY_TRACT | Status: AC
  Administered 2019-07-26: 2 via RESPIRATORY_TRACT
  Filled 2019-07-26: qty 6.7

## 2019-07-26 MED ORDER — SODIUM CHLORIDE 0.9 % IV BOLUS
1000.0000 mL | Freq: Once | INTRAVENOUS | Status: AC
  Administered 2019-07-26: 1000 mL via INTRAVENOUS

## 2019-07-26 MED ORDER — HEPARIN SODIUM (PORCINE) 5000 UNIT/ML IJ SOLN
5000.0000 [IU] | Freq: Three times a day (TID) | INTRAMUSCULAR | Status: DC
  Administered 2019-07-27 – 2019-07-29 (×8): 5000 [IU] via SUBCUTANEOUS
  Filled 2019-07-26 (×8): qty 1

## 2019-07-26 NOTE — ED Notes (Signed)
Pt states she has has multiple episodes of emesis and diarrhea since Thursday Jul 23, 2019. Pt states she tested covid positive in sept. 2020 and her symptoms feel similar to then. Pt states that her son is also having similar symptoms. She states she tested negative at [facility] today. Pt denies fever or any other symptoms.

## 2019-07-26 NOTE — ED Triage Notes (Signed)
Per pt, states she started having abdominal discomfort, vomiting, weakness since Thursday-states she thought she had covid again but tested negative at [facility]-states she had covid in September

## 2019-07-26 NOTE — ED Notes (Signed)
Date and time results received: 07/26/19 7:27 PM  (use smartphrase ".now" to insert current time)  Test: Lactic Acid Critical Value: 2.0  Name of Provider Notified: Dr.Yao  Orders Received? Or Actions Taken?:

## 2019-07-26 NOTE — ED Provider Notes (Signed)
[facility] DEPT Provider Note   CSN: [id number] Arrival date & time: 07/26/19  1607     History Chief Complaint  Patient presents with  . Emesis    PATRICA MENDELL is a 74 y.o. female hx of HL, PAD, sarcoidosis, here presenting with abdominal pain, shortness of breath .  Patient states that she has been having vomiting and diarrhea for the last 3 days.  Patient states that she also has some shortness of breath with minimal exertion.  Subjective chills as well.  She went to [facility] and was tested negative for Covid antigen.  Patient states that she had Covid back in September and had similar symptoms.  She was not hospitalized at that time.  The history is provided by the patient.       Past Medical History:  Diagnosis Date  . Arthritis   . Hyperlipidemia   . Sarcoidosis     Patient Active Problem List   Diagnosis Date Noted  . 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 PAD (peripheral artery disease) ([place]) 05/26/2018  . Sarcoidosis 10/29/2017    Past Surgical History:  Procedure Laterality Date  . ABDOMINAL HYSTERECTOMY    . BREAST BIOPSY    . BREAST SURGERY    . REDUCTION MAMMAPLASTY       OB History   No obstetric history on file.     Family History  Problem Relation Age of Onset  . Heart disease Father   . Heart disease Brother     Social History   Tobacco Use  . Smoking status: Former Smoker    Packs/day: 1.00    Years: 45.00    Pack years: 45.00    Types: Cigarettes    Quit date: 10/15/2007    Years since quitting: 11.7  . Smokeless tobacco: Never Used  Substance Use Topics  . Alcohol use: Yes  . Drug use: No    Home Medications Prior to Admission medications   Medication Sig Start Date End Date Taking? Authorizing Provider  acetaminophen (TYLENOL) 325 MG tablet Take 1 tablet (325 mg total) by mouth every 6 (six) hours. 03/10/17   Caccavale, Sophia, PA-C  amLODipine (NORVASC) 5 MG tablet Take 5 mg by mouth every  evening.  08/27/16   [provider]  atorvastatin (LIPITOR) 40 MG tablet Take 40 mg by mouth every evening.  08/27/16   [provider]  FOLBIC 2.5-25-2 MG TABS tablet Take 1 tablet by mouth daily. 10/23/17   [provider]  hydroxyurea (HYDREA) 500 MG capsule Take 1 capsule (500 mg total) by mouth daily. May take with food to minimize GI side effects. 05/19/19   Brunetta Genera, MD  Melatonin 5 MG TABS Take 5 mg by mouth at bedtime.    [provider]  methotrexate (RHEUMATREX) 2.5 MG tablet 4 TABLETS ONCE WEEKLY ORALLY 30 DAYS 05/01/18   [provider]  omeprazole (PRILOSEC) 40 MG capsule Take 40 mg by mouth every evening.     [provider]  predniSONE (DELTASONE) 20 MG tablet Take 7.5 mg by mouth daily. Taking a weaning dose over 4 months. 11/05/17   [provider]  venlafaxine XR (EFFEXOR-XR) 150 MG 24 hr capsule Take 150 mg by mouth every evening.  08/27/16   [provider]  vitamin B-12 (CYANOCOBALAMIN) 1000 MCG tablet Take 1,000 mcg by mouth daily.    [provider]    Allergies    Penicillins and Codeine  Review of Systems  Review of Systems  Gastrointestinal: Positive for vomiting.  All other systems reviewed and are negative.   Physical Exam Updated Vital Signs BP (!) 154/76 (BP Location: Right Arm)   Pulse (!) 117   Temp 98.9 F (37.2 C) (Oral)   Resp (!) 22   SpO2 96%   Physical Exam Vitals and nursing note reviewed.  HENT:     Head: Normocephalic.     Nose: Nose normal.     Mouth/Throat:     Mouth: Mucous membranes are dry.  Eyes:     Extraocular Movements: Extraocular movements intact.     Pupils: Pupils are equal, round, and reactive to light.  Cardiovascular:     Rate and Rhythm: Regular rhythm. Tachycardia present.  Pulmonary:     Comments: Slightly tachypneic, diminished bilateral bases  Abdominal:     General: Abdomen is flat.     Palpations: Abdomen is soft.      Comments: Mild epigastric tenderness   Musculoskeletal:        General: Normal range of motion.     Cervical back: Normal range of motion.  Skin:    General: Skin is warm.     Capillary Refill: Capillary refill takes less than 2 seconds.  Neurological:     General: No focal deficit present.     Mental Status: She is alert and oriented to person, place, and time.  Psychiatric:        Mood and Affect: Mood normal.        Behavior: Behavior normal.     ED Results / Procedures / Treatments   Labs (all labs ordered are listed, but only abnormal results are displayed) Labs Reviewed  COMPREHENSIVE METABOLIC PANEL - Abnormal; Notable for the following components:      Result Value   Sodium 129 (*)    Potassium 2.8 (*)    Chloride 93 (*)    Glucose, Bld 114 (*)    BUN 40 (*)    Creatinine, Ser 2.54 (*)    Calcium 8.2 (*)    Total Protein 6.3 (*)    Total Bilirubin 1.3 (*)    GFR calc non Af Amer 18 (*)    GFR calc Af Amer 21 (*)    All other components within normal limits  CBC - Abnormal; Notable for the following components:   WBC 16.9 (*)    RBC 2.87 (*)    Hemoglobin 10.3 (*)    HCT 29.9 (*)    MCV 104.2 (*)    MCH 35.9 (*)    Platelets 134 (*)    All other components within normal limits  LACTIC ACID, PLASMA - Abnormal; Notable for the following components:   Lactic Acid, Venous 2.0 (*)    All other components within normal limits  D-DIMER, QUANTITATIVE (NOT AT [facility]) - Abnormal; Notable for the following components:   D-Dimer, Quant 8.64 (*)    All other components within normal limits  TRIGLYCERIDES - Abnormal; Notable for the following components:   Triglycerides 167 (*)    All other components within normal limits  FIBRINOGEN - Abnormal; Notable for the following components:   Fibrinogen >800 (*)    All other components within normal limits  C-REACTIVE PROTEIN - Abnormal; Notable for the following components:   CRP 28.4 (*)    All other components within normal  limits  TROPONIN I (HIGH SENSITIVITY) - Abnormal; Notable for the following components:   Troponin I (High Sensitivity) 28 (*)  All other components within normal limits  SARS CORONAVIRUS 2 (TAT 6-24 HRS)  CULTURE, BLOOD (ROUTINE X 2)  CULTURE, BLOOD (ROUTINE X 2)  LIPASE, BLOOD  PROCALCITONIN  FERRITIN  URINALYSIS, ROUTINE W REFLEX MICROSCOPIC  LACTIC ACID, PLASMA  LACTATE DEHYDROGENASE    EKG EKG Interpretation  Date/Time:  Sunday July 26 2019 18:29:44 EST Ventricular Rate:  112 PR Interval:    QRS Duration: 102 QT Interval:  330 QTC Calculation: 451 R Axis:   83 Text Interpretation: Sinus tachycardia Atrial premature complexes Borderline right axis deviation Borderline low voltage, extremity leads No previous ECGs available Confirmed by Wandra Arthurs ([id number]) on 07/26/2019 6:37:03 PM   Radiology DG Chest Port 1 View  Result Date: 07/26/2019 CLINICAL DATA:  Shortness of breath, vomiting EXAM: PORTABLE CHEST 1 VIEW COMPARISON:  Radiograph 10/29/2017, CT 07/17/2018 FINDINGS: Slight left anterior obliquity accentuating the cardiomediastinal silhouette and superimposing the mediastinum over the right lung. Coarsened interstitial changes are similar to comparison is. Some new reticulonodular opacity seen in the right mid lung. No pneumothorax. No effusion. The aorta is calcified. The remaining cardiomediastinal contours are unremarkable accounting for technique and rotation. No acute osseous or soft tissue abnormality. Degenerative changes are present in the imaged spine and shoulders. IMPRESSION: 1. New reticulonodular opacity seen in the right mid lung which may represent pneumonia. Recommend follow-up imaging to resolution. 2. Stable chronic interstitial changes. 3.  Aortic Atherosclerosis (ICD10-I70.0). Electronically Signed   By: Lovena Le M.D.   On: 07/26/2019 17:23   CT Renal Stone Study  Result Date: 07/26/2019 CLINICAL DATA:  74 year old female with abdominal discomfort,  nausea vomiting. Flank pain. EXAM: CT ABDOMEN AND PELVIS WITHOUT CONTRAST TECHNIQUE: Multidetector CT imaging of the abdomen and pelvis was performed following the standard protocol without IV contrast. COMPARISON:  CT abdomen pelvis dated 07/17/2018. FINDINGS: Evaluation of this exam is limited in the absence of intravenous contrast. Lower chest: The visualized lung bases are clear. Multi vessel coronary vascular calcification noted. No intra-abdominal free air or free fluid. Hepatobiliary: Mild fatty infiltration of the liver. No intrahepatic biliary ductal dilatation. Several stones noted within the gallbladder. No pericholecystic fluid or evidence of acute cholecystitis by CT. Pancreas: Unremarkable. No pancreatic ductal dilatation or surrounding inflammatory changes. Spleen: Normal in size without focal abnormality. Adrenals/Urinary Tract: A 17 mm left adrenal nodule, likely an adenoma. The right adrenal gland is unremarkable. There is no hydronephrosis or nephrolithiasis on either side. A 2 cm right renal inferior pole hypodense lesion is not characterized on this noncontrast CT but appears similar to prior CT. Mild bilateral perinephric stranding, nonspecific. Correlation with urinalysis recommended to evaluate for UTI. The urinary bladder is grossly unremarkable. Stomach/Bowel: There is a small hiatal hernia. Scattered colonic diverticula without active inflammatory changes. There is no bowel obstruction or active inflammation. The appendix is normal. Vascular/Lymphatic: Advanced aortoiliac atherosclerotic disease. The IVC is unremarkable. No portal venous gas. There is no adenopathy. Reproductive: Hysterectomy.  No adnexal masses. Other: None Musculoskeletal: Osteopenia with multilevel degenerative changes of the spine with disc desiccation and vacuum phenomena. No acute osseous pathology. IMPRESSION: 1. No hydronephrosis or nephrolithiasis. Correlation with urinalysis recommended to evaluate for UTI. 2.  Cholelithiasis. 3. No bowel obstruction or active inflammation.  Normal appendix. 4. Scattered colonic diverticula without active inflammatory changes. 5. Coronary vascular calcification and Aortic Atherosclerosis (ICD10-I70.0). Electronically Signed   By: Anner Crete M.D.   On: 07/26/2019 19:08    Procedures Procedures (including critical care time)  Medications Ordered in  ED Medications  levofloxacin (LEVAQUIN) IVPB 750 mg (has no administration in time range)  potassium chloride 10 mEq in 100 mL IVPB (has no administration in time range)  sodium chloride 0.9 % bolus 1,000 mL (0 mLs Intravenous Stopped 07/26/19 1929)  albuterol (VENTOLIN HFA) 108 (90 Base) MCG/ACT inhaler 2 puff (2 puffs Inhalation Given 07/26/19 1920)  dexamethasone (DECADRON) injection 10 mg (10 mg Intravenous Given 07/26/19 1920)  sodium chloride 0.9 % bolus 1,000 mL (1,000 mLs Intravenous New Bag/Given (Non-Interop) 07/26/19 1930)    ED Course  I have reviewed the triage vital signs and the nursing notes.  Pertinent labs & imaging results that were available during my care of the patient were reviewed by me and considered in my medical decision making (see chart for details).    MDM Rules/Calculators/A&P                      WANONA STARE is a 74 y.o. female who presented with abdominal pain, shortness of breath.  Patient is tachycardic and appears dehydrated .Mild epigastric tenderness. Consider PE vs gastroenteritis vs COVID. Will get labs, 6-24 hr COVID, CTA chest, CT ab/pel.   7:49 PM Patient still persistently tachycardic. Her chest x-ray showed a possible pneumonia and her procalcitonin levels are markedly elevated.  She also has acute renal failure as well and CT abdomen pelvis showed no obstruction or hydro.  Her D-dimer is also elevated.  I was unable to get a CTA chest to rule out PE since she has acute renal failure.  I suspected that she likely has Covid and also has pneumonia.  Given IV antibiotics  and hospitalist will admit for presumed COVID, AKI, pneumonia.   Final Clinical Impression(s) / ED Diagnoses Final diagnoses:  None    Rx / DC Orders ED Discharge Orders    None       Drenda Freeze, MD 07/26/19 [id number]

## 2019-07-26 NOTE — ED Notes (Signed)
Portable x-ray at pt bedside ?

## 2019-07-27 DIAGNOSIS — E876 Hypokalemia: Secondary | ICD-10-CM | POA: Diagnosis present

## 2019-07-27 DIAGNOSIS — A419 Sepsis, unspecified organism: Secondary | ICD-10-CM | POA: Diagnosis present

## 2019-07-27 DIAGNOSIS — J189 Pneumonia, unspecified organism: Secondary | ICD-10-CM

## 2019-07-27 DIAGNOSIS — N179 Acute kidney failure, unspecified: Secondary | ICD-10-CM | POA: Diagnosis present

## 2019-07-27 LAB — BLOOD CULTURE ID PANEL (REFLEXED)

## 2019-07-27 LAB — CBC
HCT: 28 % — ABNORMAL LOW (ref 36.0–46.0)
Hemoglobin: 9.3 g/dL — ABNORMAL LOW (ref 12.0–15.0)
MCH: 35.8 pg — ABNORMAL HIGH (ref 26.0–34.0)
MCHC: 33.2 g/dL (ref 30.0–36.0)
MCV: 107.7 fL — ABNORMAL HIGH (ref 80.0–100.0)
Platelets: 90 10*3/uL — ABNORMAL LOW (ref 150–400)
RBC: 2.6 MIL/uL — ABNORMAL LOW (ref 3.87–5.11)
RDW: 14.6 % (ref 11.5–15.5)
WBC: 11.3 10*3/uL — ABNORMAL HIGH (ref 4.0–10.5)
nRBC: 0 % (ref 0.0–0.2)

## 2019-07-27 LAB — COMPREHENSIVE METABOLIC PANEL
ALT: 17 U/L (ref 0–44)
AST: 16 U/L (ref 15–41)
Albumin: 2.8 g/dL — ABNORMAL LOW (ref 3.5–5.0)
Alkaline Phosphatase: 99 U/L (ref 38–126)
Anion gap: 11 (ref 5–15)
BUN: 33 mg/dL — ABNORMAL HIGH (ref 8–23)
CO2: 19 mmol/L — ABNORMAL LOW (ref 22–32)
Calcium: 8 mg/dL — ABNORMAL LOW (ref 8.9–10.3)
Chloride: 107 mmol/L (ref 98–111)
Creatinine, Ser: 2.04 mg/dL — ABNORMAL HIGH (ref 0.44–1.00)
GFR calc Af Amer: 28 mL/min — ABNORMAL LOW (ref >60)
GFR calc non Af Amer: 24 mL/min — ABNORMAL LOW (ref >60)
Glucose, Bld: 129 mg/dL — ABNORMAL HIGH (ref 70–99)
Potassium: 2.8 mmol/L — ABNORMAL LOW (ref 3.5–5.1)
Sodium: 137 mmol/L (ref 135–145)
Total Bilirubin: 1.1 mg/dL (ref 0.3–1.2)
Total Protein: 5.7 g/dL — ABNORMAL LOW (ref 6.5–8.1)

## 2019-07-27 LAB — SARS CORONAVIRUS 2 (TAT 6-24 HRS): SARS Coronavirus 2: NEGATIVE

## 2019-07-27 LAB — HIV ANTIBODY (ROUTINE TESTING W REFLEX): HIV Screen 4th Generation wRfx: NONREACTIVE

## 2019-07-27 MED ORDER — SODIUM CHLORIDE 0.9 % IV SOLN
1.0000 g | Freq: Three times a day (TID) | INTRAVENOUS | Status: DC
  Administered 2019-07-27 – 2019-07-29 (×7): 1 g via INTRAVENOUS
  Filled 2019-07-27 (×9): qty 1

## 2019-07-27 MED ORDER — SODIUM CHLORIDE 0.9 % IV SOLN: INTRAVENOUS | Status: DC

## 2019-07-27 MED ORDER — VENLAFAXINE HCL ER 150 MG PO CP24
150.0000 mg | ORAL_CAPSULE | Freq: Every evening | ORAL | Status: DC
  Administered 2019-07-27 – 2019-07-31 (×5): 150 mg via ORAL
  Filled 2019-07-27 (×2): qty 1
  Filled 2019-07-27: qty 2
  Filled 2019-07-27 (×2): qty 1

## 2019-07-27 MED ORDER — PREDNISONE 5 MG PO TABS
5.0000 mg | ORAL_TABLET | Freq: Every day | ORAL | Status: DC
  Administered 2019-07-27 – 2019-08-01 (×6): 5 mg via ORAL
  Filled 2019-07-27 (×6): qty 1

## 2019-07-27 MED ORDER — PANTOPRAZOLE SODIUM 40 MG PO TBEC
40.0000 mg | DELAYED_RELEASE_TABLET | Freq: Every day | ORAL | Status: DC
  Administered 2019-07-27 – 2019-08-01 (×6): 40 mg via ORAL
  Filled 2019-07-27 (×6): qty 1

## 2019-07-27 MED ORDER — FOLIC ACID 1 MG PO TABS
1.0000 mg | ORAL_TABLET | Freq: Every day | ORAL | Status: DC
  Administered 2019-07-27 – 2019-07-30 (×4): 1 mg via ORAL
  Filled 2019-07-27 (×4): qty 1

## 2019-07-27 MED ORDER — MELATONIN 5 MG PO TABS
5.0000 mg | ORAL_TABLET | Freq: Every day | ORAL | Status: DC
  Administered 2019-07-27 – 2019-07-31 (×5): 5 mg via ORAL
  Filled 2019-07-27 (×6): qty 1

## 2019-07-27 MED ORDER — POTASSIUM CHLORIDE CRYS ER 20 MEQ PO TBCR
40.0000 meq | EXTENDED_RELEASE_TABLET | ORAL | Status: AC
  Administered 2019-07-27 (×3): 40 meq via ORAL
  Filled 2019-07-27 (×3): qty 2

## 2019-07-27 MED ORDER — LEVOFLOXACIN IN D5W 750 MG/150ML IV SOLN: 750.0000 mg | INTRAVENOUS | Status: DC

## 2019-07-27 MED ORDER — ATORVASTATIN CALCIUM 40 MG PO TABS
40.0000 mg | ORAL_TABLET | Freq: Every evening | ORAL | Status: DC
  Administered 2019-07-27 – 2019-07-31 (×5): 40 mg via ORAL
  Filled 2019-07-27 (×5): qty 1

## 2019-07-27 MED ORDER — HYDRALAZINE HCL 25 MG PO TABS
25.0000 mg | ORAL_TABLET | Freq: Four times a day (QID) | ORAL | Status: DC | PRN
  Administered 2019-07-27: 25 mg via ORAL
  Filled 2019-07-27: qty 3

## 2019-07-27 MED ORDER — AMLODIPINE BESYLATE 5 MG PO TABS
5.0000 mg | ORAL_TABLET | Freq: Every evening | ORAL | Status: DC
  Administered 2019-07-27 – 2019-07-28 (×2): 5 mg via ORAL
  Filled 2019-07-27 (×2): qty 1

## 2019-07-27 MED ORDER — ASPIRIN EC 81 MG PO TBEC
81.0000 mg | DELAYED_RELEASE_TABLET | Freq: Every day | ORAL | Status: DC
  Administered 2019-07-27 – 2019-08-01 (×6): 81 mg via ORAL
  Filled 2019-07-27 (×6): qty 1

## 2019-07-27 NOTE — Evaluation (Signed)
Physical Therapy Evaluation Patient Details Name: Alexis Murphy MRN: [id number] DOB: Dec 05, 1945 Today's Date: 07/27/2019   History of Present Illness  74 yo female admitted with Pna, weakness. hx of sarcoidosis, COVID 19, OA  Clinical Impression  Bed level eval (in ED). Pt refused OOB despite encouragement. Discussed d/c plan-she plans to return home where she lives with her son. She stated he can assist as needed. Will follow and progress activity as tolerated. Hr 125 bpm at rest during session.     Follow Up Recommendations Home health PT;Supervision/Assistance - 24 hour    Equipment Recommendations  None recommended by PT    Recommendations for Other Services       Precautions / Restrictions Precautions Precautions: Fall Precaution Comments: monitor HR Restrictions Weight Bearing Restrictions: No      Mobility  Bed Mobility               General bed mobility comments: NT-pt refused OOB despite encouragement  Transfers                    Ambulation/Gait                Stairs            Wheelchair Mobility    Modified Rankin (Stroke Patients Only)       Balance                                             Pertinent Vitals/Pain Pain Assessment: Faces Faces Pain Scale: Hurts little more Pain Location: generalized pain Pain Descriptors / Indicators: Sore;Discomfort Pain Intervention(s): Monitored during session    Home Living Family/patient expects to be discharged to:: Private residence Living Arrangements: Children(adult son) Available Help at Discharge: Family;Available PRN/intermittently Type of Home: House       Home Layout: One level Home Equipment: [place] - 2 wheels;Cane - single point      Prior Function Level of Independence: Independent               Hand Dominance        Extremity/Trunk Assessment   Upper Extremity Assessment Upper Extremity Assessment: Generalized weakness.  Some difficulty with R UE elevation. Tremors?. Defer to OT evaluation    Lower Extremity Assessment Lower Extremity Assessment: Generalized weakness    Cervical / Trunk Assessment Cervical / Trunk Assessment: Normal  Communication   Communication: No difficulties  Cognition Arousal/Alertness: Awake/alert Behavior During Therapy: WFL for tasks assessed/performed Overall Cognitive Status: Within Functional Limits for tasks assessed                                        General Comments      Exercises     Assessment/Plan    PT Assessment Patient needs continued PT services  PT Problem List Decreased strength;Decreased mobility;Decreased activity tolerance;Decreased balance;Decreased knowledge of use of DME       PT Treatment Interventions DME instruction;Gait training;Therapeutic exercise;Therapeutic activities;Patient/family education;Balance training;Functional mobility training    PT Goals (Current goals can be found in the Care Plan section)  Acute Rehab PT Goals Patient Stated Goal: home PT Goal Formulation: With patient Time For Goal Achievement: 08/10/19 Potential to Achieve Goals: Good    Frequency Min 3X/week   Barriers to  discharge        Co-evaluation               AM-PAC PT "6 Clicks" Mobility  Outcome Measure Help needed turning from your back to your side while in a flat bed without using bedrails?: A Little Help needed moving from lying on your back to sitting on the side of a flat bed without using bedrails?: A Little Help needed moving to and from a bed to a chair (including a wheelchair)?: A Little Help needed standing up from a chair using your arms (e.g., wheelchair or bedside chair)?: A Little Help needed to walk in hospital room?: A Lot Help needed climbing 3-5 steps with a railing? : A Lot 6 Click Score: 16    End of Session   Activity Tolerance: Patient tolerated treatment well Patient left: in bed;with call  bell/phone within reach   PT Visit Diagnosis: Muscle weakness (generalized) (M62.81);Difficulty in walking, not elsewhere classified (R26.2)    Time: 6269-4854 PT Time Calculation (min) (ACUTE ONLY): 10 min   Charges:   PT Evaluation $PT Eval Moderate Complexity: 1 Mod             Morry Veiga P, PT Acute Rehabilitation

## 2019-07-27 NOTE — Progress Notes (Signed)
PROGRESS NOTE    Alexis Murphy  BDZ:[id number] DOB: 1945/09/01 DOA: 07/26/2019 PCP: Lawerance Cruel, MD   Brief Narrative:  HPI: Alexis Murphy is a 74 y.o. female with medical history significant of pretension, hyperlipidemia, sarcoidosis and arthritis presented to ED with complaint of severe generalized weakness, shortness of breath and vomiting.  Patient states that previously she was diagnosed with COVID-19 infection when she had similar symptoms in September 2020.  Patient states that shortness of breath is worsening with minimal exertion.  Patient otherwise denies fever, sore throat, loss of taste and smell sensation and contact with any sick person.  Patient states she went to [facility] and was tested negative for COVID-19.  ED Course : In ED on arrival patient had blood pressure of 115/70, heart rate 117, respiratory rate between 18-22.  CBC showed leukocytosis with WBC of 16.9 and hemoglobin 10.3.  Blood work showed also hypokalemia, elevated creatinine to 2.54, lactic acid of 2 and pro calcitonin 134.68.  LDH D-dimer and fibrinogen were elevated.  Chest x-ray showed new reticulonodular opacity in right lung.  Patient was managed with IV fluids, albuterol inhaler and Decadron injection in the ED.  Potassium supplementation was also done.  Assessment & Plan:   Principal Problem:   Pneumonia Active Problems:   Hypokalemia   AKI (acute kidney injury) ([place])   Sepsis ([place])  Sepsis secondary to community-acquired pneumonia: Patient met sepsis criteria based on tachypnea, tachycardia and evidence of right midlung opacity/infiltrates suggesting community-acquired pneumonia as the source of sepsis.  Continue IV Levaquin and albuterol as needed.  She is afebrile and saturating 96% on room air.  AKI on CKD stage III: Last renal function available in chart was from September 2018.  Creatinine 1.26.  Making her CKD stage III.  Presented with creatinine of 2.54.  AKI on CKD stage III.  Creatinine  improving.  2.04 today.  Likely due to sepsis.  Continue IV fluids.  Repeat labs in the morning.  Hypokalemia: 2.8.  Replace orally.  Recheck in the morning.  Essential hypertension: Slightly elevated.  Continue amlodipine.  Add as needed hydralazine.  Generalized weakness: Secondary to sepsis.  Will consult PT.  DVT prophylaxis: Heparin Code Status: Full code Family Communication:  None present at bedside.  Plan of care discussed with patient in length and he verbalized understanding and agreed with it. Disposition Plan: Potential discharge tomorrow.  Estimated body mass index is 28.72 kg/m as calculated from the following:   Height as of this encounter: 5' 1"  (1.549 m).   Weight as of this encounter: 68.9 kg.      Nutritional status:               Consultants:   Heparin  Procedures:   None  Antimicrobials:   IV Levaquin   Subjective: Patient seen and examined.  Breathing feels better than yesterday but is still not back to baseline.  No other complaint.  Objective: Vitals:   07/27/19 0600 07/27/19 0630 07/27/19 0700 07/27/19 0730  BP: (!) 149/103 (!) 152/74 135/80 (!) 142/75  Pulse: (!) 118 (!) 107 (!) 109 (!) 111  Resp: (!) 33 (!) 27 (!) 23 (!) 22  Temp:      TempSrc:      SpO2: 96% 96% 95% 97%  Weight:      Height:        Intake/Output Summary (Last 24 hours) at 07/27/2019 0841 Last data filed at 07/27/2019 0800 Gross per 24 hour  Intake 2455.78  ml  Output --  Net 2455.78 ml   Filed Weights   07/26/19 2324  Weight: 68.9 kg    Examination:  General exam: Appears calm and comfortable  Respiratory system: Rhonchi in middle lobes bilaterally and diminished breath sounds at the bases bilaterally, respiratory effort normal. Cardiovascular system: S1 & S2 heard, RRR. No JVD, murmurs, rubs, gallops or clicks. No pedal edema. Gastrointestinal system: Abdomen is nondistended, soft and nontender. No organomegaly or masses felt. Normal bowel sounds  heard. Central nervous system: Alert and oriented. No focal neurological deficits. Extremities: Symmetric 5 x 5 power. Skin: No rashes, lesions or ulcers Psychiatry: Judgement and insight appear normal. Mood & affect appropriate.    Data Reviewed: I have personally reviewed following labs and imaging studies  CBC: Recent Labs  Lab 07/26/19 1632 07/26/19 1758 07/27/19 0547  WBC 16.9* 15.2* 11.3*  HGB 10.3* 9.7* 9.3*  HCT 29.9* 28.5* 28.0*  MCV 104.2* 106.7* 107.7*  PLT 134* 119* 90*   Basic Metabolic Panel: Recent Labs  Lab 07/26/19 1632 07/26/19 1758 07/27/19 0547  NA 129*  --  137  K 2.8*  --  2.8*  CL 93*  --  107  CO2 22  --  19*  GLUCOSE 114*  --  129*  BUN 40*  --  33*  CREATININE 2.54* 2.55* 2.04*  CALCIUM 8.2*  --  8.0*   GFR: Estimated Creatinine Clearance: 22.1 mL/min (A) (by C-G formula based on SCr of 2.04 mg/dL (H)). Liver Function Tests: Recent Labs  Lab 07/26/19 1632 07/27/19 0547  AST 21 16  ALT 17 17  ALKPHOS 114 99  BILITOT 1.3* 1.1  PROT 6.3* 5.7*  ALBUMIN 3.5 2.8*   Recent Labs  Lab 07/26/19 1632  LIPASE 15   No results for input(s): AMMONIA in the last 168 hours. Coagulation Profile: No results for input(s): INR, PROTIME in the last 168 hours. Cardiac Enzymes: No results for input(s): CKTOTAL, CKMB, CKMBINDEX, TROPONINI in the last 168 hours. BNP (last 3 results) No results for input(s): PROBNP in the last 8760 hours. HbA1C: No results for input(s): HGBA1C in the last 72 hours. CBG: No results for input(s): GLUCAP in the last 168 hours. Lipid Profile: Recent Labs    07/26/19 1719  TRIG 167*   Thyroid Function Tests: No results for input(s): TSH, T4TOTAL, FREET4, T3FREE, THYROIDAB in the last 72 hours. Anemia Panel: Recent Labs    07/26/19 1719  FERRITIN 97   Sepsis Labs: Recent Labs  Lab 07/26/19 1719 07/26/19 2027  PROCALCITON 134.68  --   LATICACIDVEN 2.0* 1.9    Recent Results (from the past 240 hour(s))    SARS CORONAVIRUS 2 (TAT 6-24 HRS) Nasopharyngeal Nasopharyngeal Swab     Status: None   Collection Time: 07/26/19  5:03 PM   Specimen: Nasopharyngeal Swab  Result Value Ref Range Status   SARS Coronavirus 2 NEGATIVE NEGATIVE Final    Comment: (NOTE) SARS-CoV-2 target nucleic acids are NOT DETECTED. The SARS-CoV-2 RNA is generally detectable in upper and lower respiratory specimens during the acute phase of infection. Negative results do not preclude SARS-CoV-2 infection, do not rule out co-infections with other pathogens, and should not be used as the sole basis for treatment or other patient management decisions. Negative results must be combined with clinical observations, patient history, and epidemiological information. The expected result is Negative. Fact Sheet for Patients: SugarRoll.be Fact Sheet for Healthcare Providers: https://www.woods-mathews.com/ This test is not yet approved or cleared by the [place]  FDA and  has been authorized for detection and/or diagnosis of SARS-CoV-2 by FDA under an Emergency Use Authorization (EUA). This EUA will remain  in effect (meaning this test can be used) for the duration of the COVID-19 declaration under Section 56 4(b)(1) of the Act, 21 U.S.C. section 360bbb-3(b)(1), unless the authorization is terminated or revoked sooner. Performed at [facility], [place] [address] [place], [place] [address]       Radiology Studies: DG Chest Port 1 View  Result Murphy: 07/26/2019 CLINICAL DATA:  Shortness of breath, vomiting EXAM: PORTABLE CHEST 1 VIEW COMPARISON:  Radiograph 10/29/2017, CT 07/17/2018 FINDINGS: Slight left anterior obliquity accentuating the cardiomediastinal silhouette and superimposing the mediastinum over the right lung. Coarsened interstitial changes are similar to comparison is. Some new reticulonodular opacity seen in the right mid lung. No pneumothorax. No effusion. The aorta  is calcified. The remaining cardiomediastinal contours are unremarkable accounting for technique and rotation. No acute osseous or soft tissue abnormality. Degenerative changes are present in the imaged spine and shoulders. IMPRESSION: 1. New reticulonodular opacity seen in the right mid lung which may represent pneumonia. Recommend follow-up imaging to resolution. 2. Stable chronic interstitial changes. 3.  Aortic Atherosclerosis (ICD10-I70.0). Electronically Signed   By: Lovena Le M.D.   On: 07/26/2019 17:23   CT Renal Stone Study  Result Murphy: 07/26/2019 CLINICAL DATA:  74 year old female with abdominal discomfort, nausea vomiting. Flank pain. EXAM: CT ABDOMEN AND PELVIS WITHOUT CONTRAST TECHNIQUE: Multidetector CT imaging of the abdomen and pelvis was performed following the standard protocol without IV contrast. COMPARISON:  CT abdomen pelvis dated 07/17/2018. FINDINGS: Evaluation of this exam is limited in the absence of intravenous contrast. Lower chest: The visualized lung bases are clear. Multi vessel coronary vascular calcification noted. No intra-abdominal free air or free fluid. Hepatobiliary: Mild fatty infiltration of the liver. No intrahepatic biliary ductal dilatation. Several stones noted within the gallbladder. No pericholecystic fluid or evidence of acute cholecystitis by CT. Pancreas: Unremarkable. No pancreatic ductal dilatation or surrounding inflammatory changes. Spleen: Normal in size without focal abnormality. Adrenals/Urinary Tract: A 17 mm left adrenal nodule, likely an adenoma. The right adrenal gland is unremarkable. There is no hydronephrosis or nephrolithiasis on either side. A 2 cm right renal inferior pole hypodense lesion is not characterized on this noncontrast CT but appears similar to prior CT. Mild bilateral perinephric stranding, nonspecific. Correlation with urinalysis recommended to evaluate for UTI. The urinary bladder is grossly unremarkable. Stomach/Bowel: There is  a small hiatal hernia. Scattered colonic diverticula without active inflammatory changes. There is no bowel obstruction or active inflammation. The appendix is normal. Vascular/Lymphatic: Advanced aortoiliac atherosclerotic disease. The IVC is unremarkable. No portal venous gas. There is no adenopathy. Reproductive: Hysterectomy.  No adnexal masses. Other: None Musculoskeletal: Osteopenia with multilevel degenerative changes of the spine with disc desiccation and vacuum phenomena. No acute osseous pathology. IMPRESSION: 1. No hydronephrosis or nephrolithiasis. Correlation with urinalysis recommended to evaluate for UTI. 2. Cholelithiasis. 3. No bowel obstruction or active inflammation.  Normal appendix. 4. Scattered colonic diverticula without active inflammatory changes. 5. Coronary vascular calcification and Aortic Atherosclerosis (ICD10-I70.0). Electronically Signed   By: Anner Crete M.D.   On: 07/26/2019 19:08    Scheduled Meds: . heparin  5,000 Units Subcutaneous Q8H  . potassium chloride  40 mEq Oral Q4H   Continuous Infusions: . sodium chloride 100 mL/hr at 07/27/19 0544  . [START ON 07/28/2019] levofloxacin (LEVAQUIN) IV       LOS: 1 day  Time spent: 31 minutes   Darliss Cheney, MD [facility]  07/27/2019, 8:41 AM   To contact the attending provider between 7A-7P or the covering provider during after hours 7P-7A, please log into the web site [email or web address] and use password [email or web address].

## 2019-07-27 NOTE — Plan of Care (Signed)
  Problem: Education: Goal: Knowledge of General Education information will improve Description: Including pain rating scale, medication(s)/side effects and non-pharmacologic comfort measures Outcome: Progressing   Problem: Health Behavior/Discharge Planning: Goal: Ability to manage health-related needs will improve Outcome: Progressing   Problem: Clinical Measurements: Goal: Ability to maintain clinical measurements within normal limits will improve Outcome: Progressing Goal: Will remain free from infection Outcome: Progressing Goal: Diagnostic test results will improve Outcome: Progressing Goal: Respiratory complications will improve Outcome: Progressing Goal: Cardiovascular complication will be avoided Outcome: Progressing   Problem: Activity: Goal: Risk for activity intolerance will decrease Outcome: Progressing   Problem: Nutrition: Goal: Adequate nutrition will be maintained Outcome: Progressing   Problem: Coping: Goal: Level of anxiety will decrease Outcome: Progressing   Problem: Elimination: Goal: Will not experience complications related to urinary retention Outcome: Progressing   Problem: Pain Managment: Goal: General experience of comfort will improve Outcome: Progressing   Problem: Safety: Goal: Ability to remain free from injury will improve Outcome: Progressing   Problem: Skin Integrity: Goal: Risk for impaired skin integrity will decrease Outcome: Progressing   Problem: Activity: Goal: Ability to tolerate increased activity will improve Outcome: Progressing   Problem: Clinical Measurements: Goal: Ability to maintain a body temperature in the normal range will improve Outcome: Progressing   Problem: Respiratory: Goal: Ability to maintain adequate ventilation will improve Outcome: Progressing Goal: Ability to maintain a clear airway will improve Outcome: Progressing

## 2019-07-27 NOTE — Progress Notes (Signed)
07/27/2019  0744 notified Dr Doristine Bosworth of patient's K+ 2.8. Waiting for response. Will continue to monitor.

## 2019-07-27 NOTE — H&P (Signed)
History and Physical    Alexis Murphy CBS:[id number] DOB: 1945/11/28 DOA: 07/26/2019  PCP: Lawerance Cruel, MD (Confirm with patient/family/NH records and if not entered, this has to be entered at [facility] point of entry) Patient coming from: HOME  I have personally briefly reviewed patient's old medical records in [place]  Chief Complaint: Abdominal discomfort, vomiting and generalized weakness.  HPI: Alexis Murphy is a 74 y.o. female with medical history significant of pretension, hyperlipidemia, sarcoidosis and arthritis presented to ED with complaint of severe generalized weakness, shortness of breath and vomiting.  Patient states that previously she was diagnosed with COVID-19 infection when she had similar symptoms in September 2020.  Patient states that shortness of breath is worsening with minimal exertion.  Patient otherwise denies fever, sore throat, loss of taste and smell sensation and contact with any sick person.  Patient states she went to [facility] and was tested negative for COVID-19.  ED Course : In ED on arrival patient had blood pressure of 115/70, heart rate 117, respiratory rate between 18-22.  CBC showed leukocytosis with WBC of 16.9 and hemoglobin 10.3.  Blood work showed also hypokalemia, elevated creatinine to 2.54, lactic acid of 2 and pro calcitonin 134.68.  LDH D-dimer and fibrinogen were elevated.  Chest x-ray showed new reticulonodular opacity in right lung.  Patient was managed with IV fluids, albuterol inhaler and Decadron injection in the ED.  Potassium supplementation was also done.  Review of Systems: As per HPI otherwise 10 point review of systems negative.    Past Medical History:  Diagnosis Date  . Arthritis   . Hyperlipidemia   . Sarcoidosis     Past Surgical History:  Procedure Laterality Date  . ABDOMINAL HYSTERECTOMY    . BREAST BIOPSY    . BREAST SURGERY    . REDUCTION MAMMAPLASTY       reports that she quit smoking about 11 years ago.  Her smoking use included cigarettes. She has a 45.00 pack-year smoking history. She has never used smokeless tobacco. She reports current alcohol use. She reports that she does not use drugs.  Allergies  Allergen Reactions  . Penicillins Anaphylaxis and Rash    Did it involve swelling of the face/tongue/throat, SOB, or low BP? Yes  Did it involve sudden or severe rash/hives, skin peeling, or any reaction on the inside of your mouth or nose? No Did you need to seek medical attention at a hospital or doctor's office? No When did it last happen? Childhood If all above answers are "NO", may proceed with cephalosporin use.   . Codeine Other (See Comments)    Vomiting     Family History  Problem Relation Age of Onset  . Heart disease Father   . Heart disease Brother      Prior to Admission medications   Medication Sig Start Date End Date Taking? Authorizing Provider  acetaminophen (TYLENOL) 325 MG tablet Take 1 tablet (325 mg total) by mouth every 6 (six) hours. Patient taking differently: Take 325 mg by mouth every 6 (six) hours as needed for headache.  03/10/17  Yes Caccavale, Sophia, PA-C  amLODipine (NORVASC) 5 MG tablet Take 5 mg by mouth every evening.  08/27/16  Yes [provider]  aspirin EC 81 MG tablet Take 81 mg by mouth daily.   Yes [provider]  atorvastatin (LIPITOR) 40 MG tablet Take 40 mg by mouth every evening.  08/27/16  Yes [provider]  folic acid (FOLVITE)  1 MG tablet Take 1 mg by mouth daily.   Yes [provider]  hydroxyurea (HYDREA) 500 MG capsule Take 1 capsule (500 mg total) by mouth daily. May take with food to minimize GI side effects. Patient taking differently: Take 500 mg by mouth once a week. May take with food to minimize GI side effects. 05/19/19  Yes Brunetta Genera, MD  Melatonin 5 MG TABS Take 5 mg by mouth at bedtime.   Yes [provider]  methotrexate (RHEUMATREX) 2.5 MG tablet Take 15 mg by  mouth once a week.  05/01/18  Yes [provider]  omeprazole (PRILOSEC) 40 MG capsule Take 40 mg by mouth every evening.    Yes [provider]  predniSONE (DELTASONE) 5 MG tablet Take 5 mg by mouth daily.   Yes [provider]  venlafaxine XR (EFFEXOR-XR) 150 MG 24 hr capsule Take 150 mg by mouth every evening.  08/27/16  Yes [provider]    Physical Exam: Vitals:   07/26/19 2324 07/27/19 0000 07/27/19 0030 07/27/19 0100  BP:  130/68 129/69 125/72  Pulse:  (!) 105 (!) 105 (!) 104  Resp:  (!) 28 (!) 28 (!) 29  Temp:      TempSrc:      SpO2:  96% 96% 96%  Weight: 68.9 kg     Height: 5\' 1"  (1.549 m)       Constitutional: NAD, calm, comfortable Vitals:   07/26/19 2324 07/27/19 0000 07/27/19 0030 07/27/19 0100  BP:  130/68 129/69 125/72  Pulse:  (!) 105 (!) 105 (!) 104  Resp:  (!) 28 (!) 28 (!) 29  Temp:      TempSrc:      SpO2:  96% 96% 96%  Weight: 68.9 kg     Height: 5\' 1"  (1.549 m)       General: Patient is a pleasant 74 year old female on 2 L of oxygen with nasal cannula and in no acute distress. Eyes: PERRL, lids and conjunctivae normal ENMT: Mucous membranes are moist. Posterior pharynx clear of any exudate or lesions.Normal dentition.  Neck: normal, supple, no masses, no thyromegaly Respiratory: Patient on 2 L of oxygen with nasal cannula.  Diminished breath sounds in right lung along with mild wheezing but no crackles on auscultation. Normal respiratory effort. No accessory muscle use.  Cardiovascular: Regular rate and rhythm, no murmurs / rubs / gallops. No extremity edema. 2+ pedal pulses. No carotid bruits.  Abdomen: no tenderness, no masses palpated. No hepatosplenomegaly. Bowel sounds positive.  Musculoskeletal: no clubbing / cyanosis. No joint deformity upper and lower extremities. Good ROM, no contractures. Normal muscle tone.  Skin: no rashes, lesions, ulcers. No induration Neurologic: CN 2-12 grossly intact. Sensation  intact, DTR normal. Strength 5/5 in all 4.  Psychiatric: Normal judgment and insight. Alert and oriented x 3. Normal mood.    Labs on Admission: I have personally reviewed following labs and imaging studies  CBC: Recent Labs  Lab 07/26/19 1632 07/26/19 1758  WBC 16.9* 15.2*  HGB 10.3* 9.7*  HCT 29.9* 28.5*  MCV 104.2* 106.7*  PLT 134* 546*   Basic Metabolic Panel: Recent Labs  Lab 07/26/19 1632 07/26/19 1758  NA 129*  --   K 2.8*  --   CL 93*  --   CO2 22  --   GLUCOSE 114*  --   BUN 40*  --   CREATININE 2.54* 2.55*  CALCIUM 8.2*  --    GFR: Estimated Creatinine Clearance:  17.7 mL/min (A) (by C-G formula based on SCr of 2.55 mg/dL (H)). Liver Function Tests: Recent Labs  Lab 07/26/19 1632  AST 21  ALT 17  ALKPHOS 114  BILITOT 1.3*  PROT 6.3*  ALBUMIN 3.5   Recent Labs  Lab 07/26/19 1632  LIPASE 15   No results for input(s): AMMONIA in the last 168 hours. Coagulation Profile: No results for input(s): INR, PROTIME in the last 168 hours. Cardiac Enzymes: No results for input(s): CKTOTAL, CKMB, CKMBINDEX, TROPONINI in the last 168 hours. BNP (last 3 results) No results for input(s): PROBNP in the last 8760 hours. HbA1C: No results for input(s): HGBA1C in the last 72 hours. CBG: No results for input(s): GLUCAP in the last 168 hours. Lipid Profile: Recent Labs    07/26/19 1719  TRIG 167*   Thyroid Function Tests: No results for input(s): TSH, T4TOTAL, FREET4, T3FREE, THYROIDAB in the last 72 hours. Anemia Panel: Recent Labs    07/26/19 1719  FERRITIN 97   Urine analysis:    Component Value Date/Time   COLORURINE YELLOW 07/26/2019 2027   APPEARANCEUR CLEAR 07/26/2019 2027   LABSPEC 1.005 07/26/2019 2027   PHURINE 7.0 07/26/2019 2027   GLUCOSEU NEGATIVE 07/26/2019 2027   HGBUR MODERATE (A) 07/26/2019 2027   BILIRUBINUR NEGATIVE 07/26/2019 2027   KETONESUR NEGATIVE 07/26/2019 2027   PROTEINUR 30 (A) 07/26/2019 2027   NITRITE NEGATIVE  07/26/2019 2027   LEUKOCYTESUR TRACE (A) 07/26/2019 2027    Radiological Exams on Admission: DG Chest Port 1 View  Result Date: 07/26/2019 CLINICAL DATA:  Shortness of breath, vomiting EXAM: PORTABLE CHEST 1 VIEW COMPARISON:  Radiograph 10/29/2017, CT 07/17/2018 FINDINGS: Slight left anterior obliquity accentuating the cardiomediastinal silhouette and superimposing the mediastinum over the right lung. Coarsened interstitial changes are similar to comparison is. Some new reticulonodular opacity seen in the right mid lung. No pneumothorax. No effusion. The aorta is calcified. The remaining cardiomediastinal contours are unremarkable accounting for technique and rotation. No acute osseous or soft tissue abnormality. Degenerative changes are present in the imaged spine and shoulders. IMPRESSION: 1. New reticulonodular opacity seen in the right mid lung which may represent pneumonia. Recommend follow-up imaging to resolution. 2. Stable chronic interstitial changes. 3.  Aortic Atherosclerosis (ICD10-I70.0). Electronically Signed   By: Lovena Le M.D.   On: 07/26/2019 17:23   CT Renal Stone Study  Result Date: 07/26/2019 CLINICAL DATA:  74 year old female with abdominal discomfort, nausea vomiting. Flank pain. EXAM: CT ABDOMEN AND PELVIS WITHOUT CONTRAST TECHNIQUE: Multidetector CT imaging of the abdomen and pelvis was performed following the standard protocol without IV contrast. COMPARISON:  CT abdomen pelvis dated 07/17/2018. FINDINGS: Evaluation of this exam is limited in the absence of intravenous contrast. Lower chest: The visualized lung bases are clear. Multi vessel coronary vascular calcification noted. No intra-abdominal free air or free fluid. Hepatobiliary: Mild fatty infiltration of the liver. No intrahepatic biliary ductal dilatation. Several stones noted within the gallbladder. No pericholecystic fluid or evidence of acute cholecystitis by CT. Pancreas: Unremarkable. No pancreatic ductal  dilatation or surrounding inflammatory changes. Spleen: Normal in size without focal abnormality. Adrenals/Urinary Tract: A 17 mm left adrenal nodule, likely an adenoma. The right adrenal gland is unremarkable. There is no hydronephrosis or nephrolithiasis on either side. A 2 cm right renal inferior pole hypodense lesion is not characterized on this noncontrast CT but appears similar to prior CT. Mild bilateral perinephric stranding, nonspecific. Correlation with urinalysis recommended to evaluate for UTI. The urinary bladder is grossly unremarkable.  Stomach/Bowel: There is a small hiatal hernia. Scattered colonic diverticula without active inflammatory changes. There is no bowel obstruction or active inflammation. The appendix is normal. Vascular/Lymphatic: Advanced aortoiliac atherosclerotic disease. The IVC is unremarkable. No portal venous gas. There is no adenopathy. Reproductive: Hysterectomy.  No adnexal masses. Other: None Musculoskeletal: Osteopenia with multilevel degenerative changes of the spine with disc desiccation and vacuum phenomena. No acute osseous pathology. IMPRESSION: 1. No hydronephrosis or nephrolithiasis. Correlation with urinalysis recommended to evaluate for UTI. 2. Cholelithiasis. 3. No bowel obstruction or active inflammation.  Normal appendix. 4. Scattered colonic diverticula without active inflammatory changes. 5. Coronary vascular calcification and Aortic Atherosclerosis (ICD10-I70.0). Electronically Signed   By: Anner Crete M.D.   On: 07/26/2019 19:08      Assessment/Plan Principal Problem:   Pneumonia Continue oxygen supplementation with nasal cannula to treat the acute hypoxic respiratory failure. IV levofloxacin ordered Albuterol inhaler as needed  Active Problems:   Hypokalemia Potassium repletion done in the ED Repeat BMP and potassium repletion will be done as needed.    AKI (acute kidney injury) [facility]) Patient is having acute kidney injury most likely  secondary to sepsis. IV fluids given according to the sepsis protocol in the ED. Continue gentle hydration with IV normal saline at the rate of 100 mL/h Continue to monitor creatinine level    Sepsis (HCC) IV fluids given in the ED and IV levofloxacin started     DVT prophylaxis: Heparin Code Status: Full code  Consults called: Admission status: Inpatient/MedSurg   Edmonia Lynch MD [facility] Pager [phone]  If 7PM-7AM, please contact night-coverage [email or web address] Password   07/27/2019, 5:05 AM

## 2019-07-27 NOTE — Progress Notes (Signed)
Pharmacy Antibiotic Note  Alexis Murphy is a 74 y.o. female admitted on 07/26/2019 with pneumonia.  Pharmacy has been consulted for Levofloxacin dosing.  Plan: Levofloxacin 750mg  iv q48hr  Levofloxacin dosed based on patient weight and renal function     Height: 5\' 1"  (154.9 cm) Weight: 152 lb (68.9 kg) IBW/kg (Calculated) : 47.8  Temp (24hrs), Avg:98.9 F (37.2 C), Min:98.9 F (37.2 C), Max:98.9 F (37.2 C)  Recent Labs  Lab 07/26/19 1632 07/26/19 1719 07/26/19 1758 07/26/19 2027  WBC 16.9*  --  15.2*  --   CREATININE 2.54*  --  2.55*  --   LATICACIDVEN  --  2.0*  --  1.9    Estimated Creatinine Clearance: 17.7 mL/min (A) (by C-G formula based on SCr of 2.55 mg/dL (H)).    Allergies  Allergen Reactions  . Penicillins Anaphylaxis and Rash    Did it involve swelling of the face/tongue/throat, SOB, or low BP? Yes  Did it involve sudden or severe rash/hives, skin peeling, or any reaction on the inside of your mouth or nose? No Did you need to seek medical attention at a hospital or doctor's office? No When did it last happen? Childhood If all above answers are "NO", may proceed with cephalosporin use.   . Codeine Other (See Comments)    Vomiting     Antimicrobials this admission: Levofloxacin 07/26/2019 >>  Dose adjustments this admission: -  Microbiology results: -  Thank you for allowing pharmacy to be a part of this patient's care.  Alexis Murphy 07/27/2019 3:56 AM

## 2019-07-27 NOTE — Progress Notes (Signed)
   Vital Signs MEWS/VS Documentation      07/27/2019 1531 07/27/2019 1600 07/27/2019 1701 07/27/2019 2019   MEWS Score:  2  4  3  2    MEWS Score Color:  Yellow  Red  Yellow  Yellow   Resp:  20  (!) 31  (!) 23  16   Pulse:  (!) 120  (!) 112  (!) 117  (!) 124   BP:  (!) 161/76  (!) 134/95  (!) 177/89  (!) 153/84   Temp:  --  --  --  98.3 F (36.8 C)   O2 Device:  --  --  --  Room Air        No new change hydrazine 25 mg given at at ED. Pt a/o no distress. Transferring to tele bed as ordered.       Melrose Nakayama 07/27/2019,10:13 PM

## 2019-07-27 NOTE — Progress Notes (Signed)
PHARMACY - PHYSICIAN COMMUNICATION CRITICAL VALUE ALERT - BLOOD CULTURE IDENTIFICATION (BCID)  Alexis Murphy is an 74 y.o. female who presented to [facility] on 07/26/2019 with a chief complaint of severe weakness, SOB, vomiting.  Assessment: BCID + 4/4 GNR, E.coli (no resistance detected).   Name of physician (or Provider) Contacted: Dr. Doristine Bosworth  Current antibiotics: Levofloxacin  Changes to prescribed antibiotics: Antibiotics changed to aztreonam. Dose 1 g IV q8h (50% reduction for CrCl 10-30 mL/min). Pt has severe PCN allergy (anaphylaxis) with no record of receiving cephalosporins in Epic.   Results for orders placed or performed during the hospital encounter of 07/26/19  Blood Culture ID Panel (Reflexed) (Collected: 07/26/2019  5:19 PM)  Result Value Ref Range   Enterococcus species NOT DETECTED NOT DETECTED   Listeria monocytogenes NOT DETECTED NOT DETECTED   Staphylococcus species NOT DETECTED NOT DETECTED   Staphylococcus aureus (BCID) NOT DETECTED NOT DETECTED   Streptococcus species NOT DETECTED NOT DETECTED   Streptococcus agalactiae NOT DETECTED NOT DETECTED   Streptococcus pneumoniae NOT DETECTED NOT DETECTED   Streptococcus pyogenes NOT DETECTED NOT DETECTED   Acinetobacter baumannii NOT DETECTED NOT DETECTED   Enterobacteriaceae species DETECTED (A) NOT DETECTED   Enterobacter cloacae complex NOT DETECTED NOT DETECTED   Escherichia coli DETECTED (A) NOT DETECTED   Klebsiella oxytoca NOT DETECTED NOT DETECTED   Klebsiella pneumoniae NOT DETECTED NOT DETECTED   Proteus species NOT DETECTED NOT DETECTED   Serratia marcescens NOT DETECTED NOT DETECTED   Carbapenem resistance NOT DETECTED NOT DETECTED   Haemophilus influenzae NOT DETECTED NOT DETECTED   Neisseria meningitidis NOT DETECTED NOT DETECTED   Pseudomonas aeruginosa NOT DETECTED NOT DETECTED   Candida albicans NOT DETECTED NOT DETECTED   Candida glabrata NOT DETECTED NOT DETECTED   Candida krusei NOT  DETECTED NOT DETECTED   Candida parapsilosis NOT DETECTED NOT DETECTED   Candida tropicalis NOT DETECTED NOT [place], PharmD 07/27/2019  3:27 PM

## 2019-07-28 DIAGNOSIS — E876 Hypokalemia: Secondary | ICD-10-CM

## 2019-07-28 LAB — CBC WITH DIFFERENTIAL/PLATELET
Abs Immature Granulocytes: 0.43 10*3/uL — ABNORMAL HIGH (ref 0.00–0.07)
Basophils Absolute: 0.1 10*3/uL (ref 0.0–0.1)
Basophils Relative: 1 %
Eosinophils Absolute: 0 10*3/uL (ref 0.0–0.5)
Eosinophils Relative: 0 %
HCT: 28.2 % — ABNORMAL LOW (ref 36.0–46.0)
Hemoglobin: 9.2 g/dL — ABNORMAL LOW (ref 12.0–15.0)
Immature Granulocytes: 6 %
Lymphocytes Relative: 2 %
Lymphs Abs: 0.2 10*3/uL — ABNORMAL LOW (ref 0.7–4.0)
MCH: 36.1 pg — ABNORMAL HIGH (ref 26.0–34.0)
MCHC: 32.6 g/dL (ref 30.0–36.0)
MCV: 110.6 fL — ABNORMAL HIGH (ref 80.0–100.0)
Monocytes Absolute: 0.2 10*3/uL (ref 0.1–1.0)
Monocytes Relative: 3 %
Neutro Abs: 6.4 10*3/uL (ref 1.7–7.7)
Neutrophils Relative %: 88 %
Platelets: 84 10*3/uL — ABNORMAL LOW (ref 150–400)
RBC: 2.55 MIL/uL — ABNORMAL LOW (ref 3.87–5.11)
RDW: 15.1 % (ref 11.5–15.5)
WBC: 7.3 10*3/uL (ref 4.0–10.5)
nRBC: 0 % (ref 0.0–0.2)

## 2019-07-28 LAB — BASIC METABOLIC PANEL
Anion gap: 9 (ref 5–15)
BUN: 34 mg/dL — ABNORMAL HIGH (ref 8–23)
CO2: 17 mmol/L — ABNORMAL LOW (ref 22–32)
Calcium: 8.2 mg/dL — ABNORMAL LOW (ref 8.9–10.3)
Chloride: 115 mmol/L — ABNORMAL HIGH (ref 98–111)
Creatinine, Ser: 1.67 mg/dL — ABNORMAL HIGH (ref 0.44–1.00)
GFR calc Af Amer: 35 mL/min — ABNORMAL LOW (ref >60)
GFR calc non Af Amer: 30 mL/min — ABNORMAL LOW (ref >60)
Glucose, Bld: 111 mg/dL — ABNORMAL HIGH (ref 70–99)
Potassium: 3.7 mmol/L (ref 3.5–5.1)
Sodium: 141 mmol/L (ref 135–145)

## 2019-07-28 NOTE — Evaluation (Signed)
Occupational Therapy Evaluation Patient Details Name: Alexis Murphy MRN: [id number] DOB: 1946-01-16 Today's Date: 07/28/2019    History of Present Illness 74 yo female admitted with Pna, weakness. hx of sarcoidosis, COVID 28 March 2019, OA   Clinical Impression   Pt with decline in function and safety with ADLs and ADL mobility with impaired strength, balance and endurance. Pt lives at home with her son and was independent with ADLs/selfcare and used a cane for mobility; son does the cooking. Pt currently requires min guard A with bed mobility, set up with UB ADLs, min A with LB ADLs, min guard A with toileting and min - min guard A with mobility. Pt's HR 103 at rest. Pt fatigues easily and required multiple rest breaks. Pt would benefit from acute OT services to address impairments to maximize level of function and safety    Follow Up Recommendations  Home health OT    Equipment Recommendations  Tub/shower seat;3 in 1 bedside commode    Recommendations for Other Services       Precautions / Restrictions Precautions Precautions: Fall Precaution Comments: monitor HR Restrictions Weight Bearing Restrictions: No      Mobility Bed Mobility Overal bed mobility: Needs Assistance Bed Mobility: Supine to Sit;Sit to Supine     Supine to sit: Min guard Sit to supine: Min guard   General bed mobility comments: min guard assist with use of rails, no physical assist  Transfers Overall transfer level: Needs assistance Equipment used: Rolling walker (2 wheeled) Transfers: Sit to/from [facility] Sit to Stand: Min assist Stand pivot transfers: Min guard       General transfer comment: cues for safety    Balance Overall balance assessment: Needs assistance Sitting-balance support: Feet unsupported Sitting balance-Leahy Scale: Good     Standing balance support: Bilateral upper extremity supported;During functional activity Standing balance-Leahy Scale:  Poor                             ADL either performed or assessed with clinical judgement   ADL Overall ADL's : Needs assistance/impaired Eating/Feeding: Independent;Sitting   Grooming: Wash/dry hands;Wash/dry face;Min guard;Standing   Upper Body Bathing: Set up;Independent;Sitting   Lower Body Bathing: Minimal assistance   Upper Body Dressing : Set up;Independent;Sitting   Lower Body Dressing: Minimal assistance   Toilet Transfer: Minimal assistance;Set up;Stand-pivot;Cueing for safety   Toileting- Clothing Manipulation and Hygiene: Min guard;Sit to/from stand       Functional mobility during ADLs: Minimal assistance;Min guard;Cueing for safety       Vision Baseline Vision/History: Wears glasses Patient Visual Report: No change from baseline       Perception     Praxis      Pertinent Vitals/Pain Pain Assessment: No/denies pain Pain Score: 0-No pain Pain Intervention(s): Monitored during session;Repositioned     Hand Dominance Right   Extremity/Trunk Assessment Upper Extremity Assessment Upper Extremity Assessment: Generalized weakness   Lower Extremity Assessment Lower Extremity Assessment: Defer to PT evaluation   Cervical / Trunk Assessment Cervical / Trunk Assessment: Normal   Communication Communication Communication: No difficulties   Cognition Arousal/Alertness: Awake/alert Behavior During Therapy: WFL for tasks assessed/performed Overall Cognitive Status: Within Functional Limits for tasks assessed                                 General Comments: pt very talkative   General Comments  Exercises     Shoulder Instructions      Home Living Family/patient expects to be discharged to:: Private residence Living Arrangements: Children Available Help at Discharge: Family;Available PRN/intermittently Type of Home: House       Home Layout: One level     Bathroom Shower/Tub: Radiographer, therapeutic: Standard     Home Equipment: Environmental consultant - 2 wheels;Cane - single point          Prior Functioning/Environment Level of Independence: Independent                 OT Problem List: Decreased strength;Impaired balance (sitting and/or standing);Decreased activity tolerance;Decreased knowledge of use of DME or AE      OT Treatment/Interventions: Self-care/ADL training;DME and/or AE instruction;Therapeutic activities;Therapeutic exercise;Patient/family education    OT Goals(Current goals can be found in the care plan section) Acute Rehab OT Goals Patient Stated Goal: home OT Goal Formulation: With patient Time For Goal Achievement: 08/11/19 Potential to Achieve Goals: Good ADL Goals Pt Will Perform Grooming: with supervision;with set-up;standing Pt Will Perform Lower Body Bathing: with min guard assist;with supervision;with set-up;sit to/from stand Pt Will Perform Lower Body Dressing: with min guard assist;with supervision;with set-up;sit to/from stand Pt Will Transfer to Toilet: with min guard assist;with supervision;ambulating Pt Will Perform Toileting - Clothing Manipulation and hygiene: with supervision;with modified independence;sit to/from stand Additional ADL Goal #1: Pt will complete bed mobility with sup to sit EOB in prep for functional tasks  OT Frequency: Min 2X/week   Barriers to D/C:    no barriers       Co-evaluation              AM-PAC OT "6 Clicks" Daily Activity     Outcome Measure Help from another person eating meals?: None Help from another person taking care of personal grooming?: A Little Help from another person toileting, which includes using toliet, bedpan, or urinal?: A Little Help from another person bathing (including washing, rinsing, drying)?: A Little Help from another person to put on and taking off regular upper body clothing?: None Help from another person to put on and taking off regular lower body clothing?: A Little 6 Click  Score: 20   End of Session Equipment Utilized During Treatment: Gait belt  Activity Tolerance: Patient tolerated treatment well Patient left: in bed;with call bell/phone within reach  OT Visit Diagnosis: Unsteadiness on feet (R26.81);Muscle weakness (generalized) (M62.81)                Time: 4401-0272 OT Time Calculation (min): 25 min Charges:  OT General Charges $OT Visit: 1 Visit OT Evaluation $OT Eval Moderate Complexity: 1 Mod OT Treatments $Self Care/Home Management : 8-22 mins    Emmit Alexanders [facility] 07/28/2019, 1:07 PM

## 2019-07-28 NOTE — Progress Notes (Signed)
PROGRESS NOTE    Alexis Murphy  EHO:[id number] DOB: [birth year] DOA: 07/26/2019 PCP: Lawerance Cruel, MD    Brief Narrative:  74 y.o.femalewith medical history significant ofpretension, hyperlipidemia, sarcoidosis and arthritis presented to ED with complaint of severe generalized weakness, shortness of breath and vomiting. Patient states that previously she was diagnosed with COVID-19 infection when she had similar symptoms in September 2020. Patient states that shortness of breath is worsening with minimal exertion. Patient otherwise denies fever, sore throat, loss of taste and smell sensation and contact with any sick person. Patient states [facility] and was tested negative for [facility]-19.  ED Course :In ED on arrival patient had blood pressure of 115/70, heart rate 117, respiratory rate between 18-22. CBC showed leukocytosis with WBC of 16.9 and hemoglobin 10.3. Blood work showed also hypokalemia, elevated creatinine to 2.54, lactic acid of 2 and pro calcitonin 134.68. LDH D-dimer and fibrinogen were elevated. Chest x-ray showed new reticulonodular opacity in right lung. Patient was managed with IV fluids, albuterol inhaler and Decadron injection in the ED. Potassium supplementation was also done. Assessment & Plan:   Principal Problem:   Pneumonia Active Problems:   Hypokalemia   AKI (acute kidney injury) ([place])   Sepsis ([place])   Sepsis secondary to community-acquired pneumonia and ecoli bacteremia present on admit: Patient met sepsis criteria based on tachypnea, tachycardia and evidence of right midlung opacity/infiltrates suggesting community-acquired pneumonia as the source of sepsis. Pt also noted to have ecoli bacteremia. Continued on meropenem with clinical improvement. Still tachycardic. Continue current abx. Follow sensitivities  AKI on CKD stage III: Last renal function available in chart was from September 2018.  Creatinine 1.26.  Making her CKD stage III.   Presented with creatinine of 2.54.  AKI on CKD stage III.  Cr improving with IVF. Will repeat bmet in AM  Hypokalemia: replaced. Repeat bmet in AM  Essential hypertension: Continue amlodipine. Suboptimally controlled at this time. Continue with PRN hydralazine.  Generalized weakness: Secondary to presenting sepsis.  Seen by PT with recommendations for HHPT  DVT prophylaxis: Heparin subq Code Status: Full Family Communication: Pt in room, family not at bedside Disposition Plan: home with home health when hemodynamics improved and no longer tachycardic. Also pending sensitivities  Consultants:     Procedures:     Antimicrobials: Anti-infectives (From admission, onward)   Start     Dose/Rate Route Frequency Ordered Stop   07/28/19 2200  levofloxacin (LEVAQUIN) IVPB 750 mg  Status:  Discontinued     750 mg 100 mL/hr over 90 Minutes Intravenous Every 48 hours 07/27/19 0356 07/27/19 1531   07/27/19 1600  aztreonam (AZACTAM) 1 g in sodium chloride 0.9 % 100 mL IVPB     1 g 200 mL/hr over 30 Minutes Intravenous Every 8 hours 07/27/19 1532     07/26/19 1930  levofloxacin (LEVAQUIN) IVPB 750 mg     750 mg 100 mL/hr over 90 Minutes Intravenous  Once 07/26/19 1928 07/26/19 2311       Subjective: Feeling better. Denies palpitations despite elevated HR  Objective: Vitals:   07/27/19 2227 07/27/19 2232 07/28/19 0541 07/28/19 1318  BP:  (!) 160/78 (!) 152/79 (!) 161/98  Pulse:  (!) 117 (!) 103 (!) 116  Resp:  16 20 20   Temp:  98.3 F (36.8 C) 98.1 F (36.7 C) (!) 97.5 F (36.4 C)  TempSrc:  Oral Oral Oral  SpO2:  97% 95% 100%  Weight: 76.9 kg     Height:  5' 1"  (1.549 m)       Intake/Output Summary (Last 24 hours) at 07/28/2019 1606 Last data filed at 07/28/2019 1507 Gross per 24 hour  Intake 2927.05 ml  Output 1550 ml  Net 1377.05 ml   Filed Weights   07/26/19 2324 07/27/19 2227  Weight: 68.9 kg 76.9 kg    Examination:  General exam: Appears calm and comfortable   Respiratory system: Clear to auscultation. Respiratory effort normal. Cardiovascular system: S1 & S2 heard, tachycardic Gastrointestinal system: Abdomen is nondistended, soft and nontender. No organomegaly or masses felt. Normal bowel sounds heard. Central nervous system: Alert and oriented. No focal neurological deficits. Extremities: Symmetric 5 x 5 power. Skin: No rashes, lesions Psychiatry: Judgement and insight appear normal. Mood & affect appropriate.   Data Reviewed: I have personally reviewed following labs and imaging studies  CBC: Recent Labs  Lab 07/26/19 1632 07/26/19 1758 07/27/19 0547 07/28/19 0433  WBC 16.9* 15.2* 11.3* 7.3  NEUTROABS  --   --   --  6.4  HGB 10.3* 9.7* 9.3* 9.2*  HCT 29.9* 28.5* 28.0* 28.2*  MCV 104.2* 106.7* 107.7* 110.6*  PLT 134* 119* 90* 84*   Basic Metabolic Panel: Recent Labs  Lab 07/26/19 1632 07/26/19 1758 07/27/19 0547 07/28/19 0433  NA 129*  --  137 141  K 2.8*  --  2.8* 3.7  CL 93*  --  107 115*  CO2 22  --  19* 17*  GLUCOSE 114*  --  129* 111*  BUN 40*  --  33* 34*  CREATININE 2.54* 2.55* 2.04* 1.67*  CALCIUM 8.2*  --  8.0* 8.2*   GFR: Estimated Creatinine Clearance: 28.6 mL/min (A) (by C-G formula based on SCr of 1.67 mg/dL (H)). Liver Function Tests: Recent Labs  Lab 07/26/19 1632 07/27/19 0547  AST 21 16  ALT 17 17  ALKPHOS 114 99  BILITOT 1.3* 1.1  PROT 6.3* 5.7*  ALBUMIN 3.5 2.8*   Recent Labs  Lab 07/26/19 1632  LIPASE 15   No results for input(s): AMMONIA in the last 168 hours. Coagulation Profile: No results for input(s): INR, PROTIME in the last 168 hours. Cardiac Enzymes: No results for input(s): CKTOTAL, CKMB, CKMBINDEX, TROPONINI in the last 168 hours. BNP (last 3 results) No results for input(s): PROBNP in the last 8760 hours. HbA1C: No results for input(s): HGBA1C in the last 72 hours. CBG: No results for input(s): GLUCAP in the last 168 hours. Lipid Profile: Recent Labs     07/26/19 1719  TRIG 167*   Thyroid Function Tests: No results for input(s): TSH, T4TOTAL, FREET4, T3FREE, THYROIDAB in the last 72 hours. Anemia Panel: Recent Labs    07/26/19 1719  FERRITIN 97   Sepsis Labs: Recent Labs  Lab 07/26/19 1719 07/26/19 2027  PROCALCITON 134.68  --   LATICACIDVEN 2.0* 1.9    Recent Results (from the past 240 hour(s))  SARS CORONAVIRUS 2 (TAT 6-24 HRS) Nasopharyngeal Nasopharyngeal Swab     Status: None   Collection Time: 07/26/19  5:03 PM   Specimen: Nasopharyngeal Swab  Result Value Ref Range Status   SARS Coronavirus 2 NEGATIVE NEGATIVE Final    Comment: (NOTE) SARS-CoV-2 target nucleic acids are NOT DETECTED. The SARS-CoV-2 RNA is generally detectable in upper and lower respiratory specimens during the acute phase of infection. Negative results do not preclude SARS-CoV-2 infection, do not rule out co-infections with other pathogens, and should not be used as the sole basis for treatment or other patient management decisions.  Negative results must be combined with clinical observations, patient history, and epidemiological information. The expected result is Negative. Fact Sheet for Patients: SugarRoll.be Fact Sheet for Healthcare Providers: https://www.woods-mathews.com/ This test is not yet approved or cleared by the [place] FDA and  has been authorized for detection and/or diagnosis of SARS-CoV-2 by FDA under an Emergency Use Authorization (EUA). This EUA will remain  in effect (meaning this test can be used) for the duration of the COVID-19 declaration under Section 56 4(b)(1) of the Act, 21 U.S.C. section 360bbb-3(b)(1), unless the authorization is terminated or revoked sooner. Performed at [facility], [place] [address], [place], [place] [address]   Blood Culture (routine x 2)     Status: Abnormal (Preliminary result)   Collection Time: 07/26/19  5:19 PM   Specimen: BLOOD LEFT  HAND  Result Value Ref Range Status   Specimen Description   Final    BLOOD LEFT HAND Performed at [facility] [address], [place], [place] [address]    Special Requests   Final    BOTTLES DRAWN AEROBIC AND ANAEROBIC Blood Culture adequate volume Performed at [facility] [address] [place], [place] [address]    Culture  Setup Time   Final    GRAM NEGATIVE RODS IN BOTH AEROBIC AND ANAEROBIC BOTTLES CRITICAL RESULT CALLED TO, READ BACK BY AND VERIFIED WITH: Shelda Jakes PharmD 15:25 07/27/19 ([email or web address])    Culture (A)  Final    ESCHERICHIA COLI SUSCEPTIBILITIES TO FOLLOW Performed at [facility], [place] [address] [place],  [address]    Report Status PENDING  Incomplete  Blood Culture ID Panel (Reflexed)     Status: Abnormal   Collection Time: 07/26/19  5:19 PM  Result Value Ref Range Status   Enterococcus species NOT DETECTED NOT DETECTED Final   Listeria monocytogenes NOT DETECTED NOT DETECTED Final   Staphylococcus species NOT DETECTED NOT DETECTED Final   Staphylococcus aureus (BCID) NOT DETECTED NOT DETECTED Final   Streptococcus species NOT DETECTED NOT DETECTED Final   Streptococcus agalactiae NOT DETECTED NOT DETECTED Final   Streptococcus pneumoniae NOT DETECTED NOT DETECTED Final   Streptococcus pyogenes NOT DETECTED NOT DETECTED Final   Acinetobacter baumannii NOT DETECTED NOT DETECTED Final   Enterobacteriaceae species DETECTED (A) NOT DETECTED Final    Comment: Enterobacteriaceae represent a large family of gram-negative bacteria, not a single organism. CRITICAL RESULT CALLED TO, READ BACK BY AND VERIFIED WITH: Shelda Jakes PharmD 15:25 07/27/19 ([email or web address])    Enterobacter cloacae complex NOT DETECTED NOT DETECTED Final   Escherichia coli DETECTED (A) NOT DETECTED Final    Comment: CRITICAL RESULT CALLED TO, READ BACK BY AND VERIFIED WITH: Shelda Jakes PharmD 15:25 07/27/19 ([email or web address])    Klebsiella oxytoca NOT  DETECTED NOT DETECTED Final   Klebsiella pneumoniae NOT DETECTED NOT DETECTED Final   Proteus species NOT DETECTED NOT DETECTED Final   Serratia marcescens NOT DETECTED NOT DETECTED Final   Carbapenem resistance NOT DETECTED NOT DETECTED Final   Haemophilus influenzae NOT DETECTED NOT DETECTED Final   Neisseria meningitidis NOT DETECTED NOT DETECTED Final   Pseudomonas aeruginosa NOT DETECTED NOT DETECTED Final   Candida albicans NOT DETECTED NOT DETECTED Final   Candida glabrata NOT DETECTED NOT DETECTED Final   Candida krusei NOT DETECTED NOT DETECTED Final   Candida parapsilosis NOT DETECTED NOT DETECTED Final   Candida tropicalis NOT DETECTED NOT DETECTED Final    Comment: Performed at [facility],  [address] [place], [place] [address]  Blood Culture (routine x 2)     Status: None (Preliminary result)   Collection Time: 07/26/19  5:24 PM   Specimen: BLOOD RIGHT FOREARM  Result Value Ref Range Status   Specimen Description   Final    BLOOD RIGHT FOREARM Performed at [facility] [address] [place], [place] [address]    Special Requests   Final    BOTTLES DRAWN AEROBIC AND ANAEROBIC Blood Culture adequate volume Performed at [facility] [address] [place], [place] [address]    Culture  Setup Time   Final    GRAM NEGATIVE RODS IN BOTH AEROBIC AND ANAEROBIC BOTTLES CRITICAL VALUE NOTED.  VALUE IS CONSISTENT WITH PREVIOUSLY REPORTED AND CALLED VALUE.    Culture   Final    GRAM NEGATIVE RODS IDENTIFICATION TO FOLLOW Performed at [facility], [place] [address] [place], [place] [address]    Report Status PENDING  Incomplete     Radiology Studies: DG Chest Port 1 View  Result Date: 07/26/2019 CLINICAL DATA:  Shortness of breath, vomiting EXAM: PORTABLE CHEST 1 VIEW COMPARISON:  Radiograph 10/29/2017, CT 07/17/2018 FINDINGS: Slight left anterior obliquity accentuating the cardiomediastinal silhouette and  superimposing the mediastinum over the right lung. Coarsened interstitial changes are similar to comparison is. Some new reticulonodular opacity seen in the right mid lung. No pneumothorax. No effusion. The aorta is calcified. The remaining cardiomediastinal contours are unremarkable accounting for technique and rotation. No acute osseous or soft tissue abnormality. Degenerative changes are present in the imaged spine and shoulders. IMPRESSION: 1. New reticulonodular opacity seen in the right mid lung which may represent pneumonia. Recommend follow-up imaging to resolution. 2. Stable chronic interstitial changes. 3.  Aortic Atherosclerosis (ICD10-I70.0). Electronically Signed   By: Lovena Le M.D.   On: 07/26/2019 17:23   CT Renal Stone Study  Result Date: 07/26/2019 CLINICAL DATA:  74 year old female with abdominal discomfort, nausea vomiting. Flank pain. EXAM: CT ABDOMEN AND PELVIS WITHOUT CONTRAST TECHNIQUE: Multidetector CT imaging of the abdomen and pelvis was performed following the standard protocol without IV contrast. COMPARISON:  CT abdomen pelvis dated 07/17/2018. FINDINGS: Evaluation of this exam is limited in the absence of intravenous contrast. Lower chest: The visualized lung bases are clear. Multi vessel coronary vascular calcification noted. No intra-abdominal free air or free fluid. Hepatobiliary: Mild fatty infiltration of the liver. No intrahepatic biliary ductal dilatation. Several stones noted within the gallbladder. No pericholecystic fluid or evidence of acute cholecystitis by CT. Pancreas: Unremarkable. No pancreatic ductal dilatation or surrounding inflammatory changes. Spleen: Normal in size without focal abnormality. Adrenals/Urinary Tract: A 17 mm left adrenal nodule, likely an adenoma. The right adrenal gland is unremarkable. There is no hydronephrosis or nephrolithiasis on either side. A 2 cm right renal inferior pole hypodense lesion is not characterized on this noncontrast CT  but appears similar to prior CT. Mild bilateral perinephric stranding, nonspecific. Correlation with urinalysis recommended to evaluate for UTI. The urinary bladder is grossly unremarkable. Stomach/Bowel: There is a small hiatal hernia. Scattered colonic diverticula without active inflammatory changes. There is no bowel obstruction or active inflammation. The appendix is normal. Vascular/Lymphatic: Advanced aortoiliac atherosclerotic disease. The IVC is unremarkable. No portal venous gas. There is no adenopathy. Reproductive: Hysterectomy.  No adnexal masses. Other: None Musculoskeletal: Osteopenia with multilevel degenerative changes of the spine with disc desiccation and vacuum phenomena. No acute osseous pathology. IMPRESSION: 1. No hydronephrosis or nephrolithiasis. Correlation with  urinalysis recommended to evaluate for UTI. 2. Cholelithiasis. 3. No bowel obstruction or active inflammation.  Normal appendix. 4. Scattered colonic diverticula without active inflammatory changes. 5. Coronary vascular calcification and Aortic Atherosclerosis (ICD10-I70.0). Electronically Signed   By: Anner Crete M.D.   On: 07/26/2019 19:08    Scheduled Meds: . amLODipine  5 mg Oral QPM  . aspirin EC  81 mg Oral Daily  . atorvastatin  40 mg Oral QPM  . folic acid  1 mg Oral Daily  . heparin  5,000 Units Subcutaneous Q8H  . Melatonin  5 mg Oral QHS  . pantoprazole  40 mg Oral Daily  . predniSONE  5 mg Oral Daily  . venlafaxine XR  150 mg Oral QPM   Continuous Infusions: . sodium chloride 100 mL/hr at 07/28/19 1524  . aztreonam 1 g (07/28/19 1345)     LOS: 2 days   Marylu Lund, MD [facility] Pager On Amion  If 7PM-7AM, please contact night-coverage 07/28/2019, 4:06 PM

## 2019-07-28 NOTE — Plan of Care (Signed)
  Problem: Education: Goal: Knowledge of General Education information will improve Description: Including pain rating scale, medication(s)/side effects and non-pharmacologic comfort measures Outcome: Progressing   Problem: Health Behavior/Discharge Planning: Goal: Ability to manage health-related needs will improve Outcome: Progressing   Problem: Clinical Measurements: Goal: Ability to maintain clinical measurements within normal limits will improve Outcome: Progressing Goal: Will remain free from infection Outcome: Progressing Goal: Diagnostic test results will improve Outcome: Progressing Goal: Respiratory complications will improve Outcome: Progressing Goal: Cardiovascular complication will be avoided Outcome: Progressing   Problem: Nutrition: Goal: Adequate nutrition will be maintained Outcome: Progressing   Problem: Coping: Goal: Level of anxiety will decrease Outcome: Progressing   Problem: Elimination: Goal: Will not experience complications related to urinary retention Outcome: Progressing   Problem: Pain Managment: Goal: General experience of comfort will improve Outcome: Progressing   Problem: Safety: Goal: Ability to remain free from injury will improve Outcome: Progressing   Problem: Skin Integrity: Goal: Risk for impaired skin integrity will decrease Outcome: Progressing   Problem: Activity: Goal: Ability to tolerate increased activity will improve Outcome: Progressing   Problem: Clinical Measurements: Goal: Ability to maintain a body temperature in the normal range will improve Outcome: Progressing   Problem: Respiratory: Goal: Ability to maintain adequate ventilation will improve Outcome: Progressing Goal: Ability to maintain a clear airway will improve Outcome: Progressing

## 2019-07-28 NOTE — Progress Notes (Signed)
Made pharmacy and MD aware of pt having intermittent confusion, hallucinating (seeing birds in her room), easily redirects and is appropriate at this time

## 2019-07-28 NOTE — Progress Notes (Signed)
MD aware of pt's HR per verbal report

## 2019-07-29 ENCOUNTER — Inpatient Hospital Stay (HOSPITAL_COMMUNITY): Payer: Medicare HMO

## 2019-07-29 DIAGNOSIS — I4891 Unspecified atrial fibrillation: Secondary | ICD-10-CM | POA: Diagnosis not present

## 2019-07-29 DIAGNOSIS — R7881 Bacteremia: Secondary | ICD-10-CM | POA: Diagnosis present

## 2019-07-29 LAB — CULTURE, BLOOD (ROUTINE X 2)
Special Requests: ADEQUATE
Special Requests: ADEQUATE

## 2019-07-29 LAB — COMPREHENSIVE METABOLIC PANEL
ALT: 18 U/L (ref 0–44)
AST: 17 U/L (ref 15–41)
Albumin: 2.7 g/dL — ABNORMAL LOW (ref 3.5–5.0)
Alkaline Phosphatase: 75 U/L (ref 38–126)
Anion gap: 11 (ref 5–15)
BUN: 30 mg/dL — ABNORMAL HIGH (ref 8–23)
CO2: 17 mmol/L — ABNORMAL LOW (ref 22–32)
Calcium: 8 mg/dL — ABNORMAL LOW (ref 8.9–10.3)
Chloride: 111 mmol/L (ref 98–111)
Creatinine, Ser: 1.52 mg/dL — ABNORMAL HIGH (ref 0.44–1.00)
GFR calc Af Amer: 39 mL/min — ABNORMAL LOW (ref >60)
GFR calc non Af Amer: 34 mL/min — ABNORMAL LOW (ref >60)
Glucose, Bld: 93 mg/dL (ref 70–99)
Potassium: 3.4 mmol/L — ABNORMAL LOW (ref 3.5–5.1)
Sodium: 139 mmol/L (ref 135–145)
Total Bilirubin: 1.3 mg/dL — ABNORMAL HIGH (ref 0.3–1.2)
Total Protein: 5.7 g/dL — ABNORMAL LOW (ref 6.5–8.1)

## 2019-07-29 LAB — CBC
HCT: 28.5 % — ABNORMAL LOW (ref 36.0–46.0)
Hemoglobin: 9 g/dL — ABNORMAL LOW (ref 12.0–15.0)
MCH: 34.9 pg — ABNORMAL HIGH (ref 26.0–34.0)
MCHC: 31.6 g/dL (ref 30.0–36.0)
MCV: 110.5 fL — ABNORMAL HIGH (ref 80.0–100.0)
Platelets: 73 10*3/uL — ABNORMAL LOW (ref 150–400)
RBC: 2.58 MIL/uL — ABNORMAL LOW (ref 3.87–5.11)
RDW: 15.4 % (ref 11.5–15.5)
WBC: 3.4 10*3/uL — ABNORMAL LOW (ref 4.0–10.5)
nRBC: 0 % (ref 0.0–0.2)

## 2019-07-29 LAB — MAGNESIUM: Magnesium: 1.8 mg/dL (ref 1.7–2.4)

## 2019-07-29 LAB — PHOSPHORUS: Phosphorus: 3.3 mg/dL (ref 2.5–4.6)

## 2019-07-29 LAB — TSH: TSH: 1.914 u[IU]/mL (ref 0.350–4.500)

## 2019-07-29 MED ORDER — SODIUM CHLORIDE 0.9 % IV SOLN
1.0000 g | Freq: Two times a day (BID) | INTRAVENOUS | Status: AC
  Administered 2019-07-30 – 2019-07-31 (×5): 1 g via INTRAVENOUS
  Filled 2019-07-29 (×5): qty 1

## 2019-07-29 MED ORDER — SODIUM CHLORIDE 0.9 % IV SOLN
1.0000 g | INTRAVENOUS | Status: AC
  Administered 2019-07-29: 1 g via INTRAVENOUS
  Filled 2019-07-29 (×2): qty 1

## 2019-07-29 MED ORDER — DILTIAZEM HCL 25 MG/5ML IV SOLN
20.0000 mg | Freq: Once | INTRAVENOUS | Status: AC
  Administered 2019-07-29: 20 mg via INTRAVENOUS
  Filled 2019-07-29: qty 5

## 2019-07-29 MED ORDER — AMIODARONE HCL IN DEXTROSE 360-4.14 MG/200ML-% IV SOLN
30.0000 mg/h | INTRAVENOUS | Status: DC
  Administered 2019-07-30 (×2): 30 mg/h via INTRAVENOUS
  Filled 2019-07-29 (×3): qty 200

## 2019-07-29 MED ORDER — APIXABAN 5 MG PO TABS: 5.0000 mg | ORAL_TABLET | Freq: Two times a day (BID) | ORAL | Status: DC

## 2019-07-29 MED ORDER — DILTIAZEM HCL-DEXTROSE 125-5 MG/125ML-% IV SOLN (PREMIX)
5.0000 mg/h | INTRAVENOUS | Status: DC
  Administered 2019-07-29: 5 mg/h via INTRAVENOUS
  Administered 2019-07-30 (×2): 15 mg/h via INTRAVENOUS
  Filled 2019-07-29 (×3): qty 125

## 2019-07-29 MED ORDER — AMIODARONE LOAD VIA INFUSION
150.0000 mg | Freq: Once | INTRAVENOUS | Status: AC
  Administered 2019-07-29: 150 mg via INTRAVENOUS
  Filled 2019-07-29: qty 83.34

## 2019-07-29 MED ORDER — POTASSIUM CHLORIDE CRYS ER 20 MEQ PO TBCR
40.0000 meq | EXTENDED_RELEASE_TABLET | Freq: Two times a day (BID) | ORAL | Status: DC
  Administered 2019-07-29: 40 meq via ORAL
  Filled 2019-07-29 (×2): qty 2

## 2019-07-29 MED ORDER — MAGNESIUM SULFATE 2 GM/50ML IV SOLN
2.0000 g | Freq: Once | INTRAVENOUS | Status: AC
  Administered 2019-07-29: 2 g via INTRAVENOUS
  Filled 2019-07-29: qty 50

## 2019-07-29 MED ORDER — AMIODARONE HCL IN DEXTROSE 360-4.14 MG/200ML-% IV SOLN
60.0000 mg/h | INTRAVENOUS | Status: DC
  Administered 2019-07-29: 60 mg/h via INTRAVENOUS
  Filled 2019-07-29: qty 200

## 2019-07-29 MED ORDER — POTASSIUM CHLORIDE CRYS ER 20 MEQ PO TBCR
20.0000 meq | EXTENDED_RELEASE_TABLET | Freq: Two times a day (BID) | ORAL | Status: DC
  Administered 2019-07-29: 20 meq via ORAL
  Filled 2019-07-29: qty 1

## 2019-07-29 MED ORDER — METOPROLOL TARTRATE 5 MG/5ML IV SOLN
10.0000 mg | Freq: Once | INTRAVENOUS | Status: AC
  Administered 2019-07-29: 10 mg via INTRAVENOUS
  Filled 2019-07-29: qty 10

## 2019-07-29 NOTE — Progress Notes (Signed)
MD is aware of elevated HR as related to infection

## 2019-07-29 NOTE — Consult Note (Signed)
[facility] for Infectious Disease  Total days of antibiotics 4 aztreo               Reason for Consult: esbl ecoli bacteremia with hx of abtx allergy    Referring Physician: ghimire  Principal Problem:   Pneumonia Active Problems:   Hypokalemia   AKI (acute kidney injury) ([place])   Sepsis ([place])    HPI: Alexis Murphy is a 74 y.o. female with hx of sarcoidosis on methotrexate and hydroxyurea, HTN, HLD, hx of covid in 03/2019, admitted with weakness, SOB, and  Vomiting and diarrhea. She states that she has felt unwell, fatigued since having coivd in the Fall. She denied urinary urgency or dysuria. Denied other respiratory symptoms of cough or fever. Her infectious work up included cxr that showed new infiltrate, ua mild pyuria but no nitrites, few bacteria.  Give her hx of possible anaphylaxis to penicillin, she was started on aztreonam. Her Blood cx were found to have ecoli. Sensitivities came back this morning as esbl. ID asked to weigh in on management. On admit, WBC At 16.9 trended down to 3.4, yesterday's wbc still had left shift with 88% N. She has not had her weekly 10mg  of mtx this past Sunday. Remains on dilt drip. Feeling mildly better. Still denies dysuria  Past Medical History:  Diagnosis Date  . Arthritis   . Hyperlipidemia   . Sarcoidosis     Allergies:  Allergies  Allergen Reactions  . Penicillins Anaphylaxis and Rash    Did it involve swelling of the face/tongue/throat, SOB, or low BP? Yes  Did it involve sudden or severe rash/hives, skin peeling, or any reaction on the inside of your mouth or nose? No Did you need to seek medical attention at a hospital or doctor's office? No When did it last happen? Childhood If all above answers are "NO", may proceed with cephalosporin use.   . Codeine Other (See Comments)    Vomiting      MEDICATIONS: . aspirin EC  81 mg Oral Daily  . atorvastatin  40 mg Oral QPM  . folic acid  1 mg Oral Daily  . Melatonin  5 mg  Oral QHS  . pantoprazole  40 mg Oral Daily  . potassium chloride  20 mEq Oral BID  . predniSONE  5 mg Oral Daily  . venlafaxine XR  150 mg Oral QPM    Social History   Tobacco Use  . Smoking status: Former Smoker    Packs/day: 1.00    Years: 45.00    Pack years: 45.00    Types: Cigarettes    Quit date: 10/15/2007    Years since quitting: 11.7  . Smokeless tobacco: Never Used  Substance Use Topics  . Alcohol use: Yes  . Drug use: No    Family History  Problem Relation Age of Onset  . Heart disease Father   . Heart disease Brother     Review of Systems -  12 point ros is negative except what is mentioned in hpi  OBJECTIVE: Temp:  [98.3 F (36.8 C)-98.4 F (36.9 C)] 98.3 F (36.8 C) (01/20 1318) Pulse Rate:  [118-126] 126 (01/20 1318) Resp:  [18-20] 19 (01/20 1318) BP: (130-169)/(82-95) 130/95 (01/20 1318) SpO2:  [97 %-98 %] 97 % (01/20 1318) Physical Exam  Constitutional:  oriented to person, place, and time. appears well-developed and well-nourished. No distress.  HENT: [place]/AT, PERRLA, no scleral icterus Mouth/Throat: Oropharynx is clear and moist. No oropharyngeal exudate.  Cardiovascular: Normal rate, regular rhythm and normal heart sounds. Exam reveals no gallop and no friction rub.  No murmur heard.  Pulmonary/Chest: Effort normal and breath sounds normal. No respiratory distress.  has no wheezes.  Neck = supple, no nuchal rigidity Abdominal: Soft. Bowel sounds are normal.  exhibits no distension. There is no tenderness.  Lymphadenopathy: no cervical adenopathy. No axillary adenopathy Neurological: alert and oriented to person, place, and time.  Skin: numerous scattered echymosis Psychiatric: a normal mood and affect.  behavior is normal.   LABS: Results for orders placed or performed during the hospital encounter of 07/26/19 (from the past 48 hour(s))  CBC with Differential/Platelet     Status: Abnormal   Collection Time: 07/28/19  4:33 AM  Result Value Ref  Range   WBC 7.3 4.0 - 10.5 K/uL   RBC 2.55 (L) 3.87 - 5.11 MIL/uL   Hemoglobin 9.2 (L) 12.0 - 15.0 g/dL   HCT 28.2 (L) 36.0 - 46.0 %   MCV 110.6 (H) 80.0 - 100.0 fL   MCH 36.1 (H) 26.0 - 34.0 pg   MCHC 32.6 30.0 - 36.0 g/dL   RDW 15.1 11.5 - 15.5 %   Platelets 84 (L) 150 - 400 K/uL    Comment: REPEATED TO VERIFY Immature Platelet Fraction may be clinically indicated, consider ordering this additional test [id number] CONSISTENT WITH PREVIOUS RESULT    nRBC 0.0 0.0 - 0.2 %   Neutrophils Relative % 88 %   Neutro Abs 6.4 1.7 - 7.7 K/uL   Lymphocytes Relative 2 %   Lymphs Abs 0.2 (L) 0.7 - 4.0 K/uL   Monocytes Relative 3 %   Monocytes Absolute 0.2 0.1 - 1.0 K/uL   Eosinophils Relative 0 %   Eosinophils Absolute 0.0 0.0 - 0.5 K/uL   Basophils Relative 1 %   Basophils Absolute 0.1 0.0 - 0.1 K/uL   WBC Morphology MILD LEFT SHIFT (1-5% METAS, OCC MYELO, OCC BANDS)     Comment: TOXIC GRANULATION   Immature Granulocytes 6 %   Abs Immature Granulocytes 0.43 (H) 0.00 - 0.07 K/uL   Polychromasia PRESENT     Comment: Performed at [facility], [place] [address] [place], [place] [address]  Basic metabolic panel     Status: Abnormal   Collection Time: 07/28/19  4:33 AM  Result Value Ref Range   Sodium 141 135 - 145 mmol/L   Potassium 3.7 3.5 - 5.1 mmol/L    Comment: DELTA CHECK NOTED   Chloride 115 (H) 98 - 111 mmol/L   CO2 17 (L) 22 - 32 mmol/L   Glucose, Bld 111 (H) 70 - 99 mg/dL   BUN 34 (H) 8 - 23 mg/dL   Creatinine, Ser 1.67 (H) 0.44 - 1.00 mg/dL   Calcium 8.2 (L) 8.9 - 10.3 mg/dL   GFR calc non Af Amer 30 (L) >60 mL/min   GFR calc Af Amer 35 (L) >60 mL/min   Anion gap 9 5 - 15    Comment: Performed at [facility], [place] [address] [place], [place] [address]  Comprehensive metabolic panel     Status: Abnormal   Collection Time: 07/29/19  3:41 AM  Result Value Ref Range   Sodium 139 135 - 145 mmol/L   Potassium 3.4 (L) 3.5 - 5.1 mmol/L    Chloride 111 98 - 111 mmol/L   CO2 17 (L) 22 - 32 mmol/L   Glucose, Bld 93 70 - 99 mg/dL   BUN 30 (H)  8 - 23 mg/dL   Creatinine, Ser 1.52 (H) 0.44 - 1.00 mg/dL   Calcium 8.0 (L) 8.9 - 10.3 mg/dL   Total Protein 5.7 (L) 6.5 - 8.1 g/dL   Albumin 2.7 (L) 3.5 - 5.0 g/dL   AST 17 15 - 41 U/L   ALT 18 0 - 44 U/L   Alkaline Phosphatase 75 38 - 126 U/L   Total Bilirubin 1.3 (H) 0.3 - 1.2 mg/dL   GFR calc non Af Amer 34 (L) >60 mL/min   GFR calc Af Amer 39 (L) >60 mL/min   Anion gap 11 5 - 15    Comment: Performed at [facility], [place] [address], [place], [place] [address]  CBC     Status: Abnormal   Collection Time: 07/29/19  3:41 AM  Result Value Ref Range   WBC 3.4 (L) 4.0 - 10.5 K/uL   RBC 2.58 (L) 3.87 - 5.11 MIL/uL   Hemoglobin 9.0 (L) 12.0 - 15.0 g/dL   HCT 28.5 (L) 36.0 - 46.0 %   MCV 110.5 (H) 80.0 - 100.0 fL   MCH 34.9 (H) 26.0 - 34.0 pg   MCHC 31.6 30.0 - 36.0 g/dL   RDW 15.4 11.5 - 15.5 %   Platelets 73 (L) 150 - 400 K/uL    Comment: CONSISTENT WITH PREVIOUS RESULT   nRBC 0.0 0.0 - 0.2 %    Comment: Performed at [facility], [place] [address], [place], [place] [address]  Magnesium     Status: None   Collection Time: 07/29/19  8:04 AM  Result Value Ref Range   Magnesium 1.8 1.7 - 2.4 mg/dL    Comment: Performed at [facility], [place] [address], [place], [place] [address]  Phosphorus     Status: None   Collection Time: 07/29/19  8:04 AM  Result Value Ref Range   Phosphorus 3.3 2.5 - 4.6 mg/dL    Comment: Performed at [facility], [place] [address] [place], [place] [address]    MICRO: 1/20 blood cx pending 1/17 blood cx ecoli 1/17 urine cx ecoli esbl IMAGING: No results found.  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  - place patient on contact isolation due to ESBL  Leukopenia = could be still related to untreated esbl ecoli infection. This does not appear to be her baseline. We will continue to monitor  Diarrhea = appears resolving since starting antibiotics  Shortness of breath = suspect related to deconditioning or underlying sarcoidosis.

## 2019-07-29 NOTE — Progress Notes (Signed)
Antimicrobial Stewardship - Brief Note   Late entry - patient seen 07/28/2019 at ~15:00   Spoke to patient about her PCN allergy.  She states it occurred when 74 yr old.  Did not have swelling or shortness of breath. She stated she had an outbreak on her lips, she said they were not blisters but unable to provide further explanation.  When asked about taking other beta - lactams, and I mentioned "keflex" she stated she had taken keflex for an abdominal abscess.  She stated she would be willing to try antibiotics in cephalosporin class since tolerated cephalexin.   Blood culture now growing ESBL E. Coli and believe risk of a reaction to carbapenem would very unlikely as she tolerated cephalexin per her account.  Doreene Eland, PharmD, BCPS.   Work Cell: [phone] 07/29/2019 8:29 AM

## 2019-07-29 NOTE — Progress Notes (Signed)
Attempted echo.  HR is elevated (150+ bpm).  Will attempt once HR normalizes.

## 2019-07-29 NOTE — Progress Notes (Signed)
PROGRESS NOTE    Alexis Murphy  YQI:[id number] DOB: 06/13/1946 DOA: 07/26/2019 PCP: Lawerance Cruel, MD    Brief Narrative:  74 year old female with history of hypertension, hyperlipidemia, sarcoidosis on prednisone and methotrexate and arthritis presented to the emergency room with generalized weakness, shortness of breath nausea and vomiting.  Patient was diagnosed with COVID-19 infection with similar symptoms and September 2020.  In the emergency room her blood pressures were stable.  Heart rate 117.  WBC 16.9.  Hypokalemia.  Creatinine 2.5.  Lactic acid 2.  Procalcitonin 134.  Chest x-ray showed reticulonodular opacity in the right lung.  Treated with IV fluids Decadron and admitted with IV antibiotics.   Assessment & Plan:   Principal Problem:   Bacteremia due to Gram-negative bacteria Active Problems:   Pneumonia   Hypokalemia   AKI (acute kidney injury) ([place])   Sepsis ([place])   New onset a-fib ([facility])  Sepsis present on admission secondary to ESBL E. coli bacteremia: Primary source unknown.  Probably urinary source.  Previously on aztreonam, changed to meropenem with monitoring for allergy. Urine culture not sent on admission.  Will check urine cultures today, repeat blood cultures today. CT scan of the abdomen pelvis with no evidence of abscesses or secondary cause for infection, likely perinephric stranding. Chest x-ray shows reticulo nodular opacity that is consistent with history of sarcoidosis.  No evidence of pneumonia. Appreciate infectious disease help for antibiotic management.  New onset A. fib with RVR: Probably related to sepsis.  Difficult to control heart rate. Cardizem 20 mg bolus and Cardizem infusion.  Additional dose of metoprolol 10 mg IV given. Check TSH.  Replace electrolytes. Echocardiogram. Cardiology consult. Hold off on anticoagulation pending echocardiogram and will check for persistence of A. Fib.  Acute kidney injury with history of chronic  kidney disease stage IIIa:: Known baseline creatinine about 1.26.  Treated with IV fluids with gradual clinical improvement.  Continue maintenance IV fluid today.  Hypertension: Hold amlodipine.  Starting on beta-blockers and Cardizem.  Hypokalemia: Replace.  Monitor.  Recheck magnesium and phosphorus in the morning labs.  Sarcoidosis: Followed by oncology outpatient.  Currently on maintenance steroids and methotrexate that is continued.  Thrombocytopenia/leukocytopenia: Probably related to acute bacterial sepsis.  Unlikely HIT.  Will discontinue heparin and monitor.  DVT prophylaxis: SCDs. Code Status: Full code Family Communication: Patient's son on the phone Disposition Plan: Unknown at this time.  Anticipate home after clinical improvement.   Consultants:   Cardiology  Procedures:   None  Antimicrobials:  Anti-infectives (From admission, onward)   Start     Dose/Rate Route Frequency Ordered Stop   07/30/19 0000  meropenem (MERREM) 1 g in sodium chloride 0.9 % 100 mL IVPB     1 g 200 mL/hr over 30 Minutes Intravenous Every 12 hours 07/29/19 1414     07/29/19 1400  meropenem (MERREM) 1 g in sodium chloride 0.9 % 100 mL IVPB     1 g 200 mL/hr over 30 Minutes Intravenous STAT 07/29/19 1347 07/30/19 1400   07/28/19 2200  levofloxacin (LEVAQUIN) IVPB 750 mg  Status:  Discontinued     750 mg 100 mL/hr over 90 Minutes Intravenous Every 48 hours 07/27/19 0356 07/27/19 1531   07/27/19 1600  aztreonam (AZACTAM) 1 g in sodium chloride 0.9 % 100 mL IVPB  Status:  Discontinued     1 g 200 mL/hr over 30 Minutes Intravenous Every 8 hours 07/27/19 1532 07/29/19 1345   07/26/19 1930  levofloxacin (LEVAQUIN) IVPB 750  mg     750 mg 100 mL/hr over 90 Minutes Intravenous  Once 07/26/19 1928 07/26/19 2311         Subjective: Patient seen and examined.  Patient herself was without any complaints.  In the afternoon, she developed rapid A. fib with RVR, heart rate more than 200.  Patient  was pleasant and denied any chest pain or shortness of breath or palpitations. Patient's son reported that she was somehow confused.  Objective: Vitals:   07/28/19 1318 07/28/19 2007 07/29/19 0514 07/29/19 1318  BP: (!) 161/98 (!) 151/82 (!) 169/88 (!) 130/95  Pulse: (!) 116 (!) 118 (!) 120 (!) 126  Resp: 20 18 20 19   Temp: (!) 97.5 F (36.4 C) 98.4 F (36.9 C) 98.4 F (36.9 C) 98.3 F (36.8 C)  TempSrc: Oral Oral Oral Oral  SpO2: 100% 98% 97% 97%  Weight:      Height:        Intake/Output Summary (Last 24 hours) at 07/29/2019 1452 Last data filed at 07/29/2019 1408 Gross per 24 hour  Intake 3763.09 ml  Output 1750 ml  Net 2013.09 ml   Filed Weights   07/26/19 2324 07/27/19 2227  Weight: 68.9 kg 76.9 kg    Examination:  General exam: Appears calm and comfortable, on room air. Respiratory system: Clear to auscultation. Respiratory effort normal.  No added sounds. Cardiovascular system: S1 & S2 heard, irregularly irregular and tachycardic.  No JVD, murmurs, rubs, gallops or clicks. No pedal edema. Gastrointestinal system: Abdomen is nondistended, soft and nontender. No organomegaly or masses felt. Normal bowel sounds heard. Central nervous system: Alert and oriented. No focal neurological deficits. Extremities: Symmetric 5 x 5 power. Skin: No rashes, lesions or ulcers Psychiatry: Judgement and insight appear normal. Mood & affect appropriate.     Data Reviewed: I have personally reviewed following labs and imaging studies  CBC: Recent Labs  Lab 07/26/19 1632 07/26/19 1758 07/27/19 0547 07/28/19 0433 07/29/19 0341  WBC 16.9* 15.2* 11.3* 7.3 3.4*  NEUTROABS  --   --   --  6.4  --   HGB 10.3* 9.7* 9.3* 9.2* 9.0*  HCT 29.9* 28.5* 28.0* 28.2* 28.5*  MCV 104.2* 106.7* 107.7* 110.6* 110.5*  PLT 134* 119* 90* 84* 73*   Basic Metabolic Panel: Recent Labs  Lab 07/26/19 1632 07/26/19 1758 07/27/19 0547 07/28/19 0433 07/29/19 0341 07/29/19 0804  NA 129*  --  137  141 139  --   K 2.8*  --  2.8* 3.7 3.4*  --   CL 93*  --  107 115* 111  --   CO2 22  --  19* 17* 17*  --   GLUCOSE 114*  --  129* 111* 93  --   BUN 40*  --  33* 34* 30*  --   CREATININE 2.54* 2.55* 2.04* 1.67* 1.52*  --   CALCIUM 8.2*  --  8.0* 8.2* 8.0*  --   MG  --   --   --   --   --  1.8  PHOS  --   --   --   --   --  3.3   GFR: Estimated Creatinine Clearance: 31.4 mL/min (A) (by C-G formula based on SCr of 1.52 mg/dL (H)). Liver Function Tests: Recent Labs  Lab 07/26/19 1632 07/27/19 0547 07/29/19 0341  AST 21 16 17   ALT 17 17 18   ALKPHOS 114 99 75  BILITOT 1.3* 1.1 1.3*  PROT 6.3* 5.7* 5.7*  ALBUMIN 3.5 2.8*  2.7*   Recent Labs  Lab 07/26/19 1632  LIPASE 15   No results for input(s): AMMONIA in the last 168 hours. Coagulation Profile: No results for input(s): INR, PROTIME in the last 168 hours. Cardiac Enzymes: No results for input(s): CKTOTAL, CKMB, CKMBINDEX, TROPONINI in the last 168 hours. BNP (last 3 results) No results for input(s): PROBNP in the last 8760 hours. HbA1C: No results for input(s): HGBA1C in the last 72 hours. CBG: No results for input(s): GLUCAP in the last 168 hours. Lipid Profile: Recent Labs    07/26/19 1719  TRIG 167*   Thyroid Function Tests: No results for input(s): TSH, T4TOTAL, FREET4, T3FREE, THYROIDAB in the last 72 hours. Anemia Panel: Recent Labs    07/26/19 1719  FERRITIN 97   Sepsis Labs: Recent Labs  Lab 07/26/19 1719 07/26/19 2027  PROCALCITON 134.68  --   LATICACIDVEN 2.0* 1.9    Recent Results (from the past 240 hour(s))  SARS CORONAVIRUS 2 (TAT 6-24 HRS) Nasopharyngeal Nasopharyngeal Swab     Status: None   Collection Time: 07/26/19  5:03 PM   Specimen: Nasopharyngeal Swab  Result Value Ref Range Status   SARS Coronavirus 2 NEGATIVE NEGATIVE Final    Comment: (NOTE) SARS-CoV-2 target nucleic acids are NOT DETECTED. The SARS-CoV-2 RNA is generally detectable in upper and lower respiratory specimens  during the acute phase of infection. Negative results do not preclude SARS-CoV-2 infection, do not rule out co-infections with other pathogens, and should not be used as the sole basis for treatment or other patient management decisions. Negative results must be combined with clinical observations, patient history, and epidemiological information. The expected result is Negative. Fact Sheet for Patients: SugarRoll.be Fact Sheet for Healthcare Providers: https://www.woods-mathews.com/ This test is not yet approved or cleared by the [place] FDA and  has been authorized for detection and/or diagnosis of SARS-CoV-2 by FDA under an Emergency Use Authorization (EUA). This EUA will remain  in effect (meaning this test can be used) for the duration of the COVID-19 declaration under Section 56 4(b)(1) of the Act, 21 U.S.C. section 360bbb-3(b)(1), unless the authorization is terminated or revoked sooner. Performed at [facility], [place] [address] [place], [place] [address]   Blood Culture (routine x 2)     Status: Abnormal   Collection Time: 07/26/19  5:19 PM   Specimen: BLOOD LEFT HAND  Result Value Ref Range Status   Specimen Description   Final    BLOOD LEFT HAND Performed at [facility] [address], [place], [place] [address]    Special Requests   Final    BOTTLES DRAWN AEROBIC AND ANAEROBIC Blood Culture adequate volume Performed at [facility] [address], [place], [place] [address]    Culture  Setup Time   Final    GRAM NEGATIVE RODS IN BOTH AEROBIC AND ANAEROBIC BOTTLES CRITICAL RESULT CALLED TO, READ BACK BY AND VERIFIED WITH: Shelda Jakes PharmD 15:25 07/27/19 ([email or web address]) Performed at [facility], [place] [address] [place], [place] [address]    Culture (A)  Final    ESCHERICHIA COLI Confirmed Extended Spectrum Beta-Lactamase Producer (ESBL).  In bloodstream infections from ESBL  organisms, carbapenems are preferred over piperacillin/tazobactam. They are shown to have a lower risk of mortality.    Report Status 07/29/2019 FINAL  Final   Organism ID, Bacteria ESCHERICHIA COLI  Final      Susceptibility   Escherichia coli - MIC*    AMPICILLIN >=32  RESISTANT Resistant     CEFAZOLIN >=64 RESISTANT Resistant     CEFEPIME 16 RESISTANT Resistant     CEFTAZIDIME RESISTANT Resistant     CEFTRIAXONE >=64 RESISTANT Resistant     CIPROFLOXACIN >=4 RESISTANT Resistant     GENTAMICIN >=16 RESISTANT Resistant     IMIPENEM <=0.25 SENSITIVE Sensitive     TRIMETH/SULFA >=320 RESISTANT Resistant     AMPICILLIN/SULBACTAM >=32 RESISTANT Resistant     PIP/TAZO <=4 SENSITIVE Sensitive     * ESCHERICHIA COLI  Blood Culture ID Panel (Reflexed)     Status: Abnormal   Collection Time: 07/26/19  5:19 PM  Result Value Ref Range Status   Enterococcus species NOT DETECTED NOT DETECTED Final   Listeria monocytogenes NOT DETECTED NOT DETECTED Final   Staphylococcus species NOT DETECTED NOT DETECTED Final   Staphylococcus aureus (BCID) NOT DETECTED NOT DETECTED Final   Streptococcus species NOT DETECTED NOT DETECTED Final   Streptococcus agalactiae NOT DETECTED NOT DETECTED Final   Streptococcus pneumoniae NOT DETECTED NOT DETECTED Final   Streptococcus pyogenes NOT DETECTED NOT DETECTED Final   Acinetobacter baumannii NOT DETECTED NOT DETECTED Final   Enterobacteriaceae species DETECTED (A) NOT DETECTED Final    Comment: Enterobacteriaceae represent a large family of gram-negative bacteria, not a single organism. CRITICAL RESULT CALLED TO, READ BACK BY AND VERIFIED WITH: Shelda Jakes PharmD 15:25 07/27/19 ([email or web address])    Enterobacter cloacae complex NOT DETECTED NOT DETECTED Final   Escherichia coli DETECTED (A) NOT DETECTED Final    Comment: CRITICAL RESULT CALLED TO, READ BACK BY AND VERIFIED WITH: Shelda Jakes PharmD 15:25 07/27/19 ([email or web address])    Klebsiella oxytoca NOT DETECTED NOT DETECTED  Final   Klebsiella pneumoniae NOT DETECTED NOT DETECTED Final   Proteus species NOT DETECTED NOT DETECTED Final   Serratia marcescens NOT DETECTED NOT DETECTED Final   Carbapenem resistance NOT DETECTED NOT DETECTED Final   Haemophilus influenzae NOT DETECTED NOT DETECTED Final   Neisseria meningitidis NOT DETECTED NOT DETECTED Final   Pseudomonas aeruginosa NOT DETECTED NOT DETECTED Final   Candida albicans NOT DETECTED NOT DETECTED Final   Candida glabrata NOT DETECTED NOT DETECTED Final   Candida krusei NOT DETECTED NOT DETECTED Final   Candida parapsilosis NOT DETECTED NOT DETECTED Final   Candida tropicalis NOT DETECTED NOT DETECTED Final    Comment: Performed at [facility], [place] [address], [place], [place] [address]  Blood Culture (routine x 2)     Status: Abnormal   Collection Time: 07/26/19  5:24 PM   Specimen: BLOOD RIGHT FOREARM  Result Value Ref Range Status   Specimen Description   Final    BLOOD RIGHT FOREARM Performed at [facility] [address] [place], [place] [address]    Special Requests   Final    BOTTLES DRAWN AEROBIC AND ANAEROBIC Blood Culture adequate volume Performed at [facility] [address] [place], [place] [address]    Culture  Setup Time   Final    GRAM NEGATIVE RODS IN BOTH AEROBIC AND ANAEROBIC BOTTLES CRITICAL VALUE NOTED.  VALUE IS CONSISTENT WITH PREVIOUSLY REPORTED AND CALLED VALUE.    Culture (A)  Final    ESCHERICHIA COLI SUSCEPTIBILITIES PERFORMED ON PREVIOUS CULTURE WITHIN THE LAST 5 DAYS. Performed at [facility], [place] [address] [place], [place] [address]    Report Status 07/29/2019 FINAL  Final         Radiology Studies: No results found.  Scheduled Meds: . aspirin EC  81 mg Oral Daily  . atorvastatin  40 mg Oral QPM  . folic acid  1 mg Oral Daily  . Melatonin  5 mg Oral QHS  . pantoprazole  40 mg Oral Daily  . potassium chloride  20 mEq Oral BID  .  predniSONE  5 mg Oral Daily  . venlafaxine XR  150 mg Oral QPM   Continuous Infusions: . sodium chloride Stopped (07/29/19 1446)  . diltiazem (CARDIZEM) infusion 5 mg/hr (07/29/19 1447)  . meropenem (MERREM) IV    . [START ON 07/30/2019] meropenem (MERREM) IV       LOS: 3 days    Time spent: 35 minutes    Barb Merino, MD [facility] Pager [phone]

## 2019-07-29 NOTE — Plan of Care (Signed)

## 2019-07-29 NOTE — Progress Notes (Addendum)
Pharmacy Antibiotic Note  Alexis Murphy is a 74 y.o. female admitted on 07/26/2019 with bacteremia.  Pharmacy has been consulted for meropenem dosing.  1/17 Blood culture = ESBL E. Coli.  No urine culture was ordered prior to start of antibiotics.   Urine cx and blood cx from 1/20 pending  PCN allergy listed - discuss with patient 1/19 and she states she took keflex in past for abdominal abscess.   Plan:  Meropenem 1gm IV q12h based on current renal function.   Risk of cross reactivity with PCN is expected to be very low, especially if tolerated keflex as patient stated.    Monitor renal function  Monitor for possibility of need for outpatient antibiotics  Height: 5\' 1"  (154.9 cm) Weight: 169 lb 9.6 oz (76.9 kg) IBW/kg (Calculated) : 47.8  Temp (24hrs), Avg:98.4 F (36.9 C), Min:98.3 F (36.8 C), Max:98.4 F (36.9 C)  Recent Labs  Lab 07/26/19 1632 07/26/19 1719 07/26/19 1758 07/26/19 2027 07/27/19 0547 07/28/19 0433 07/29/19 0341  WBC 16.9*  --  15.2*  --  11.3* 7.3 3.4*  CREATININE 2.54*  --  2.55*  --  2.04* 1.67* 1.52*  LATICACIDVEN  --  2.0*  --  1.9  --   --   --     Estimated Creatinine Clearance: 31.4 mL/min (A) (by C-G formula based on SCr of 1.52 mg/dL (H)).    Allergies  Allergen Reactions  . Penicillins Anaphylaxis and Rash    Did it involve swelling of the face/tongue/throat, SOB, or low BP? Yes  Did it involve sudden or severe rash/hives, skin peeling, or any reaction on the inside of your mouth or nose? No Did you need to seek medical attention at a hospital or doctor's office? No When did it last happen? Childhood If all above answers are "NO", may proceed with cephalosporin use.   . Codeine Other (See Comments)    Vomiting     Antimicrobials this admission:  1/17 Levofloxacin >> 1/18 1/18 Aztreonam >> 1/20 1/20 meropenem >>  Dose adjustments this admission:  --  Microbiology results:  1/17 BCx: 4/4 GNR, BCID = E.coli.  ESBL 1/17  COVID: negative  Thank you for allowing pharmacy to be a part of this patient's care.  Doreene Eland, PharmD, BCPS.   Work Cell: [phone] 07/29/2019 2:39 PM

## 2019-07-29 NOTE — Progress Notes (Signed)
Physical Therapy Treatment Patient Details Name: Alexis Murphy MRN: [id number] DOB: Apr 11, 1946 Today's Date: 07/29/2019    History of Present Illness 74 yo female admitted with Pna, weakness. hx of sarcoidosis, COVID 19, OA    PT Comments    Progressing with mobility. HR up to 145 bpm, dyspnea 2/4 with ambulation. Pt is unsteady and at risk for falls. Recommend RW use for safe ambulation.    Follow Up Recommendations  Home health PT;Supervision/Assistance - 24 hour     Equipment Recommendations  Rolling walker with 5" wheels    Recommendations for Other Services       Precautions / Restrictions Precautions Precautions: Fall Precaution Comments: monitor HR Restrictions Weight Bearing Restrictions: No    Mobility  Bed Mobility Overal bed mobility: Needs Assistance Bed Mobility: Supine to Sit;Sit to Supine     Supine to sit: Min assist Sit to supine: Min guard;HOB elevated   General bed mobility comments: Assist for trunk. Increased time.  Transfers Overall transfer level: Needs assistance Equipment used: None Transfers: Sit to/from Stand Sit to Stand: Min assist         General transfer comment: Assist to steady.  Ambulation/Gait Ambulation/Gait assistance: Min Web designer (Feet): 75 Feet Assistive device: IV Pole Gait Pattern/deviations: Step-through pattern     General Gait Details: Unsteady. LOB x 1 when scanning environmenet and walking. HR up to 145 bpm, dyspnea 2/4   Stairs             Wheelchair Mobility    Modified Rankin (Stroke Patients Only)       Balance Overall balance assessment: Needs assistance         Standing balance support: Single extremity supported Standing balance-Leahy Scale: Poor                              Cognition Arousal/Alertness: Awake/alert Behavior During Therapy: WFL for tasks assessed/performed Overall Cognitive Status: Within Functional Limits for tasks assessed                                  General Comments: per chart, pt has been hallucinating      Exercises      General Comments        Pertinent Vitals/Pain Pain Assessment: No/denies pain    Home Living                      Prior Function            PT Goals (current goals can now be found in the care plan section) Progress towards PT goals: Progressing toward goals    Frequency    Min 3X/week      PT Plan Current plan remains appropriate    Co-evaluation              AM-PAC PT "6 Clicks" Mobility   Outcome Measure  Help needed turning from your back to your side while in a flat bed without using bedrails?: A Little Help needed moving from lying on your back to sitting on the side of a flat bed without using bedrails?: A Little Help needed moving to and from a bed to a chair (including a wheelchair)?: A Little Help needed standing up from a chair using your arms (e.g., wheelchair or bedside chair)?: A Little Help needed to walk in hospital room?:  A Little Help needed climbing 3-5 steps with a railing? : A Little 6 Click Score: 18    End of Session Equipment Utilized During Treatment: Gait belt Activity Tolerance: Patient limited by fatigue(limited by dyspnea , tachy HR) Patient left: in bed;with call bell/phone within reach;with bed alarm set   PT Visit Diagnosis: Difficulty in walking, not elsewhere classified (R26.2);Muscle weakness (generalized) (M62.81)     Time: 1019-1030 PT Time Calculation (min) (ACUTE ONLY): 11 min  Charges:  $Gait Training: 8-22 mins                         Doreatha Massed, PT Acute Rehabilitation

## 2019-07-29 NOTE — Progress Notes (Signed)
Persistent Afib, HR 150-160 despite Cardizem 20 bolus and 15 mg infusion, metoprolol 10 mg iv bolus. BP 119/79. Patient still with no symptoms of palpitations/ chest pain.  D/w Dr Tamala Julian with cardiology Load and maintain amiodarone Dc cardizem once HR <100 or BP < 100 Hold off on anticoagulation as Afib is relatively new.  Replace Mg and K aggressively,  Magnesium 2 gm and KCL additional 40 mg now before starting amiodarone.  Move to SDU if needed.

## 2019-07-29 NOTE — Progress Notes (Signed)
  Amiodarone Drug - Drug Interaction Consult Note  Recommendations:  Continue monitoring K/Mg and QTc while loading with Amio  No major drug interactions; expect to stop diltiazem once HR controlled by amiodarone, and recent Levofloxacin has been discontinued.  Amiodarone is metabolized by the cytochrome P450 system and therefore has the potential to cause many drug interactions. Amiodarone has an average plasma half-life of 50 days (range 20 to 100 days).   There is potential for drug interactions to occur several weeks or months after stopping treatment and the onset of drug interactions may be slow after initiating amiodarone.   [x]  Statins: Increased risk of myopathy. Simvastatin- restrict dose to 20mg  daily. Other statins: counsel patients to report any muscle pain or weakness immediately.  []  Anticoagulants: Amiodarone can increase anticoagulant effect. Consider warfarin dose reduction. Patients should be monitored closely and the dose of anticoagulant altered accordingly, remembering that amiodarone levels take several weeks to stabilize.  []  Antiepileptics: Amiodarone can increase plasma concentration of phenytoin, the dose should be reduced. Note that small changes in phenytoin dose can result in large changes in levels. Monitor patient and counsel on signs of toxicity.  []  Beta blockers: increased risk of bradycardia, AV block and myocardial depression. Sotalol - avoid concomitant use.  [x]   Calcium channel blockers (diltiazem and verapamil): increased risk of bradycardia, AV block and myocardial depression.  []   Cyclosporine: Amiodarone increases levels of cyclosporine. Reduced dose of cyclosporine is recommended.  []  Digoxin dose should be halved when amiodarone is started.  []  Diuretics: increased risk of cardiotoxicity if hypokalemia occurs.  []  Oral hypoglycemic agents (glyburide, glipizide, glimepiride): increased risk of hypoglycemia. Patient's glucose levels should be  monitored closely when initiating amiodarone therapy.   []  Drugs that prolong the QT interval:  Torsades de pointes risk may be increased with concurrent use - avoid if possible.  Monitor QTc, also keep magnesium/potassium WNL if concurrent therapy can't be avoided. Marland Kitchen Antibiotics: e.g. fluoroquinolones, erythromycin. . Antiarrhythmics: e.g. quinidine, procainamide, disopyramide, sotalol. . Antipsychotics: e.g. phenothiazines, haloperidol.  . Lithium, tricyclic antidepressants, and methadone.  Thank You,  Samauri Kellenberger A  07/29/2019 5:52 PM

## 2019-07-29 NOTE — TOC Initial Note (Signed)
Transition of Care [facility]) - Initial/Assessment Note    Patient Details  Name: Alexis Murphy MRN: [id number] Date of Birth: 1945-11-24  Transition of Care [facility]) CM/SW Contact:    Dessa Phi, RN Phone Number: 07/29/2019, 11:40 AM  Clinical Narrative:  D/c home w/[employer] rep [facility] Saturday 08/01/19. Has own transport home.                 Expected Discharge Plan: [place] Barriers to Discharge: Continued Medical Work up   Patient Goals and CMS Choice Patient states their goals for this hospitalization and ongoing recovery are:: go home CMS Medicare.gov Compare Post Acute Care list provided to:: Patient Choice offered to / list presented to : Patient  Expected Discharge Plan and Services Expected Discharge Plan: [place]   Discharge Planning Services: CM Consult Post Acute Care Choice: [place] arrangements for the past 2 months: [place]: PT, OT HH Agency: [facility] Date [place]: 07/29/19 Time [place]: 1137 Representative spoke with at [place]: [place] Arrangements/Services Living arrangements for the past 2 months: [place] with:: Adult Children   Do you feel safe going back to the place where you live?: Yes      Need for Family Participation in Patient Care: No (Comment) Care giver support system in place?: Yes (comment) Current home services: DME(rw,3n1) Criminal Activity/Legal Involvement Pertinent to Current Situation/Hospitalization: No - Comment as needed  Activities of Daily Living Home Assistive Devices/Equipment: Eyeglasses, Dentures (specify type)(lower partial plate) ADL Screening (condition at time of admission) Patient's cognitive ability adequate to safely complete daily activities?: Yes Is the patient deaf or have difficulty hearing?: No Does the patient have difficulty seeing,  even when wearing glasses/contacts?: No Does the patient have difficulty concentrating, remembering, or making decisions?: No Patient able to express need for assistance with ADLs?: Yes Does the patient have difficulty dressing or bathing?: Yes(secondary to shortness of breath) Independently performs ADLs?: Yes (appropriate for developmental age)(has some shortness of breath) Does the patient have difficulty walking or climbing stairs?: Yes(secondary to shortness of breath) Weakness of Legs: Both Weakness of Arms/Hands: None  Permission Sought/Granted Permission sought to share information with : Case Manager Permission granted to share information with : Yes, Verbal Permission Granted  Share Information with NAME: Case Manager Juliann Pulse  Permission granted to share info w AGENCY: [employer]        Emotional Assessment Appearance:: Appears stated age Attitude/Demeanor/Rapport: Gracious Affect (typically observed): Accepting Orientation: : Oriented to Self, Oriented to Place, Oriented to  Time, Oriented to Situation Alcohol / Substance Use: Not Applicable Psych Involvement: No (comment)  Admission diagnosis:  Pneumonia [J18.9] AKI (acute kidney injury) ([place]) [N17.9] Community acquired pneumonia of right lung, unspecified part of lung [J18.9] Patient Active Problem List   Diagnosis Date Noted  . Hypokalemia 07/27/2019  . AKI (acute kidney injury) ([place]) 07/27/2019  . Sepsis ([place]) 07/27/2019  . Pneumonia 07/26/2019  . 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 PAD (peripheral artery disease) ([place]) 05/26/2018  . Sarcoidosis 10/29/2017   PCP:  Lawerance Cruel, MD Pharmacy:   [id number] - [place], [place] - [place] [address] [address] Phone: [phone] Fax: [phone]     Social Determinants of Health (  SDOH) Interventions    Readmission Risk Interventions No flowsheet data found.

## 2019-07-29 NOTE — Progress Notes (Signed)
Received call from [place], pt's HR up to  200, sustaining 170-190s, MD text paged and was on unit, see new orders, EKG obtained, pt asymptomatic

## 2019-07-30 ENCOUNTER — Inpatient Hospital Stay (HOSPITAL_COMMUNITY): Payer: Medicare HMO

## 2019-07-30 DIAGNOSIS — I4891 Unspecified atrial fibrillation: Secondary | ICD-10-CM

## 2019-07-30 DIAGNOSIS — R7881 Bacteremia: Secondary | ICD-10-CM

## 2019-07-30 DIAGNOSIS — I340 Nonrheumatic mitral (valve) insufficiency: Secondary | ICD-10-CM

## 2019-07-30 DIAGNOSIS — I351 Nonrheumatic aortic (valve) insufficiency: Secondary | ICD-10-CM

## 2019-07-30 DIAGNOSIS — N179 Acute kidney failure, unspecified: Secondary | ICD-10-CM

## 2019-07-30 LAB — BASIC METABOLIC PANEL
Anion gap: 11 (ref 5–15)
BUN: 30 mg/dL — ABNORMAL HIGH (ref 8–23)
CO2: 17 mmol/L — ABNORMAL LOW (ref 22–32)
Calcium: 7.7 mg/dL — ABNORMAL LOW (ref 8.9–10.3)
Chloride: 110 mmol/L (ref 98–111)
Creatinine, Ser: 1.66 mg/dL — ABNORMAL HIGH (ref 0.44–1.00)
GFR calc Af Amer: 35 mL/min — ABNORMAL LOW (ref >60)
GFR calc non Af Amer: 30 mL/min — ABNORMAL LOW (ref >60)
Glucose, Bld: 92 mg/dL (ref 70–99)
Potassium: 3.4 mmol/L — ABNORMAL LOW (ref 3.5–5.1)
Sodium: 138 mmol/L (ref 135–145)

## 2019-07-30 LAB — CBC WITH DIFFERENTIAL/PLATELET
Abs Immature Granulocytes: 0.07 10*3/uL (ref 0.00–0.07)
Basophils Absolute: 0 10*3/uL (ref 0.0–0.1)
Basophils Relative: 0 %
Eosinophils Absolute: 0 10*3/uL (ref 0.0–0.5)
Eosinophils Relative: 0 %
HCT: 26.7 % — ABNORMAL LOW (ref 36.0–46.0)
Hemoglobin: 8.3 g/dL — ABNORMAL LOW (ref 12.0–15.0)
Immature Granulocytes: 1 %
Lymphocytes Relative: 6 %
Lymphs Abs: 0.4 10*3/uL — ABNORMAL LOW (ref 0.7–4.0)
MCH: 36.1 pg — ABNORMAL HIGH (ref 26.0–34.0)
MCHC: 31.1 g/dL (ref 30.0–36.0)
MCV: 116.1 fL — ABNORMAL HIGH (ref 80.0–100.0)
Monocytes Absolute: 0.2 10*3/uL (ref 0.1–1.0)
Monocytes Relative: 4 %
Neutro Abs: 5.8 10*3/uL (ref 1.7–7.7)
Neutrophils Relative %: 89 %
Platelets: 69 10*3/uL — ABNORMAL LOW (ref 150–400)
RBC: 2.3 MIL/uL — ABNORMAL LOW (ref 3.87–5.11)
RDW: 15.8 % — ABNORMAL HIGH (ref 11.5–15.5)
WBC: 6.5 10*3/uL (ref 4.0–10.5)
nRBC: 0 % (ref 0.0–0.2)

## 2019-07-30 LAB — PHOSPHORUS: Phosphorus: 2.8 mg/dL (ref 2.5–4.6)

## 2019-07-30 LAB — URINE CULTURE: Culture: <10000 — AB

## 2019-07-30 LAB — DIC (DISSEMINATED INTRAVASCULAR COAGULATION)PANEL
D-Dimer, Quant: 7.02 ug/mL-FEU — ABNORMAL HIGH (ref 0.00–0.50)
Fibrinogen: 730 mg/dL — ABNORMAL HIGH (ref 210–475)
INR: 1.2 (ref 0.8–1.2)
Platelets: 68 10*3/uL — ABNORMAL LOW (ref 150–400)
Prothrombin Time: 15.2 seconds (ref 11.4–15.2)
Smear Review: NONE SEEN
aPTT: 29 seconds (ref 24–36)

## 2019-07-30 LAB — ECHOCARDIOGRAM COMPLETE
Height: 61 in
Weight: 2713.6 oz

## 2019-07-30 LAB — MAGNESIUM: Magnesium: 2.4 mg/dL (ref 1.7–2.4)

## 2019-07-30 MED ORDER — METOPROLOL TARTRATE 25 MG PO TABS
25.0000 mg | ORAL_TABLET | Freq: Two times a day (BID) | ORAL | Status: DC
  Administered 2019-07-30 – 2019-08-01 (×5): 25 mg via ORAL
  Filled 2019-07-30 (×5): qty 1

## 2019-07-30 MED ORDER — POTASSIUM CHLORIDE CRYS ER 20 MEQ PO TBCR
40.0000 meq | EXTENDED_RELEASE_TABLET | Freq: Three times a day (TID) | ORAL | Status: DC
  Administered 2019-07-30 (×4): 40 meq via ORAL
  Filled 2019-07-30 (×4): qty 2

## 2019-07-30 NOTE — Progress Notes (Signed)
  Echocardiogram 2D Echocardiogram has been performed.  Alexis Murphy 07/30/2019, 10:47 AM

## 2019-07-30 NOTE — Progress Notes (Addendum)
Patient converted to sinus rhythm HR in the 70's.  MD made aware. Verbal order to turn of cardizem drip.

## 2019-07-30 NOTE — Progress Notes (Signed)
Pt in SR, converted back - we stopped her dilt and amiodarone and can continue her lopressor.  EKG pending.  Dr. Debara Pickett aware

## 2019-07-30 NOTE — Progress Notes (Signed)
Pt in A Fib with a rate of 120-140 throughout the night despite Cardizem and amiodarone infusion. Pt continues to be asymptomatic.

## 2019-07-30 NOTE — Progress Notes (Signed)
Physical Therapy Treatment Patient Details Name: Alexis Murphy MRN: [id number] DOB: Dec 05, 1945 Today's Date: 07/30/2019    History of Present Illness 74 yo female admitted with Pna, weakness. hx of sarcoidosis, COVID 19, OA    PT Comments    Pt demonstrating good improvement today.  Improved balance and HR.  HR 84-93 bpm rest and up to 114 bpm with walking 75' without AD and min guard for safety.  Pt oriented and following all commands. Declined further activity due to "not wanting to push it with HR now controlled" and dinner arrived.   Follow Up Recommendations  Home health PT;Supervision/Assistance - 24 hour     Equipment Recommendations  Rolling walker with 5" wheels    Recommendations for Other Services       Precautions / Restrictions Precautions Precautions: Fall Precaution Comments: monitor HR    Mobility  Bed Mobility   Bed Mobility: Supine to Sit     Supine to sit: Min assist     General bed mobility comments: Assist for trunk  Transfers Overall transfer level: Needs assistance Equipment used: None Transfers: Sit to/from Stand Sit to Stand: Min guard Stand pivot transfers: Supervision          Ambulation/Gait Ambulation/Gait assistance: Min guard Gait Distance (Feet): 75 Feet Assistive device: None Gait Pattern/deviations: Step-through pattern Gait velocity: decreased   General Gait Details: Steady, no dyspnea today; HR up to 114 bpm; cues for navigating around objects in room   Stairs             Wheelchair Mobility    Modified Rankin (Stroke Patients Only)       Balance Overall balance assessment: Needs assistance Sitting-balance support: No upper extremity supported;Feet unsupported Sitting balance-Leahy Scale: Good     Standing balance support: No upper extremity supported Standing balance-Leahy Scale: Fair                              Cognition Arousal/Alertness: Awake/alert Behavior During Therapy: WFL  for tasks assessed/performed Overall Cognitive Status: Within Functional Limits for tasks assessed                                        Exercises      General Comments General comments (skin integrity, edema, etc.): HR 84-92 bpm rest and up to 114 bpm with walking      Pertinent Vitals/Pain Pain Assessment: No/denies pain    Home Living                      Prior Function            PT Goals (current goals can now be found in the care plan section) Progress towards PT goals: Progressing toward goals    Frequency    Min 3X/week      PT Plan Current plan remains appropriate    Co-evaluation              AM-PAC PT "6 Clicks" Mobility   Outcome Measure  Help needed turning from your back to your side while in a flat bed without using bedrails?: None Help needed moving from lying on your back to sitting on the side of a flat bed without using bedrails?: A Little Help needed moving to and from a bed to a chair (including a wheelchair)?: None  Help needed standing up from a chair using your arms (e.g., wheelchair or bedside chair)?: None Help needed to walk in hospital room?: None Help needed climbing 3-5 steps with a railing? : A Little 6 Click Score: 22    End of Session Equipment Utilized During Treatment: Gait belt Activity Tolerance: Patient tolerated treatment well Patient left: in chair;with call bell/phone within reach(verbalized would call out for assist) Nurse Communication: Mobility status PT Visit Diagnosis: Difficulty in walking, not elsewhere classified (R26.2);Muscle weakness (generalized) (M62.81)     Time: 1725-1740 PT Time Calculation (min) (ACUTE ONLY): 15 min  Charges:  $Gait Training: 8-22 mins                     Maggie Font, PT Acute Rehab Services Pager [phone] [facility] [phone] [facility] [place] 07/30/2019, 5:52 PM

## 2019-07-30 NOTE — Consult Note (Signed)
Cardiology Consultation:   Patient ID: [name] MRN: [id number]; DOB: 1946-03-18  Admit date: 07/26/2019 Date of Consult: 07/30/2019  Primary Care Provider: Lawerance Cruel, MD Primary Cardiologist: No primary care provider on file. new Primary Electrophysiologist:  None    Patient Profile:   Alexis Murphy is a 74 y.o. female with a hx of sarcoidosis on methotrexate and hydroxyurea, HTN, HLD, s/p COVID 03/2019 and admitted 07/26/19 with abd pain, vomiting, weakness, diarrhea  who is being seen today for the evaluation of atrial fib at the request of Dr. Sloan Leiter.  History of Present Illness:   [name] with above hx and no cardiac hx per pt, though has had palpitations described as rapid HR last a second or so.  She presented to ER on the 17th with N/V/D, SOB and abd discomfort.   Pt was in ST with PACs on admit.  PCXR with new reticulonodular opacity in Rt mid lung may be PNA, aortic atherosclerosis.   On CT of abd pelvis she was noted to multivessel coronary vascular calcifications noted.  Her k+ was 2.8 Mg+ 1.8 Troponin 28, CRP 28.4  Cr 2.54 and normally 1.26.   WBC 16.9 Hgb 10.3, plts 134  ddimer 8.64 fibrinogen >800  COVID neg.  Admitted her K+ was replaced.  Dx with sepsis, and on IV levofloxacin.  IV fluids given as well.   Blood cultures + for ecoli  ID has seen, felt due to GI source.  Antibiotic switched.   Echo attempted but HR too fast.  She went into a fib 07/28/18 and rec'd IV dilt 20 mg bolus and and drip.  Also metoprolol 10 mg IV given.  Anticoagulation held.  Amlodipine held.   She developed thrombocytopenia and leukocytopenia related to acute bacterial sepsis. But Heparin stopped.   By 9628 yesterday still rapid and amiodarone started.   She remained in 120-140s all night but asymptomatic.   Today  Na 138 K+ 3.4 BUN 30 Cr 1.66 Ca+ 7.7 Mg+ 2.4 Hgb 8.3 Hct 26.7 WBC 6.5 plts 69   TSH 1.914  EKG:  The EKG was personally reviewed and demonstrates:  ST with PACs  no acute ST changes  Follow up on the 20th a fib rate 185 and non specific ST changes due to rate.  Telemetry:  Telemetry was personally reviewed and demonstrates:  A fib RVR since yesterday.   Currently BP 134/83 P 149 afebrile  A fib.   I&O + 5658. No complaints and no awareness of rapid HR.    Heart Pathway Score:     Past Medical History:  Diagnosis Date  . Arthritis   . Hyperlipidemia   . Sarcoidosis     Past Surgical History:  Procedure Laterality Date  . ABDOMINAL HYSTERECTOMY    . BREAST BIOPSY    . BREAST SURGERY    . REDUCTION MAMMAPLASTY       Home Medications:  Prior to Admission medications   Medication Sig Start Date End Date Taking? Authorizing Provider  acetaminophen (TYLENOL) 325 MG tablet Take 1 tablet (325 mg total) by mouth every 6 (six) hours. Patient taking differently: Take 325 mg by mouth every 6 (six) hours as needed for headache.  03/10/17  Yes Caccavale, Sophia, PA-C  amLODipine (NORVASC) 5 MG tablet Take 5 mg by mouth every evening.  08/27/16  Yes [provider]  aspirin EC 81 MG tablet Take 81 mg by mouth daily.   Yes [provider]  atorvastatin (LIPITOR) 40  MG tablet Take 40 mg by mouth every evening.  08/27/16  Yes [provider]  folic acid (FOLVITE) 1 MG tablet Take 1 mg by mouth daily.   Yes [provider]  hydroxyurea (HYDREA) 500 MG capsule Take 1 capsule (500 mg total) by mouth daily. May take with food to minimize GI side effects. Patient taking differently: Take 500 mg by mouth once a week. May take with food to minimize GI side effects. 05/19/19  Yes Brunetta Genera, MD  Melatonin 5 MG TABS Take 5 mg by mouth at bedtime.   Yes [provider]  methotrexate (RHEUMATREX) 2.5 MG tablet Take 15 mg by mouth once a week.  05/01/18  Yes [provider]  omeprazole (PRILOSEC) 40 MG capsule Take 40 mg by mouth every evening.    Yes [provider]  predniSONE (DELTASONE) 5 MG  tablet Take 5 mg by mouth daily.   Yes [provider]  venlafaxine XR (EFFEXOR-XR) 150 MG 24 hr capsule Take 150 mg by mouth every evening.  08/27/16  Yes [provider]    Inpatient Medications: Scheduled Meds: . aspirin EC  81 mg Oral Daily  . atorvastatin  40 mg Oral QPM  . folic acid  1 mg Oral Daily  . Melatonin  5 mg Oral QHS  . pantoprazole  40 mg Oral Daily  . potassium chloride  40 mEq Oral TID  . predniSONE  5 mg Oral Daily  . venlafaxine XR  150 mg Oral QPM   Continuous Infusions: . amiodarone 30 mg/hr (07/30/19 0600)  . diltiazem (CARDIZEM) infusion 15 mg/hr (07/30/19 0701)  . meropenem (MERREM) IV Stopped (07/30/19 0040)   PRN Meds: hydrALAZINE  Allergies:    Allergies  Allergen Reactions  . Penicillins Anaphylaxis and Rash    Did it involve swelling of the face/tongue/throat, SOB, or low BP? Yes  Did it involve sudden or severe rash/hives, skin peeling, or any reaction on the inside of your mouth or nose? No Did you need to seek medical attention at a hospital or doctor's office? No When did it last happen? Childhood If all above answers are "NO", may proceed with cephalosporin use.   . Codeine Other (See Comments)    Vomiting     Social History:   Social History   Socioeconomic History  . Marital status: Widowed    Spouse name: Not on file  . Number of children: Not on file  . Years of education: Not on file  . Highest education level: Not on file  Occupational History  . Not on file  Tobacco Use  . Smoking status: Former Smoker    Packs/day: 1.00    Years: 45.00    Pack years: 45.00    Types: Cigarettes    Quit date: 10/15/2007    Years since quitting: 11.7  . Smokeless tobacco: Never Used  Substance and Sexual Activity  . Alcohol use: Yes  . Drug use: No  . Sexual activity: Not on file  Other Topics Concern  . Not on file  Social History Narrative  . Not on file   Social Determinants of Health   Financial Resource  Strain:   . Difficulty of Paying Living Expenses: Not on file  Food Insecurity:   . Worried About Charity fundraiser in the Last Year: Not on file  . Ran Out of Food in the Last Year: Not on file  Transportation Needs:   . Lack of Transportation (Medical):  Not on file  . Lack of Transportation (Non-Medical): Not on file  Physical Activity:   . Days of Exercise per Week: Not on file  . Minutes of Exercise per Session: Not on file  Stress:   . Feeling of Stress : Not on file  Social Connections:   . Frequency of Communication with Friends and Family: Not on file  . Frequency of Social Gatherings with Friends and Family: Not on file  . Attends Religious Services: Not on file  . Active Member of Clubs or Organizations: Not on file  . Attends Archivist Meetings: Not on file  . Marital Status: Not on file  Intimate Partner Violence:   . Fear of Current or Ex-Partner: Not on file  . Emotionally Abused: Not on file  . Physically Abused: Not on file  . Sexually Abused: Not on file    Family History:    Family History  Problem Relation Age of Onset  . Heart disease Father   . Heart disease Brother      ROS:  Please see the history of present illness.  General:no colds or fevers, no weight changes+ flet terrible on arrival Skin:no rashes or ulcers HEENT:no blurred vision, no congestion CV:see HPI PUL:see HPI GI:+ diarrhea no constipation or melena, no indigestion GU:no hematuria, no dysuria MS:hx arthritis , no claudication Neuro:no syncope, no lightheadedness Endo:no diabetes, no thyroid disease  All other ROS reviewed and negative.     Physical Exam/Data:   Vitals:   07/29/19 2041 07/29/19 2133 07/30/19 0159 07/30/19 0633  BP: 127/84 124/84 127/77 134/83  Pulse: (!) 158 (!) 159 (!) 129 (!) 149  Resp:  18 18 20   Temp:  98.8 F (37.1 C) 97.9 F (36.6 C) 98.4 F (36.9 C)  TempSrc:  Oral Oral Oral  SpO2: 99% 97% 98% 97%  Weight:      Height:         Intake/Output Summary (Last 24 hours) at 07/30/2019 0801 Last data filed at 07/30/2019 0600 Gross per 24 hour  Intake 2249.28 ml  Output 100 ml  Net 2149.28 ml   Last 3 Weights 07/27/2019 07/26/2019 06/29/2019  Weight (lbs) 169 lb 9.6 oz 152 lb 164 lb 4.8 oz  Weight (kg) 76.93 kg 68.947 kg 74.526 kg     Body mass index is 32.05 kg/m.  General:  Well nourished, well developed, in no acute distress HEENT: normal Lymph: no adenopathy Neck: no JVD Endocrine:  No thryomegaly Vascular: No carotid bruits; pedal pulses 2+ bilaterally   Cardiac:  irreg irreg;  Rapid no murmur gallup rub or click Lungs:  clear to auscultation bilaterally, occ wheezing, no rhonchi or rales  Abd: soft, nontender, no hepatomegaly  Ext: no edema Musculoskeletal:  No deformities, BUE and BLE strength normal and equal Skin: warm and dry  Neuro:  Alert and oriented X 3 MAE follows commands, no focal abnormalities noted Psych:  Normal affect    Relevant CV Studies: Echo pending  Laboratory Data:  High Sensitivity Troponin:   Recent Labs  Lab 07/26/19 1719  TROPONINIHS 28*     Chemistry Recent Labs  Lab 07/28/19 0433 07/29/19 0341 07/30/19 0454  NA 141 139 138  K 3.7 3.4* 3.4*  CL 115* 111 110  CO2 17* 17* 17*  GLUCOSE 111* 93 92  BUN 34* 30* 30*  CREATININE 1.67* 1.52* 1.66*  CALCIUM 8.2* 8.0* 7.7*  GFRNONAA 30* 34* 30*  GFRAA 35* 39* 35*  ANIONGAP 9 11 11  Recent Labs  Lab 07/26/19 1632 07/27/19 0547 07/29/19 0341  PROT 6.3* 5.7* 5.7*  ALBUMIN 3.5 2.8* 2.7*  AST 21 16 17   ALT 17 17 18   ALKPHOS 114 99 75  BILITOT 1.3* 1.1 1.3*   Hematology Recent Labs  Lab 07/28/19 0433 07/29/19 0341 07/30/19 0454  WBC 7.3 3.4* 6.5  RBC 2.55* 2.58* 2.30*  HGB 9.2* 9.0* 8.3*  HCT 28.2* 28.5* 26.7*  MCV 110.6* 110.5* 116.1*  MCH 36.1* 34.9* 36.1*  MCHC 32.6 31.6 31.1  RDW 15.1 15.4 15.8*  PLT 84* 73* 69*   BNPNo results for input(s): BNP, PROBNP in the last 168 hours.  DDimer   Recent Labs  Lab 07/26/19 1719  DDIMER 8.64*     Radiology/Studies:  DG Chest Port 1 View  Result Date: 07/26/2019 CLINICAL DATA:  Shortness of breath, vomiting EXAM: PORTABLE CHEST 1 VIEW COMPARISON:  Radiograph 10/29/2017, CT 07/17/2018 FINDINGS: Slight left anterior obliquity accentuating the cardiomediastinal silhouette and superimposing the mediastinum over the right lung. Coarsened interstitial changes are similar to comparison is. Some new reticulonodular opacity seen in the right mid lung. No pneumothorax. No effusion. The aorta is calcified. The remaining cardiomediastinal contours are unremarkable accounting for technique and rotation. No acute osseous or soft tissue abnormality. Degenerative changes are present in the imaged spine and shoulders. IMPRESSION: 1. New reticulonodular opacity seen in the right mid lung which may represent pneumonia. Recommend follow-up imaging to resolution. 2. Stable chronic interstitial changes. 3.  Aortic Atherosclerosis (ICD10-I70.0). Electronically Signed   By: Lovena Le M.D.   On: 07/26/2019 17:23   CT Renal Stone Study  Result Date: 07/26/2019 CLINICAL DATA:  74 year old female with abdominal discomfort, nausea vomiting. Flank pain. EXAM: CT ABDOMEN AND PELVIS WITHOUT CONTRAST TECHNIQUE: Multidetector CT imaging of the abdomen and pelvis was performed following the standard protocol without IV contrast. COMPARISON:  CT abdomen pelvis dated 07/17/2018. FINDINGS: Evaluation of this exam is limited in the absence of intravenous contrast. Lower chest: The visualized lung bases are clear. Multi vessel coronary vascular calcification noted. No intra-abdominal free air or free fluid. Hepatobiliary: Mild fatty infiltration of the liver. No intrahepatic biliary ductal dilatation. Several stones noted within the gallbladder. No pericholecystic fluid or evidence of acute cholecystitis by CT. Pancreas: Unremarkable. No pancreatic ductal dilatation or surrounding  inflammatory changes. Spleen: Normal in size without focal abnormality. Adrenals/Urinary Tract: A 17 mm left adrenal nodule, likely an adenoma. The right adrenal gland is unremarkable. There is no hydronephrosis or nephrolithiasis on either side. A 2 cm right renal inferior pole hypodense lesion is not characterized on this noncontrast CT but appears similar to prior CT. Mild bilateral perinephric stranding, nonspecific. Correlation with urinalysis recommended to evaluate for UTI. The urinary bladder is grossly unremarkable. Stomach/Bowel: There is a small hiatal hernia. Scattered colonic diverticula without active inflammatory changes. There is no bowel obstruction or active inflammation. The appendix is normal. Vascular/Lymphatic: Advanced aortoiliac atherosclerotic disease. The IVC is unremarkable. No portal venous gas. There is no adenopathy. Reproductive: Hysterectomy.  No adnexal masses. Other: None Musculoskeletal: Osteopenia with multilevel degenerative changes of the spine with disc desiccation and vacuum phenomena. No acute osseous pathology. IMPRESSION: 1. No hydronephrosis or nephrolithiasis. Correlation with urinalysis recommended to evaluate for UTI. 2. Cholelithiasis. 3. No bowel obstruction or active inflammation.  Normal appendix. 4. Scattered colonic diverticula without active inflammatory changes. 5. Coronary vascular calcification and Aortic Atherosclerosis (ICD10-I70.0). Electronically Signed   By: Anner Crete M.D.   On: 07/26/2019 19:08  No chest pain, atrial fib Assessment and Plan:   1. Atrial fib with RVR in combination with PNA, bacteremia and sepsis.  On IV dilt at 15 and IV amiodarone 30 cc hr. Still with elevated HR. Will add lopressor 25 BID po now, may need increase of amiodarone or bolus  Not on anticoagulation due to thrombocytopenia.  No bleeding.  cha2DS2VASc of 3 with HTN age and female 2.  HTN stable will tolerate BB 3. AKI improving  Cr  2.54>>2.04>>1.67>>1.52>> 1.66 4. Hypokalemia on admit and yesterday and today - replaced 5. Thrombocytopenia Heparin stopped, plts at 69k today, leukopenia resolved.  6. Sarcoidosis on steroids and methotrexate.      For questions or updates, please contact [place] Please consult [email or web address] for contact info under     Signed, Cecilie Kicks, NP  07/30/2019 8:01 AM

## 2019-07-30 NOTE — Progress Notes (Signed)
PROGRESS NOTE    Alexis Murphy  AJO:[id number] DOB: [birth year] DOA: 07/26/2019 PCP: Lawerance Cruel, MD    Brief Narrative:  74 year old female with history of hypertension, hyperlipidemia, sarcoidosis on prednisone, hydroxyurea and methotrexate and arthritis presented to the emergency room with generalized weakness, shortness of breath nausea and vomiting.  Patient was diagnosed with COVID-19 infection with similar symptoms and September 2020.  In the emergency room, her blood pressures were stable.  Heart rate 117.  WBC 16.9.  Hypokalemia.  Creatinine 2.5.  Lactic acid 2.  Procalcitonin 134.  Chest x-ray showed reticulonodular opacity in the right lung.  Treated with IV fluids, Decadron and admitted with IV antibiotics.   Assessment & Plan:   Principal Problem:   Bacteremia due to Gram-negative bacteria Active Problems:   Pneumonia   Hypokalemia   AKI (acute kidney injury) ([place])   Sepsis ([place])   New onset a-fib ([facility])  Sepsis present on admission secondary to ESBL E. coli bacteremia: Primary source unknown.  Probably urinary source.  Previously on aztreonam, changed to meropenem. Urine culture not available from admission.  Repeat cultures no growth so far.  Repeat blood cultures no growth so far. CT scan of the abdomen pelvis with no evidence of abscesses or secondary cause for infection, likely perinephric stranding. Chest x-ray shows reticulo nodular opacity that is consistent with history of sarcoidosis.  No evidence of pneumonia. Appreciate infectious disease help for antibiotic management.  New onset A. fib with RVR: Probably related to sepsis.  Difficult to control heart rate. Cardizem bolus and infusion  Amiodarone bolus and infusion with suboptimal control  Added metoprolol.  TSH normal.  Aggressive electrolyte replacement.  Echocardiogram today.   Followed by cardiology.   Holding off on anticoagulation given decreasing platelets.    Acute kidney injury with  history of chronic kidney disease stage IIIa: Known baseline creatinine about 1.26.  Treated with IV fluids with gradual clinical improvement.  We will continue close monitoring.  Hypertension: Hold amlodipine. beta-blockers and Cardizem.  Hypokalemia: Replaced aggressively.  Replace magnesium and phosphorus.  Recheck in the morning.  Sarcoidosis: Followed by oncology outpatient.  Currently on maintenance steroids and methotrexate that is continued.  I will notify her oncologist.  Thrombocytopenia/leukocytopenia: Probably related to acute bacterial sepsis.  We will check heparin antibodies.  Heparin discontinued. Check DIC panel.  Close monitoring.  Currently no indication for blood or platelet transfusions.  DVT prophylaxis: SCDs. Code Status: Full code Family Communication: Patient's son at the bedside. Disposition Plan: Unknown at this time.  Anticipate home after clinical improvement.   Consultants:   Cardiology  Infectious disease  Procedures:   None  Antimicrobials:  Anti-infectives (From admission, onward)   Start     Dose/Rate Route Frequency Ordered Stop   07/30/19 0000  meropenem (MERREM) 1 g in sodium chloride 0.9 % 100 mL IVPB     1 g 200 mL/hr over 30 Minutes Intravenous Every 12 hours 07/29/19 1414     07/29/19 1400  meropenem (MERREM) 1 g in sodium chloride 0.9 % 100 mL IVPB     1 g 200 mL/hr over 30 Minutes Intravenous STAT 07/29/19 1347 07/29/19 2027   07/28/19 2200  levofloxacin (LEVAQUIN) IVPB 750 mg  Status:  Discontinued     750 mg 100 mL/hr over 90 Minutes Intravenous Every 48 hours 07/27/19 0356 07/27/19 1531   07/27/19 1600  aztreonam (AZACTAM) 1 g in sodium chloride 0.9 % 100 mL IVPB  Status:  Discontinued  1 g 200 mL/hr over 30 Minutes Intravenous Every 8 hours 07/27/19 1532 07/29/19 1345   07/26/19 1930  levofloxacin (LEVAQUIN) IVPB 750 mg     750 mg 100 mL/hr over 90 Minutes Intravenous  Once 07/26/19 1928 07/26/19 2311          Subjective: Patient seen and examined.  Patient herself has no complaints.  All night, she had heart rate 130-150 on maximum doses of Cardizem and amiodarone.  Denies any cough.  Denies any nausea vomiting.  Denies any chest pain or palpitations.  Objective: Vitals:   07/30/19 0633 07/30/19 1003 07/30/19 1117 07/30/19 1304  BP: 134/83 (!) 119/92 105/63 106/74  Pulse: (!) 149 (!) 139 93 (!) 105  Resp: 20 20  20   Temp: 98.4 F (36.9 C)   98.2 F (36.8 C)  TempSrc: Oral   Oral  SpO2: 97% 98%  98%  Weight:      Height:        Intake/Output Summary (Last 24 hours) at 07/30/2019 1313 Last data filed at 07/30/2019 1000 Gross per 24 hour  Intake 2369.28 ml  Output --  Net 2369.28 ml   Filed Weights   07/26/19 2324 07/27/19 2227  Weight: 68.9 kg 76.9 kg    Examination:  General exam: Appears calm and comfortable, on room air. Respiratory system: Clear to auscultation. Respiratory effort normal.  No added sounds. Cardiovascular system: S1 & S2 heard, irregularly irregular and tachycardic.  No JVD, murmurs, rubs, gallops or clicks. No pedal edema. Gastrointestinal system: Abdomen is nondistended, soft and nontender. No organomegaly or masses felt. Normal bowel sounds heard. Central nervous system: Alert and oriented. No focal neurological deficits. Extremities: Symmetric 5 x 5 power. Skin: No rashes, lesions or ulcers Psychiatry: Judgement and insight appear normal. Mood & affect appropriate.     Data Reviewed: I have personally reviewed following labs and imaging studies  CBC: Recent Labs  Lab 07/26/19 1758 07/27/19 0547 07/28/19 0433 07/29/19 0341 07/30/19 0454  WBC 15.2* 11.3* 7.3 3.4* 6.5  NEUTROABS  --   --  6.4  --  5.8  HGB 9.7* 9.3* 9.2* 9.0* 8.3*  HCT 28.5* 28.0* 28.2* 28.5* 26.7*  MCV 106.7* 107.7* 110.6* 110.5* 116.1*  PLT 119* 90* 84* 73* 69*   Basic Metabolic Panel: Recent Labs  Lab 07/26/19 1632 07/26/19 1632 07/26/19 1758 07/27/19 0547  07/28/19 0433 07/29/19 0341 07/29/19 0804 07/30/19 0454  NA 129*  --   --  137 141 139  --  138  K 2.8*  --   --  2.8* 3.7 3.4*  --  3.4*  CL 93*  --   --  107 115* 111  --  110  CO2 22  --   --  19* 17* 17*  --  17*  GLUCOSE 114*  --   --  129* 111* 93  --  92  BUN 40*  --   --  33* 34* 30*  --  30*  CREATININE 2.54*   < > 2.55* 2.04* 1.67* 1.52*  --  1.66*  CALCIUM 8.2*  --   --  8.0* 8.2* 8.0*  --  7.7*  MG  --   --   --   --   --   --  1.8 2.4  PHOS  --   --   --   --   --   --  3.3 2.8   < > = values in this interval not displayed.   GFR: Estimated  Creatinine Clearance: 28.7 mL/min (A) (by C-G formula based on SCr of 1.66 mg/dL (H)). Liver Function Tests: Recent Labs  Lab 07/26/19 1632 07/27/19 0547 07/29/19 0341  AST 21 16 17   ALT 17 17 18   ALKPHOS 114 99 75  BILITOT 1.3* 1.1 1.3*  PROT 6.3* 5.7* 5.7*  ALBUMIN 3.5 2.8* 2.7*   Recent Labs  Lab 07/26/19 1632  LIPASE 15   No results for input(s): AMMONIA in the last 168 hours. Coagulation Profile: No results for input(s): INR, PROTIME in the last 168 hours. Cardiac Enzymes: No results for input(s): CKTOTAL, CKMB, CKMBINDEX, TROPONINI in the last 168 hours. BNP (last 3 results) No results for input(s): PROBNP in the last 8760 hours. HbA1C: No results for input(s): HGBA1C in the last 72 hours. CBG: No results for input(s): GLUCAP in the last 168 hours. Lipid Profile: No results for input(s): CHOL, HDL, LDLCALC, TRIG, CHOLHDL, LDLDIRECT in the last 72 hours. Thyroid Function Tests: Recent Labs    07/29/19 1509  TSH 1.914   Anemia Panel: No results for input(s): VITAMINB12, FOLATE, FERRITIN, TIBC, IRON, RETICCTPCT in the last 72 hours. Sepsis Labs: Recent Labs  Lab 07/26/19 1719 07/26/19 2027  PROCALCITON 134.68  --   LATICACIDVEN 2.0* 1.9    Recent Results (from the past 240 hour(s))  SARS CORONAVIRUS 2 (TAT 6-24 HRS) Nasopharyngeal Nasopharyngeal Swab     Status: None   Collection Time: 07/26/19   5:03 PM   Specimen: Nasopharyngeal Swab  Result Value Ref Range Status   SARS Coronavirus 2 NEGATIVE NEGATIVE Final    Comment: (NOTE) SARS-CoV-2 target nucleic acids are NOT DETECTED. The SARS-CoV-2 RNA is generally detectable in upper and lower respiratory specimens during the acute phase of infection. Negative results do not preclude SARS-CoV-2 infection, do not rule out co-infections with other pathogens, and should not be used as the sole basis for treatment or other patient management decisions. Negative results must be combined with clinical observations, patient history, and epidemiological information. The expected result is Negative. Fact Sheet for Patients: SugarRoll.be Fact Sheet for Healthcare Providers: https://www.woods-mathews.com/ This test is not yet approved or cleared by the [place] FDA and  has been authorized for detection and/or diagnosis of SARS-CoV-2 by FDA under an Emergency Use Authorization (EUA). This EUA will remain  in effect (meaning this test can be used) for the duration of the COVID-19 declaration under Section 56 4(b)(1) of the Act, 21 U.S.C. section 360bbb-3(b)(1), unless the authorization is terminated or revoked sooner. Performed at [facility], [place] [address] [place], [place] [address]   Blood Culture (routine x 2)     Status: Abnormal   Collection Time: 07/26/19  5:19 PM   Specimen: BLOOD LEFT HAND  Result Value Ref Range Status   Specimen Description   Final    BLOOD LEFT HAND Performed at [facility] [address] [place], [place] [address]    Special Requests   Final    BOTTLES DRAWN AEROBIC AND ANAEROBIC Blood Culture adequate volume Performed at [facility] [address], [place], [place] [address]    Culture  Setup Time   Final    GRAM NEGATIVE RODS IN BOTH AEROBIC AND ANAEROBIC BOTTLES CRITICAL RESULT CALLED TO, READ BACK BY AND  VERIFIED WITH: Shelda Jakes PharmD 15:25 07/27/19 ([email or web address]) Performed at [facility], [place] [address], [place], [place] [address]    Culture (A)  Final    ESCHERICHIA COLI Confirmed Extended  Spectrum Beta-Lactamase Producer (ESBL).  In bloodstream infections from ESBL organisms, carbapenems are preferred over piperacillin/tazobactam. They are shown to have a lower risk of mortality.    Report Status 07/29/2019 FINAL  Final   Organism ID, Bacteria ESCHERICHIA COLI  Final      Susceptibility   Escherichia coli - MIC*    AMPICILLIN >=32 RESISTANT Resistant     CEFAZOLIN >=64 RESISTANT Resistant     CEFEPIME 16 RESISTANT Resistant     CEFTAZIDIME RESISTANT Resistant     CEFTRIAXONE >=64 RESISTANT Resistant     CIPROFLOXACIN >=4 RESISTANT Resistant     GENTAMICIN >=16 RESISTANT Resistant     IMIPENEM <=0.25 SENSITIVE Sensitive     TRIMETH/SULFA >=320 RESISTANT Resistant     AMPICILLIN/SULBACTAM >=32 RESISTANT Resistant     PIP/TAZO <=4 SENSITIVE Sensitive     * ESCHERICHIA COLI  Blood Culture ID Panel (Reflexed)     Status: Abnormal   Collection Time: 07/26/19  5:19 PM  Result Value Ref Range Status   Enterococcus species NOT DETECTED NOT DETECTED Final   Listeria monocytogenes NOT DETECTED NOT DETECTED Final   Staphylococcus species NOT DETECTED NOT DETECTED Final   Staphylococcus aureus (BCID) NOT DETECTED NOT DETECTED Final   Streptococcus species NOT DETECTED NOT DETECTED Final   Streptococcus agalactiae NOT DETECTED NOT DETECTED Final   Streptococcus pneumoniae NOT DETECTED NOT DETECTED Final   Streptococcus pyogenes NOT DETECTED NOT DETECTED Final   Acinetobacter baumannii NOT DETECTED NOT DETECTED Final   Enterobacteriaceae species DETECTED (A) NOT DETECTED Final    Comment: Enterobacteriaceae represent a large family of gram-negative bacteria, not a single organism. CRITICAL RESULT CALLED TO, READ BACK BY AND VERIFIED WITH: Shelda Jakes PharmD 15:25 07/27/19 ([email or web address])     Enterobacter cloacae complex NOT DETECTED NOT DETECTED Final   Escherichia coli DETECTED (A) NOT DETECTED Final    Comment: CRITICAL RESULT CALLED TO, READ BACK BY AND VERIFIED WITH: Shelda Jakes PharmD 15:25 07/27/19 ([email or web address])    Klebsiella oxytoca NOT DETECTED NOT DETECTED Final   Klebsiella pneumoniae NOT DETECTED NOT DETECTED Final   Proteus species NOT DETECTED NOT DETECTED Final   Serratia marcescens NOT DETECTED NOT DETECTED Final   Carbapenem resistance NOT DETECTED NOT DETECTED Final   Haemophilus influenzae NOT DETECTED NOT DETECTED Final   Neisseria meningitidis NOT DETECTED NOT DETECTED Final   Pseudomonas aeruginosa NOT DETECTED NOT DETECTED Final   Candida albicans NOT DETECTED NOT DETECTED Final   Candida glabrata NOT DETECTED NOT DETECTED Final   Candida krusei NOT DETECTED NOT DETECTED Final   Candida parapsilosis NOT DETECTED NOT DETECTED Final   Candida tropicalis NOT DETECTED NOT DETECTED Final    Comment: Performed at [facility], [place] [address] [place], [name] [address]  Blood Culture (routine x 2)     Status: Abnormal   Collection Time: 07/26/19  5:24 PM   Specimen: BLOOD RIGHT FOREARM  Result Value Ref Range Status   Specimen Description   Final    BLOOD RIGHT FOREARM Performed at [facility] [address], [place], [place] [address]    Special Requests   Final    BOTTLES DRAWN AEROBIC AND ANAEROBIC Blood Culture adequate volume Performed at [facility] [address], [place],  [address]    Culture  Setup Time   Final    GRAM NEGATIVE RODS IN BOTH AEROBIC AND ANAEROBIC BOTTLES CRITICAL VALUE NOTED.  VALUE IS CONSISTENT WITH PREVIOUSLY REPORTED AND CALLED VALUE.  Culture (A)  Final    ESCHERICHIA COLI SUSCEPTIBILITIES PERFORMED ON PREVIOUS CULTURE WITHIN THE LAST 5 DAYS. Performed at [facility], [place] [address] [place], [place] [address]    Report Status 07/29/2019 FINAL  Final  Culture,  blood (routine x 2)     Status: None (Preliminary result)   Collection Time: 07/29/19  8:04 AM   Specimen: BLOOD  Result Value Ref Range Status   Specimen Description   Final    BLOOD RIGHT THUMB Performed at [facility] [address] [place], [place] [address]    Special Requests   Final    BOTTLES DRAWN AEROBIC ONLY Blood Culture adequate volume Performed at [facility] [address] [place], [place] [address]    Culture  Setup Time   Final    AEROBIC BOTTLE ONLY GRAM NEGATIVE RODS CRITICAL VALUE NOTED.  VALUE IS CONSISTENT WITH PREVIOUSLY REPORTED AND CALLED VALUE. Performed at [facility], [place] [address] [place], [place] [address]    Culture PENDING  Incomplete   Report Status PENDING  Incomplete  Culture, blood (routine x 2)     Status: None (Preliminary result)   Collection Time: 07/29/19  8:04 AM   Specimen: BLOOD RIGHT HAND  Result Value Ref Range Status   Specimen Description   Final    BLOOD RIGHT HAND Performed at [facility] [address] [place], [place] [address]    Special Requests   Final    BOTTLES DRAWN AEROBIC ONLY Blood Culture adequate volume Performed at [facility] [address] [place], [place] [address]    Culture   Final    NO GROWTH < 24 HOURS Performed at [facility] [address], [place], [place] [address]    Report Status PENDING  Incomplete  Culture, Urine     Status: Abnormal   Collection Time: 07/29/19 10:44 AM   Specimen: Urine, Clean Catch  Result Value Ref Range Status   Specimen Description   Final    URINE, CLEAN CATCH Performed at [facility], [place] [address] [place], [place] [address]    Special Requests   Final    NONE Performed at [facility], [place] [address] [place], [place] [address]    Culture (A)  Final    <10,000 COLONIES/mL INSIGNIFICANT GROWTH Performed at [facility] [address], [place], [place] [address]    Report Status 07/30/2019 FINAL  Final         Radiology Studies: No results found.      Scheduled Meds: . aspirin EC  81 mg Oral Daily  . atorvastatin  40 mg Oral QPM  . folic acid  1 mg Oral Daily  . Melatonin  5 mg Oral QHS  . metoprolol tartrate  25 mg Oral BID  . pantoprazole  40 mg Oral Daily  . potassium chloride  40 mEq Oral TID  . predniSONE  5 mg Oral Daily  . venlafaxine XR  150 mg Oral QPM   Continuous Infusions: . amiodarone 30 mg/hr (07/30/19 0600)  . diltiazem (CARDIZEM) infusion 10 mg/hr (07/30/19 1113)  . meropenem (MERREM) IV 1 g (07/30/19 1001)     LOS: 4 days    Time spent: 30 minutes    Barb Merino, MD [facility] Pager [phone]

## 2019-07-30 NOTE — Progress Notes (Signed)
OT Cancellation Note  Patient Details Name: Alexis Murphy MRN: [id number] DOB: [birth year]   Cancelled Treatment:    Reason Eval/Treat Not Completed: Fatigue/lethargy limiting ability to participate  [place] 07/30/2019, 2:09 PM Karsten Ro, OTR/L Acute Rehabilitation Services 07/30/2019

## 2019-07-30 NOTE — Progress Notes (Signed)
PT Cancellation Note  Patient Details Name: Alexis Murphy MRN: [id number] DOB: Oct 28, 1945   Cancelled Treatment:    Reason Eval/Treat Not Completed: Other (comment)  Pt's HR had been in 140's and now low 100's.  Pt was cleared for PT by RN and cardiology with HR monitored, but pt declined because feeling good and doesn't want to elevate HR right now.  Will f/u as able. Encouraged OOB activity with nursing when able.  Maggie Font, PT Acute Rehab Services Pager [phone] [facility] [phone]    Karlton Lemon 07/30/2019, 11:37 AM

## 2019-07-31 ENCOUNTER — Inpatient Hospital Stay: Payer: Self-pay

## 2019-07-31 LAB — COMPREHENSIVE METABOLIC PANEL
ALT: 20 U/L (ref 0–44)
AST: 18 U/L (ref 15–41)
Albumin: 2.5 g/dL — ABNORMAL LOW (ref 3.5–5.0)
Alkaline Phosphatase: 93 U/L (ref 38–126)
Anion gap: 8 (ref 5–15)
BUN: 31 mg/dL — ABNORMAL HIGH (ref 8–23)
CO2: 16 mmol/L — ABNORMAL LOW (ref 22–32)
Calcium: 8 mg/dL — ABNORMAL LOW (ref 8.9–10.3)
Chloride: 116 mmol/L — ABNORMAL HIGH (ref 98–111)
Creatinine, Ser: 1.55 mg/dL — ABNORMAL HIGH (ref 0.44–1.00)
GFR calc Af Amer: 38 mL/min — ABNORMAL LOW (ref >60)
GFR calc non Af Amer: 33 mL/min — ABNORMAL LOW (ref >60)
Glucose, Bld: 76 mg/dL (ref 70–99)
Potassium: 5.2 mmol/L — ABNORMAL HIGH (ref 3.5–5.1)
Sodium: 140 mmol/L (ref 135–145)
Total Bilirubin: 0.7 mg/dL (ref 0.3–1.2)
Total Protein: 5.7 g/dL — ABNORMAL LOW (ref 6.5–8.1)

## 2019-07-31 LAB — CBC WITH DIFFERENTIAL/PLATELET
Abs Immature Granulocytes: 0.12 10*3/uL — ABNORMAL HIGH (ref 0.00–0.07)
Basophils Absolute: 0 10*3/uL (ref 0.0–0.1)
Basophils Relative: 0 %
Eosinophils Absolute: 0 10*3/uL (ref 0.0–0.5)
Eosinophils Relative: 0 %
HCT: 29.6 % — ABNORMAL LOW (ref 36.0–46.0)
Hemoglobin: 9.2 g/dL — ABNORMAL LOW (ref 12.0–15.0)
Immature Granulocytes: 1 %
Lymphocytes Relative: 4 %
Lymphs Abs: 0.4 10*3/uL — ABNORMAL LOW (ref 0.7–4.0)
MCH: 36.1 pg — ABNORMAL HIGH (ref 26.0–34.0)
MCHC: 31.1 g/dL (ref 30.0–36.0)
MCV: 116.1 fL — ABNORMAL HIGH (ref 80.0–100.0)
Monocytes Absolute: 0.3 10*3/uL (ref 0.1–1.0)
Monocytes Relative: 3 %
Neutro Abs: 8.7 10*3/uL — ABNORMAL HIGH (ref 1.7–7.7)
Neutrophils Relative %: 92 %
Platelets: 73 10*3/uL — ABNORMAL LOW (ref 150–400)
RBC: 2.55 MIL/uL — ABNORMAL LOW (ref 3.87–5.11)
RDW: 15.9 % — ABNORMAL HIGH (ref 11.5–15.5)
WBC: 9.6 10*3/uL (ref 4.0–10.5)
nRBC: 0 % (ref 0.0–0.2)

## 2019-07-31 LAB — PHOSPHORUS: Phosphorus: 2.9 mg/dL (ref 2.5–4.6)

## 2019-07-31 LAB — HEPARIN INDUCED PLATELET AB (HIT ANTIBODY): Heparin Induced Plt Ab: 0.065 OD (ref 0.000–0.400)

## 2019-07-31 LAB — MAGNESIUM: Magnesium: 2.4 mg/dL (ref 1.7–2.4)

## 2019-07-31 MED ORDER — SODIUM CHLORIDE 0.9 % IV SOLN
1.0000 g | INTRAVENOUS | Status: DC
  Administered 2019-08-01: 1000 mg via INTRAVENOUS
  Filled 2019-07-31: qty 1

## 2019-07-31 MED ORDER — FOLIC ACID 1 MG PO TABS
5.0000 mg | ORAL_TABLET | Freq: Every day | ORAL | Status: DC
  Administered 2019-07-31 – 2019-08-01 (×2): 5 mg via ORAL
  Filled 2019-07-31 (×2): qty 5

## 2019-07-31 NOTE — Progress Notes (Signed)
[facility] for Infectious Disease    Date of Admission:  07/26/2019   Total days of antibiotics 6        Day 3 meropenem           ID: Alexis Murphy is a 74 y.o. female with ecoli bacteremia Principal Problem:   Bacteremia due to Gram-negative bacteria Active Problems:   Pneumonia   Hypokalemia   AKI (acute kidney injury) ([place])   Sepsis ([place])   New onset a-fib (H[facility])    Subjective: Afebrile, back in sinus rhythm  Labs show improvement back to baseline, no longer leukopenic Medications:  . aspirin EC  81 mg Oral Daily  . atorvastatin  40 mg Oral QPM  . folic acid  5 mg Oral Daily  . Melatonin  5 mg Oral QHS  . metoprolol tartrate  25 mg Oral BID  . pantoprazole  40 mg Oral Daily  . predniSONE  5 mg Oral Daily  . venlafaxine XR  150 mg Oral QPM    Objective: Vital signs in last 24 hours: Temp:  [97.8 F (36.6 C)-98.3 F (36.8 C)] 97.8 F (36.6 C) (01/22 0445) Pulse Rate:  [93-139] 102 (01/22 0445) Resp:  [20] 20 (01/22 0445) BP: (105-138)/(63-92) 138/78 (01/22 0445) SpO2:  [98 %-100 %] 99 % (01/22 0445)  Physical Exam  Constitutional:  oriented to person, place, and time. appears well-developed and well-nourished. No distress.  HENT: [place]/AT, PERRLA, no scleral icterus Mouth/Throat: Oropharynx is clear and moist. No oropharyngeal exudate.  Cardiovascular: Normal rate, regular rhythm and normal heart sounds. Exam reveals no gallop and no friction rub.  No murmur heard.  Pulmonary/Chest: Effort normal and breath sounds normal. No respiratory distress.  has no wheezes.  Neck = supple, no nuchal rigidity Abdominal: Soft. Bowel sounds are normal.  exhibits no distension. There is no tenderness.  Lymphadenopathy: no cervical adenopathy. No axillary adenopathy Neurological: alert and oriented to person, place, and time.  Skin: Skin is warm and dry. No rash noted. No erythema.  Psychiatric: a normal mood and affect.  behavior is normal.    Lab Results Recent  Labs    07/30/19 0454 07/31/19 0511  WBC 6.5 9.6  HGB 8.3* 9.2*  HCT 26.7* 29.6*  NA 138 140  K 3.4* 5.2*  CL 110 116*  CO2 17* 16*  BUN 30* 31*  CREATININE 1.66* 1.55*   Liver Panel Recent Labs    07/29/19 0341 07/31/19 0511  PROT 5.7* 5.7*  ALBUMIN 2.7* 2.5*  AST 17 18  ALT 18 20  ALKPHOS 75 93  BILITOT 1.3* 0.7    Microbiology: 1/20 blood cx GNR 1/17 blood cx ESBL ecoli  Studies/Results: ECHOCARDIOGRAM COMPLETE  Result Date: 07/30/2019   ECHOCARDIOGRAM REPORT   Patient Name:   Alexis Murphy Date of Exam: 07/30/2019 Medical Rec #:  [id number]       Height:       61.0 in Accession #:    [id number]      Weight:       169.6 lb Date of Birth:  1946/01/03       BSA:          1.76 m Patient Age:    60 years        BP:           119/92 mmHg Patient Gender: F               HR:  132 bpm. Exam Location:  Inpatient Procedure: 2D Echo, Cardiac Doppler and Color Doppler Indications:    Abnormal EKG  History:        Patient has no prior history of Echocardiogram examinations.                 Arrythmias:Atrial Fibrillation; Risk Factors:Hypertension and                 Dyslipidemia. Sepsis.  Sonographer:    Dustin Flock Referring Phys: [id number] [facility]  Sonographer Comments: Suboptimal parasternal window. Image acquisition challenging due to patient body habitus. IMPRESSIONS  1. Left ventricular ejection fraction, by visual estimation, is 55 to 60%. The left ventricle has normal function. Moderately increased left ventricular posterior wall thickness. There is no left ventricular hypertrophy.  2. Left ventricular diastolic parameters are indeterminate.  3. The left ventricle has no regional wall motion abnormalities.  4. Global right ventricle has normal systolic function.The right ventricular size is normal. No increase in right ventricular wall thickness.  5. Left atrial size was mildly dilated.  6. Right atrial size was normal.  7. The mitral valve is normal in structure.  Mild mitral valve regurgitation.  8. The tricuspid valve is normal in structure. Tricuspid valve regurgitation is not demonstrated.  9. The aortic valve is tricuspid. Aortic valve regurgitation is mild. No evidence of aortic valve sclerosis or stenosis 10. TR signal is inadequate for assessing pulmonary artery systolic pressure. 11. The inferior vena cava is normal in size with <50% respiratory variability, suggesting right atrial pressure of 8 mmHg. 12. The patient was in atrial fibrillation. 13. Technically difficult study with poor acoustic windows. FINDINGS  Left Ventricle: Left ventricular ejection fraction, by visual estimation, is 55 to 60%. The left ventricle has normal function. The left ventricle has no regional wall motion abnormalities. The left ventricular internal cavity size was the left ventricle is normal in size. Moderately increased left ventricular posterior wall thickness. There is no left ventricular hypertrophy. Left ventricular diastolic parameters are indeterminate. Right Ventricle: The right ventricular size is normal. No increase in right ventricular wall thickness. Global RV systolic function is has normal systolic function. Left Atrium: Left atrial size was mildly dilated. Right Atrium: Right atrial size was normal in size Pericardium: There is no evidence of pericardial effusion. Mitral Valve: The mitral valve is normal in structure. Mild mitral valve regurgitation. Tricuspid Valve: The tricuspid valve is normal in structure. Tricuspid valve regurgitation is not demonstrated. Aortic Valve: The aortic valve is tricuspid. Aortic valve regurgitation is mild. Aortic regurgitation PHT measures 664 msec. The aortic valve is structurally normal, with no evidence of sclerosis or stenosis. Pulmonic Valve: The pulmonic valve was normal in structure. Pulmonic valve regurgitation is not visualized. Pulmonic regurgitation is not visualized. Aorta: The aortic root is normal in size and structure.  Venous: The inferior vena cava is normal in size with less than 50% respiratory variability, suggesting right atrial pressure of 8 mmHg. IAS/Shunts: No atrial level shunt detected by color flow Doppler.  LEFT VENTRICLE PLAX 2D LVIDd:         4.40 cm  Diastology LVIDs:         3.10 cm  LV e' lateral:   4.68 cm/s LV PW:         1.10 cm  LV E/e' lateral: 29.9 LV IVS:        0.90 cm  LV e' medial:    6.85 cm/s LVOT diam:     1.50  cm  LV E/e' medial:  20.4 LV SV:         50 ml LV SV Index:   26.89 LVOT Area:     1.77 cm  RIGHT VENTRICLE RV Basal diam:  2.10 cm RV S prime:     5.22 cm/s TAPSE (M-mode): 2.4 cm LEFT ATRIUM             Index       RIGHT ATRIUM           Index LA diam:        3.80 cm 2.16 cm/m  RA Area:     14.70 cm LA Vol (A2C):   46.6 ml 26.46 ml/m RA Volume:   35.40 ml  20.10 ml/m LA Vol (A4C):   52.0 ml 29.53 ml/m LA Biplane Vol: 50.5 ml 28.68 ml/m  AORTIC VALVE LVOT Vmax:   99.80 cm/s LVOT Vmean:  56.500 cm/s LVOT VTI:    0.158 m AI PHT:      664 msec  AORTA Ao Root diam: 2.50 cm MITRAL VALVE MV Area (PHT): 7.59 cm             SHUNTS MV PHT:        29.00 msec           Systemic VTI:  0.16 m MV Decel Time: 100 msec             Systemic Diam: 1.50 cm MV E velocity: 140.00 cm/s 103 cm/s  Loralie Champagne MD Electronically signed by Loralie Champagne MD Signature Date/Time: 07/30/2019/4:34:41 PM    Final      Assessment/Plan: esbl ecoli bacteremia = not surprised that blood cx from 1/20 are still showing GNR, she didn't start appropriate therapy until 1/20. Recommend 14 day of meropenem using 1/21 as day 1. Will place picc line order and coordinate home health.. defer to primary team when she can discharge. Will set up follow up appt in ID clinic   [facility] for Infectious Diseases Cell: [phone] Pager: [phone]  07/31/2019, 8:26 AM

## 2019-07-31 NOTE — Progress Notes (Addendum)
PHARMACY CONSULT NOTE FOR:  OUTPATIENT  PARENTERAL ANTIBIOTIC THERAPY (OPAT)  Indication: ESBL E. Coli bacteremia Regimen: Ertapenem 1gm IV q24h End date: 08/12/2019  IV antibiotic discharge orders are pended. To discharging provider:  please sign these orders via discharge navigator,  Select New Orders & click on the button choice - Manage This Unsigned Work.     Thank you for allowing pharmacy to be a part of this patient's care.  Doreene Eland, PharmD, BCPS.   Work Cell: [phone] 07/31/2019 3:00 PM

## 2019-07-31 NOTE — Progress Notes (Signed)
Occupational Therapy Treatment Patient Details Name: Alexis Murphy MRN: [id number] DOB: 20-May-1946 Today's Date: 07/31/2019    History of present illness 74 yo female admitted with Pna, weakness. hx of sarcoidosis, COVID 19, OA   OT comments  Pt with good participation.  Pt feels she is getting stronger.  Pt will have needed A as needed  Follow Up Recommendations  Home health OT    Equipment Recommendations  3 in 1 bedside commode    Recommendations for Other Services      Precautions / Restrictions Precautions Precautions: Fall Precaution Comments: monitor HR Restrictions Weight Bearing Restrictions: No       Mobility Bed Mobility               General bed mobility comments: pt OOB  Transfers Overall transfer level: Needs assistance Equipment used: Rolling walker (2 wheeled) Transfers: Sit to/from [facility] Sit to Stand: Min assist Stand pivot transfers: Min assist            Balance Overall balance assessment: Needs assistance Sitting-balance support: No upper extremity supported;Feet unsupported Sitting balance-Leahy Scale: Good     Standing balance support: No upper extremity supported Standing balance-Leahy Scale: Fair                             ADL either performed or assessed with clinical judgement   ADL Overall ADL's : Needs assistance/impaired     Grooming: Wash/dry hands;Wash/dry face;Min guard;Standing;Oral care                   Toilet Transfer: Minimal assistance;Stand-pivot;Cueing for safety;BSC;RW   Toileting- Water quality scientist and Hygiene: Min guard;Sit to/from stand       Functional mobility during ADLs: Minimal assistance;Cueing for safety       Vision Baseline Vision/History: No visual deficits     Perception     Praxis      Cognition Arousal/Alertness: Awake/alert Behavior During Therapy: WFL for tasks assessed/performed Overall Cognitive Status: Within Functional  Limits for tasks assessed                                                     Pertinent Vitals/ Pain       Pain Assessment: No/denies pain     Prior Functioning/Environment              Frequency  Min 2X/week        Progress Toward Goals  OT Goals(current goals can now be found in the care plan section)  Progress towards OT goals: Progressing toward goals     Plan Discharge plan remains appropriate       AM-PAC OT "6 Clicks" Daily Activity     Outcome Measure   Help from another person eating meals?: None Help from another person taking care of personal grooming?: A Little Help from another person toileting, which includes using toliet, bedpan, or urinal?: A Little Help from another person bathing (including washing, rinsing, drying)?: A Little Help from another person to put on and taking off regular upper body clothing?: None Help from another person to put on and taking off regular lower body clothing?: A Little 6 Click Score: 20    End of Session Equipment Utilized During Treatment: Gait belt;Rolling walker  OT Visit Diagnosis:  Unsteadiness on feet (R26.81);Muscle weakness (generalized) (M62.81)   Activity Tolerance Patient tolerated treatment well   Patient Left with call bell/phone within reach;in chair;with chair alarm set   Nurse Communication          Time: 9326-7124 OT Time Calculation (min): 22 min  Charges: OT General Charges $OT Visit: 1 Visit OT Treatments $Self Care/Home Management : 8-22 mins  Kari Baars, [place] [phone] Office- [phone]      [place], Edwena Felty D 07/31/2019, 2:30 PM

## 2019-07-31 NOTE — Progress Notes (Signed)
ID PROGRESS NOTE  Plan to treat with daily ertapenem instead of meropenem due to ease/cost for patient. Still using same parameters of 1/21 as day 1 of abtx for 14day course- through feb 3rd.  Alexis Murphy [place] for Infectious Diseases [phone]

## 2019-07-31 NOTE — TOC Progression Note (Signed)
Transition of Care [facility]) - Progression Note    Patient Details  Name: Alexis Murphy MRN: [id number] Date of Birth: 11-Oct-1945  [facility]) CM/SW Contact  Cambrea Kirt, Juliann Pulse, RN Phone Number: 07/31/2019, 3:42 PM  Clinical Narrative:[facility] rep Pam for initial teaching iv abx,& med supplies,[facility] for HHRN-iv abx teaching, [facility] for [place].       Expected Discharge Plan: [place] Barriers to Discharge: Continued Medical Work up  Expected Discharge Plan and Services Expected Discharge Plan: [place]   Discharge Planning Services: CM Consult Post Acute Care Choice: [place] arrangements for the past 2 months: Single Family Home                           HH Arranged: IV Antibiotics HH Agency: [facility] Date HH Agency Contacted: 07/29/19 Time HH Agency Contacted: 1137   Social Determinants of Health (SDOH) Interventions    Readmission Risk Interventions No flowsheet data found.

## 2019-07-31 NOTE — TOC Transition Note (Signed)
Transition of Care [facility]) - CM/SW Discharge Note   Patient Details  Name: Alexis Murphy MRN: [id number] Date of Birth: 1946/05/24  Transition [facility] Care [facility]) CM/SW Contact:  Dessa Phi, RN Phone Number: 07/31/2019, 1:02 PM   Clinical Narrative: [facility] for HHPT only [place] on 1/26-patient/son agree.They can still d/c over weekend. HHRN-[place] for iv abx through [employer] rep Pam iv infusion-initial teaching & med. Son will transport home.      Final next level of care: [place] Barriers to Discharge: Continued Medical Work up   Patient Goals and CMS Choice Patient states their goals for this hospitalization and ongoing recovery are:: go home CMS Medicare.gov Compare Post Acute Care list provided to:: Patient Choice offered to / list presented to : Patient  Discharge Placement                       Discharge Plan and Services   Discharge Planning Services: CM Consult Post Acute Care Choice: Home Health                    HH Arranged: IV Antibiotics HH Agency: [facility] Date HH Agency Contacted: 07/29/19 Time [place]: [id number] Representative spoke with at [place]: [place]  Social Determinants of Health ([place]) Interventions     Readmission Risk Interventions No flowsheet data found.

## 2019-07-31 NOTE — Progress Notes (Signed)
Pharmacy Antibiotic Note  Alexis Murphy is a 74 y.o. female admitted on 07/26/2019 with bacteremia. Pharmacy has been consulted for meropenem dosing.  1/17 blood culture = ESBL E. Coli.  No urine culture was ordered prior to start of antibiotics.   1/20 blood culture also with GNR.   PCN allergy listed - pharmacist discussed with patient 1/19 and she states she took keflex in past for abdominal abscess. Patient has been tolerating Meropenem thus far.    Plan:  Continue Meropenem 1g IV q12h   Monitor renal function, cultures, clinical course  ID recommending 14 day course of Meropenem using 1/21 as day 1  Height: 5\' 1"  (154.9 cm) Weight: 169 lb 9.6 oz (76.9 kg) IBW/kg (Calculated) : 47.8  Temp (24hrs), Avg:98.1 F (36.7 C), Min:97.8 F (36.6 C), Max:98.3 F (36.8 C)  Recent Labs  Lab 07/26/19 1719 07/26/19 1758 07/26/19 2027 07/27/19 0547 07/28/19 0433 07/29/19 0341 07/30/19 0454 07/31/19 0511  WBC  --    < >  --  11.3* 7.3 3.4* 6.5 9.6  CREATININE  --    < >  --  2.04* 1.67* 1.52* 1.66* 1.55*  LATICACIDVEN 2.0*  --  1.9  --   --   --   --   --    < > = values in this interval not displayed.    Estimated Creatinine Clearance: 30.8 mL/min (A) (by C-G formula based on SCr of 1.55 mg/dL (H)).    Allergies  Allergen Reactions  . Penicillins Anaphylaxis and Rash    Did it involve swelling of the face/tongue/throat, SOB, or low BP? Yes  Did it involve sudden or severe rash/hives, skin peeling, or any reaction on the inside of your mouth or nose? No Did you need to seek medical attention at a hospital or doctor's office? No When did it last happen? Childhood If all above answers are "NO", may proceed with cephalosporin use.   . Codeine Other (See Comments)    Vomiting     Antimicrobials this admission:  1/17 Levofloxacin >> 1/18 1/18 Aztreonam >> 1/20 1/20 Meropenem >>  Dose adjustments this admission:  --  Microbiology results:  1/17 BCx: 4/4 ESBL  E.coli 1/17 COVID: negative 1/20 UCx: insignificant growth 1/20 BCx: 1/4 gram negative rods   Thank you for allowing pharmacy to be a part of this patient's care.   Lindell Spar, PharmD, BCPS Clinical Pharmacist  07/31/2019 9:06 AM

## 2019-07-31 NOTE — Progress Notes (Signed)
Progress Note  Patient Name: Alexis Murphy Date of Encounter: 07/31/2019  Primary Cardiologist: Pixie Casino, MD   Subjective   No complaints  Inpatient Medications    Scheduled Meds: . aspirin EC  81 mg Oral Daily  . atorvastatin  40 mg Oral QPM  . folic acid  5 mg Oral Daily  . Melatonin  5 mg Oral QHS  . metoprolol tartrate  25 mg Oral BID  . pantoprazole  40 mg Oral Daily  . predniSONE  5 mg Oral Daily  . venlafaxine XR  150 mg Oral QPM   Continuous Infusions: . meropenem (MERREM) IV Stopped (07/30/19 2105)   PRN Meds: hydrALAZINE   Vital Signs    Vitals:   07/30/19 1117 07/30/19 1304 07/30/19 2029 07/31/19 0445  BP: 105/63 106/74 135/71 138/78  Pulse: 93 (!) 105 (!) 101 (!) 102  Resp:  20 20 20   Temp:  98.2 F (36.8 C) 98.3 F (36.8 C) 97.8 F (36.6 C)  TempSrc:  Oral Oral Oral  SpO2:  98% 100% 99%  Weight:      Height:        Intake/Output Summary (Last 24 hours) at 07/31/2019 0826 Last data filed at 07/31/2019 0502 Gross per 24 hour  Intake 700 ml  Output 1150 ml  Net -450 ml   Last 3 Weights 07/27/2019 07/26/2019 06/29/2019  Weight (lbs) 169 lb 9.6 oz 152 lb 164 lb 4.8 oz  Weight (kg) 76.93 kg 68.947 kg 74.526 kg      Telemetry    SR - Personally Reviewed  ECG    SR normal EKG - Personally Reviewed  Physical Exam   GEN: No acute distress.   Neck: No JVD Cardiac: RRR, no murmurs, rubs, or gallops.  Respiratory: Clear to auscultation bilaterally. GI: Soft, nontender, non-distended  MS: No edema; No deformity. Neuro:  Nonfocal  Psych: Normal affect   Labs    High Sensitivity Troponin:   Recent Labs  Lab 07/26/19 1719  TROPONINIHS 28*      Chemistry Recent Labs  Lab 07/27/19 0547 07/28/19 0433 07/29/19 0341 07/30/19 0454 07/31/19 0511  NA 137   < > 139 138 140  K 2.8*   < > 3.4* 3.4* 5.2*  CL 107   < > 111 110 116*  CO2 19*   < > 17* 17* 16*  GLUCOSE 129*   < > 93 92 76  BUN 33*   < > 30* 30* 31*  CREATININE  2.04*   < > 1.52* 1.66* 1.55*  CALCIUM 8.0*   < > 8.0* 7.7* 8.0*  PROT 5.7*  --  5.7*  --  5.7*  ALBUMIN 2.8*  --  2.7*  --  2.5*  AST 16  --  17  --  18  ALT 17  --  18  --  20  ALKPHOS 99  --  75  --  93  BILITOT 1.1  --  1.3*  --  0.7  GFRNONAA 24*   < > 34* 30* 33*  GFRAA 28*   < > 39* 35* 38*  ANIONGAP 11   < > 11 11 8    < > = values in this interval not displayed.     Hematology Recent Labs  Lab 07/29/19 0341 07/29/19 0341 07/30/19 0454 07/30/19 1352 07/31/19 0511  WBC 3.4*  --  6.5  --  9.6  RBC 2.58*  --  2.30*  --  2.55*  HGB 9.0*  --  8.3*  --  9.2*  HCT 28.5*  --  26.7*  --  29.6*  MCV 110.5*  --  116.1*  --  116.1*  MCH 34.9*  --  36.1*  --  36.1*  MCHC 31.6  --  31.1  --  31.1  RDW 15.4  --  15.8*  --  15.9*  PLT 73*   < > 69* 68* 73*   < > = values in this interval not displayed.    BNPNo results for input(s): BNP, PROBNP in the last 168 hours.   DDimer  Recent Labs  Lab 07/26/19 1719 07/30/19 1352  DDIMER 8.64* 7.02*     Radiology    ECHOCARDIOGRAM COMPLETE  Result Date: 07/30/2019   ECHOCARDIOGRAM REPORT   Patient Name:   Alexis Murphy Date of Exam: 07/30/2019 Medical Rec #:  [id number]       Height:       61.0 in Accession #:    [id number]      Weight:       169.6 lb Date of Birth:  Jul 03, 1946       BSA:          1.76 m Patient Age:    74 years        BP:           119/92 mmHg Patient Gender: F               HR:           132 bpm. Exam Location:  Inpatient Procedure: 2D Echo, Cardiac Doppler and Color Doppler Indications:    Abnormal EKG  History:        Patient has no prior history of Echocardiogram examinations.                 Arrythmias:Atrial Fibrillation; Risk Factors:Hypertension and                 Dyslipidemia. Sepsis.  Sonographer:    Dustin Flock Referring Phys: [id number] [facility]  Sonographer Comments: Suboptimal parasternal window. Image acquisition challenging due to patient body habitus. IMPRESSIONS  1. Left ventricular ejection  fraction, by visual estimation, is 55 to 60%. The left ventricle has normal function. Moderately increased left ventricular posterior wall thickness. There is no left ventricular hypertrophy.  2. Left ventricular diastolic parameters are indeterminate.  3. The left ventricle has no regional wall motion abnormalities.  4. Global right ventricle has normal systolic function.The right ventricular size is normal. No increase in right ventricular wall thickness.  5. Left atrial size was mildly dilated.  6. Right atrial size was normal.  7. The mitral valve is normal in structure. Mild mitral valve regurgitation.  8. The tricuspid valve is normal in structure. Tricuspid valve regurgitation is not demonstrated.  9. The aortic valve is tricuspid. Aortic valve regurgitation is mild. No evidence of aortic valve sclerosis or stenosis 10. TR signal is inadequate for assessing pulmonary artery systolic pressure. 11. The inferior vena cava is normal in size with <50% respiratory variability, suggesting right atrial pressure of 8 mmHg. 12. The patient was in atrial fibrillation. 13. Technically difficult study with poor acoustic windows. FINDINGS  Left Ventricle: Left ventricular ejection fraction, by visual estimation, is 55 to 60%. The left ventricle has normal function. The left ventricle has no regional wall motion abnormalities. The left ventricular internal cavity size was the left ventricle is normal in size. Moderately increased left ventricular posterior wall thickness. There is no left  ventricular hypertrophy. Left ventricular diastolic parameters are indeterminate. Right Ventricle: The right ventricular size is normal. No increase in right ventricular wall thickness. Global RV systolic function is has normal systolic function. Left Atrium: Left atrial size was mildly dilated. Right Atrium: Right atrial size was normal in size Pericardium: There is no evidence of pericardial effusion. Mitral Valve: The mitral valve is  normal in structure. Mild mitral valve regurgitation. Tricuspid Valve: The tricuspid valve is normal in structure. Tricuspid valve regurgitation is not demonstrated. Aortic Valve: The aortic valve is tricuspid. Aortic valve regurgitation is mild. Aortic regurgitation PHT measures 664 msec. The aortic valve is structurally normal, with no evidence of sclerosis or stenosis. Pulmonic Valve: The pulmonic valve was normal in structure. Pulmonic valve regurgitation is not visualized. Pulmonic regurgitation is not visualized. Aorta: The aortic root is normal in size and structure. Venous: The inferior vena cava is normal in size with less than 50% respiratory variability, suggesting right atrial pressure of 8 mmHg. IAS/Shunts: No atrial level shunt detected by color flow Doppler.  LEFT VENTRICLE PLAX 2D LVIDd:         4.40 cm  Diastology LVIDs:         3.10 cm  LV e' lateral:   4.68 cm/s LV PW:         1.10 cm  LV E/e' lateral: 29.9 LV IVS:        0.90 cm  LV e' medial:    6.85 cm/s LVOT diam:     1.50 cm  LV E/e' medial:  20.4 LV SV:         50 ml LV SV Index:   26.89 LVOT Area:     1.77 cm  RIGHT VENTRICLE RV Basal diam:  2.10 cm RV S prime:     5.22 cm/s TAPSE (M-mode): 2.4 cm LEFT ATRIUM             Index       RIGHT ATRIUM           Index LA diam:        3.80 cm 2.16 cm/m  RA Area:     14.70 cm LA Vol (A2C):   46.6 ml 26.46 ml/m RA Volume:   35.40 ml  20.10 ml/m LA Vol (A4C):   52.0 ml 29.53 ml/m LA Biplane Vol: 50.5 ml 28.68 ml/m  AORTIC VALVE LVOT Vmax:   99.80 cm/s LVOT Vmean:  56.500 cm/s LVOT VTI:    0.158 m AI PHT:      664 msec  AORTA Ao Root diam: 2.50 cm MITRAL VALVE MV Area (PHT): 7.59 cm             SHUNTS MV PHT:        29.00 msec           Systemic VTI:  0.16 m MV Decel Time: 100 msec             Systemic Diam: 1.50 cm MV E velocity: 140.00 cm/s 103 cm/s  Loralie Champagne MD Electronically signed by Loralie Champagne MD Signature Date/Time: 07/30/2019/4:34:41 PM    Final     Cardiac Studies   07/30/19  Echo IMPRESSIONS    1. Left ventricular ejection fraction, by visual estimation, is 55 to 60%. The left ventricle has normal function. Moderately increased left ventricular posterior wall thickness. There is no left ventricular hypertrophy.  2. Left ventricular diastolic parameters are indeterminate.  3. The left ventricle has no regional wall motion abnormalities.  4. Global right ventricle has normal systolic function.The right ventricular size is normal. No increase in right ventricular wall thickness.  5. Left atrial size was mildly dilated.  6. Right atrial size was normal.  7. The mitral valve is normal in structure. Mild mitral valve regurgitation.  8. The tricuspid valve is normal in structure. Tricuspid valve regurgitation is not demonstrated.  9. The aortic valve is tricuspid. Aortic valve regurgitation is mild. No evidence of aortic valve sclerosis or stenosis 10. TR signal is inadequate for assessing pulmonary artery systolic pressure. 11. The inferior vena cava is normal in size with <50% respiratory variability, suggesting right atrial pressure of 8 mmHg. 12. The patient was in atrial fibrillation. 13. Technically difficult study with poor acoustic windows.  FINDINGS  Left Ventricle: Left ventricular ejection fraction, by visual estimation, is 55 to 60%. The left ventricle has normal function. The left ventricle has no regional wall motion abnormalities. The left ventricular internal cavity size was the left  ventricle is normal in size. Moderately increased left ventricular posterior wall thickness. There is no left ventricular hypertrophy. Left ventricular diastolic parameters are indeterminate.  Right Ventricle: The right ventricular size is normal. No increase in right ventricular wall thickness. Global RV systolic function is has normal systolic function.  Left Atrium: Left atrial size was mildly dilated.  Right Atrium: Right atrial size was normal in size   Pericardium: There is no evidence of pericardial effusion.  Mitral Valve: The mitral valve is normal in structure. Mild mitral valve regurgitation.  Tricuspid Valve: The tricuspid valve is normal in structure. Tricuspid valve regurgitation is not demonstrated.  Aortic Valve: The aortic valve is tricuspid. Aortic valve regurgitation is mild. Aortic regurgitation PHT measures 664 msec. The aortic valve is structurally normal, with no evidence of sclerosis or stenosis.  Pulmonic Valve: The pulmonic valve was normal in structure. Pulmonic valve regurgitation is not visualized. Pulmonic regurgitation is not visualized.  Aorta: The aortic root is normal in size and structure.  Venous: The inferior vena cava is normal in size with less than 50% respiratory variability, suggesting right atrial pressure of 8 mmHg.  IAS/Shunts: No atrial level shunt detected by color flow Doppler.      Patient Profile     74 y.o. female with a hx of sarcoidosis on methotrexate and hydroxyurea, HTN, HLD, s/p COVID 03/2019 and admitted 07/26/19 with abd pain, vomiting, weakness, diarrhea + bacteremia and then went into a fib with RVR.    Assessment & Plan    1. Atrial fib with RVR in combination with PNA, bacteremia and sepsis.  was on IV dilt at 15 and IV amiodarone 30 cc hr, lopressor 25 BID po.  Not on anticoagulation due to thrombocytopenia.  No bleeding.  cha2DS2VASc of 3 with HTN age and female  She converted to [place] yesterday afternoon and is off IV amiodarone and dilt IV.  Continue lopressor.  Normal EF    2.  HTN stable will tolerate BB 3. AKI improving  Cr 2.54>>2.04>>1.67>>1.52>> 1.66>>1.55 4. Hypokalemia on admit and yesterday  - replaced and today  K+ 5.2 hold K+ 5. Thrombocytopenia Heparin stopped, plts at 69k >>73, leukopenia resolved.  6. Sarcoidosis on steroids and methotrexate.      For questions or updates, please contact [place] Please consult [email or web address] for contact info  under        Signed, Cecilie Kicks, NP  07/31/2019, 8:26 AM

## 2019-07-31 NOTE — Progress Notes (Signed)
PROGRESS NOTE    Alexis Murphy  ZTI:[id number] DOB: 12-Jan-1946 DOA: 07/26/2019 PCP: Lawerance Cruel, MD    Brief Narrative:  74 year old female with history of hypertension, hyperlipidemia, sarcoidosis on prednisone, hydroxyurea and methotrexate and arthritis presented to the emergency room with generalized weakness, shortness of breath nausea and vomiting.  Patient was diagnosed with COVID-19 infection with similar symptoms and September 2020.  In the emergency room, her blood pressures were stable.  Heart rate 117.  WBC 16.9.  Hypokalemia.  Creatinine 2.5.  Lactic acid 2.  Procalcitonin 134.  Chest x-ray showed reticulonodular opacity in the right lung.  Treated with IV fluids, Decadron and admitted with IV antibiotics.   Assessment & Plan:   Principal Problem:   Bacteremia due to Gram-negative bacteria Active Problems:   Pneumonia   Hypokalemia   AKI (acute kidney injury) ([place])   Sepsis ([place])   New onset a-fib ([facility])  Sepsis present on admission secondary to ESBL E. coli bacteremia: Primary source unknown.  Probably urinary source.  Previously on aztreonam, changed to meropenem.  Urine culture not available from admission.  Repeat cultures no growth so far.  Blood cultures, 1/17 ESBL E. Coli Blood cultures, 1/20 gram-negative rods, started on meropenem same day Urine cultures negative. . CT scan of the abdomen pelvis with no evidence of abscesses or secondary cause for infection, likely perinephric stranding. Chest x-ray shows reticulo nodular opacity that is consistent with history of sarcoidosis.  No evidence of pneumonia. PICC line today, IV antibiotics for 14 days, day 2/14 today.  New onset A. fib with RVR: Probably related to sepsis.  Difficult to control heart rate. Initially difficult to control rate.  Now converted to sinus rhythm.  Treated with Cardizem, amiodarone and beta-blockers.   Currently on beta-blockers with good rate control.  TSH normal.  Seen by  cardiology.  No anticoagulation at this time.  Acute kidney injury with history of chronic kidney disease stage IIIa: Known baseline creatinine about 1.26.  Treated with IV fluids with gradual clinical improvement.  We will continue close monitoring.  Hypertension: Holding amlodipine. beta-blockers and Cardizem.  Hypokalemia: Replaced aggressively.  Overcorrected.  Discontinue all supplements. Recheck in the morning.  Sarcoidosis: Followed by oncology outpatient.  Currently on maintenance steroids and methotrexate that is on hold.  Increase folic acid to 5 mg daily with the use of methotrexate.  Thrombocytopenia/leukocytopenia: Probably related to acute bacterial sepsis.  Heparin antibody pending.  Not consistent with DIC.  Already improving or normalizing. Close monitoring.  Currently no indication for blood or platelet transfusions.  Discussed with oncology.  DVT prophylaxis: SCDs. Code Status: Full code Family Communication: Patient's son at the bedside 1/21. Called to update, unable to reach. Disposition Plan: Anticipate home next 24 to 48 hours with PICC line and home infusion therapy.   Consultants:   Cardiology  Infectious disease  Procedures:   None  Antimicrobials:  Anti-infectives (From admission, onward)   Start     Dose/Rate Route Frequency Ordered Stop   07/30/19 0000  meropenem (MERREM) 1 g in sodium chloride 0.9 % 100 mL IVPB     1 g 200 mL/hr over 30 Minutes Intravenous Every 12 hours 07/29/19 1414     07/29/19 1400  meropenem (MERREM) 1 g in sodium chloride 0.9 % 100 mL IVPB     1 g 200 mL/hr over 30 Minutes Intravenous STAT 07/29/19 1347 07/29/19 2027   07/28/19 2200  levofloxacin (LEVAQUIN) IVPB 750 mg  Status:  Discontinued  750 mg 100 mL/hr over 90 Minutes Intravenous Every 48 hours 07/27/19 0356 07/27/19 1531   07/27/19 1600  aztreonam (AZACTAM) 1 g in sodium chloride 0.9 % 100 mL IVPB  Status:  Discontinued     1 g 200 mL/hr over 30 Minutes  Intravenous Every 8 hours 07/27/19 1532 07/29/19 1345   07/26/19 1930  levofloxacin (LEVAQUIN) IVPB 750 mg     750 mg 100 mL/hr over 90 Minutes Intravenous  Once 07/26/19 1928 07/26/19 2311         Subjective: Patient seen and examined.  No overnight events.  Remains sinus rhythm.  Patient herself denies any complaints.  Afebrile.  Objective: Vitals:   07/30/19 1117 07/30/19 1304 07/30/19 2029 07/31/19 0445  BP: 105/63 106/74 135/71 138/78  Pulse: 93 (!) 105 (!) 101 (!) 102  Resp:  20 20 20   Temp:  98.2 F (36.8 C) 98.3 F (36.8 C) 97.8 F (36.6 C)  TempSrc:  Oral Oral Oral  SpO2:  98% 100% 99%  Weight:      Height:        Intake/Output Summary (Last 24 hours) at 07/31/2019 1205 Last data filed at 07/31/2019 1000 Gross per 24 hour  Intake 1202.88 ml  Output 1150 ml  Net 52.88 ml   Filed Weights   07/26/19 2324 07/27/19 2227  Weight: 68.9 kg 76.9 kg    Examination:  General exam: Appears calm and comfortable, on room air. Respiratory system: Clear to auscultation. Respiratory effort normal.  No added sounds. Cardiovascular system: S1 & S2 heard, RRR .  No JVD, murmurs, rubs, gallops or clicks. No pedal edema. Gastrointestinal system: Abdomen is nondistended, soft and nontender. No organomegaly or masses felt. Normal bowel sounds heard. Central nervous system: Alert and oriented. No focal neurological deficits. Extremities: Symmetric 5 x 5 power. Skin: No rashes, lesions or ulcers Psychiatry: Judgement and insight appear normal. Mood & affect appropriate.     Data Reviewed: I have personally reviewed following labs and imaging studies  CBC: Recent Labs  Lab 07/27/19 0547 07/27/19 0547 07/28/19 0433 07/29/19 0341 07/30/19 0454 07/30/19 1352 07/31/19 0511  WBC 11.3*  --  7.3 3.4* 6.5  --  9.6  NEUTROABS  --   --  6.4  --  5.8  --  8.7*  HGB 9.3*  --  9.2* 9.0* 8.3*  --  9.2*  HCT 28.0*  --  28.2* 28.5* 26.7*  --  29.6*  MCV 107.7*  --  110.6* 110.5*  116.1*  --  116.1*  PLT 90*   < > 84* 73* 69* 68* 73*   < > = values in this interval not displayed.   Basic Metabolic Panel: Recent Labs  Lab 07/27/19 0547 07/28/19 0433 07/29/19 0341 07/29/19 0804 07/30/19 0454 07/31/19 0511  NA 137 141 139  --  138 140  K 2.8* 3.7 3.4*  --  3.4* 5.2*  CL 107 115* 111  --  110 116*  CO2 19* 17* 17*  --  17* 16*  GLUCOSE 129* 111* 93  --  92 76  BUN 33* 34* 30*  --  30* 31*  CREATININE 2.04* 1.67* 1.52*  --  1.66* 1.55*  CALCIUM 8.0* 8.2* 8.0*  --  7.7* 8.0*  MG  --   --   --  1.8 2.4 2.4  PHOS  --   --   --  3.3 2.8 2.9   GFR: Estimated Creatinine Clearance: 30.8 mL/min (A) (by C-G formula based on  SCr of 1.55 mg/dL (H)). Liver Function Tests: Recent Labs  Lab 07/26/19 1632 07/27/19 0547 07/29/19 0341 07/31/19 0511  AST 21 16 17 18   ALT 17 17 18 20   ALKPHOS 114 99 75 93  BILITOT 1.3* 1.1 1.3* 0.7  PROT 6.3* 5.7* 5.7* 5.7*  ALBUMIN 3.5 2.8* 2.7* 2.5*   Recent Labs  Lab 07/26/19 1632  LIPASE 15   No results for input(s): AMMONIA in the last 168 hours. Coagulation Profile: Recent Labs  Lab 07/30/19 1352  INR 1.2   Cardiac Enzymes: No results for input(s): CKTOTAL, CKMB, CKMBINDEX, TROPONINI in the last 168 hours. BNP (last 3 results) No results for input(s): PROBNP in the last 8760 hours. HbA1C: No results for input(s): HGBA1C in the last 72 hours. CBG: No results for input(s): GLUCAP in the last 168 hours. Lipid Profile: No results for input(s): CHOL, HDL, LDLCALC, TRIG, CHOLHDL, LDLDIRECT in the last 72 hours. Thyroid Function Tests: Recent Labs    07/29/19 1509  TSH 1.914   Anemia Panel: No results for input(s): VITAMINB12, FOLATE, FERRITIN, TIBC, IRON, RETICCTPCT in the last 72 hours. Sepsis Labs: Recent Labs  Lab 07/26/19 1719 07/26/19 2027  PROCALCITON 134.68  --   LATICACIDVEN 2.0* 1.9    Recent Results (from the past 240 hour(s))  SARS CORONAVIRUS 2 (TAT 6-24 HRS) Nasopharyngeal Nasopharyngeal  Swab     Status: None   Collection Time: 07/26/19  5:03 PM   Specimen: Nasopharyngeal Swab  Result Value Ref Range Status   SARS Coronavirus 2 NEGATIVE NEGATIVE Final    Comment: (NOTE) SARS-CoV-2 target nucleic acids are NOT DETECTED. The SARS-CoV-2 RNA is generally detectable in upper and lower respiratory specimens during the acute phase of infection. Negative results do not preclude SARS-CoV-2 infection, do not rule out co-infections with other pathogens, and should not be used as the sole basis for treatment or other patient management decisions. Negative results must be combined with clinical observations, patient history, and epidemiological information. The expected result is Negative. Fact Sheet for Patients: SugarRoll.be Fact Sheet for Healthcare Providers: https://www.woods-mathews.com/ This test is not yet approved or cleared by the [place] FDA and  has been authorized for detection and/or diagnosis of SARS-CoV-2 by FDA under an Emergency Use Authorization (EUA). This EUA will remain  in effect (meaning this test can be used) for the duration of the COVID-19 declaration under Section 56 4(b)(1) of the Act, 21 U.S.C. section 360bbb-3(b)(1), unless the authorization is terminated or revoked sooner. Performed at [facility], [place] [address], [place], [place] [address]   Blood Culture (routine x 2)     Status: Abnormal   Collection Time: 07/26/19  5:19 PM   Specimen: BLOOD LEFT HAND  Result Value Ref Range Status   Specimen Description   Final    BLOOD LEFT HAND Performed at [facility] [address] [place], [place] [address]    Special Requests   Final    BOTTLES DRAWN AEROBIC AND ANAEROBIC Blood Culture adequate volume Performed at [facility] [address] [place], [place] [address]    Culture  Setup Time   Final    GRAM NEGATIVE RODS IN BOTH AEROBIC AND ANAEROBIC  BOTTLES CRITICAL RESULT CALLED TO, READ BACK BY AND VERIFIED WITH: Shelda Jakes PharmD 15:25 07/27/19 ([email or web address]) Performed at [facility], [place] [address] [place], [place] [address]    Culture (A)  Final    ESCHERICHIA COLI Confirmed Extended Spectrum Beta-Lactamase  Producer (ESBL).  In bloodstream infections from ESBL organisms, carbapenems are preferred over piperacillin/tazobactam. They are shown to have a lower risk of mortality.    Report Status 07/29/2019 FINAL  Final   Organism ID, Bacteria ESCHERICHIA COLI  Final      Susceptibility   Escherichia coli - MIC*    AMPICILLIN >=32 RESISTANT Resistant     CEFAZOLIN >=64 RESISTANT Resistant     CEFEPIME 16 RESISTANT Resistant     CEFTAZIDIME RESISTANT Resistant     CEFTRIAXONE >=64 RESISTANT Resistant     CIPROFLOXACIN >=4 RESISTANT Resistant     GENTAMICIN >=16 RESISTANT Resistant     IMIPENEM <=0.25 SENSITIVE Sensitive     TRIMETH/SULFA >=320 RESISTANT Resistant     AMPICILLIN/SULBACTAM >=32 RESISTANT Resistant     PIP/TAZO <=4 SENSITIVE Sensitive     * ESCHERICHIA COLI  Blood Culture ID Panel (Reflexed)     Status: Abnormal   Collection Time: 07/26/19  5:19 PM  Result Value Ref Range Status   Enterococcus species NOT DETECTED NOT DETECTED Final   Listeria monocytogenes NOT DETECTED NOT DETECTED Final   Staphylococcus species NOT DETECTED NOT DETECTED Final   Staphylococcus aureus (BCID) NOT DETECTED NOT DETECTED Final   Streptococcus species NOT DETECTED NOT DETECTED Final   Streptococcus agalactiae NOT DETECTED NOT DETECTED Final   Streptococcus pneumoniae NOT DETECTED NOT DETECTED Final   Streptococcus pyogenes NOT DETECTED NOT DETECTED Final   Acinetobacter baumannii NOT DETECTED NOT DETECTED Final   Enterobacteriaceae species DETECTED (A) NOT DETECTED Final    Comment: Enterobacteriaceae represent a large family of gram-negative bacteria, not a single organism. CRITICAL RESULT CALLED TO, READ BACK BY AND VERIFIED  WITH: Shelda Jakes PharmD 15:25 07/27/19 ([email or web address])    Enterobacter cloacae complex NOT DETECTED NOT DETECTED Final   Escherichia coli DETECTED (A) NOT DETECTED Final    Comment: CRITICAL RESULT CALLED TO, READ BACK BY AND VERIFIED WITH: Shelda Jakes PharmD 15:25 07/27/19 ([email or web address])    Klebsiella oxytoca NOT DETECTED NOT DETECTED Final   Klebsiella pneumoniae NOT DETECTED NOT DETECTED Final   Proteus species NOT DETECTED NOT DETECTED Final   Serratia marcescens NOT DETECTED NOT DETECTED Final   Carbapenem resistance NOT DETECTED NOT DETECTED Final   Haemophilus influenzae NOT DETECTED NOT DETECTED Final   Neisseria meningitidis NOT DETECTED NOT DETECTED Final   Pseudomonas aeruginosa NOT DETECTED NOT DETECTED Final   Candida albicans NOT DETECTED NOT DETECTED Final   Candida glabrata NOT DETECTED NOT DETECTED Final   Candida krusei NOT DETECTED NOT DETECTED Final   Candida parapsilosis NOT DETECTED NOT DETECTED Final   Candida tropicalis NOT DETECTED NOT DETECTED Final    Comment: Performed at [facility], [place] [address] [place], [place] [address]  Blood Culture (routine x 2)     Status: Abnormal   Collection Time: 07/26/19  5:24 PM   Specimen: BLOOD RIGHT FOREARM  Result Value Ref Range Status   Specimen Description   Final    BLOOD RIGHT FOREARM Performed at [facility] [address] [place], [place] [address]    Special Requests   Final    BOTTLES DRAWN AEROBIC AND ANAEROBIC Blood Culture adequate volume Performed at [facility] [address] [place], [place] [address]    Culture  Setup Time   Final    GRAM NEGATIVE RODS IN BOTH AEROBIC AND ANAEROBIC BOTTLES CRITICAL VALUE NOTED.  VALUE IS CONSISTENT WITH PREVIOUSLY REPORTED AND CALLED VALUE.  Culture (A)  Final    ESCHERICHIA COLI SUSCEPTIBILITIES PERFORMED ON PREVIOUS CULTURE WITHIN THE LAST 5 DAYS. Performed at [facility], [place] [address] [place], [place]  [address]    Report Status 07/29/2019 FINAL  Final  Culture, blood (routine x 2)     Status: None (Preliminary result)   Collection Time: 07/29/19  8:04 AM   Specimen: BLOOD  Result Value Ref Range Status   Specimen Description   Final    BLOOD RIGHT THUMB Performed at [facility] [address], [place], [place] [address]    Special Requests   Final    BOTTLES DRAWN AEROBIC ONLY Blood Culture adequate volume Performed at [facility] [address], [place], [place] [address]    Culture  Setup Time   Final    AEROBIC BOTTLE ONLY GRAM NEGATIVE RODS CRITICAL VALUE NOTED.  VALUE IS CONSISTENT WITH PREVIOUSLY REPORTED AND CALLED VALUE. Performed at [facility], [place] [address], [place], [place] [address]    Culture GRAM NEGATIVE RODS  Final   Report Status PENDING  Incomplete  Culture, blood (routine x 2)     Status: None (Preliminary result)   Collection Time: 07/29/19  8:04 AM   Specimen: BLOOD RIGHT HAND  Result Value Ref Range Status   Specimen Description   Final    BLOOD RIGHT HAND Performed at [facility] [address] [place], [place] [address]    Special Requests   Final    BOTTLES DRAWN AEROBIC ONLY Blood Culture adequate volume Performed at [facility] [address], [place], [place] [address]    Culture   Final    NO GROWTH 2 DAYS Performed at [facility] [address], [place], [place] [address]    Report Status PENDING  Incomplete  Culture, Urine     Status: Abnormal   Collection Time: 07/29/19 10:44 AM   Specimen: Urine, Clean Catch  Result Value Ref Range Status   Specimen Description   Final    URINE, CLEAN CATCH Performed at [facility], [place] [address], [place], [place] [address]    Special Requests   Final    NONE Performed at [facility], [place] [address] [place], [place] [address]    Culture (A)  Final    <10,000  COLONIES/mL INSIGNIFICANT GROWTH Performed at [facility] [address], [place],  [address]    Report Status 07/30/2019 FINAL  Final         Radiology Studies: ECHOCARDIOGRAM COMPLETE  Result Date: 07/30/2019   ECHOCARDIOGRAM REPORT   Patient Name:   NEREIDA SCHEPP Date of Exam: 07/30/2019 Medical Rec #:  [id number]       Height:       61.0 in Accession #:    [id number]      Weight:       169.6 lb Date of Birth:  [birth year]       BSA:          1.76 m Patient Age:    18 years        BP:           119/92 mmHg Patient Gender: F               HR:           132 bpm. Exam Location:  Inpatient Procedure: 2D Echo, Cardiac Doppler and Color  Doppler Indications:    Abnormal EKG  History:        Patient has no prior history of Echocardiogram examinations.                 Arrythmias:Atrial Fibrillation; Risk Factors:Hypertension and                 Dyslipidemia. Sepsis.  Sonographer:    Dustin Flock Referring Phys: [id number] [facility]  Sonographer Comments: Suboptimal parasternal window. Image acquisition challenging due to patient body habitus. IMPRESSIONS  1. Left ventricular ejection fraction, by visual estimation, is 55 to 60%. The left ventricle has normal function. Moderately increased left ventricular posterior wall thickness. There is no left ventricular hypertrophy.  2. Left ventricular diastolic parameters are indeterminate.  3. The left ventricle has no regional wall motion abnormalities.  4. Global right ventricle has normal systolic function.The right ventricular size is normal. No increase in right ventricular wall thickness.  5. Left atrial size was mildly dilated.  6. Right atrial size was normal.  7. The mitral valve is normal in structure. Mild mitral valve regurgitation.  8. The tricuspid valve is normal in structure. Tricuspid valve regurgitation is not demonstrated.  9. The aortic valve is tricuspid. Aortic valve regurgitation is mild. No evidence of aortic valve  sclerosis or stenosis 10. TR signal is inadequate for assessing pulmonary artery systolic pressure. 11. The inferior vena cava is normal in size with <50% respiratory variability, suggesting right atrial pressure of 8 mmHg. 12. The patient was in atrial fibrillation. 13. Technically difficult study with poor acoustic windows. FINDINGS  Left Ventricle: Left ventricular ejection fraction, by visual estimation, is 55 to 60%. The left ventricle has normal function. The left ventricle has no regional wall motion abnormalities. The left ventricular internal cavity size was the left ventricle is normal in size. Moderately increased left ventricular posterior wall thickness. There is no left ventricular hypertrophy. Left ventricular diastolic parameters are indeterminate. Right Ventricle: The right ventricular size is normal. No increase in right ventricular wall thickness. Global RV systolic function is has normal systolic function. Left Atrium: Left atrial size was mildly dilated. Right Atrium: Right atrial size was normal in size Pericardium: There is no evidence of pericardial effusion. Mitral Valve: The mitral valve is normal in structure. Mild mitral valve regurgitation. Tricuspid Valve: The tricuspid valve is normal in structure. Tricuspid valve regurgitation is not demonstrated. Aortic Valve: The aortic valve is tricuspid. Aortic valve regurgitation is mild. Aortic regurgitation PHT measures 664 msec. The aortic valve is structurally normal, with no evidence of sclerosis or stenosis. Pulmonic Valve: The pulmonic valve was normal in structure. Pulmonic valve regurgitation is not visualized. Pulmonic regurgitation is not visualized. Aorta: The aortic root is normal in size and structure. Venous: The inferior vena cava is normal in size with less than 50% respiratory variability, suggesting right atrial pressure of 8 mmHg. IAS/Shunts: No atrial level shunt detected by color flow Doppler.  LEFT VENTRICLE PLAX 2D LVIDd:          4.40 cm  Diastology LVIDs:         3.10 cm  LV e' lateral:   4.68 cm/s LV PW:         1.10 cm  LV E/e' lateral: 29.9 LV IVS:        0.90 cm  LV e' medial:    6.85 cm/s LVOT diam:     1.50 cm  LV E/e' medial:  20.4 LV SV:  50 ml LV SV Index:   26.89 LVOT Area:     1.77 cm  RIGHT VENTRICLE RV Basal diam:  2.10 cm RV S prime:     5.22 cm/s TAPSE (M-mode): 2.4 cm LEFT ATRIUM             Index       RIGHT ATRIUM           Index LA diam:        3.80 cm 2.16 cm/m  RA Area:     14.70 cm LA Vol (A2C):   46.6 ml 26.46 ml/m RA Volume:   35.40 ml  20.10 ml/m LA Vol (A4C):   52.0 ml 29.53 ml/m LA Biplane Vol: 50.5 ml 28.68 ml/m  AORTIC VALVE LVOT Vmax:   99.80 cm/s LVOT Vmean:  56.500 cm/s LVOT VTI:    0.158 m AI PHT:      664 msec  AORTA Ao Root diam: 2.50 cm MITRAL VALVE MV Area (PHT): 7.59 cm             SHUNTS MV PHT:        29.00 msec           Systemic VTI:  0.16 m MV Decel Time: 100 msec             Systemic Diam: 1.50 cm MV E velocity: 140.00 cm/s 103 cm/s  Loralie Champagne MD Electronically signed by Loralie Champagne MD Signature Date/Time: 07/30/2019/4:34:41 PM    Final    [place] EKG SITE RITE  Result Date: 07/31/2019 If Site Rite image not attached, placement could not be confirmed due to current cardiac rhythm.       Scheduled Meds: . aspirin EC  81 mg Oral Daily  . atorvastatin  40 mg Oral QPM  . folic acid  5 mg Oral Daily  . Melatonin  5 mg Oral QHS  . metoprolol tartrate  25 mg Oral BID  . pantoprazole  40 mg Oral Daily  . predniSONE  5 mg Oral Daily  . venlafaxine XR  150 mg Oral QPM   Continuous Infusions: . meropenem (MERREM) IV 1 g (07/31/19 1042)     LOS: 5 days    Time spent: 30 minutes    Barb Merino, MD [facility] Pager [phone]

## 2019-08-01 LAB — COMPREHENSIVE METABOLIC PANEL
ALT: 20 U/L (ref 0–44)
AST: 23 U/L (ref 15–41)
Albumin: 2.8 g/dL — ABNORMAL LOW (ref 3.5–5.0)
Alkaline Phosphatase: 93 U/L (ref 38–126)
Anion gap: 11 (ref 5–15)
BUN: 27 mg/dL — ABNORMAL HIGH (ref 8–23)
CO2: 18 mmol/L — ABNORMAL LOW (ref 22–32)
Calcium: 8.2 mg/dL — ABNORMAL LOW (ref 8.9–10.3)
Chloride: 108 mmol/L (ref 98–111)
Creatinine, Ser: 1.29 mg/dL — ABNORMAL HIGH (ref 0.44–1.00)
GFR calc Af Amer: 48 mL/min — ABNORMAL LOW (ref >60)
GFR calc non Af Amer: 41 mL/min — ABNORMAL LOW (ref >60)
Glucose, Bld: 75 mg/dL (ref 70–99)
Potassium: 4 mmol/L (ref 3.5–5.1)
Sodium: 137 mmol/L (ref 135–145)
Total Bilirubin: 1.1 mg/dL (ref 0.3–1.2)
Total Protein: 5.8 g/dL — ABNORMAL LOW (ref 6.5–8.1)

## 2019-08-01 LAB — CBC WITH DIFFERENTIAL/PLATELET
Abs Immature Granulocytes: 0.07 10*3/uL (ref 0.00–0.07)
Basophils Absolute: 0 10*3/uL (ref 0.0–0.1)
Basophils Relative: 0 %
Eosinophils Absolute: 0.1 10*3/uL (ref 0.0–0.5)
Eosinophils Relative: 1 %
HCT: 29 % — ABNORMAL LOW (ref 36.0–46.0)
Hemoglobin: 9.1 g/dL — ABNORMAL LOW (ref 12.0–15.0)
Immature Granulocytes: 1 %
Lymphocytes Relative: 7 %
Lymphs Abs: 0.5 10*3/uL — ABNORMAL LOW (ref 0.7–4.0)
MCH: 35.8 pg — ABNORMAL HIGH (ref 26.0–34.0)
MCHC: 31.4 g/dL (ref 30.0–36.0)
MCV: 114.2 fL — ABNORMAL HIGH (ref 80.0–100.0)
Monocytes Absolute: 0.4 10*3/uL (ref 0.1–1.0)
Monocytes Relative: 5 %
Neutro Abs: 5.9 10*3/uL (ref 1.7–7.7)
Neutrophils Relative %: 86 %
Platelets: 79 10*3/uL — ABNORMAL LOW (ref 150–400)
RBC: 2.54 MIL/uL — ABNORMAL LOW (ref 3.87–5.11)
RDW: 15.6 % — ABNORMAL HIGH (ref 11.5–15.5)
WBC: 6.9 10*3/uL (ref 4.0–10.5)
nRBC: 0 % (ref 0.0–0.2)

## 2019-08-01 LAB — CULTURE, BLOOD (ROUTINE X 2): Special Requests: ADEQUATE

## 2019-08-01 MED ORDER — SODIUM CHLORIDE 0.9% FLUSH: 10.0000 mL | Freq: Two times a day (BID) | INTRAVENOUS | Status: DC

## 2019-08-01 MED ORDER — SODIUM CHLORIDE 0.9% FLUSH: 10.0000 mL | INTRAVENOUS | Status: DC | PRN

## 2019-08-01 MED ORDER — METOPROLOL TARTRATE 25 MG PO TABS: 25.0000 mg | ORAL_TABLET | Freq: Two times a day (BID) | ORAL | 0 refills | Status: DC

## 2019-08-01 MED ORDER — CHLORHEXIDINE GLUCONATE CLOTH 2 % EX PADS: 6.0000 | MEDICATED_PAD | Freq: Every day | CUTANEOUS | Status: DC

## 2019-08-01 MED ORDER — ERTAPENEM IV (FOR PTA / DISCHARGE USE ONLY): 1.0000 g | INTRAVENOUS | 0 refills | Status: AC

## 2019-08-01 NOTE — Progress Notes (Signed)
Progress Note  Patient Name: Alexis Murphy Date of Encounter: 08/01/2019  Primary Cardiologist: Pixie Casino, MD   Subjective   No CP or dyspnea  Inpatient Medications    Scheduled Meds: . aspirin EC  81 mg Oral Daily  . atorvastatin  40 mg Oral QPM  . folic acid  5 mg Oral Daily  . Melatonin  5 mg Oral QHS  . metoprolol tartrate  25 mg Oral BID  . pantoprazole  40 mg Oral Daily  . predniSONE  5 mg Oral Daily  . venlafaxine XR  150 mg Oral QPM   Continuous Infusions: . ertapenem     PRN Meds: hydrALAZINE   Vital Signs    Vitals:   07/31/19 0445 07/31/19 1337 07/31/19 2100 08/01/19 0543  BP: 138/78 127/73 120/70 (!) 154/71  Pulse: (!) 102 91 95 99  Resp: 20 20 20 17   Temp: 97.8 F (36.6 C) 97.9 F (36.6 C) 98 F (36.7 C) 98.1 F (36.7 C)  TempSrc: Oral Oral Oral Oral  SpO2: 99% 98% 99% 100%  Weight:      Height:        Intake/Output Summary (Last 24 hours) at 08/01/2019 0716 Last data filed at 08/01/2019 0300 Gross per 24 hour  Intake 1422.88 ml  Output 700 ml  Net 722.88 ml   Last 3 Weights 07/27/2019 07/26/2019 06/29/2019  Weight (lbs) 169 lb 9.6 oz 152 lb 164 lb 4.8 oz  Weight (kg) 76.93 kg 68.947 kg 74.526 kg      Telemetry    Sinus with PVCs and couplet- Personally Reviewed   Physical Exam   GEN: No acute distress.   Neck: No JVD Cardiac: RRR, no murmurs, rubs, or gallops.  Respiratory: Clear to auscultation bilaterally. GI: Soft, nontender, non-distended  MS: No edema. Neuro:  Nonfocal  Psych: Normal affect   Labs    High Sensitivity Troponin:   Recent Labs  Lab 07/26/19 1719  TROPONINIHS 28*      Chemistry Recent Labs  Lab 07/29/19 0341 07/29/19 0341 07/30/19 0454 07/31/19 0511 08/01/19 0518  NA 139   < > 138 140 137  K 3.4*   < > 3.4* 5.2* 4.0  CL 111   < > 110 116* 108  CO2 17*   < > 17* 16* 18*  GLUCOSE 93   < > 92 76 75  BUN 30*   < > 30* 31* 27*  CREATININE 1.52*   < > 1.66* 1.55* 1.29*  CALCIUM 8.0*   <  > 7.7* 8.0* 8.2*  PROT 5.7*  --   --  5.7* 5.8*  ALBUMIN 2.7*  --   --  2.5* 2.8*  AST 17  --   --  18 23  ALT 18  --   --  20 20  ALKPHOS 75  --   --  93 93  BILITOT 1.3*  --   --  0.7 1.1  GFRNONAA 34*   < > 30* 33* 41*  GFRAA 39*   < > 35* 38* 48*  ANIONGAP 11   < > 11 8 11    < > = values in this interval not displayed.     Hematology Recent Labs  Lab 07/30/19 0454 07/30/19 0454 07/30/19 1352 07/31/19 0511 08/01/19 0518  WBC 6.5  --   --  9.6 6.9  RBC 2.30*  --   --  2.55* 2.54*  HGB 8.3*  --   --  9.2* 9.1*  HCT 26.7*  --   --  29.6* 29.0*  MCV 116.1*  --   --  116.1* 114.2*  MCH 36.1*  --   --  36.1* 35.8*  MCHC 31.1  --   --  31.1 31.4  RDW 15.8*  --   --  15.9* 15.6*  PLT 69*   < > 68* 73* 79*   < > = values in this interval not displayed.     DDimer  Recent Labs  Lab 07/26/19 1719 07/30/19 1352  DDIMER 8.64* 7.02*     Radiology    ECHOCARDIOGRAM COMPLETE  Result Date: 07/30/2019   ECHOCARDIOGRAM REPORT   Patient Name:   RYLYN ZAWISTOWSKI Date of Exam: 07/30/2019 Medical Rec #:  [id number]       Height:       61.0 in Accession #:    [id number]      Weight:       169.6 lb Date of Birth:  [birth year]       BSA:          1.76 m Patient Age:    51 years        BP:           119/92 mmHg Patient Gender: F               HR:           132 bpm. Exam Location:  Inpatient Procedure: 2D Echo, Cardiac Doppler and Color Doppler Indications:    Abnormal EKG  History:        Patient has no prior history of Echocardiogram examinations.                 Arrythmias:Atrial Fibrillation; Risk Factors:Hypertension and                 Dyslipidemia. Sepsis.  Sonographer:    Dustin Flock Referring Phys: [id number] [facility]  Sonographer Comments: Suboptimal parasternal window. Image acquisition challenging due to patient body habitus. IMPRESSIONS  1. Left ventricular ejection fraction, by visual estimation, is 55 to 60%. The left ventricle has normal function. Moderately increased left  ventricular posterior wall thickness. There is no left ventricular hypertrophy.  2. Left ventricular diastolic parameters are indeterminate.  3. The left ventricle has no regional wall motion abnormalities.  4. Global right ventricle has normal systolic function.The right ventricular size is normal. No increase in right ventricular wall thickness.  5. Left atrial size was mildly dilated.  6. Right atrial size was normal.  7. The mitral valve is normal in structure. Mild mitral valve regurgitation.  8. The tricuspid valve is normal in structure. Tricuspid valve regurgitation is not demonstrated.  9. The aortic valve is tricuspid. Aortic valve regurgitation is mild. No evidence of aortic valve sclerosis or stenosis 10. TR signal is inadequate for assessing pulmonary artery systolic pressure. 11. The inferior vena cava is normal in size with <50% respiratory variability, suggesting right atrial pressure of 8 mmHg. 12. The patient was in atrial fibrillation. 13. Technically difficult study with poor acoustic windows. FINDINGS  Left Ventricle: Left ventricular ejection fraction, by visual estimation, is 55 to 60%. The left ventricle has normal function. The left ventricle has no regional wall motion abnormalities. The left ventricular internal cavity size was the left ventricle is normal in size. Moderately increased left ventricular posterior wall thickness. There is no left ventricular hypertrophy. Left ventricular diastolic parameters are indeterminate. Right Ventricle: The right ventricular size is normal. No increase  in right ventricular wall thickness. Global RV systolic function is has normal systolic function. Left Atrium: Left atrial size was mildly dilated. Right Atrium: Right atrial size was normal in size Pericardium: There is no evidence of pericardial effusion. Mitral Valve: The mitral valve is normal in structure. Mild mitral valve regurgitation. Tricuspid Valve: The tricuspid valve is normal in structure.  Tricuspid valve regurgitation is not demonstrated. Aortic Valve: The aortic valve is tricuspid. Aortic valve regurgitation is mild. Aortic regurgitation PHT measures 664 msec. The aortic valve is structurally normal, with no evidence of sclerosis or stenosis. Pulmonic Valve: The pulmonic valve was normal in structure. Pulmonic valve regurgitation is not visualized. Pulmonic regurgitation is not visualized. Aorta: The aortic root is normal in size and structure. Venous: The inferior vena cava is normal in size with less than 50% respiratory variability, suggesting right atrial pressure of 8 mmHg. IAS/Shunts: No atrial level shunt detected by color flow Doppler.  LEFT VENTRICLE PLAX 2D LVIDd:         4.40 cm  Diastology LVIDs:         3.10 cm  LV e' lateral:   4.68 cm/s LV PW:         1.10 cm  LV E/e' lateral: 29.9 LV IVS:        0.90 cm  LV e' medial:    6.85 cm/s LVOT diam:     1.50 cm  LV E/e' medial:  20.4 LV SV:         50 ml LV SV Index:   26.89 LVOT Area:     1.77 cm  RIGHT VENTRICLE RV Basal diam:  2.10 cm RV S prime:     5.22 cm/s TAPSE (M-mode): 2.4 cm LEFT ATRIUM             Index       RIGHT ATRIUM           Index LA diam:        3.80 cm 2.16 cm/m  RA Area:     14.70 cm LA Vol (A2C):   46.6 ml 26.46 ml/m RA Volume:   35.40 ml  20.10 ml/m LA Vol (A4C):   52.0 ml 29.53 ml/m LA Biplane Vol: 50.5 ml 28.68 ml/m  AORTIC VALVE LVOT Vmax:   99.80 cm/s LVOT Vmean:  56.500 cm/s LVOT VTI:    0.158 m AI PHT:      664 msec  AORTA Ao Root diam: 2.50 cm MITRAL VALVE MV Area (PHT): 7.59 cm             SHUNTS MV PHT:        29.00 msec           Systemic VTI:  0.16 m MV Decel Time: 100 msec             Systemic Diam: 1.50 cm MV E velocity: 140.00 cm/s 103 cm/s  Loralie Champagne MD Electronically signed by Loralie Champagne MD Signature Date/Time: 07/30/2019/4:34:41 PM    Final    [place] EKG SITE RITE  Result Date: 07/31/2019 If Site Rite image not attached, placement could not be confirmed due to current cardiac  rhythm.   Patient Profile     74 y.o. 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    Assessment & Plan    1 Paroxysmal atrial fibrillation-patient  remains in sinus rhythm today on telemetry.  Atrial fibrillation is felt possibly secondary to stress of sepsis.  Continue metoprolol at present dose.  She has not been anticoagulated due to thrombocytopenia.  Dr. Debara Pickett plans outpatient monitor to assess atrial fibrillation burden.  If no recurrences plan will be to not anticoagulate long-term.  LV function is normal.  2 sepsis-antibiotics per primary care.  3 thrombocytopenia-likely secondary to sepsis.  Improving.  4 hypertension-blood pressure controlled.  Continue present medications and follow.  We will see again Monday if patient remains in the hospital.  Please call prior to that with questions.  For questions or updates, please contact [place] Please consult [email or web address] for contact info under        Signed, Kirk Ruths, MD  08/01/2019, 7:16 AM

## 2019-08-01 NOTE — Progress Notes (Signed)
WEnt over AVS with patient, all questions answered.  PICC placed and HH needs set up.  Transported home by son, Larene Beach.

## 2019-08-01 NOTE — Progress Notes (Signed)
Peripherally Inserted Central Catheter/Midline Placement  The IV Nurse has discussed with the patient and/or persons authorized to consent for the patient, the purpose of this procedure and the potential benefits and risks involved with this procedure.  The benefits include less needle sticks, lab draws from the catheter, and the patient may be discharged home with the catheter. Risks include, but not limited to, infection, bleeding, blood clot (thrombus formation), and puncture of an artery; nerve damage and irregular heartbeat and possibility to perform a PICC exchange if needed/ordered by physician.  Alternatives to this procedure were also discussed.  B[employer] Power PICC patient education guide, fact sheet on infection prevention and patient information card has been provided to patient /or left at bedside.    PICC/Midline Placement Documentation  PICC Single Lumen 08/01/19 PICC Right Brachial 35 cm 0 cm (Active)  Indication for Insertion or Continuance of Line Home intravenous therapies (PICC only) 08/01/19 1358  Exposed Catheter (cm) 0 cm 08/01/19 1358  Site Assessment Clean;Dry;Intact 08/01/19 1358  Line Status Flushed;Saline locked;Blood return noted 08/01/19 1358  Dressing Type Transparent 08/01/19 1358  Dressing Status Clean;Dry;Antimicrobial disc in place;Intact 08/01/19 1358  Dressing Change Due 08/08/19 08/01/19 1358       Gordan Payment 08/01/2019, 1:59 PM

## 2019-08-01 NOTE — Discharge Summary (Signed)
Physician Discharge Summary  Alexis Murphy AVW:[id number] DOB: [birth year] DOA: 07/26/2019  PCP: Lawerance Cruel, MD  Admit date: 07/26/2019 Discharge date: 08/01/2019  Admitted From: Home. Disposition: Home with IV infusion therapy.  Recommendations for Outpatient Follow-up:  1. Follow up with PCP in 1-2 weeks 2. Please obtain BMP/CBC in one week   Home Health: Home infusion Equipment/Devices: IV infusion  Discharge Condition: Stable CODE STATUS: Full code Diet recommendation: Low-salt diet  Discharge summary: 74 year old female with hypertension, hyperlipidemia, sarcoidosis on prednisone hydroxyurea and methotrexate therapy, recent COVID-19 infection in September 2020 presented to the emergency room with generalized weakness, shortness of breath, nausea and vomiting.  In the emergency room blood pressures were stable.  Heart rate was 117.  WBC count was 16.9.  Hypokalemic.  Creatinine 2.5.  Procalcitonin 134.  Chest x-ray showed reticular nodular opacity of the right lung.  She was treated with IV fluids, Decadron and admitted with IV antibiotics.  Patient was treated for following conditions:  Sepsis present on admission, resolved on discharge.  ESBL E. coli bacteremia with primary source unknown.  CT scan abdomen pelvis with essentially normal findings.  Urine culture not drawn on admission, subsequent urine cultures negative.  Does have history of sarcoidosis, abnormal chest x-ray less likely pneumonia. With clinical improvement, as per ID recommendation, PICC line was placed and patient will be going home with 14 days of IV ertapenem, today is day 3/14.  New onset A. fib with RVR: Patient developed rapid A. fib and was treated with different medications including Cardizem infusion and amiodarone infusion and beta-blockers with improvement and now remains sinus rhythm and asymptomatic.  Discharging on oral beta-blockers.  Outpatient cardiology follow-up.  Aspirin was resumed.   Therapeutic anticoagulation was not initiated because of thrombocytopenia.  Echocardiogram was normal.  Pancytopenia: Patient developed pancytopenia related to sepsis which is already improving and normalizing.  Platelets were up trending.  Negative for DIC panel.  Negative for heparin antibody. Methotrexate and hydroxyurea was stopped.  She will continue folic acid. We will need repeat CBC and BMP in 1 week.  Methotrexate will be resumed after follow-up with her endocrine.  Received PICC line today.  Remains a stable.  Has adequate support at home, she lives with her son.  Home infusion therapy as a scheduled to come tomorrow.  Discharge Diagnoses:  Principal Problem:   Bacteremia due to Gram-negative bacteria Active Problems:   Pneumonia   Hypokalemia   AKI (acute kidney injury) ([place])   Sepsis ([place])   New onset a-fib [facility])    Discharge Instructions  Discharge Instructions    Call MD for:  difficulty breathing, headache or visual disturbances   Complete by: As directed    Call MD for:  temperature >100.4   Complete by: As directed    Diet - low sodium heart healthy   Complete by: As directed    Discharge instructions   Complete by: As directed    Repeat CBC and BMP in one week Stop taking methotrexate and hydrea until seen in follow up   Home infusion instructions   Complete by: As directed    Instructions: Flushing of vascular access device: 0.9% NaCl pre/post medication administration and prn patency; Heparin 100 u/ml, 42ml for implanted ports and Heparin 10u/ml, 34ml for all other central venous catheters.   Increase activity slowly   Complete by: As directed      Allergies as of 08/01/2019      Reactions   Penicillins Anaphylaxis, Rash  Did it involve swelling of the face/tongue/throat, SOB, or low BP? Yes  Did it involve sudden or severe rash/hives, skin peeling, or any reaction on the inside of your mouth or nose? No Did you need to seek medical attention at a  hospital or doctor's office? No When did it last happen? Childhood If all above answers are "NO", may proceed with cephalosporin use.   Codeine Other (See Comments)   Vomiting      Medication List    STOP taking these medications   hydroxyurea 500 MG capsule Commonly known as: HYDREA   methotrexate 2.5 MG tablet Commonly known as: RHEUMATREX     TAKE these medications   acetaminophen 325 MG tablet Commonly known as: TYLENOL Take 1 tablet (325 mg total) by mouth every 6 (six) hours. What changed:   when to take this  reasons to take this   amLODipine 5 MG tablet Commonly known as: NORVASC Take 5 mg by mouth every evening.   aspirin EC 81 MG tablet Take 81 mg by mouth daily.   atorvastatin 40 MG tablet Commonly known as: LIPITOR Take 40 mg by mouth every evening.   ertapenem  IVPB Commonly known as: INVANZ Inject 1 g into the vein daily for 10 days. Indication: ESBL E. Coli bacteremia Last Day of Therapy: 08/12/2019 Labs - Once weekly:  CBC/D and BMP Start taking on: August 01, 3660   folic acid 1 MG tablet Commonly known as: FOLVITE Take 1 mg by mouth daily.   Melatonin 5 MG Tabs Take 5 mg by mouth at bedtime.   metoprolol tartrate 25 MG tablet Commonly known as: LOPRESSOR Take 1 tablet (25 mg total) by mouth 2 (two) times daily.   omeprazole 40 MG capsule Commonly known as: PRILOSEC Take 40 mg by mouth every evening.   predniSONE 5 MG tablet Commonly known as: DELTASONE Take 5 mg by mouth daily.   venlafaxine XR 150 MG 24 hr capsule Commonly known as: EFFEXOR-XR Take 150 mg by mouth every evening.            Home Infusion Instuctions  (From admission, onward)         Start     Ordered   08/01/19 0000  Home infusion instructions    Question:  Instructions  Answer:  Flushing of vascular access device: 0.9% NaCl pre/post medication administration and prn patency; Heparin 100 u/ml, 60ml for implanted ports and Heparin 10u/ml, 24ml for all other  central venous catheters.   08/01/19 [facility] Follow up.   Specialty: [place] Why: [facility] physical therapy Contact information: [place] [address] [phone]        [facility] Follow up.   Why: iv abx,med supplies-they will arange [facility] for HHRN-iv abx.         Allergies  Allergen Reactions  . Penicillins Anaphylaxis and Rash    Did it involve swelling of the face/tongue/throat, SOB, or low BP? Yes  Did it involve sudden or severe rash/hives, skin peeling, or any reaction on the inside of your mouth or nose? No Did you need to seek medical attention at a hospital or doctor's office? No When did it last happen? Childhood If all above answers are "NO", may proceed with cephalosporin use.   . Codeine Other (See Comments)    Vomiting     Consultations:  Infectious  disease  Cardiology   Procedures/Studies: DG Chest Port 1 View  Result Date: 07/26/2019 CLINICAL DATA:  Shortness of breath, vomiting EXAM: PORTABLE CHEST 1 VIEW COMPARISON:  Radiograph 10/29/2017, CT 07/17/2018 FINDINGS: Slight left anterior obliquity accentuating the cardiomediastinal silhouette and superimposing the mediastinum over the right lung. Coarsened interstitial changes are similar to comparison is. Some new reticulonodular opacity seen in the right mid lung. No pneumothorax. No effusion. The aorta is calcified. The remaining cardiomediastinal contours are unremarkable accounting for technique and rotation. No acute osseous or soft tissue abnormality. Degenerative changes are present in the imaged spine and shoulders. IMPRESSION: 1. New reticulonodular opacity seen in the right mid lung which may represent pneumonia. Recommend follow-up imaging to resolution. 2. Stable chronic interstitial changes. 3.  Aortic Atherosclerosis (ICD10-I70.0). Electronically Signed   By: Lovena Le M.D.   On:  07/26/2019 17:23   ECHOCARDIOGRAM COMPLETE  Result Date: 07/30/2019   ECHOCARDIOGRAM REPORT   Patient Name:   ANNALISIA INGBER Date of Exam: 07/30/2019 Medical Rec #:  [id number]       Height:       61.0 in Accession #:    [id number]      Weight:       169.6 lb Date of Birth:  1946-06-07       BSA:          1.76 m Patient Age:    37 years        BP:           119/92 mmHg Patient Gender: F               HR:           132 bpm. Exam Location:  Inpatient Procedure: 2D Echo, Cardiac Doppler and Color Doppler Indications:    Abnormal EKG  History:        Patient has no prior history of Echocardiogram examinations.                 Arrythmias:Atrial Fibrillation; Risk Factors:Hypertension and                 Dyslipidemia. Sepsis.  Sonographer:    Dustin Flock Referring Phys: [id number] [facility]  Sonographer Comments: Suboptimal parasternal window. Image acquisition challenging due to patient body habitus. IMPRESSIONS  1. Left ventricular ejection fraction, by visual estimation, is 55 to 60%. The left ventricle has normal function. Moderately increased left ventricular posterior wall thickness. There is no left ventricular hypertrophy.  2. Left ventricular diastolic parameters are indeterminate.  3. The left ventricle has no regional wall motion abnormalities.  4. Global right ventricle has normal systolic function.The right ventricular size is normal. No increase in right ventricular wall thickness.  5. Left atrial size was mildly dilated.  6. Right atrial size was normal.  7. The mitral valve is normal in structure. Mild mitral valve regurgitation.  8. The tricuspid valve is normal in structure. Tricuspid valve regurgitation is not demonstrated.  9. The aortic valve is tricuspid. Aortic valve regurgitation is mild. No evidence of aortic valve sclerosis or stenosis 10. TR signal is inadequate for assessing pulmonary artery systolic pressure. 11. The inferior vena cava is normal in size with <50% respiratory  variability, suggesting right atrial pressure of 8 mmHg. 12. The patient was in atrial fibrillation. 13. Technically difficult study with poor acoustic windows. FINDINGS  Left Ventricle: Left ventricular ejection fraction, by visual estimation, is 55 to 60%. The left ventricle has normal function. The left  ventricle has no regional wall motion abnormalities. The left ventricular internal cavity size was the left ventricle is normal in size. Moderately increased left ventricular posterior wall thickness. There is no left ventricular hypertrophy. Left ventricular diastolic parameters are indeterminate. Right Ventricle: The right ventricular size is normal. No increase in right ventricular wall thickness. Global RV systolic function is has normal systolic function. Left Atrium: Left atrial size was mildly dilated. Right Atrium: Right atrial size was normal in size Pericardium: There is no evidence of pericardial effusion. Mitral Valve: The mitral valve is normal in structure. Mild mitral valve regurgitation. Tricuspid Valve: The tricuspid valve is normal in structure. Tricuspid valve regurgitation is not demonstrated. Aortic Valve: The aortic valve is tricuspid. Aortic valve regurgitation is mild. Aortic regurgitation PHT measures 664 msec. The aortic valve is structurally normal, with no evidence of sclerosis or stenosis. Pulmonic Valve: The pulmonic valve was normal in structure. Pulmonic valve regurgitation is not visualized. Pulmonic regurgitation is not visualized. Aorta: The aortic root is normal in size and structure. Venous: The inferior vena cava is normal in size with less than 50% respiratory variability, suggesting right atrial pressure of 8 mmHg. IAS/Shunts: No atrial level shunt detected by color flow Doppler.  LEFT VENTRICLE PLAX 2D LVIDd:         4.40 cm  Diastology LVIDs:         3.10 cm  LV e' lateral:   4.68 cm/s LV PW:         1.10 cm  LV E/e' lateral: 29.9 LV IVS:        0.90 cm  LV e' medial:     6.85 cm/s LVOT diam:     1.50 cm  LV E/e' medial:  20.4 LV SV:         50 ml LV SV Index:   26.89 LVOT Area:     1.77 cm  RIGHT VENTRICLE RV Basal diam:  2.10 cm RV S prime:     5.22 cm/s TAPSE (M-mode): 2.4 cm LEFT ATRIUM             Index       RIGHT ATRIUM           Index LA diam:        3.80 cm 2.16 cm/m  RA Area:     14.70 cm LA Vol (A2C):   46.6 ml 26.46 ml/m RA Volume:   35.40 ml  20.10 ml/m LA Vol (A4C):   52.0 ml 29.53 ml/m LA Biplane Vol: 50.5 ml 28.68 ml/m  AORTIC VALVE LVOT Vmax:   99.80 cm/s LVOT Vmean:  56.500 cm/s LVOT VTI:    0.158 m AI PHT:      664 msec  AORTA Ao Root diam: 2.50 cm MITRAL VALVE MV Area (PHT): 7.59 cm             SHUNTS MV PHT:        29.00 msec           Systemic VTI:  0.16 m MV Decel Time: 100 msec             Systemic Diam: 1.50 cm MV E velocity: 140.00 cm/s 103 cm/s  Loralie Champagne MD Electronically signed by Loralie Champagne MD Signature Date/Time: 07/30/2019/4:34:41 PM    Final    CT Renal Stone Study  Result Date: 07/26/2019 CLINICAL DATA:  74 year old female with abdominal discomfort, nausea vomiting. Flank pain. EXAM: CT ABDOMEN AND PELVIS WITHOUT CONTRAST TECHNIQUE: Multidetector CT imaging  of the abdomen and pelvis was performed following the standard protocol without IV contrast. COMPARISON:  CT abdomen pelvis dated 07/17/2018. FINDINGS: Evaluation of this exam is limited in the absence of intravenous contrast. Lower chest: The visualized lung bases are clear. Multi vessel coronary vascular calcification noted. No intra-abdominal free air or free fluid. Hepatobiliary: Mild fatty infiltration of the liver. No intrahepatic biliary ductal dilatation. Several stones noted within the gallbladder. No pericholecystic fluid or evidence of acute cholecystitis by CT. Pancreas: Unremarkable. No pancreatic ductal dilatation or surrounding inflammatory changes. Spleen: Normal in size without focal abnormality. Adrenals/Urinary Tract: A 17 mm left adrenal nodule, likely an  adenoma. The right adrenal gland is unremarkable. There is no hydronephrosis or nephrolithiasis on either side. A 2 cm right renal inferior pole hypodense lesion is not characterized on this noncontrast CT but appears similar to prior CT. Mild bilateral perinephric stranding, nonspecific. Correlation with urinalysis recommended to evaluate for UTI. The urinary bladder is grossly unremarkable. Stomach/Bowel: There is a small hiatal hernia. Scattered colonic diverticula without active inflammatory changes. There is no bowel obstruction or active inflammation. The appendix is normal. Vascular/Lymphatic: Advanced aortoiliac atherosclerotic disease. The IVC is unremarkable. No portal venous gas. There is no adenopathy. Reproductive: Hysterectomy.  No adnexal masses. Other: None Musculoskeletal: Osteopenia with multilevel degenerative changes of the spine with disc desiccation and vacuum phenomena. No acute osseous pathology. IMPRESSION: 1. No hydronephrosis or nephrolithiasis. Correlation with urinalysis recommended to evaluate for UTI. 2. Cholelithiasis. 3. No bowel obstruction or active inflammation.  Normal appendix. 4. Scattered colonic diverticula without active inflammatory changes. 5. Coronary vascular calcification and Aortic Atherosclerosis (ICD10-I70.0). Electronically Signed   By: Anner Crete M.D.   On: 07/26/2019 19:08   [place] EKG SITE RITE  Result Date: 07/31/2019 If Site Rite image not attached, placement could not be confirmed due to current cardiac rhythm.    Subjective: Patient seen and examined.  No overnight events.  Remains in sinus rhythms with rate control.  Eager to go home.   Discharge Exam: Vitals:   08/01/19 0543 08/01/19 1314  BP: (!) 154/71 135/75  Pulse: 99 89  Resp: 17 19  Temp: 98.1 F (36.7 C) 98.6 F (37 C)  SpO2: 100% 98%   Vitals:   07/31/19 1337 07/31/19 2100 08/01/19 0543 08/01/19 1314  BP: 127/73 120/70 (!) 154/71 135/75  Pulse: 91 95 99 89  Resp: 20 20 17  19   Temp: 97.9 F (36.6 C) 98 F (36.7 C) 98.1 F (36.7 C) 98.6 F (37 C)  TempSrc: Oral Oral Oral Oral  SpO2: 98% 99% 100% 98%  Weight:      Height:        General: Pt is alert, awake, not in acute distress Cardiovascular: RRR, S1/S2 +, no rubs, no gallops Respiratory: CTA bilaterally, no wheezing, no rhonchi Abdominal: Soft, NT, ND, bowel sounds + Extremities: no edema, no cyanosis    The results of significant diagnostics from this hospitalization (including imaging, microbiology, ancillary and laboratory) are listed below for reference.     Microbiology: Recent Results (from the past 240 hour(s))  SARS CORONAVIRUS 2 (TAT 6-24 HRS) Nasopharyngeal Nasopharyngeal Swab     Status: None   Collection Time: 07/26/19  5:03 PM   Specimen: Nasopharyngeal Swab  Result Value Ref Range Status   SARS Coronavirus 2 NEGATIVE NEGATIVE Final    Comment: (NOTE) SARS-CoV-2 target nucleic acids are NOT DETECTED. The SARS-CoV-2 RNA is generally detectable in upper and lower respiratory specimens during the  acute phase of infection. Negative results do not preclude SARS-CoV-2 infection, do not rule out co-infections with other pathogens, and should not be used as the sole basis for treatment or other patient management decisions. Negative results must be combined with clinical observations, patient history, and epidemiological information. The expected result is Negative. Fact Sheet for Patients: SugarRoll.be Fact Sheet for Healthcare Providers: https://www.woods-mathews.com/ This test is not yet approved or cleared by the [place] FDA and  has been authorized for detection and/or diagnosis of SARS-CoV-2 by FDA under an Emergency Use Authorization (EUA). This EUA will remain  in effect (meaning this test can be used) for the duration of the COVID-19 declaration under Section 56 4(b)(1) of the Act, 21 U.S.C. section 360bbb-3(b)(1), unless the  authorization is terminated or revoked sooner. Performed at [facility], [place] [address], [place], [place] [address]   Blood Culture (routine x 2)     Status: Abnormal   Collection Time: 07/26/19  5:19 PM   Specimen: BLOOD LEFT HAND  Result Value Ref Range Status   Specimen Description   Final    BLOOD LEFT HAND Performed at [facility] [address] [place], [place] [address]    Special Requests   Final    BOTTLES DRAWN AEROBIC AND ANAEROBIC Blood Culture adequate volume Performed at [facility] [address], [place], [place] [address]    Culture  Setup Time   Final    GRAM NEGATIVE RODS IN BOTH AEROBIC AND ANAEROBIC BOTTLES CRITICAL RESULT CALLED TO, READ BACK BY AND VERIFIED WITH: Shelda Jakes PharmD 15:25 07/27/19 ([email or web address]) Performed at [facility], [place] [address], [place],  [address]    Culture (A)  Final    ESCHERICHIA COLI Confirmed Extended Spectrum Beta-Lactamase Producer (ESBL).  In bloodstream infections from ESBL organisms, carbapenems are preferred over piperacillin/tazobactam. They are shown to have a lower risk of mortality.    Report Status 07/29/2019 FINAL  Final   Organism ID, Bacteria ESCHERICHIA COLI  Final      Susceptibility   Escherichia coli - MIC*    AMPICILLIN >=32 RESISTANT Resistant     CEFAZOLIN >=64 RESISTANT Resistant     CEFEPIME 16 RESISTANT Resistant     CEFTAZIDIME RESISTANT Resistant     CEFTRIAXONE >=64 RESISTANT Resistant     CIPROFLOXACIN >=4 RESISTANT Resistant     GENTAMICIN >=16 RESISTANT Resistant     IMIPENEM <=0.25 SENSITIVE Sensitive     TRIMETH/SULFA >=320 RESISTANT Resistant     AMPICILLIN/SULBACTAM >=32 RESISTANT Resistant     PIP/TAZO <=4 SENSITIVE Sensitive     * ESCHERICHIA COLI  Blood Culture ID Panel (Reflexed)     Status: Abnormal   Collection Time: 07/26/19  5:19 PM  Result Value Ref Range Status   Enterococcus species NOT DETECTED NOT DETECTED Final    Listeria monocytogenes NOT DETECTED NOT DETECTED Final   Staphylococcus species NOT DETECTED NOT DETECTED Final   Staphylococcus aureus (BCID) NOT DETECTED NOT DETECTED Final   Streptococcus species NOT DETECTED NOT DETECTED Final   Streptococcus agalactiae NOT DETECTED NOT DETECTED Final   Streptococcus pneumoniae NOT DETECTED NOT DETECTED Final   Streptococcus pyogenes NOT DETECTED NOT DETECTED Final   Acinetobacter baumannii NOT DETECTED NOT DETECTED Final   Enterobacteriaceae species DETECTED (A) NOT DETECTED Final    Comment: Enterobacteriaceae represent a large family of gram-negative bacteria, not a single organism. CRITICAL RESULT CALLED TO, READ BACK BY AND VERIFIED WITH: M.  Swayne PharmD 15:25 07/27/19 ([email or web address])    Enterobacter cloacae complex NOT DETECTED NOT DETECTED Final   Escherichia coli DETECTED (A) NOT DETECTED Final    Comment: CRITICAL RESULT CALLED TO, READ BACK BY AND VERIFIED WITH: Shelda Jakes PharmD 15:25 07/27/19 (wilsonm)    Klebsiella oxytoca NOT DETECTED NOT DETECTED Final   Klebsiella pneumoniae NOT DETECTED NOT DETECTED Final   Proteus species NOT DETECTED NOT DETECTED Final   Serratia marcescens NOT DETECTED NOT DETECTED Final   Carbapenem resistance NOT DETECTED NOT DETECTED Final   Haemophilus influenzae NOT DETECTED NOT DETECTED Final   Neisseria meningitidis NOT DETECTED NOT DETECTED Final   Pseudomonas aeruginosa NOT DETECTED NOT DETECTED Final   Candida albicans NOT DETECTED NOT DETECTED Final   Candida glabrata NOT DETECTED NOT DETECTED Final   Candida krusei NOT DETECTED NOT DETECTED Final   Candida parapsilosis NOT DETECTED NOT DETECTED Final   Candida tropicalis NOT DETECTED NOT DETECTED Final    Comment: Performed at [facility], [place] [address] [place], [place] [address]  Blood Culture (routine x 2)     Status: Abnormal   Collection Time: 07/26/19  5:24 PM   Specimen: BLOOD RIGHT FOREARM  Result Value Ref Range Status   Specimen  Description   Final    BLOOD RIGHT FOREARM Performed at [facility] [address] [place], [place] [address]    Special Requests   Final    BOTTLES DRAWN AEROBIC AND ANAEROBIC Blood Culture adequate volume Performed at [facility] [address] [place], [place] [address]    Culture  Setup Time   Final    GRAM NEGATIVE RODS IN BOTH AEROBIC AND ANAEROBIC BOTTLES CRITICAL VALUE NOTED.  VALUE IS CONSISTENT WITH PREVIOUSLY REPORTED AND CALLED VALUE.    Culture (A)  Final    ESCHERICHIA COLI SUSCEPTIBILITIES PERFORMED ON PREVIOUS CULTURE WITHIN THE LAST 5 DAYS. Performed at [facility], [place] [address] [place], [place] [address]    Report Status 07/29/2019 FINAL  Final  Culture, blood (routine x 2)     Status: Abnormal   Collection Time: 07/29/19  8:04 AM   Specimen: BLOOD  Result Value Ref Range Status   Specimen Description   Final    BLOOD RIGHT THUMB Performed at [facility] [address], [place], [place] [address]    Special Requests   Final    BOTTLES DRAWN AEROBIC ONLY Blood Culture adequate volume Performed at [facility] [address], [place], [place] [address]    Culture  Setup Time   Final    AEROBIC BOTTLE ONLY GRAM NEGATIVE RODS CRITICAL VALUE NOTED.  VALUE IS CONSISTENT WITH PREVIOUSLY REPORTED AND CALLED VALUE.    Culture (A)  Final    ESCHERICHIA COLI SUSCEPTIBILITIES PERFORMED ON PREVIOUS CULTURE WITHIN THE LAST 5 DAYS. Performed at [facility], [place] [address], [place], [place] [address]    Report Status 08/01/2019 FINAL  Final  Culture, blood (routine x 2)     Status: None (Preliminary result)   Collection Time: 07/29/19  8:04 AM   Specimen: BLOOD RIGHT HAND  Result Value Ref Range Status   Specimen Description   Final    BLOOD RIGHT HAND Performed at [facility] [address], [place],  [address]    Special Requests    Final    BOTTLES DRAWN AEROBIC ONLY Blood Culture adequate volume Performed at [facility], [place] [address], [place], [place] [address]    Culture  Setup Time   Final    GRAM NEGATIVE RODS AEROBIC BOTTLE ONLY CRITICAL VALUE NOTED.  VALUE IS CONSISTENT WITH PREVIOUSLY REPORTED AND CALLED VALUE.    Culture   Final    GRAM NEGATIVE RODS IDENTIFICATION TO FOLLOW Performed at [facility], [place] [address], [place], [place] [address]    Report Status PENDING  Incomplete  Culture, Urine     Status: Abnormal   Collection Time: 07/29/19 10:44 AM   Specimen: Urine, Clean Catch  Result Value Ref Range Status   Specimen Description   Final    URINE, CLEAN CATCH Performed at [facility], [place] [address], [place], [place] [address]    Special Requests   Final    NONE Performed at [facility], [place] [address] [place] [address], [place], [place] [address]    Culture (A)  Final    <10,000 COLONIES/mL INSIGNIFICANT GROWTH Performed at [facility] [address], [place], [place] [address]    Report Status 07/30/2019 FINAL  Final     Labs: BNP (last 3 results) No results for input(s): BNP in the last 8760 hours. Basic Metabolic Panel: Recent Labs  Lab 07/28/19 0433 07/29/19 0341 07/29/19 0804 07/30/19 0454 07/31/19 0511 08/01/19 0518  NA 141 139  --  138 140 137  K 3.7 3.4*  --  3.4* 5.2* 4.0  CL 115* 111  --  110 116* 108  CO2 17* 17*  --  17* 16* 18*  GLUCOSE 111* 93  --  92 76 75  BUN 34* 30*  --  30* 31* 27*  CREATININE 1.67* 1.52*  --  1.66* 1.55* 1.29*  CALCIUM 8.2* 8.0*  --  7.7* 8.0* 8.2*  MG  --   --  1.8 2.4 2.4  --   PHOS  --   --  3.3 2.8 2.9  --    Liver Function Tests: Recent Labs  Lab 07/26/19 1632 07/27/19 0547 07/29/19 0341 07/31/19 0511 08/01/19 0518  AST 21 16 17 18 23   ALT 17 17 18 20 20   ALKPHOS 114 99 75 93 93  BILITOT 1.3* 1.1 1.3* 0.7 1.1  PROT 6.3* 5.7* 5.7* 5.7* 5.8*  ALBUMIN 3.5 2.8* 2.7*  2.5* 2.8*   Recent Labs  Lab 07/26/19 1632  LIPASE 15   No results for input(s): AMMONIA in the last 168 hours. CBC: Recent Labs  Lab 07/28/19 0433 07/28/19 0433 07/29/19 0341 07/30/19 0454 07/30/19 1352 07/31/19 0511 08/01/19 0518  WBC 7.3  --  3.4* 6.5  --  9.6 6.9  NEUTROABS 6.4  --   --  5.8  --  8.7* 5.9  HGB 9.2*  --  9.0* 8.3*  --  9.2* 9.1*  HCT 28.2*  --  28.5* 26.7*  --  29.6* 29.0*  MCV 110.6*  --  110.5* 116.1*  --  116.1* 114.2*  PLT 84*   < > 73* 69* 68* 73* 79*   < > = values in this interval not displayed.   Cardiac Enzymes: No results for input(s): CKTOTAL, CKMB, CKMBINDEX, TROPONINI in the last 168 hours. BNP: Invalid input(s): POCBNP CBG: No results for input(s): GLUCAP in the last 168 hours. D-Dimer Recent Labs    07/30/19 1352  DDIMER 7.02*   Hgb A1c No results for input(s): HGBA1C in the last 72 hours. Lipid Profile No results for input(s): CHOL, HDL, LDLCALC, TRIG, CHOLHDL,  LDLDIRECT in the last 72 hours. Thyroid function studies Recent Labs    07/29/19 1509  TSH 1.914   Anemia work up No results for input(s): VITAMINB12, FOLATE, FERRITIN, TIBC, IRON, RETICCTPCT in the last 72 hours. Urinalysis    Component Value Date/Time   COLORURINE YELLOW 07/26/2019 2027   APPEARANCEUR CLEAR 07/26/2019 2027   LABSPEC 1.005 07/26/2019 2027   PHURINE 7.0 07/26/2019 2027   GLUCOSEU NEGATIVE 07/26/2019 2027   HGBUR MODERATE (A) 07/26/2019 2027   BILIRUBINUR NEGATIVE 07/26/2019 2027   KETONESUR NEGATIVE 07/26/2019 2027   PROTEINUR 30 (A) 07/26/2019 2027   NITRITE NEGATIVE 07/26/2019 2027   LEUKOCYTESUR TRACE (A) 07/26/2019 2027   Sepsis Labs Invalid input(s): PROCALCITONIN,  WBC,  LACTICIDVEN Microbiology Recent Results (from the past 240 hour(s))  SARS CORONAVIRUS 2 (TAT 6-24 HRS) Nasopharyngeal Nasopharyngeal Swab     Status: None   Collection Time: 07/26/19  5:03 PM   Specimen: Nasopharyngeal Swab  Result Value Ref Range Status   SARS  Coronavirus 2 NEGATIVE NEGATIVE Final    Comment: (NOTE) SARS-CoV-2 target nucleic acids are NOT DETECTED. The SARS-CoV-2 RNA is generally detectable in upper and lower respiratory specimens during the acute phase of infection. Negative results do not preclude SARS-CoV-2 infection, do not rule out co-infections with other pathogens, and should not be used as the sole basis for treatment or other patient management decisions. Negative results must be combined with clinical observations, patient history, and epidemiological information. The expected result is Negative. Fact Sheet for Patients: SugarRoll.be Fact Sheet for Healthcare Providers: https://www.woods-mathews.com/ This test is not yet approved or cleared by the [place] FDA and  has been authorized for detection and/or diagnosis of SARS-CoV-2 by FDA under an Emergency Use Authorization (EUA). This EUA will remain  in effect (meaning this test can be used) for the duration of the COVID-19 declaration under Section 56 4(b)(1) of the Act, 21 U.S.C. section 360bbb-3(b)(1), unless the authorization is terminated or revoked sooner. Performed at [facility], [place] [address], [place], [place] [address]   Blood Culture (routine x 2)     Status: Abnormal   Collection Time: 07/26/19  5:19 PM   Specimen: BLOOD LEFT HAND  Result Value Ref Range Status   Specimen Description   Final    BLOOD LEFT HAND Performed at [facility] [address], [place], [place] [address]    Special Requests   Final    BOTTLES DRAWN AEROBIC AND ANAEROBIC Blood Culture adequate volume Performed at [facility] [address], [place], [place] [address]    Culture  Setup Time   Final    GRAM NEGATIVE RODS IN BOTH AEROBIC AND ANAEROBIC BOTTLES CRITICAL RESULT CALLED TO, READ BACK BY AND VERIFIED WITH: Shelda Jakes PharmD 15:25 07/27/19 ([email or web address]) Performed at [facility], [place] [address] [place], [place] [address]    Culture (A)  Final    ESCHERICHIA COLI Confirmed Extended Spectrum Beta-Lactamase Producer (ESBL).  In bloodstream infections from ESBL organisms, carbapenems are preferred over piperacillin/tazobactam. They are shown to have a lower risk of mortality.    Report Status 07/29/2019 FINAL  Final   Organism ID, Bacteria ESCHERICHIA COLI  Final      Susceptibility   Escherichia coli - MIC*    AMPICILLIN >=32 RESISTANT Resistant     CEFAZOLIN >=64 RESISTANT Resistant     CEFEPIME 16 RESISTANT Resistant     CEFTAZIDIME RESISTANT Resistant  CEFTRIAXONE >=64 RESISTANT Resistant     CIPROFLOXACIN >=4 RESISTANT Resistant     GENTAMICIN >=16 RESISTANT Resistant     IMIPENEM <=0.25 SENSITIVE Sensitive     TRIMETH/SULFA >=320 RESISTANT Resistant     AMPICILLIN/SULBACTAM >=32 RESISTANT Resistant     PIP/TAZO <=4 SENSITIVE Sensitive     * ESCHERICHIA COLI  Blood Culture ID Panel (Reflexed)     Status: Abnormal   Collection Time: 07/26/19  5:19 PM  Result Value Ref Range Status   Enterococcus species NOT DETECTED NOT DETECTED Final   Listeria monocytogenes NOT DETECTED NOT DETECTED Final   Staphylococcus species NOT DETECTED NOT DETECTED Final   Staphylococcus aureus (BCID) NOT DETECTED NOT DETECTED Final   Streptococcus species NOT DETECTED NOT DETECTED Final   Streptococcus agalactiae NOT DETECTED NOT DETECTED Final   Streptococcus pneumoniae NOT DETECTED NOT DETECTED Final   Streptococcus pyogenes NOT DETECTED NOT DETECTED Final   Acinetobacter baumannii NOT DETECTED NOT DETECTED Final   Enterobacteriaceae species DETECTED (A) NOT DETECTED Final    Comment: Enterobacteriaceae represent a large family of gram-negative bacteria, not a single organism. CRITICAL RESULT CALLED TO, READ BACK BY AND VERIFIED WITH: Shelda Jakes PharmD 15:25 07/27/19 ([email or web address])    Enterobacter cloacae complex NOT DETECTED NOT DETECTED Final   Escherichia coli  DETECTED (A) NOT DETECTED Final    Comment: CRITICAL RESULT CALLED TO, READ BACK BY AND VERIFIED WITH: Shelda Jakes PharmD 15:25 07/27/19 ([email or web address])    Klebsiella oxytoca NOT DETECTED NOT DETECTED Final   Klebsiella pneumoniae NOT DETECTED NOT DETECTED Final   Proteus species NOT DETECTED NOT DETECTED Final   Serratia marcescens NOT DETECTED NOT DETECTED Final   Carbapenem resistance NOT DETECTED NOT DETECTED Final   Haemophilus influenzae NOT DETECTED NOT DETECTED Final   Neisseria meningitidis NOT DETECTED NOT DETECTED Final   Pseudomonas aeruginosa NOT DETECTED NOT DETECTED Final   Candida albicans NOT DETECTED NOT DETECTED Final   Candida glabrata NOT DETECTED NOT DETECTED Final   Candida krusei NOT DETECTED NOT DETECTED Final   Candida parapsilosis NOT DETECTED NOT DETECTED Final   Candida tropicalis NOT DETECTED NOT DETECTED Final    Comment: Performed at [facility], [place] [address] [place], [place] [address]  Blood Culture (routine x 2)     Status: Abnormal   Collection Time: 07/26/19  5:24 PM   Specimen: BLOOD RIGHT FOREARM  Result Value Ref Range Status   Specimen Description   Final    BLOOD RIGHT FOREARM Performed at [facility] [address] [place], [place] [address]    Special Requests   Final    BOTTLES DRAWN AEROBIC AND ANAEROBIC Blood Culture adequate volume Performed at [facility] [address], [place], [place] [address]    Culture  Setup Time   Final    GRAM NEGATIVE RODS IN BOTH AEROBIC AND ANAEROBIC BOTTLES CRITICAL VALUE NOTED.  VALUE IS CONSISTENT WITH PREVIOUSLY REPORTED AND CALLED VALUE.    Culture (A)  Final    ESCHERICHIA COLI SUSCEPTIBILITIES PERFORMED ON PREVIOUS CULTURE WITHIN THE LAST 5 DAYS. Performed at [facility], [place] [address], [place], [place] [address]    Report Status 07/29/2019 FINAL  Final  Culture, blood (routine x 2)     Status: Abnormal   Collection Time: 07/29/19  8:04 AM    Specimen: BLOOD  Result Value Ref Range Status   Specimen Description   Final    BLOOD RIGHT THUMB Performed at  [facility], [place] [address], [place], [place] [address]    Special Requests   Final    BOTTLES DRAWN AEROBIC ONLY Blood Culture adequate volume Performed at [facility] [address] [place], [place] [address]    Culture  Setup Time   Final    AEROBIC BOTTLE ONLY GRAM NEGATIVE RODS CRITICAL VALUE NOTED.  VALUE IS CONSISTENT WITH PREVIOUSLY REPORTED AND CALLED VALUE.    Culture (A)  Final    ESCHERICHIA COLI SUSCEPTIBILITIES PERFORMED ON PREVIOUS CULTURE WITHIN THE LAST 5 DAYS. Performed at [facility], [place] [address], [place], [place] [address]    Report Status 08/01/2019 FINAL  Final  Culture, blood (routine x 2)     Status: None (Preliminary result)   Collection Time: 07/29/19  8:04 AM   Specimen: BLOOD RIGHT HAND  Result Value Ref Range Status   Specimen Description   Final    BLOOD RIGHT HAND Performed at [facility] [address] [place], [place] [address]    Special Requests   Final    BOTTLES DRAWN AEROBIC ONLY Blood Culture adequate volume Performed at [facility] [address], [place], [place] [address]    Culture  Setup Time   Final    GRAM NEGATIVE RODS AEROBIC BOTTLE ONLY CRITICAL VALUE NOTED.  VALUE IS CONSISTENT WITH PREVIOUSLY REPORTED AND CALLED VALUE.    Culture   Final    GRAM NEGATIVE RODS IDENTIFICATION TO FOLLOW Performed at [facility], [place] [address], [place], [place] [address]    Report Status PENDING  Incomplete  Culture, Urine     Status: Abnormal   Collection Time: 07/29/19 10:44 AM   Specimen: Urine, Clean Catch  Result Value Ref Range Status   Specimen Description   Final    URINE, CLEAN CATCH Performed at [facility], [place] [address] [place], [place] [address]    Special Requests   Final     NONE Performed at [facility], [place] [address], [place], [place] [address]    Culture (A)  Final    <10,000 COLONIES/mL INSIGNIFICANT GROWTH Performed at [facility] [address] [place], [place] [address]    Report Status 07/30/2019 FINAL  Final     Time coordinating discharge:  45 minutes  SIGNED:   Barb Merino, MD  [facility] 08/01/2019, 2:25 PM

## 2019-08-02 DIAGNOSIS — R7881 Bacteremia: Secondary | ICD-10-CM | POA: Diagnosis not present

## 2019-08-02 DIAGNOSIS — A419 Sepsis, unspecified organism: Secondary | ICD-10-CM | POA: Diagnosis not present

## 2019-08-02 DIAGNOSIS — J189 Pneumonia, unspecified organism: Secondary | ICD-10-CM | POA: Diagnosis not present

## 2019-08-02 LAB — CULTURE, BLOOD (ROUTINE X 2): Special Requests: ADEQUATE

## 2019-08-03 DIAGNOSIS — A419 Sepsis, unspecified organism: Secondary | ICD-10-CM | POA: Diagnosis not present

## 2019-08-03 DIAGNOSIS — K219 Gastro-esophageal reflux disease without esophagitis: Secondary | ICD-10-CM | POA: Diagnosis not present

## 2019-08-03 DIAGNOSIS — Z87891 Personal history of nicotine dependence: Secondary | ICD-10-CM | POA: Diagnosis not present

## 2019-08-03 DIAGNOSIS — E785 Hyperlipidemia, unspecified: Secondary | ICD-10-CM | POA: Diagnosis not present

## 2019-08-03 DIAGNOSIS — I10 Essential (primary) hypertension: Secondary | ICD-10-CM | POA: Diagnosis not present

## 2019-08-03 DIAGNOSIS — R69 Illness, unspecified: Secondary | ICD-10-CM | POA: Diagnosis not present

## 2019-08-03 DIAGNOSIS — R7881 Bacteremia: Secondary | ICD-10-CM | POA: Diagnosis not present

## 2019-08-03 DIAGNOSIS — J189 Pneumonia, unspecified organism: Secondary | ICD-10-CM | POA: Diagnosis not present

## 2019-08-04 DIAGNOSIS — A419 Sepsis, unspecified organism: Secondary | ICD-10-CM | POA: Diagnosis not present

## 2019-08-04 DIAGNOSIS — I4891 Unspecified atrial fibrillation: Secondary | ICD-10-CM | POA: Diagnosis not present

## 2019-08-04 DIAGNOSIS — A4151 Sepsis due to Escherichia coli [E. coli]: Secondary | ICD-10-CM | POA: Diagnosis not present

## 2019-08-04 DIAGNOSIS — E785 Hyperlipidemia, unspecified: Secondary | ICD-10-CM | POA: Diagnosis not present

## 2019-08-04 DIAGNOSIS — I739 Peripheral vascular disease, unspecified: Secondary | ICD-10-CM | POA: Diagnosis not present

## 2019-08-04 DIAGNOSIS — M1990 Unspecified osteoarthritis, unspecified site: Secondary | ICD-10-CM | POA: Diagnosis not present

## 2019-08-04 DIAGNOSIS — A4159 Other Gram-negative sepsis: Secondary | ICD-10-CM | POA: Diagnosis not present

## 2019-08-04 DIAGNOSIS — D869 Sarcoidosis, unspecified: Secondary | ICD-10-CM | POA: Diagnosis not present

## 2019-08-04 DIAGNOSIS — J1289 Other viral pneumonia: Secondary | ICD-10-CM | POA: Diagnosis not present

## 2019-08-04 DIAGNOSIS — J189 Pneumonia, unspecified organism: Secondary | ICD-10-CM | POA: Diagnosis not present

## 2019-08-04 DIAGNOSIS — R7881 Bacteremia: Secondary | ICD-10-CM | POA: Diagnosis not present

## 2019-08-04 DIAGNOSIS — I129 Hypertensive chronic kidney disease with stage 1 through stage 4 chronic kidney disease, or unspecified chronic kidney disease: Secondary | ICD-10-CM | POA: Diagnosis not present

## 2019-08-04 DIAGNOSIS — N1831 Chronic kidney disease, stage 3a: Secondary | ICD-10-CM | POA: Diagnosis not present

## 2019-08-04 DIAGNOSIS — D696 Thrombocytopenia, unspecified: Secondary | ICD-10-CM | POA: Diagnosis not present

## 2019-08-05 DIAGNOSIS — R7881 Bacteremia: Secondary | ICD-10-CM | POA: Diagnosis not present

## 2019-08-05 DIAGNOSIS — J1289 Other viral pneumonia: Secondary | ICD-10-CM | POA: Diagnosis not present

## 2019-08-05 DIAGNOSIS — A419 Sepsis, unspecified organism: Secondary | ICD-10-CM | POA: Diagnosis not present

## 2019-08-05 DIAGNOSIS — J189 Pneumonia, unspecified organism: Secondary | ICD-10-CM | POA: Diagnosis not present

## 2019-08-06 DIAGNOSIS — R7881 Bacteremia: Secondary | ICD-10-CM | POA: Diagnosis not present

## 2019-08-06 DIAGNOSIS — A419 Sepsis, unspecified organism: Secondary | ICD-10-CM | POA: Diagnosis not present

## 2019-08-06 DIAGNOSIS — J189 Pneumonia, unspecified organism: Secondary | ICD-10-CM | POA: Diagnosis not present

## 2019-08-07 DIAGNOSIS — J189 Pneumonia, unspecified organism: Secondary | ICD-10-CM | POA: Diagnosis not present

## 2019-08-07 DIAGNOSIS — R7881 Bacteremia: Secondary | ICD-10-CM | POA: Diagnosis not present

## 2019-08-07 DIAGNOSIS — A419 Sepsis, unspecified organism: Secondary | ICD-10-CM | POA: Diagnosis not present

## 2019-08-08 DIAGNOSIS — J189 Pneumonia, unspecified organism: Secondary | ICD-10-CM | POA: Diagnosis not present

## 2019-08-08 DIAGNOSIS — A419 Sepsis, unspecified organism: Secondary | ICD-10-CM | POA: Diagnosis not present

## 2019-08-08 DIAGNOSIS — R7881 Bacteremia: Secondary | ICD-10-CM | POA: Diagnosis not present

## 2019-08-09 DIAGNOSIS — A419 Sepsis, unspecified organism: Secondary | ICD-10-CM | POA: Diagnosis not present

## 2019-08-09 DIAGNOSIS — R7881 Bacteremia: Secondary | ICD-10-CM | POA: Diagnosis not present

## 2019-08-09 DIAGNOSIS — J189 Pneumonia, unspecified organism: Secondary | ICD-10-CM | POA: Diagnosis not present

## 2019-08-10 DIAGNOSIS — R7881 Bacteremia: Secondary | ICD-10-CM | POA: Diagnosis not present

## 2019-08-10 DIAGNOSIS — A419 Sepsis, unspecified organism: Secondary | ICD-10-CM | POA: Diagnosis not present

## 2019-08-10 DIAGNOSIS — J189 Pneumonia, unspecified organism: Secondary | ICD-10-CM | POA: Diagnosis not present

## 2019-08-11 DIAGNOSIS — A419 Sepsis, unspecified organism: Secondary | ICD-10-CM | POA: Diagnosis not present

## 2019-08-11 DIAGNOSIS — J189 Pneumonia, unspecified organism: Secondary | ICD-10-CM | POA: Diagnosis not present

## 2019-08-11 DIAGNOSIS — R7881 Bacteremia: Secondary | ICD-10-CM | POA: Diagnosis not present

## 2019-08-12 DIAGNOSIS — R7881 Bacteremia: Secondary | ICD-10-CM | POA: Diagnosis not present

## 2019-08-12 DIAGNOSIS — D869 Sarcoidosis, unspecified: Secondary | ICD-10-CM | POA: Diagnosis not present

## 2019-08-12 DIAGNOSIS — A419 Sepsis, unspecified organism: Secondary | ICD-10-CM | POA: Diagnosis not present

## 2019-08-12 DIAGNOSIS — A4159 Other Gram-negative sepsis: Secondary | ICD-10-CM | POA: Diagnosis not present

## 2019-08-12 DIAGNOSIS — I129 Hypertensive chronic kidney disease with stage 1 through stage 4 chronic kidney disease, or unspecified chronic kidney disease: Secondary | ICD-10-CM | POA: Diagnosis not present

## 2019-08-12 DIAGNOSIS — M1990 Unspecified osteoarthritis, unspecified site: Secondary | ICD-10-CM | POA: Diagnosis not present

## 2019-08-12 DIAGNOSIS — A4151 Sepsis due to Escherichia coli [E. coli]: Secondary | ICD-10-CM | POA: Diagnosis not present

## 2019-08-12 DIAGNOSIS — I4891 Unspecified atrial fibrillation: Secondary | ICD-10-CM | POA: Diagnosis not present

## 2019-08-12 DIAGNOSIS — J189 Pneumonia, unspecified organism: Secondary | ICD-10-CM | POA: Diagnosis not present

## 2019-08-12 DIAGNOSIS — N1831 Chronic kidney disease, stage 3a: Secondary | ICD-10-CM | POA: Diagnosis not present

## 2019-08-12 DIAGNOSIS — D696 Thrombocytopenia, unspecified: Secondary | ICD-10-CM | POA: Diagnosis not present

## 2019-08-12 DIAGNOSIS — I739 Peripheral vascular disease, unspecified: Secondary | ICD-10-CM | POA: Diagnosis not present

## 2019-08-17 DIAGNOSIS — I129 Hypertensive chronic kidney disease with stage 1 through stage 4 chronic kidney disease, or unspecified chronic kidney disease: Secondary | ICD-10-CM | POA: Diagnosis not present

## 2019-08-17 DIAGNOSIS — M1990 Unspecified osteoarthritis, unspecified site: Secondary | ICD-10-CM | POA: Diagnosis not present

## 2019-08-17 DIAGNOSIS — N1831 Chronic kidney disease, stage 3a: Secondary | ICD-10-CM | POA: Diagnosis not present

## 2019-08-17 DIAGNOSIS — A4151 Sepsis due to Escherichia coli [E. coli]: Secondary | ICD-10-CM | POA: Diagnosis not present

## 2019-08-17 DIAGNOSIS — A4159 Other Gram-negative sepsis: Secondary | ICD-10-CM | POA: Diagnosis not present

## 2019-08-17 DIAGNOSIS — I4891 Unspecified atrial fibrillation: Secondary | ICD-10-CM | POA: Diagnosis not present

## 2019-08-17 DIAGNOSIS — I739 Peripheral vascular disease, unspecified: Secondary | ICD-10-CM | POA: Diagnosis not present

## 2019-08-17 DIAGNOSIS — D696 Thrombocytopenia, unspecified: Secondary | ICD-10-CM | POA: Diagnosis not present

## 2019-08-17 DIAGNOSIS — D869 Sarcoidosis, unspecified: Secondary | ICD-10-CM | POA: Diagnosis not present

## 2019-08-17 DIAGNOSIS — J189 Pneumonia, unspecified organism: Secondary | ICD-10-CM | POA: Diagnosis not present

## 2019-08-18 ENCOUNTER — Other Ambulatory Visit: Payer: Self-pay

## 2019-08-18 ENCOUNTER — Ambulatory Visit: Payer: Medicare HMO | Admitting: *Deleted

## 2019-08-18 ENCOUNTER — Telehealth: Payer: Self-pay | Admitting: *Deleted

## 2019-08-18 DIAGNOSIS — R7881 Bacteremia: Secondary | ICD-10-CM

## 2019-08-18 NOTE — Progress Notes (Signed)
Per verbal order from Dr Johnnye Sima, 35 cm Single Lumen Peripherally Inserted Central Catheter removed from right basilic, tip intact. No sutures present. RN confirmed length per chart. Dressing was clean and dry. Petroleum dressing applied. Pt advised no heavy lifting with this arm, leave dressing for 24 hours and call the office or seek emergent care if dressing becomes soaked with blood or sharp pain presents. Patient verbalized understanding and agreement.  Patient's questions answered to their satisfaction. Patient tolerated procedure well, RN walked patient to check out. Landis Gandy, RN

## 2019-08-18 NOTE — Telephone Encounter (Signed)
Patient walked in, states she was told by her home health nurse to come here to have her PICC removed. Her IV antibiotic ended 2/3. Her last PICC dressing was 2/2. She states that her nursing agency did not tell her how to take care of her PICC after her antibiotic ended, that they didn't have orders for anything else. She did not know to flush her line daily, last did so "once sometime last week."  She was told that there was "no nurse available" over the weekend, no one was available Monday for dressing change.  She was told that "someone would be there Tuesday" but had heard nothing by 3:30. She decided to come to [facility] herself. RN spoke with Dr Johnnye Sima, obtained verbal order to pull PICC.  35 cm Single Lumen Peripherally Inserted Central Catheter removed from right basilic, tip intact. No sutures present. RN confirmed length per chart. Dressing was clean and dry. Petroleum dressing applied. Pt advised no heavy lifting with this arm, leave dressing for 24 hours and call the office or seek emergent care if dressing becomes soaked with blood or sharp pain presents. Patient verbalized understanding and agreement.  Patient's questions answered to their satisfaction. Patient tolerated procedure well, RN walked patient to check out, confirming upcoming appointments. RN notified [facility] of the incident, asked Pam to follow up with patient's nursing agency. Landis Gandy, RN

## 2019-08-19 ENCOUNTER — Encounter: Payer: Self-pay | Admitting: Internal Medicine

## 2019-08-19 ENCOUNTER — Ambulatory Visit (INDEPENDENT_AMBULATORY_CARE_PROVIDER_SITE_OTHER): Payer: Medicare HMO | Admitting: Internal Medicine

## 2019-08-19 VITALS — BP 128/83 | HR 88 | Wt 162.0 lb

## 2019-08-19 DIAGNOSIS — R7881 Bacteremia: Secondary | ICD-10-CM | POA: Diagnosis not present

## 2019-08-19 NOTE — Progress Notes (Signed)
HEMATOLOGY/ONCOLOGY CLINIC NOTE  Date of Service: 08/20/19    Patient Care Team: Lawerance Cruel, MD as PCP - General (Family Medicine) [name], MD as PCP - Cardiology (Cardiology)  CHIEF COMPLAINTS/PURPOSE OF CONSULTATION:  F/u for mx of Jak2 MPN  HISTORY OF PRESENTING ILLNESS:   [name] is a wonderful 74 y.o. 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 The pt reports that she is doing well overall. She has seen dermatology and rheumatology with work up of her sarcoidosis. She is seeing Dr Christinia Gully in one month in Pulmonology.   She notes that her skin changes first appeared 10 years ago.   The pt reports that her recent nose bx with [facility] a week ago revealed sarcoidosis and her right foot bx revealed stasis dermatitis. She has been diagnosed with sarcoidosis before this. She began a 20mg  prednisone 73-month-taper, three weeks ago with Dr. [name], and is also taking Plaquenil for her sarcoidosis. She had a CT on 09/19/17 to properly work up her sarcoidosis diagnosis.   She notes that prior to taking steroids her feet were very painful and also had some neuropathy. She also had night sweats and leg and ankle swelling but did not have fatigue or shortness of breath. These positive symptoms have resolved after beginning Prednisone.   She denies any breast symptoms. She has a MMG on 08/05/2017 which showed : IMPRESSION: New suspicious left breast mass 7:00 position retroareolar measuring 0.6 cm on ultrasound. Malignancy cannot be excluded. The mass does have similar imaging appearance to the previously biopsied left breast mass (January 2017, which demonstrated granulomatous inflammation and no evidence of malignancy at pathology.) The possibility of a second area of granulomatous inflammation is considered.  Borderline diffuse cortical thickening of a left axillary  lymph node. On review of the patient's prior CT a of the chest, patient does have mild diffuse lymphadenopathy in the thorax. At this time, axillary lymph node biopsy is not felt warranted.  The patient was given a copy of her pathology report from her left breast biopsy from January 2017. I told her she may want to share this information with her rheumatologist.  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   Of note prior to the patient's visit today, pt has had CT Chest completed on 09/19/17 with results revealing 1. Moderate mediastinal and right supraclavicular lymphadenopathy, increased. New mild bilateral axillary and bilateral hilar lymphadenopathy. Findings are worrisome for a malignant process such as a lymphoproliferative disorder or metastatic nodal disease. The right supraclavicular node may be (but not definitely) accessible for percutaneous biopsy. 2. Numerous similar-appearing subsolid spiculated pulmonary nodules scattered throughout both lungs, upper lobe predominant, largest 1.6 cm, stable to mildly increased in size and slightly increased in density in the interval. These are indeterminate, with multifocal lung adenocarcinoma on the differential. 3. Several new subcentimeter hypodense splenic lesions. Solitary small hypodense posterior right liver lobe lesion. These lesions are indeterminate, with lymphoproliferative disorder or metastatic disease not excluded. Options include further characterization with MRI abdomen without and with IV contrast or short-term follow-up CT abdomen with IV contrast in 3 months. 4. Chronic findings include: Left main and 3 vessel coronary atherosclerosis. Small hiatal hernia. Cholelithiasis.   Most recent lab results (03/10/17) of CBC  is as follows: all values are WNL except for WBC at 17.5k, MCV at 75.8, MCH at 25.2, RDW at 17.6,  Platelets at 804k.  On review of systems, pt reports dry mouth, decreased leg swelling, and  denies tingling, numbness in her hands or feet, dry eyes, problems breathing, cough, SOB, CP, nose bleeds, fatigue, abdominal pains, unexpected weight loss, fevers, chills, night sweats.   On PMHx the pt reports sarcoidosis, Bell's palsy twice.  On Social Hx the pt reports that she smoked cigarettes for 45 years. She quit smoking cigarettes 10 years ago but continues to smoke marijuana occasionally.  On Family Hx the pt reports maternal lupus.  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 The patient's last visit with [place] was on 05/19/2019. The pt reports that she is doing well overall.  The pt reports she is feeling better since being in the hospital for pneumonia on 07/26/2019.   Lab results today (08/20/19) of CBC w/diff and CMP is as follows: all values are WNL except for RBC at 2.92, Hemoglobin at 9.3, HCT at 29.1, Abs Immature Granulocytes at 0.10, Creatinine at 1.33, Calcium at 8.1, Albumin at 3.3, AST at 13, Alkaline Phosphatase at 140, GFR Est Non Af Am at 40, GFR Est AFR Am at 46, PENDING LDH.  On review of systems, pt reports decrease in energy and denies an new concerns, urinary symptoms, diarrhea, coughing, abdominal pain, leg swelling and any other symptoms.   MEDICAL HISTORY:  Past Medical History:  Diagnosis Date  . Arthritis   . Hyperlipidemia   . Sarcoidosis     SURGICAL HISTORY: Past Surgical History:  Procedure Laterality Date  . ABDOMINAL HYSTERECTOMY    . BREAST BIOPSY    . BREAST SURGERY    . REDUCTION MAMMAPLASTY      SOCIAL HISTORY: Social History   Socioeconomic History  . Marital status: Widowed    Spouse name: Not on file  . Number of children: Not on file  . Years of education: Not on file  . Highest education level: Not on file  Occupational History  . Not on file  Tobacco Use  . Smoking status: Former Smoker    Packs/day: 1.00    Years: 45.00    Pack years: 45.00    Types: Cigarettes     Quit date: 10/15/2007    Years since quitting: 11.8  . Smokeless tobacco: Never Used  Substance and Sexual Activity  . Alcohol use: Yes  . Drug use: No  . Sexual activity: Not on file  Other Topics Concern  . Not on file  Social History Narrative  . Not on file   Social Determinants of Health   Financial Resource Strain:   . Difficulty of Paying Living Expenses: Not on file  Food Insecurity:   . Worried About Charity fundraiser in the Last Year: Not on file  . Ran Out of Food in the Last Year: Not on file  Transportation Needs:   . Lack of Transportation (Medical): Not on file  . Lack of Transportation (Non-Medical): Not on file  Physical Activity:   . Days of Exercise per Week: Not on file  . Minutes of Exercise per Session: Not on file  Stress:   . Feeling of Stress : Not on file  Social Connections:   . Frequency of Communication with Friends and Family: Not on file  . Frequency of Social Gatherings with Friends and Family: Not on file  . Attends Religious Services: Not on file  . Active Member of Clubs or Organizations: Not on file  .  Attends Archivist Meetings: Not on file  . Marital Status: Not on file  Intimate Partner Violence:   . Fear of Current or Ex-Partner: Not on file  . Emotionally Abused: Not on file  . Physically Abused: Not on file  . Sexually Abused: Not on file    FAMILY HISTORY: Family History  Problem Relation Age of Onset  . Heart disease Father   . Heart disease Brother     ALLERGIES:  is allergic to penicillins and codeine.  MEDICATIONS:  Current Outpatient Medications  Medication Sig Dispense Refill  . acetaminophen (TYLENOL) 325 MG tablet Take 1 tablet (325 mg total) by mouth every 6 (six) hours. (Patient taking differently: Take 325 mg by mouth every 6 (six) hours as needed for headache. ) 30 tablet 0  . amLODipine (NORVASC) 5 MG tablet Take 5 mg by mouth every evening.     Marland Kitchen aspirin EC 81 MG tablet Take 81 mg by mouth  daily.    Marland Kitchen atorvastatin (LIPITOR) 40 MG tablet Take 40 mg by mouth every evening.     . folic acid (FOLVITE) 1 MG tablet Take 1 mg by mouth daily.    . Melatonin 5 MG TABS Take 5 mg by mouth at bedtime.    . metoprolol tartrate (LOPRESSOR) 25 MG tablet Take 1 tablet (25 mg total) by mouth 2 (two) times daily. 60 tablet 0  . omeprazole (PRILOSEC) 40 MG capsule Take 40 mg by mouth every evening.     . predniSONE (DELTASONE) 5 MG tablet Take 5 mg by mouth daily.    Marland Kitchen venlafaxine XR (EFFEXOR-XR) 150 MG 24 hr capsule Take 150 mg by mouth every evening.      No current facility-administered medications for this visit.    REVIEW OF SYSTEMS:   A 10+ POINT REVIEW OF SYSTEMS WAS OBTAINED including neurology, dermatology, psychiatry, cardiac, respiratory, lymph, extremities, GI, GU, Musculoskeletal, constitutional, breasts, reproductive, HEENT.  All pertinent positives are noted in the HPI.  All others are negative.    PHYSICAL EXAMINATION: ECOG FS:2 - Symptomatic, <50% confined to bed  Vitals:   08/20/19 1502  BP: (!) 140/97  Pulse: (!) 102  Resp: 20  Temp: 98.2 F (36.8 C)  SpO2: 99%   Wt Readings from Last 3 Encounters:  08/20/19 159 lb 14.4 oz (72.5 kg)  08/19/19 162 lb (73.5 kg)  07/27/19 169 lb 9.6 oz (76.9 kg)   Body mass index is 30.21 kg/m.    GENERAL:alert, in no acute distress and comfortable SKIN: no acute rashes, no significant lesions EYES: conjunctiva are pink and non-injected, sclera anicteric OROPHARYNX: MMM, no exudates, no oropharyngeal erythema or ulceration NECK: supple, no JVD LYMPH:  no palpable lymphadenopathy in the cervical, axillary or inguinal regions LUNGS: clear to auscultation b/l with normal respiratory effort HEART: regular rate & rhythm ABDOMEN:  normoactive bowel sounds , non tender, not distended. Extremity: no pedal edema PSYCH: alert & oriented x 3 with fluent speech NEURO: no focal motor/sensory deficits   LABORATORY DATA:  I have  reviewed the data as listed  . CBC Latest Ref Rng & Units 08/20/2019 08/01/2019 07/31/2019  WBC 4.0 - 10.5 K/uL 8.7 6.9 9.6  Hemoglobin 12.0 - 15.0 g/dL 9.3(L) 9.1(L) 9.2(L)  Hematocrit 36.0 - 46.0 % 29.1(L) 29.0(L) 29.6(L)  Platelets 150 - 400 K/uL 323 79(L) 73(L)    . CMP Latest Ref Rng & Units 08/20/2019 08/01/2019 07/31/2019  Glucose 70 - 99 mg/dL 94 75 76  BUN  8 - 23 mg/dL 12 27(H) 31(H)  Creatinine 0.44 - 1.00 mg/dL 1.33(H) 1.29(H) 1.55(H)  Sodium 135 - 145 mmol/L 142 137 140  Potassium 3.5 - 5.1 mmol/L 3.5 4.0 5.2(H)  Chloride 98 - 111 mmol/L 109 108 116(H)  CO2 22 - 32 mmol/L 23 18(L) 16(L)  Calcium 8.9 - 10.3 mg/dL 8.1(L) 8.2(L) 8.0(L)  Total Protein 6.5 - 8.1 g/dL 6.7 5.8(L) 5.7(L)  Total Bilirubin 0.3 - 1.2 mg/dL 0.6 1.1 0.7  Alkaline Phos 38 - 126 U/L 140(H) 93 93  AST 15 - 41 U/L 13(L) 23 18  ALT 0 - 44 U/L 9 20 20            RADIOGRAPHIC STUDIES: I have personally reviewed the radiological images as listed and agreed with the findings in the report. DG Chest Port 1 View  Result Date: 07/26/2019 CLINICAL DATA:  Shortness of breath, vomiting EXAM: PORTABLE CHEST 1 VIEW COMPARISON:  Radiograph 10/29/2017, CT 07/17/2018 FINDINGS: Slight left anterior obliquity accentuating the cardiomediastinal silhouette and superimposing the mediastinum over the right lung. Coarsened interstitial changes are similar to comparison is. Some new reticulonodular opacity seen in the right mid lung. No pneumothorax. No effusion. The aorta is calcified. The remaining cardiomediastinal contours are unremarkable accounting for technique and rotation. No acute osseous or soft tissue abnormality. Degenerative changes are present in the imaged spine and shoulders. IMPRESSION: 1. New reticulonodular opacity seen in the right mid lung which may represent pneumonia. Recommend follow-up imaging to resolution. 2. Stable chronic interstitial changes. 3.  Aortic Atherosclerosis (ICD10-I70.0). Electronically  Signed   By: Lovena Le M.D.   On: 07/26/2019 17:23   ECHOCARDIOGRAM COMPLETE  Result Date: 07/30/2019   ECHOCARDIOGRAM REPORT   Patient Name:   ADIA CRAMMER Date of Exam: 07/30/2019 Medical Rec #:  [id number]       Height:       61.0 in Accession #:    [id number]      Weight:       169.6 lb Date of Birth:  05-Oct-1945       BSA:          1.76 m Patient Age:    61 years        BP:           119/92 mmHg Patient Gender: F               HR:           132 bpm. Exam Location:  Inpatient Procedure: 2D Echo, Cardiac Doppler and Color Doppler Indications:    Abnormal EKG  History:        Patient has no prior history of Echocardiogram examinations.                 Arrythmias:Atrial Fibrillation; Risk Factors:Hypertension and                 Dyslipidemia. Sepsis.  Sonographer:    Dustin Flock Referring Phys: [id number] [facility]  Sonographer Comments: Suboptimal parasternal window. Image acquisition challenging due to patient body habitus. IMPRESSIONS  1. Left ventricular ejection fraction, by visual estimation, is 55 to 60%. The left ventricle has normal function. Moderately increased left ventricular posterior wall thickness. There is no left ventricular hypertrophy.  2. Left ventricular diastolic parameters are indeterminate.  3. The left ventricle has no regional wall motion abnormalities.  4. Global right ventricle has normal systolic function.The right ventricular size is normal. No increase in right ventricular wall thickness.  5. Left atrial size was mildly dilated.  6. Right atrial size was normal.  7. The mitral valve is normal in structure. Mild mitral valve regurgitation.  8. The tricuspid valve is normal in structure. Tricuspid valve regurgitation is not demonstrated.  9. The aortic valve is tricuspid. Aortic valve regurgitation is mild. No evidence of aortic valve sclerosis or stenosis 10. TR signal is inadequate for assessing pulmonary artery systolic pressure. 11. The inferior vena cava is normal  in size with <50% respiratory variability, suggesting right atrial pressure of 8 mmHg. 12. The patient was in atrial fibrillation. 13. Technically difficult study with poor acoustic windows. FINDINGS  Left Ventricle: Left ventricular ejection fraction, by visual estimation, is 55 to 60%. The left ventricle has normal function. The left ventricle has no regional wall motion abnormalities. The left ventricular internal cavity size was the left ventricle is normal in size. Moderately increased left ventricular posterior wall thickness. There is no left ventricular hypertrophy. Left ventricular diastolic parameters are indeterminate. Right Ventricle: The right ventricular size is normal. No increase in right ventricular wall thickness. Global RV systolic function is has normal systolic function. Left Atrium: Left atrial size was mildly dilated. Right Atrium: Right atrial size was normal in size Pericardium: There is no evidence of pericardial effusion. Mitral Valve: The mitral valve is normal in structure. Mild mitral valve regurgitation. Tricuspid Valve: The tricuspid valve is normal in structure. Tricuspid valve regurgitation is not demonstrated. Aortic Valve: The aortic valve is tricuspid. Aortic valve regurgitation is mild. Aortic regurgitation PHT measures 664 msec. The aortic valve is structurally normal, with no evidence of sclerosis or stenosis. Pulmonic Valve: The pulmonic valve was normal in structure. Pulmonic valve regurgitation is not visualized. Pulmonic regurgitation is not visualized. Aorta: The aortic root is normal in size and structure. Venous: The inferior vena cava is normal in size with less than 50% respiratory variability, suggesting right atrial pressure of 8 mmHg. IAS/Shunts: No atrial level shunt detected by color flow Doppler.  LEFT VENTRICLE PLAX 2D LVIDd:         4.40 cm  Diastology LVIDs:         3.10 cm  LV e' lateral:   4.68 cm/s LV PW:         1.10 cm  LV E/e' lateral: 29.9 LV IVS:         0.90 cm  LV e' medial:    6.85 cm/s LVOT diam:     1.50 cm  LV E/e' medial:  20.4 LV SV:         50 ml LV SV Index:   26.89 LVOT Area:     1.77 cm  RIGHT VENTRICLE RV Basal diam:  2.10 cm RV S prime:     5.22 cm/s TAPSE (M-mode): 2.4 cm LEFT ATRIUM             Index       RIGHT ATRIUM           Index LA diam:        3.80 cm 2.16 cm/m  RA Area:     14.70 cm LA Vol (A2C):   46.6 ml 26.46 ml/m RA Volume:   35.40 ml  20.10 ml/m LA Vol (A4C):   52.0 ml 29.53 ml/m LA Biplane Vol: 50.5 ml 28.68 ml/m  AORTIC VALVE LVOT Vmax:   99.80 cm/s LVOT Vmean:  56.500 cm/s LVOT VTI:    0.158 m AI PHT:      664 msec  AORTA Ao Root diam: 2.50 cm MITRAL VALVE MV Area (PHT): 7.59 cm             SHUNTS MV PHT:        29.00 msec           Systemic VTI:  0.16 m MV Decel Time: 100 msec             Systemic Diam: 1.50 cm MV E velocity: 140.00 cm/s 103 cm/s  Loralie Champagne MD Electronically signed by Loralie Champagne MD Signature Date/Time: 07/30/2019/4:34:41 PM    Final    CT Renal Stone Study  Result Date: 07/26/2019 CLINICAL DATA:  74 year old female with abdominal discomfort, nausea vomiting. Flank pain. EXAM: CT ABDOMEN AND PELVIS WITHOUT CONTRAST TECHNIQUE: Multidetector CT imaging of the abdomen and pelvis was performed following the standard protocol without IV contrast. COMPARISON:  CT abdomen pelvis dated 07/17/2018. FINDINGS: Evaluation of this exam is limited in the absence of intravenous contrast. Lower chest: The visualized lung bases are clear. Multi vessel coronary vascular calcification noted. No intra-abdominal free air or free fluid. Hepatobiliary: Mild fatty infiltration of the liver. No intrahepatic biliary ductal dilatation. Several stones noted within the gallbladder. No pericholecystic fluid or evidence of acute cholecystitis by CT. Pancreas: Unremarkable. No pancreatic ductal dilatation or surrounding inflammatory changes. Spleen: Normal in size without focal abnormality. Adrenals/Urinary Tract: A 17 mm left  adrenal nodule, likely an adenoma. The right adrenal gland is unremarkable. There is no hydronephrosis or nephrolithiasis on either side. A 2 cm right renal inferior pole hypodense lesion is not characterized on this noncontrast CT but appears similar to prior CT. Mild bilateral perinephric stranding, nonspecific. Correlation with urinalysis recommended to evaluate for UTI. The urinary bladder is grossly unremarkable. Stomach/Bowel: There is a small hiatal hernia. Scattered colonic diverticula without active inflammatory changes. There is no bowel obstruction or active inflammation. The appendix is normal. Vascular/Lymphatic: Advanced aortoiliac atherosclerotic disease. The IVC is unremarkable. No portal venous gas. There is no adenopathy. Reproductive: Hysterectomy.  No adnexal masses. Other: None Musculoskeletal: Osteopenia with multilevel degenerative changes of the spine with disc desiccation and vacuum phenomena. No acute osseous pathology. IMPRESSION: 1. No hydronephrosis or nephrolithiasis. Correlation with urinalysis recommended to evaluate for UTI. 2. Cholelithiasis. 3. No bowel obstruction or active inflammation.  Normal appendix. 4. Scattered colonic diverticula without active inflammatory changes. 5. Coronary vascular calcification and Aortic Atherosclerosis (ICD10-I70.0). Electronically Signed   By: Anner Crete M.D.   On: 07/26/2019 19:08   [place] EKG SITE RITE  Result Date: 07/31/2019 If Site Rite image not attached, placement could not be confirmed due to current cardiac rhythm.  CT chest 09/19/2017: IMPRESSION: 1. Moderate mediastinal and right supraclavicular lymphadenopathy, increased. New mild bilateral axillary and bilateral hilar lymphadenopathy. Findings are worrisome for a malignant process such as a lymphoproliferative disorder or metastatic nodal disease. The right supraclavicular node may be (but not definitely) accessible for percutaneous biopsy. 2. Numerous similar-appearing  subsolid spiculated pulmonary nodules scattered throughout both lungs, upper lobe predominant, largest 1.6 cm, stable to mildly increased in size and slightly increased in density in the interval. These are indeterminate, with multifocal lung adenocarcinoma on the differential. 3. Several new subcentimeter hypodense splenic lesions. Solitary small hypodense posterior right liver lobe lesion. These lesions are indeterminate, with lymphoproliferative disorder or metastatic disease not excluded. Options include further characterization with MRI abdomen without and with IV contrast or short-term follow-up CT abdomen with IV contrast in 3 months. 4. Chronic findings include: Left main  and 3 vessel coronary atherosclerosis. Small hiatal hernia. Cholelithiasis.  Aortic Atherosclerosis (ICD10-I70.0).  Electronically Signed   By: Ilona Sorrel M.D.   On: 09/19/2017 11:54   ASSESSMENT & PLAN:   74 y.o. female with  1. Generalized lymphadenopathy with multiple pulmonary nodules CT Chest 09/19/2017: Moderate mediastinal and right supraclavicular lymphadenopathy, increased. New mild bilateral axillary and bilateral hilar lymphadenopathy. Findings are worrisome for a malignant process such as a lymphoproliferative disorder or metastatic nodal disease. The right supraclavicular node may be (but not definitely) accessible for percutaneous biopsy. 2. Numerous similar-appearing subsolid spiculated pulmonary nodules scattered throughout both lungs, upper lobe predominant, largest 1.6 cm, stable to mildly increased in size and slightly increased in density in the interval. These are indeterminate, with multifocal lung adenocarcinoma on the differential.  12/26/17 CT C/A/P revealed interval response to steroids in LNadenopathy   07/17/18 CT C/A/P revealed Continue improvement in mediastinal adenopathy. Lymph nodes are now not pathologic by size criteria. 2. Continued improvement in nodular and ground-glass  densities particularly in the RIGHT upper lobe. 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  2. Breast - lesion - Bx -- granulomatous inflammation -consistent with sarcoidosis.  3. Skin rash on nose - Bx consistent with sarcoidosis.  4. Iron deficiency - no significant anemia . Lab Results  Component Value Date   IRON 123 11/03/2018   TIBC 354 11/03/2018   IRONPCTSAT 35 11/03/2018   (Iron and TIBC)  Lab Results  Component Value Date   FERRITIN 97 07/26/2019   PLAN - GI w/u - will defer to PCP -?GI involvement by sarcoidosis.  5. 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 -Advised discussing when to start methotrexate with Rheumatologist -Will continue to monitor liver functions  -Continue Folic Acid supplements -Continue to follow up with Rheumatology -PLTs at goal, mild anemia  -Discussed COVID vaccine  -Will start Hydroxyurea 500 mg every other day in one week. -Will be adding Activia yogurt to her diet to avoid abx related diarrhea -Will see back in one month  FOLLOW UP: RTC with Dr Irene Limbo with labs in 1 month  The total time spent in the appt was 30 minutes and more than 50% was on counseling and direct patient cares.  All of the patient's questions were answered with apparent satisfaction. The  patient knows to call the clinic with any problems, questions or concerns.  Sullivan Lone MD [place] AAHIVMS [facility] Hematology/Oncology Physician [facility]  (Office):       [phone] (Work cell):  [phone] (Fax):           [phone]  08/19/2019 8:13 PM  I, Scot Dock, am acting as a scribe for Dr. Sullivan Lone.   .I have reviewed the above documentation for accuracy and completeness, and I agree with the above. Brunetta Genera MD

## 2019-08-20 ENCOUNTER — Inpatient Hospital Stay: Payer: Medicare HMO | Admitting: Hematology

## 2019-08-20 ENCOUNTER — Other Ambulatory Visit: Payer: Medicare HMO

## 2019-08-20 ENCOUNTER — Inpatient Hospital Stay: Payer: Medicare HMO | Attending: Hematology

## 2019-08-20 ENCOUNTER — Other Ambulatory Visit: Payer: Self-pay

## 2019-08-20 ENCOUNTER — Ambulatory Visit: Payer: Medicare HMO | Admitting: Hematology

## 2019-08-20 VITALS — BP 140/97 | HR 102 | Temp 98.2°F | Resp 20 | Ht 61.0 in | Wt 159.9 lb

## 2019-08-20 DIAGNOSIS — D473 Essential (hemorrhagic) thrombocythemia: Secondary | ICD-10-CM | POA: Diagnosis not present

## 2019-08-20 DIAGNOSIS — E785 Hyperlipidemia, unspecified: Secondary | ICD-10-CM | POA: Insufficient documentation

## 2019-08-20 DIAGNOSIS — I10 Essential (primary) hypertension: Secondary | ICD-10-CM | POA: Diagnosis not present

## 2019-08-20 DIAGNOSIS — Z79899 Other long term (current) drug therapy: Secondary | ICD-10-CM | POA: Insufficient documentation

## 2019-08-20 DIAGNOSIS — Z7982 Long term (current) use of aspirin: Secondary | ICD-10-CM | POA: Diagnosis not present

## 2019-08-20 DIAGNOSIS — K219 Gastro-esophageal reflux disease without esophagitis: Secondary | ICD-10-CM | POA: Diagnosis not present

## 2019-08-20 DIAGNOSIS — D863 Sarcoidosis of skin: Secondary | ICD-10-CM | POA: Diagnosis not present

## 2019-08-20 LAB — CBC WITH DIFFERENTIAL/PLATELET
Abs Immature Granulocytes: 0.1 10*3/uL — ABNORMAL HIGH (ref 0.00–0.07)
Basophils Absolute: 0.1 10*3/uL (ref 0.0–0.1)
Basophils Relative: 1 %
Eosinophils Absolute: 0.1 10*3/uL (ref 0.0–0.5)
Eosinophils Relative: 1 %
HCT: 29.1 % — ABNORMAL LOW (ref 36.0–46.0)
Hemoglobin: 9.3 g/dL — ABNORMAL LOW (ref 12.0–15.0)
Immature Granulocytes: 1 %
Lymphocytes Relative: 37 %
Lymphs Abs: 3.2 10*3/uL (ref 0.7–4.0)
MCH: 31.8 pg (ref 26.0–34.0)
MCHC: 32 g/dL (ref 30.0–36.0)
MCV: 99.7 fL (ref 80.0–100.0)
Monocytes Absolute: 1.1 10*3/uL — ABNORMAL HIGH (ref 0.1–1.0)
Monocytes Relative: 13 %
Neutro Abs: 4.1 10*3/uL (ref 1.7–7.7)
Neutrophils Relative %: 47 %
Platelets: 323 10*3/uL (ref 150–400)
RBC: 2.92 MIL/uL — ABNORMAL LOW (ref 3.87–5.11)
RDW: 15.3 % (ref 11.5–15.5)
WBC: 8.7 10*3/uL (ref 4.0–10.5)
nRBC: 0 % (ref 0.0–0.2)

## 2019-08-20 LAB — CMP (CANCER CENTER ONLY)
ALT: 9 U/L (ref 0–44)
AST: 13 U/L — ABNORMAL LOW (ref 15–41)
Albumin: 3.3 g/dL — ABNORMAL LOW (ref 3.5–5.0)
Alkaline Phosphatase: 140 U/L — ABNORMAL HIGH (ref 38–126)
Anion gap: 10 (ref 5–15)
BUN: 12 mg/dL (ref 8–23)
CO2: 23 mmol/L (ref 22–32)
Calcium: 8.1 mg/dL — ABNORMAL LOW (ref 8.9–10.3)
Chloride: 109 mmol/L (ref 98–111)
Creatinine: 1.33 mg/dL — ABNORMAL HIGH (ref 0.44–1.00)
GFR, Est AFR Am: 46 mL/min — ABNORMAL LOW (ref >60)
GFR, Estimated: 40 mL/min — ABNORMAL LOW (ref >60)
Glucose, Bld: 94 mg/dL (ref 70–99)
Potassium: 3.5 mmol/L (ref 3.5–5.1)
Sodium: 142 mmol/L (ref 135–145)
Total Bilirubin: 0.6 mg/dL (ref 0.3–1.2)
Total Protein: 6.7 g/dL (ref 6.5–8.1)

## 2019-08-20 LAB — LACTATE DEHYDROGENASE: LDH: 229 U/L — ABNORMAL HIGH (ref 98–192)

## 2019-08-25 ENCOUNTER — Telehealth: Payer: Self-pay | Admitting: Hematology

## 2019-08-25 NOTE — Telephone Encounter (Signed)
Scheduled per 02/11 los, spoke with patient's son and she will be notified.

## 2019-08-26 DIAGNOSIS — D869 Sarcoidosis, unspecified: Secondary | ICD-10-CM | POA: Diagnosis not present

## 2019-08-26 DIAGNOSIS — A4151 Sepsis due to Escherichia coli [E. coli]: Secondary | ICD-10-CM | POA: Diagnosis not present

## 2019-08-26 DIAGNOSIS — J189 Pneumonia, unspecified organism: Secondary | ICD-10-CM | POA: Diagnosis not present

## 2019-08-26 DIAGNOSIS — I739 Peripheral vascular disease, unspecified: Secondary | ICD-10-CM | POA: Diagnosis not present

## 2019-08-26 DIAGNOSIS — M1990 Unspecified osteoarthritis, unspecified site: Secondary | ICD-10-CM | POA: Diagnosis not present

## 2019-08-26 DIAGNOSIS — D696 Thrombocytopenia, unspecified: Secondary | ICD-10-CM | POA: Diagnosis not present

## 2019-08-26 DIAGNOSIS — N1831 Chronic kidney disease, stage 3a: Secondary | ICD-10-CM | POA: Diagnosis not present

## 2019-08-26 DIAGNOSIS — I4891 Unspecified atrial fibrillation: Secondary | ICD-10-CM | POA: Diagnosis not present

## 2019-08-26 DIAGNOSIS — I129 Hypertensive chronic kidney disease with stage 1 through stage 4 chronic kidney disease, or unspecified chronic kidney disease: Secondary | ICD-10-CM | POA: Diagnosis not present

## 2019-08-26 DIAGNOSIS — A4159 Other Gram-negative sepsis: Secondary | ICD-10-CM | POA: Diagnosis not present

## 2019-08-28 DIAGNOSIS — E7800 Pure hypercholesterolemia, unspecified: Secondary | ICD-10-CM | POA: Diagnosis not present

## 2019-08-28 DIAGNOSIS — R69 Illness, unspecified: Secondary | ICD-10-CM | POA: Diagnosis not present

## 2019-08-28 DIAGNOSIS — M1990 Unspecified osteoarthritis, unspecified site: Secondary | ICD-10-CM | POA: Diagnosis not present

## 2019-09-02 NOTE — Progress Notes (Signed)
RFV: follow up for gram negative bacteremia Patient ID: Alexis Murphy, female   DOB: 09-Aug-1945, 74 y.o.   MRN: [id number]  HPI 74yo F with history of lymphoma, recently admitted for esbl ecoli bacteremia thought to be due to urinary source. She finished abtx which 14 day course plus had picc line pulled. She is doing well. Denies diarrhea. Denies any dysuria  Outpatient Encounter Medications as of 08/19/2019  Medication Sig  . acetaminophen (TYLENOL) 325 MG tablet Take 1 tablet (325 mg total) by mouth every 6 (six) hours. (Patient taking differently: Take 325 mg by mouth every 6 (six) hours as needed for headache. )  . amLODipine (NORVASC) 5 MG tablet Take 5 mg by mouth every evening.   Marland Kitchen aspirin EC 81 MG tablet Take 81 mg by mouth daily.  Marland Kitchen atorvastatin (LIPITOR) 40 MG tablet Take 40 mg by mouth every evening.   . folic acid (FOLVITE) 1 MG tablet Take 1 mg by mouth daily.  . Melatonin 5 MG TABS Take 5 mg by mouth at bedtime.  . metoprolol tartrate (LOPRESSOR) 25 MG tablet Take 1 tablet (25 mg total) by mouth 2 (two) times daily.  . predniSONE (DELTASONE) 5 MG tablet Take 5 mg by mouth daily.  Marland Kitchen venlafaxine XR (EFFEXOR-XR) 150 MG 24 hr capsule Take 150 mg by mouth every evening.   Marland Kitchen omeprazole (PRILOSEC) 40 MG capsule Take 40 mg by mouth every evening.    No facility-administered encounter medications on file as of 08/19/2019.     Patient Active Problem List   Diagnosis Date Noted  . Bacteremia due to Gram-negative bacteria 07/29/2019  . New onset a-fib ([place]) 07/29/2019  . Hypokalemia 07/27/2019  . AKI (acute kidney injury) ([place]) 07/27/2019  . Sepsis ([place]) 07/27/2019  . Pneumonia 07/26/2019  . 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 PAD (peripheral artery disease) ([place]) 05/26/2018  . Sarcoidosis 10/29/2017     Health Maintenance Due  Topic Date Due  . Hepatitis C Screening  1946-06-29  . TETANUS/TDAP  08/23/1965  . COLONOSCOPY  08/23/1996  . PNA vac Low Risk Adult (1 of  2 - PCV13) 08/24/2011    Social History   Tobacco Use  . Smoking status: Former Smoker    Packs/day: 1.00    Years: 45.00    Pack years: 45.00    Types: Cigarettes    Quit date: 10/15/2007    Years since quitting: 11.8  . Smokeless tobacco: Never Used  Substance Use Topics  . Alcohol use: Yes  . Drug use: No   Review of Systems 12 point ros is negative Physical Exam   BP 128/83   Pulse 88   Wt 162 lb (73.5 kg)   BMI 30.61 kg/m   No exam CBC Lab Results  Component Value Date   WBC 8.7 08/20/2019   RBC 2.92 (L) 08/20/2019   HGB 9.3 (L) 08/20/2019   HCT 29.1 (L) 08/20/2019   PLT 323 08/20/2019   MCV 99.7 08/20/2019   MCH 31.8 08/20/2019   MCHC 32.0 08/20/2019   RDW 15.3 08/20/2019   LYMPHSABS 3.2 08/20/2019   MONOABS 1.1 (H) 08/20/2019   EOSABS 0.1 08/20/2019    BMET Lab Results  Component Value Date   NA 142 08/20/2019   K 3.5 08/20/2019   CL 109 08/20/2019   CO2 23 08/20/2019   GLUCOSE 94 08/20/2019   BUN 12 08/20/2019   CREATININE 1.33 (H) 08/20/2019   CALCIUM 8.1 (L) 08/20/2019   GFRNONAA 40 (  L) 08/20/2019   GFRAA 46 (L) 08/20/2019    Assessment and Plan  esbl ecoli bacteremia = recovered, and completed treatment course. Discussed that sometimes people have worsening diarrhea roughly 3-4 wk after completion of abtx  - then should be tested for cdifficile if clinically consistent with cdi

## 2019-09-09 DIAGNOSIS — D473 Essential (hemorrhagic) thrombocythemia: Secondary | ICD-10-CM | POA: Diagnosis not present

## 2019-09-09 DIAGNOSIS — E669 Obesity, unspecified: Secondary | ICD-10-CM | POA: Diagnosis not present

## 2019-09-09 DIAGNOSIS — Z6830 Body mass index (BMI) 30.0-30.9, adult: Secondary | ICD-10-CM | POA: Diagnosis not present

## 2019-09-09 DIAGNOSIS — L52 Erythema nodosum: Secondary | ICD-10-CM | POA: Diagnosis not present

## 2019-09-09 DIAGNOSIS — D869 Sarcoidosis, unspecified: Secondary | ICD-10-CM | POA: Diagnosis not present

## 2019-09-09 DIAGNOSIS — Z1589 Genetic susceptibility to other disease: Secondary | ICD-10-CM | POA: Diagnosis not present

## 2019-09-09 DIAGNOSIS — R9389 Abnormal findings on diagnostic imaging of other specified body structures: Secondary | ICD-10-CM | POA: Diagnosis not present

## 2019-09-09 DIAGNOSIS — I7300 Raynaud's syndrome without gangrene: Secondary | ICD-10-CM | POA: Diagnosis not present

## 2019-09-09 DIAGNOSIS — H348112 Central retinal vein occlusion, right eye, stable: Secondary | ICD-10-CM | POA: Diagnosis not present

## 2019-09-09 DIAGNOSIS — M25471 Effusion, right ankle: Secondary | ICD-10-CM | POA: Diagnosis not present

## 2019-09-16 NOTE — Progress Notes (Signed)
HEMATOLOGY/ONCOLOGY CLINIC NOTE  Date of Service: 09/17/19    Patient Care Team: Lawerance Cruel, MD as PCP - General (Family Medicine) [name], MD as PCP - Cardiology (Cardiology)  CHIEF COMPLAINTS/PURPOSE OF CONSULTATION:  F/u for mx of Jak2 MPN  HISTORY OF PRESENTING ILLNESS:   Alexis Murphy is a wonderful 74 y.o. 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 The pt reports that she is doing well overall. She has seen dermatology and rheumatology with work up of her sarcoidosis. She is seeing Dr Christinia Gully in one month in Pulmonology.   She notes that her skin changes first appeared 10 years ago.   The pt reports that her recent nose bx with [facility] a week ago revealed sarcoidosis and her right foot bx revealed stasis dermatitis. She has been diagnosed with sarcoidosis before this. She began a 20mg  prednisone 25-month-taper, three weeks ago with Dr. [name], and is also taking Plaquenil for her sarcoidosis. She had a CT on 09/19/17 to properly work up her sarcoidosis diagnosis.   She notes that prior to taking steroids her feet were very painful and also had some neuropathy. She also had night sweats and leg and ankle swelling but did not have fatigue or shortness of breath. These positive symptoms have resolved after beginning Prednisone.   She denies any breast symptoms. She has a MMG on 08/05/2017 which showed : IMPRESSION: New suspicious left breast mass 7:00 position retroareolar measuring 0.6 cm on ultrasound. Malignancy cannot be excluded. The mass does have similar imaging appearance to the previously biopsied left breast mass (January 2017, which demonstrated granulomatous inflammation and no evidence of malignancy at pathology.) The possibility of a second area of granulomatous inflammation is considered.  Borderline diffuse cortical thickening of a left axillary  lymph node. On review of the patient's prior CT a of the chest, patient does have mild diffuse lymphadenopathy in the thorax. At this time, axillary lymph node biopsy is not felt warranted.  The patient was given a copy of her pathology report from her left breast biopsy from January 2017. I told her she may want to share this information with her rheumatologist.  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   Of note prior to the patient's visit today, pt has had CT Chest completed on 09/19/17 with results revealing 1. Moderate mediastinal and right supraclavicular lymphadenopathy, increased. New mild bilateral axillary and bilateral hilar lymphadenopathy. Findings are worrisome for a malignant process such as a lymphoproliferative disorder or metastatic nodal disease. The right supraclavicular node may be (but not definitely) accessible for percutaneous biopsy. 2. Numerous similar-appearing subsolid spiculated pulmonary nodules scattered throughout both lungs, upper lobe predominant, largest 1.6 cm, stable to mildly increased in size and slightly increased in density in the interval. These are indeterminate, with multifocal lung adenocarcinoma on the differential. 3. Several new subcentimeter hypodense splenic lesions. Solitary small hypodense posterior right liver lobe lesion. These lesions are indeterminate, with lymphoproliferative disorder or metastatic disease not excluded. Options include further characterization with MRI abdomen without and with IV contrast or short-term follow-up CT abdomen with IV contrast in 3 months. 4. Chronic findings include: Left main and 3 vessel coronary atherosclerosis. Small hiatal hernia. Cholelithiasis.   Most recent lab results (03/10/17) of CBC  is as follows: all values are WNL except for WBC at 17.5k, MCV at 75.8, MCH at 25.2, RDW at 17.6,  Platelets at 804k.  On review of systems, pt reports dry mouth, decreased leg swelling, and  denies tingling, numbness in her hands or feet, dry eyes, problems breathing, cough, SOB, CP, nose bleeds, fatigue, abdominal pains, unexpected weight loss, fevers, chills, night sweats.   On PMHx the pt reports sarcoidosis, Bell's palsy twice.  On Social Hx the pt reports that she smoked cigarettes for 45 years. She quit smoking cigarettes 10 years ago but continues to smoke marijuana occasionally.  On Family Hx the pt reports maternal lupus.  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 The patient's last visit with [place] was on 08/20/2019. The pt reports that she is doing well overall.  The pt reports she is doing wonderful. She has been taking Hydroxyurea every day. She has gotten dose 1 of the COVID19 vaccine. Pt is feeling back to normal and full of energy. She is not taking Methotrexate currently since her PCP wanted to see how blood counts were.   Lab results today (09/17/19) of CBC w/diff and CMP is as follows: all values are WNL except for RBC at 3.26, Hemoglobin at 10.1, HCT at 32.5, RDW at 16, BUN at 25, Creatinine at 1.61, Calcium at 8.8, AST at 11, GFR, Est Non Af Am at 31, GFR, Est AFR Am at 36. 09/17/2019 of Reticulocytes: all values are WNL except: RBC at 3.24, Immature Retic Fract at 25  On review of systems, pt reports healthy energy and denies fevers, chills and any other symptoms.    MEDICAL HISTORY:  Past Medical History:  Diagnosis Date  . Arthritis   . Hyperlipidemia   . Sarcoidosis     SURGICAL HISTORY: Past Surgical History:  Procedure Laterality Date  . ABDOMINAL HYSTERECTOMY    . BREAST BIOPSY    . BREAST SURGERY    . REDUCTION MAMMAPLASTY      SOCIAL HISTORY: Social History   Socioeconomic History  . Marital status: Widowed    Spouse name: Not on file  . Number of children: Not on file  . Years of education: Not on file  . Highest education level: Not on file  Occupational History  . Not on file   Tobacco Use  . Smoking status: Former Smoker    Packs/day: 1.00    Years: 45.00    Pack years: 45.00    Types: Cigarettes    Quit date: 10/15/2007    Years since quitting: 11.9  . Smokeless tobacco: Never Used  Substance and Sexual Activity  . Alcohol use: Yes  . Drug use: No  . Sexual activity: Not on file  Other Topics Concern  . Not on file  Social History Narrative  . Not on file   Social Determinants of Health   Financial Resource Strain:   . Difficulty of Paying Living Expenses:   Food Insecurity:   . Worried About Charity fundraiser in the Last Year:   . Arboriculturist in the Last Year:   Transportation Needs:   . Film/video editor (Medical):   Marland Kitchen Lack of Transportation (Non-Medical):   Physical Activity:   . Days of Exercise per Week:   . Minutes of Exercise per Session:   Stress:   . Feeling of Stress :   Social Connections:   . Frequency of Communication with Friends and Family:   . Frequency of Social Gatherings with Friends and Family:   . Attends Religious Services:   .  Active Member of Clubs or Organizations:   . Attends Archivist Meetings:   Marland Kitchen Marital Status:   Intimate Partner Violence:   . Fear of Current or Ex-Partner:   . Emotionally Abused:   Marland Kitchen Physically Abused:   . Sexually Abused:     FAMILY HISTORY: Family History  Problem Relation Age of Onset  . Heart disease Father   . Heart disease Brother     ALLERGIES:  is allergic to penicillins and codeine.  MEDICATIONS:  Current Outpatient Medications  Medication Sig Dispense Refill  . acetaminophen (TYLENOL) 325 MG tablet Take 1 tablet (325 mg total) by mouth every 6 (six) hours. (Patient taking differently: Take 325 mg by mouth every 6 (six) hours as needed for headache. ) 30 tablet 0  . amLODipine (NORVASC) 5 MG tablet Take 5 mg by mouth every evening.     Marland Kitchen aspirin EC 81 MG tablet Take 81 mg by mouth daily.    Marland Kitchen atorvastatin (LIPITOR) 40 MG tablet Take 40 mg by mouth  every evening.     . folic acid (FOLVITE) 1 MG tablet Take 1 mg by mouth daily.    . Melatonin 5 MG TABS Take 5 mg by mouth at bedtime.    . metoprolol tartrate (LOPRESSOR) 25 MG tablet Take 1 tablet (25 mg total) by mouth 2 (two) times daily. 60 tablet 0  . omeprazole (PRILOSEC) 40 MG capsule Take 40 mg by mouth every evening.     . predniSONE (DELTASONE) 5 MG tablet Take 5 mg by mouth daily.    Marland Kitchen venlafaxine XR (EFFEXOR-XR) 150 MG 24 hr capsule Take 150 mg by mouth every evening.      No current facility-administered medications for this visit.    REVIEW OF SYSTEMS:   A 10+ POINT REVIEW OF SYSTEMS WAS OBTAINED including neurology, dermatology, psychiatry, cardiac, respiratory, lymph, extremities, GI, GU, Musculoskeletal, constitutional, breasts, reproductive, HEENT.  All pertinent positives are noted in the HPI.  All others are negative.   PHYSICAL EXAMINATION: ECOG FS:2 - Symptomatic, <50% confined to bed  Vitals:   09/17/19 0931  BP: (!) 147/73  Pulse: 91  Resp: 17  Temp: 98.5 F (36.9 C)  SpO2: 96%   Wt Readings from Last 3 Encounters:  09/17/19 162 lb 8 oz (73.7 kg)  08/20/19 159 lb 14.4 oz (72.5 kg)  08/19/19 162 lb (73.5 kg)   Body mass index is 30.7 kg/m.     GENERAL:alert, in no acute distress and comfortable SKIN: no acute rashes, no significant lesions EYES: conjunctiva are pink and non-injected, sclera anicteric OROPHARYNX: MMM, no exudates, no oropharyngeal erythema or ulceration NECK: supple, no JVD LYMPH:  no palpable lymphadenopathy in the cervical, axillary or inguinal regions LUNGS: clear to auscultation b/l with normal respiratory effort HEART: regular rate & rhythm ABDOMEN:  normoactive bowel sounds , non tender, not distended. Extremity: no pedal edema PSYCH: alert & oriented x 3 with fluent speech NEURO: no focal motor/sensory deficits   LABORATORY DATA:  I have reviewed the data as listed  . CBC Latest Ref Rng & Units 09/17/2019 08/20/2019  08/01/2019  WBC 4.0 - 10.5 K/uL 8.4 8.7 6.9  Hemoglobin 12.0 - 15.0 g/dL 10.1(L) 9.3(L) 9.1(L)  Hematocrit 36.0 - 46.0 % 32.5(L) 29.1(L) 29.0(L)  Platelets 150 - 400 K/uL 349 323 79(L)    . CMP Latest Ref Rng & Units 09/17/2019 08/20/2019 08/01/2019  Glucose 70 - 99 mg/dL 84 94 75  BUN 8 - 23 mg/dL  25(H) 12 27(H)  Creatinine 0.44 - 1.00 mg/dL 1.61(H) 1.33(H) 1.29(H)  Sodium 135 - 145 mmol/L 140 142 137  Potassium 3.5 - 5.1 mmol/L 4.4 3.5 4.0  Chloride 98 - 111 mmol/L 104 109 108  CO2 22 - 32 mmol/L 25 23 18(L)  Calcium 8.9 - 10.3 mg/dL 8.8(L) 8.1(L) 8.2(L)  Total Protein 6.5 - 8.1 g/dL 7.2 6.7 5.8(L)  Total Bilirubin 0.3 - 1.2 mg/dL 0.4 0.6 1.1  Alkaline Phos 38 - 126 U/L 117 140(H) 93  AST 15 - 41 U/L 11(L) 13(L) 23  ALT 0 - 44 U/L 10 9 20            RADIOGRAPHIC STUDIES: I have personally reviewed the radiological images as listed and agreed with the findings in the report. No results found. CT chest 09/19/2017: IMPRESSION: 1. Moderate mediastinal and right supraclavicular lymphadenopathy, increased. New mild bilateral axillary and bilateral hilar lymphadenopathy. Findings are worrisome for a malignant process such as a lymphoproliferative disorder or metastatic nodal disease. The right supraclavicular node may be (but not definitely) accessible for percutaneous biopsy. 2. Numerous similar-appearing subsolid spiculated pulmonary nodules scattered throughout both lungs, upper lobe predominant, largest 1.6 cm, stable to mildly increased in size and slightly increased in density in the interval. These are indeterminate, with multifocal lung adenocarcinoma on the differential. 3. Several new subcentimeter hypodense splenic lesions. Solitary small hypodense posterior right liver lobe lesion. These lesions are indeterminate, with lymphoproliferative disorder or metastatic disease not excluded. Options include further characterization with MRI abdomen without and with IV  contrast or short-term follow-up CT abdomen with IV contrast in 3 months. 4. Chronic findings include: Left main and 3 vessel coronary atherosclerosis. Small hiatal hernia. Cholelithiasis.  Aortic Atherosclerosis (ICD10-I70.0).  Electronically Signed   By: Ilona Sorrel M.D.   On: 09/19/2017 11:54   ASSESSMENT & PLAN:   74 y.o. female with  1. Generalized lymphadenopathy with multiple pulmonary nodules CT Chest 09/19/2017: Moderate mediastinal and right supraclavicular lymphadenopathy, increased. New mild bilateral axillary and bilateral hilar lymphadenopathy. Findings are worrisome for a malignant process such as a lymphoproliferative disorder or metastatic nodal disease. The right supraclavicular node may be (but not definitely) accessible for percutaneous biopsy. 2. Numerous similar-appearing subsolid spiculated pulmonary nodules scattered throughout both lungs, upper lobe predominant, largest 1.6 cm, stable to mildly increased in size and slightly increased in density in the interval. These are indeterminate, with multifocal lung adenocarcinoma on the differential.  12/26/17 CT C/A/P revealed interval response to steroids in LNadenopathy   07/17/18 CT C/A/P revealed Continue improvement in mediastinal adenopathy. Lymph nodes are now not pathologic by size criteria. 2. Continued improvement in nodular and ground-glass densities particularly in the RIGHT upper lobe. 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  2. Breast - lesion - Bx -- granulomatous inflammation -consistent with sarcoidosis.  3. Skin rash on nose - Bx consistent with sarcoidosis.  4. Iron deficiency - no significant anemia . Lab Results  Component Value Date   IRON 123 11/03/2018   TIBC 354 11/03/2018   IRONPCTSAT 35 11/03/2018   (Iron and TIBC)  Lab Results  Component Value Date   FERRITIN 97 07/26/2019   PLAN - GI w/u - will defer to PCP -?GI involvement by sarcoidosis.  5. 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 -Discussed 09/17/2019 of Reticulocytes: all values are WNL except: RBC at 3.24, Immature Retic Fract at 25 -Recommended taking low doses of Methotrexate due to anemia -Continue Hydroxyurea, Asprin, Folic Acid supplement -PLTs at goal, mild anemia    FOLLOW UP: RTC with Dr Irene Limbo with labs in 2 months   The total time spent in the appt was 10 minutes and more than 50% was on counseling and direct patient cares.  All of the patient's questions were answered with apparent satisfaction. The patient knows to call the clinic with any problems, questions or concerns.  Sullivan Lone MD MS AAHIVMS [facility] Hematology/Oncology Physician [facility]  (Office):       [phone] (Work cell):  [phone] (Fax):           [phone]  09/17/2019 2:18 PM  I, Dawayne Cirri am acting as a Education administrator for Dr. Sullivan Lone.   .I have reviewed the above documentation for accuracy and completeness, and I agree with the above. Brunetta Genera MD

## 2019-09-17 ENCOUNTER — Inpatient Hospital Stay: Payer: Medicare HMO | Attending: Hematology

## 2019-09-17 ENCOUNTER — Other Ambulatory Visit: Payer: Self-pay

## 2019-09-17 ENCOUNTER — Inpatient Hospital Stay: Payer: Medicare HMO | Admitting: Hematology

## 2019-09-17 VITALS — BP 147/73 | HR 91 | Temp 98.5°F | Resp 17 | Ht 61.0 in | Wt 162.5 lb

## 2019-09-17 DIAGNOSIS — C946 Myelodysplastic disease, not classified: Secondary | ICD-10-CM | POA: Diagnosis not present

## 2019-09-17 DIAGNOSIS — E611 Iron deficiency: Secondary | ICD-10-CM | POA: Insufficient documentation

## 2019-09-17 DIAGNOSIS — D473 Essential (hemorrhagic) thrombocythemia: Secondary | ICD-10-CM

## 2019-09-17 DIAGNOSIS — D863 Sarcoidosis of skin: Secondary | ICD-10-CM | POA: Insufficient documentation

## 2019-09-17 DIAGNOSIS — Z87891 Personal history of nicotine dependence: Secondary | ICD-10-CM | POA: Insufficient documentation

## 2019-09-17 DIAGNOSIS — Z79899 Other long term (current) drug therapy: Secondary | ICD-10-CM | POA: Insufficient documentation

## 2019-09-17 DIAGNOSIS — R591 Generalized enlarged lymph nodes: Secondary | ICD-10-CM | POA: Diagnosis not present

## 2019-09-17 LAB — CMP (CANCER CENTER ONLY)
ALT: 10 U/L (ref 0–44)
AST: 11 U/L — ABNORMAL LOW (ref 15–41)
Albumin: 4 g/dL (ref 3.5–5.0)
Alkaline Phosphatase: 117 U/L (ref 38–126)
Anion gap: 11 (ref 5–15)
BUN: 25 mg/dL — ABNORMAL HIGH (ref 8–23)
CO2: 25 mmol/L (ref 22–32)
Calcium: 8.8 mg/dL — ABNORMAL LOW (ref 8.9–10.3)
Chloride: 104 mmol/L (ref 98–111)
Creatinine: 1.61 mg/dL — ABNORMAL HIGH (ref 0.44–1.00)
GFR, Est AFR Am: 36 mL/min — ABNORMAL LOW (ref >60)
GFR, Estimated: 31 mL/min — ABNORMAL LOW (ref >60)
Glucose, Bld: 84 mg/dL (ref 70–99)
Potassium: 4.4 mmol/L (ref 3.5–5.1)
Sodium: 140 mmol/L (ref 135–145)
Total Bilirubin: 0.4 mg/dL (ref 0.3–1.2)
Total Protein: 7.2 g/dL (ref 6.5–8.1)

## 2019-09-17 LAB — CBC WITH DIFFERENTIAL/PLATELET
Abs Immature Granulocytes: 0.05 10*3/uL (ref 0.00–0.07)
Basophils Absolute: 0 10*3/uL (ref 0.0–0.1)
Basophils Relative: 0 %
Eosinophils Absolute: 0.2 10*3/uL (ref 0.0–0.5)
Eosinophils Relative: 2 %
HCT: 32.5 % — ABNORMAL LOW (ref 36.0–46.0)
Hemoglobin: 10.1 g/dL — ABNORMAL LOW (ref 12.0–15.0)
Immature Granulocytes: 1 %
Lymphocytes Relative: 22 %
Lymphs Abs: 1.8 10*3/uL (ref 0.7–4.0)
MCH: 31 pg (ref 26.0–34.0)
MCHC: 31.1 g/dL (ref 30.0–36.0)
MCV: 99.7 fL (ref 80.0–100.0)
Monocytes Absolute: 0.5 10*3/uL (ref 0.1–1.0)
Monocytes Relative: 6 %
Neutro Abs: 5.7 10*3/uL (ref 1.7–7.7)
Neutrophils Relative %: 69 %
Platelets: 349 10*3/uL (ref 150–400)
RBC: 3.26 MIL/uL — ABNORMAL LOW (ref 3.87–5.11)
RDW: 16 % — ABNORMAL HIGH (ref 11.5–15.5)
WBC: 8.4 10*3/uL (ref 4.0–10.5)
nRBC: 0 % (ref 0.0–0.2)

## 2019-09-17 LAB — RETICULOCYTES
Immature Retic Fract: 25 % — ABNORMAL HIGH (ref 2.3–15.9)
RBC.: 3.24 MIL/uL — ABNORMAL LOW (ref 3.87–5.11)
Retic Count, Absolute: 82 10*3/uL (ref 19.0–186.0)
Retic Ct Pct: 2.5 % (ref 0.4–3.1)

## 2019-09-18 ENCOUNTER — Telehealth: Payer: Self-pay | Admitting: Hematology

## 2019-09-18 NOTE — Telephone Encounter (Signed)
Scheduled per los, patient has been called and notified. 

## 2019-10-12 DIAGNOSIS — R69 Illness, unspecified: Secondary | ICD-10-CM | POA: Diagnosis not present

## 2019-10-12 DIAGNOSIS — N183 Chronic kidney disease, stage 3 unspecified: Secondary | ICD-10-CM | POA: Diagnosis not present

## 2019-10-12 DIAGNOSIS — E7800 Pure hypercholesterolemia, unspecified: Secondary | ICD-10-CM | POA: Diagnosis not present

## 2019-10-12 DIAGNOSIS — M1990 Unspecified osteoarthritis, unspecified site: Secondary | ICD-10-CM | POA: Diagnosis not present

## 2019-10-12 DIAGNOSIS — I4891 Unspecified atrial fibrillation: Secondary | ICD-10-CM | POA: Diagnosis not present

## 2019-10-20 DIAGNOSIS — Z79899 Other long term (current) drug therapy: Secondary | ICD-10-CM | POA: Diagnosis not present

## 2019-10-20 DIAGNOSIS — N183 Chronic kidney disease, stage 3 unspecified: Secondary | ICD-10-CM | POA: Diagnosis not present

## 2019-10-20 DIAGNOSIS — I4891 Unspecified atrial fibrillation: Secondary | ICD-10-CM | POA: Diagnosis not present

## 2019-10-20 DIAGNOSIS — Z09 Encounter for follow-up examination after completed treatment for conditions other than malignant neoplasm: Secondary | ICD-10-CM | POA: Diagnosis not present

## 2019-10-20 DIAGNOSIS — Z862 Personal history of diseases of the blood and blood-forming organs and certain disorders involving the immune mechanism: Secondary | ICD-10-CM | POA: Diagnosis not present

## 2019-10-20 DIAGNOSIS — R05 Cough: Secondary | ICD-10-CM | POA: Diagnosis not present

## 2019-10-20 DIAGNOSIS — E7800 Pure hypercholesterolemia, unspecified: Secondary | ICD-10-CM | POA: Diagnosis not present

## 2019-11-06 DIAGNOSIS — H524 Presbyopia: Secondary | ICD-10-CM | POA: Diagnosis not present

## 2019-11-18 NOTE — Progress Notes (Signed)
HEMATOLOGY/ONCOLOGY CLINIC NOTE  Date of Service: 11/19/19    Patient Care Team: Lawerance Cruel, MD as PCP - General (Family Medicine) [name], MD as PCP - Cardiology (Cardiology)  CHIEF COMPLAINTS/PURPOSE OF CONSULTATION:  F/u for mx of Jak2 MPN  HISTORY OF PRESENTING ILLNESS:   Alexis Murphy is a wonderful 74 y.o. 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 The pt reports that she is doing well overall. She has seen dermatology and rheumatology with work up of her sarcoidosis. She is seeing Dr Christinia Gully in one month in Pulmonology.   She notes that her skin changes first appeared 10 years ago.   The pt reports that her recent nose bx with [facility] a week ago revealed sarcoidosis and her right foot bx revealed stasis dermatitis. She has been diagnosed with sarcoidosis before this. She began a 20mg  prednisone 66-month-taper, three weeks ago with Dr. [name], and is also taking Plaquenil for her sarcoidosis. She had a CT on 09/19/17 to properly work up her sarcoidosis diagnosis.   She notes that prior to taking steroids her feet were very painful and also had some neuropathy. She also had night sweats and leg and ankle swelling but did not have fatigue or shortness of breath. These positive symptoms have resolved after beginning Prednisone.   She denies any breast symptoms. She has a MMG on 08/05/2017 which showed : IMPRESSION: New suspicious left breast mass 7:00 position retroareolar measuring 0.6 cm on ultrasound. Malignancy cannot be excluded. The mass does have similar imaging appearance to the previously biopsied left breast mass (January 2017, which demonstrated granulomatous inflammation and no evidence of malignancy at pathology.) The possibility of a second area of granulomatous inflammation is considered.  Borderline diffuse cortical thickening of a left axillary  lymph node. On review of the patient's prior CT a of the chest, patient does have mild diffuse lymphadenopathy in the thorax. At this time, axillary lymph node biopsy is not felt warranted.  The patient was given a copy of her pathology report from her left breast biopsy from January 2017. I told her she may want to share this information with her rheumatologist.  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   Of note prior to the patient's visit today, pt has had CT Chest completed on 09/19/17 with results revealing 1. Moderate mediastinal and right supraclavicular lymphadenopathy, increased. New mild bilateral axillary and bilateral hilar lymphadenopathy. Findings are worrisome for a malignant process such as a lymphoproliferative disorder or metastatic nodal disease. The right supraclavicular node may be (but not definitely) accessible for percutaneous biopsy. 2. Numerous similar-appearing subsolid spiculated pulmonary nodules scattered throughout both lungs, upper lobe predominant, largest 1.6 cm, stable to mildly increased in size and slightly increased in density in the interval. These are indeterminate, with multifocal lung adenocarcinoma on the differential. 3. Several new subcentimeter hypodense splenic lesions. Solitary small hypodense posterior right liver lobe lesion. These lesions are indeterminate, with lymphoproliferative disorder or metastatic disease not excluded. Options include further characterization with MRI abdomen without and with IV contrast or short-term follow-up CT abdomen with IV contrast in 3 months. 4. Chronic findings include: Left main and 3 vessel coronary atherosclerosis. Small hiatal hernia. Cholelithiasis.   Most recent lab results (03/10/17) of CBC  is as follows: all values are WNL except for WBC at 17.5k, MCV at 75.8, MCH at 25.2, RDW at 17.6,  Platelets at 804k.  On review of systems, pt reports dry mouth, decreased leg swelling, and  denies tingling, numbness in her hands or feet, dry eyes, problems breathing, cough, SOB, CP, nose bleeds, fatigue, abdominal pains, unexpected weight loss, fevers, chills, night sweats.   On PMHx the pt reports sarcoidosis, Bell's palsy twice.  On Social Hx the pt reports that she smoked cigarettes for 45 years. She quit smoking cigarettes 10 years ago but continues to smoke marijuana occasionally.  On Family Hx the pt reports maternal lupus.  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 The patient's last visit with [place] was on 09/17/19. The pt reports that she is doing well overall.  The pt reports she is good. Pt had a fall recently where she stepped off a ladder and fell. She is not taking methotrexate. Pt sees her rheumatologist in June. She is taking 500mg  of hydroxyurea daily. Pt is taking folic acid. She had had both doses of COVID19 vaccine.   Lab results today (11/19/19) of CBC w/diff and CMP is as follows: all values are WNL except for RBC at 3.38, Hemoglobin at 10.5, HCT at 32.7, RDW at 16.7, Platelets at 413K, Potassium at 3.1, Glucose at 135, Creatinine at 1.65, GFR, Est Non Af Am at 30, GFR, Est AFR Am at 35, Anion Gap at 16 11/19/19 of LDH at 201  On review of systems, pt reports healthy appetite, staying hydrated and denies pedal edema, fever, chills, urinary infection and any other symptoms.   MEDICAL HISTORY:  Past Medical History:  Diagnosis Date  . Arthritis   . Hyperlipidemia   . Sarcoidosis     SURGICAL HISTORY: Past Surgical History:  Procedure Laterality Date  . ABDOMINAL HYSTERECTOMY    . BREAST BIOPSY    . BREAST SURGERY    . REDUCTION MAMMAPLASTY      SOCIAL HISTORY: Social History   Socioeconomic History  . Marital status: Widowed    Spouse name: Not on file  . Number of children: Not on file  . Years of education: Not on file  . Highest education level: Not on file  Occupational History  . Not on  file  Tobacco Use  . Smoking status: Former Smoker    Packs/day: 1.00    Years: 45.00    Pack years: 45.00    Types: Cigarettes    Quit date: 10/15/2007    Years since quitting: 12.1  . Smokeless tobacco: Never Used  Substance and Sexual Activity  . Alcohol use: Yes  . Drug use: No  . Sexual activity: Not on file  Other Topics Concern  . Not on file  Social History Narrative  . Not on file   Social Determinants of Health   Financial Resource Strain:   . Difficulty of Paying Living Expenses:   Food Insecurity:   . Worried About Charity fundraiser in the Last Year:   . Arboriculturist in the Last Year:   Transportation Needs:   . Film/video editor (Medical):   Marland Kitchen Lack of Transportation (Non-Medical):   Physical Activity:   . Days of Exercise per Week:   . Minutes of Exercise per Session:   Stress:   . Feeling of Stress :   Social Connections:   . Frequency of Communication with Friends and Family:   . Frequency of Social Gatherings with Friends and Family:   . Attends Religious Services:   . Active  Member of Clubs or Organizations:   . Attends Archivist Meetings:   Marland Kitchen Marital Status:   Intimate Partner Violence:   . Fear of Current or Ex-Partner:   . Emotionally Abused:   Marland Kitchen Physically Abused:   . Sexually Abused:     FAMILY HISTORY: Family History  Problem Relation Age of Onset  . Heart disease Father   . Heart disease Brother     ALLERGIES:  is allergic to penicillins and codeine.  MEDICATIONS:  Current Outpatient Medications  Medication Sig Dispense Refill  . acetaminophen (TYLENOL) 325 MG tablet Take 1 tablet (325 mg total) by mouth every 6 (six) hours. (Patient taking differently: Take 325 mg by mouth every 6 (six) hours as needed for headache. ) 30 tablet 0  . amLODipine (NORVASC) 5 MG tablet Take 5 mg by mouth every evening.     Marland Kitchen aspirin EC 81 MG tablet Take 81 mg by mouth daily.    Marland Kitchen atorvastatin (LIPITOR) 40 MG tablet Take 40 mg by  mouth every evening.     . Melatonin 5 MG TABS Take 5 mg by mouth at bedtime.    Marland Kitchen omeprazole (PRILOSEC) 40 MG capsule Take 40 mg by mouth every evening.     . predniSONE (DELTASONE) 5 MG tablet Take 5 mg by mouth daily.    Marland Kitchen venlafaxine XR (EFFEXOR-XR) 150 MG 24 hr capsule Take 150 mg by mouth every evening.     . folic acid (FOLVITE) 1 MG tablet Take 1 mg by mouth daily.    . metoprolol tartrate (LOPRESSOR) 25 MG tablet Take 1 tablet (25 mg total) by mouth 2 (two) times daily. 60 tablet 0   No current facility-administered medications for this visit.    REVIEW OF SYSTEMS:   A 10+ POINT REVIEW OF SYSTEMS WAS OBTAINED including neurology, dermatology, psychiatry, cardiac, respiratory, lymph, extremities, GI, GU, Musculoskeletal, constitutional, breasts, reproductive, HEENT.  All pertinent positives are noted in the HPI.  All others are negative.   PHYSICAL EXAMINATION: ECOG FS:2 - Symptomatic, <50% confined to bed  Vitals:   11/19/19 1358  BP: (!) 154/75  Pulse: 97  Resp: 18  Temp: 98.5 F (36.9 C)  SpO2: 100%   Wt Readings from Last 3 Encounters:  11/19/19 160 lb 4.8 oz (72.7 kg)  09/17/19 162 lb 8 oz (73.7 kg)  08/20/19 159 lb 14.4 oz (72.5 kg)   Body mass index is 30.29 kg/m.    GENERAL:alert, in no acute distress and comfortable SKIN: no acute rashes, no significant lesions EYES: conjunctiva are pink and non-injected, sclera anicteric OROPHARYNX: MMM, no exudates, no oropharyngeal erythema or ulceration NECK: supple, no JVD LYMPH:  no palpable lymphadenopathy in the cervical, axillary or inguinal regions LUNGS: clear to auscultation b/l with normal respiratory effort HEART: regular rate & rhythm ABDOMEN:  normoactive bowel sounds , non tender, not distended. Extremity: no pedal edema PSYCH: alert & oriented x 3 with fluent speech NEURO: no focal motor/sensory deficits  LABORATORY DATA:  I have reviewed the data as listed  . CBC Latest Ref Rng & Units 11/19/2019  09/17/2019 08/20/2019  WBC 4.0 - 10.5 K/uL 8.4 8.4 8.7  Hemoglobin 12.0 - 15.0 g/dL 10.5(L) 10.1(L) 9.3(L)  Hematocrit 36.0 - 46.0 % 32.7(L) 32.5(L) 29.1(L)  Platelets 150 - 400 K/uL 413(H) 349 323    . CMP Latest Ref Rng & Units 11/19/2019 09/17/2019 08/20/2019  Glucose 70 - 99 mg/dL 135(H) 84 94  BUN 8 - 23 mg/dL 21  25(H) 12  Creatinine 0.44 - 1.00 mg/dL 1.65(H) 1.61(H) 1.33(H)  Sodium 135 - 145 mmol/L 139 140 142  Potassium 3.5 - 5.1 mmol/L 3.1(L) 4.4 3.5  Chloride 98 - 111 mmol/L 101 104 109  CO2 22 - 32 mmol/L 22 25 23   Calcium 8.9 - 10.3 mg/dL 9.1 8.8(L) 8.1(L)  Total Protein 6.5 - 8.1 g/dL 7.2 7.2 6.7  Total Bilirubin 0.3 - 1.2 mg/dL 0.4 0.4 0.6  Alkaline Phos 38 - 126 U/L 100 117 140(H)  AST 15 - 41 U/L 16 11(L) 13(L)  ALT 0 - 44 U/L 12 10 9            RADIOGRAPHIC STUDIES: I have personally reviewed the radiological images as listed and agreed with the findings in the report. No results found. CT chest 09/19/2017: IMPRESSION: 1. Moderate mediastinal and right supraclavicular lymphadenopathy, increased. New mild bilateral axillary and bilateral hilar lymphadenopathy. Findings are worrisome for a malignant process such as a lymphoproliferative disorder or metastatic nodal disease. The right supraclavicular node may be (but not definitely) accessible for percutaneous biopsy. 2. Numerous similar-appearing subsolid spiculated pulmonary nodules scattered throughout both lungs, upper lobe predominant, largest 1.6 cm, stable to mildly increased in size and slightly increased in density in the interval. These are indeterminate, with multifocal lung adenocarcinoma on the differential. 3. Several new subcentimeter hypodense splenic lesions. Solitary small hypodense posterior right liver lobe lesion. These lesions are indeterminate, with lymphoproliferative disorder or metastatic disease not excluded. Options include further characterization with MRI abdomen without and with  IV contrast or short-term follow-up CT abdomen with IV contrast in 3 months. 4. Chronic findings include: Left main and 3 vessel coronary atherosclerosis. Small hiatal hernia. Cholelithiasis.  Aortic Atherosclerosis (ICD10-I70.0).  Electronically Signed   By: Ilona Sorrel M.D.   On: 09/19/2017 11:54   ASSESSMENT & PLAN:   74 y.o. female with  1. Generalized lymphadenopathy with multiple pulmonary nodules CT Chest 09/19/2017: Moderate mediastinal and right supraclavicular lymphadenopathy, increased. New mild bilateral axillary and bilateral hilar lymphadenopathy. Findings are worrisome for a malignant process such as a lymphoproliferative disorder or metastatic nodal disease. The right supraclavicular node may be (but not definitely) accessible for percutaneous biopsy. 2. Numerous similar-appearing subsolid spiculated pulmonary nodules scattered throughout both lungs, upper lobe predominant, largest 1.6 cm, stable to mildly increased in size and slightly increased in density in the interval. These are indeterminate, with multifocal lung adenocarcinoma on the differential.  12/26/17 CT C/A/P revealed interval response to steroids in LNadenopathy   07/17/18 CT C/A/P revealed Continue improvement in mediastinal adenopathy. Lymph nodes are now not pathologic by size criteria. 2. Continued improvement in nodular and ground-glass densities particularly in the RIGHT upper lobe. 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  2. Breast - lesion - Bx -- granulomatous inflammation -consistent with sarcoidosis.  3. Skin rash on nose - Bx consistent with sarcoidosis.  4. Iron deficiency - no significant anemia . Lab Results  Component Value Date   IRON 123 11/03/2018   TIBC 354 11/03/2018   IRONPCTSAT 35 11/03/2018   (Iron and TIBC)  Lab Results  Component Value Date   FERRITIN 97 07/26/2019   PLAN - GI w/u - will defer to PCP -?GI involvement by sarcoidosis.  5. 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   PLAN: -Discussed pt labwork today, 11/19/19; of CBC w/diff and CMP is as follows: all values are WNL except for RBC at 3.38, Hemoglobin at 10.5, HCT at 32.7, RDW at 16.7, Platelets at 413K, Potassium at 3.1, Glucose at 135, Creatinine at 1.65, GFR, Est Non Af Am at 30, GFR, Est AFR Am at 35, Anion Gap at 16 -Discussed 11/19/19 of LDH at 201 -Advised on methotrexate -Advised on losing Potassium- Prednisone drops potassium  -Recommends taking Potassium replacement and eating potassium rich foods  -Recommends stopping drinking earlier in night so reduce about of using bathroom at night -Continue Hydroxyurea, Asprin -Recommends continuing folic acid supplement  -Recommends taking B-complex   -F/u with rheumatologist  -Will see back in 3 months   FOLLOW UP: RTC with Dr Irene Limbo with labs in 3 months  The total time spent in the appt was 20 minutes and more than 50% was on counseling and direct patient cares.  All of the patient's questions were answered with apparent satisfaction. The patient knows to call the clinic with any problems, questions or concerns.  Sullivan Lone MD [place] AAHIVMS [facility] Hematology/Oncology Physician [facility]  (Office):       [phone] (Work cell):  [phone] (Fax):           [phone]  11/19/2019 2:55 PM  I, Dawayne Cirri am acting as a Education administrator for Dr. Sullivan Lone.   .I have reviewed the above documentation for accuracy and completeness, and I agree with the above. Brunetta Genera MD

## 2019-11-19 ENCOUNTER — Inpatient Hospital Stay: Payer: Medicare HMO | Attending: Hematology

## 2019-11-19 ENCOUNTER — Inpatient Hospital Stay: Payer: Medicare HMO | Admitting: Hematology

## 2019-11-19 ENCOUNTER — Other Ambulatory Visit: Payer: Self-pay

## 2019-11-19 VITALS — BP 154/75 | HR 97 | Temp 98.5°F | Resp 18 | Ht 61.0 in | Wt 160.3 lb

## 2019-11-19 DIAGNOSIS — I700 Atherosclerosis of aorta: Secondary | ICD-10-CM | POA: Insufficient documentation

## 2019-11-19 DIAGNOSIS — D863 Sarcoidosis of skin: Secondary | ICD-10-CM | POA: Insufficient documentation

## 2019-11-19 DIAGNOSIS — E876 Hypokalemia: Secondary | ICD-10-CM | POA: Diagnosis not present

## 2019-11-19 DIAGNOSIS — Z79899 Other long term (current) drug therapy: Secondary | ICD-10-CM | POA: Insufficient documentation

## 2019-11-19 DIAGNOSIS — D473 Essential (hemorrhagic) thrombocythemia: Secondary | ICD-10-CM | POA: Diagnosis not present

## 2019-11-19 DIAGNOSIS — C946 Myelodysplastic disease, not classified: Secondary | ICD-10-CM | POA: Diagnosis present

## 2019-11-19 DIAGNOSIS — Z87891 Personal history of nicotine dependence: Secondary | ICD-10-CM | POA: Insufficient documentation

## 2019-11-19 DIAGNOSIS — E611 Iron deficiency: Secondary | ICD-10-CM | POA: Diagnosis not present

## 2019-11-19 DIAGNOSIS — R591 Generalized enlarged lymph nodes: Secondary | ICD-10-CM | POA: Diagnosis not present

## 2019-11-19 LAB — CMP (CANCER CENTER ONLY)
ALT: 12 U/L (ref 0–44)
AST: 16 U/L (ref 15–41)
Albumin: 4.1 g/dL (ref 3.5–5.0)
Alkaline Phosphatase: 100 U/L (ref 38–126)
Anion gap: 16 — ABNORMAL HIGH (ref 5–15)
BUN: 21 mg/dL (ref 8–23)
CO2: 22 mmol/L (ref 22–32)
Calcium: 9.1 mg/dL (ref 8.9–10.3)
Chloride: 101 mmol/L (ref 98–111)
Creatinine: 1.65 mg/dL — ABNORMAL HIGH (ref 0.44–1.00)
GFR, Est AFR Am: 35 mL/min — ABNORMAL LOW (ref >60)
GFR, Estimated: 30 mL/min — ABNORMAL LOW (ref >60)
Glucose, Bld: 135 mg/dL — ABNORMAL HIGH (ref 70–99)
Potassium: 3.1 mmol/L — ABNORMAL LOW (ref 3.5–5.1)
Sodium: 139 mmol/L (ref 135–145)
Total Bilirubin: 0.4 mg/dL (ref 0.3–1.2)
Total Protein: 7.2 g/dL (ref 6.5–8.1)

## 2019-11-19 LAB — CBC WITH DIFFERENTIAL/PLATELET
Abs Immature Granulocytes: 0.06 10*3/uL (ref 0.00–0.07)
Basophils Absolute: 0 10*3/uL (ref 0.0–0.1)
Basophils Relative: 1 %
Eosinophils Absolute: 0.1 10*3/uL (ref 0.0–0.5)
Eosinophils Relative: 1 %
HCT: 32.7 % — ABNORMAL LOW (ref 36.0–46.0)
Hemoglobin: 10.5 g/dL — ABNORMAL LOW (ref 12.0–15.0)
Immature Granulocytes: 1 %
Lymphocytes Relative: 19 %
Lymphs Abs: 1.6 10*3/uL (ref 0.7–4.0)
MCH: 31.1 pg (ref 26.0–34.0)
MCHC: 32.1 g/dL (ref 30.0–36.0)
MCV: 96.7 fL (ref 80.0–100.0)
Monocytes Absolute: 0.5 10*3/uL (ref 0.1–1.0)
Monocytes Relative: 6 %
Neutro Abs: 6.2 10*3/uL (ref 1.7–7.7)
Neutrophils Relative %: 72 %
Platelets: 413 10*3/uL — ABNORMAL HIGH (ref 150–400)
RBC: 3.38 MIL/uL — ABNORMAL LOW (ref 3.87–5.11)
RDW: 16.7 % — ABNORMAL HIGH (ref 11.5–15.5)
WBC: 8.4 10*3/uL (ref 4.0–10.5)
nRBC: 0 % (ref 0.0–0.2)

## 2019-11-19 LAB — LACTATE DEHYDROGENASE: LDH: 201 U/L — ABNORMAL HIGH (ref 98–192)

## 2019-11-20 ENCOUNTER — Telehealth: Payer: Self-pay | Admitting: Hematology

## 2019-11-20 NOTE — Telephone Encounter (Signed)
Scheduled per 05/13 los, patient has been called and notified.

## 2019-11-24 ENCOUNTER — Other Ambulatory Visit: Payer: Self-pay | Admitting: Hematology

## 2019-11-24 MED ORDER — POTASSIUM CHLORIDE CRYS ER 20 MEQ PO TBCR: 20.0000 meq | EXTENDED_RELEASE_TABLET | Freq: Every day | ORAL | 0 refills | Status: DC

## 2019-11-24 NOTE — Progress Notes (Unsigned)
potassium

## 2019-12-02 DIAGNOSIS — Z862 Personal history of diseases of the blood and blood-forming organs and certain disorders involving the immune mechanism: Secondary | ICD-10-CM | POA: Diagnosis not present

## 2019-12-02 DIAGNOSIS — N183 Chronic kidney disease, stage 3 unspecified: Secondary | ICD-10-CM | POA: Diagnosis not present

## 2019-12-02 DIAGNOSIS — I4891 Unspecified atrial fibrillation: Secondary | ICD-10-CM | POA: Diagnosis not present

## 2019-12-02 DIAGNOSIS — R05 Cough: Secondary | ICD-10-CM | POA: Diagnosis not present

## 2019-12-02 DIAGNOSIS — E7800 Pure hypercholesterolemia, unspecified: Secondary | ICD-10-CM | POA: Diagnosis not present

## 2019-12-02 DIAGNOSIS — Z79899 Other long term (current) drug therapy: Secondary | ICD-10-CM | POA: Diagnosis not present

## 2019-12-02 DIAGNOSIS — Z09 Encounter for follow-up examination after completed treatment for conditions other than malignant neoplasm: Secondary | ICD-10-CM | POA: Diagnosis not present

## 2019-12-04 DIAGNOSIS — R69 Illness, unspecified: Secondary | ICD-10-CM | POA: Diagnosis not present

## 2019-12-04 DIAGNOSIS — M1990 Unspecified osteoarthritis, unspecified site: Secondary | ICD-10-CM | POA: Diagnosis not present

## 2019-12-04 DIAGNOSIS — N183 Chronic kidney disease, stage 3 unspecified: Secondary | ICD-10-CM | POA: Diagnosis not present

## 2019-12-04 DIAGNOSIS — E7800 Pure hypercholesterolemia, unspecified: Secondary | ICD-10-CM | POA: Diagnosis not present

## 2019-12-04 DIAGNOSIS — I4891 Unspecified atrial fibrillation: Secondary | ICD-10-CM | POA: Diagnosis not present

## 2019-12-11 DIAGNOSIS — Z0000 Encounter for general adult medical examination without abnormal findings: Secondary | ICD-10-CM | POA: Diagnosis not present

## 2019-12-11 DIAGNOSIS — R69 Illness, unspecified: Secondary | ICD-10-CM | POA: Diagnosis not present

## 2019-12-11 DIAGNOSIS — N183 Chronic kidney disease, stage 3 unspecified: Secondary | ICD-10-CM | POA: Diagnosis not present

## 2019-12-11 DIAGNOSIS — E7800 Pure hypercholesterolemia, unspecified: Secondary | ICD-10-CM | POA: Diagnosis not present

## 2019-12-11 DIAGNOSIS — K219 Gastro-esophageal reflux disease without esophagitis: Secondary | ICD-10-CM | POA: Diagnosis not present

## 2019-12-11 DIAGNOSIS — D473 Essential (hemorrhagic) thrombocythemia: Secondary | ICD-10-CM | POA: Diagnosis not present

## 2019-12-11 DIAGNOSIS — I7300 Raynaud's syndrome without gangrene: Secondary | ICD-10-CM | POA: Diagnosis not present

## 2019-12-11 DIAGNOSIS — E039 Hypothyroidism, unspecified: Secondary | ICD-10-CM | POA: Diagnosis not present

## 2019-12-11 DIAGNOSIS — Z79899 Other long term (current) drug therapy: Secondary | ICD-10-CM | POA: Diagnosis not present

## 2019-12-14 DIAGNOSIS — I7300 Raynaud's syndrome without gangrene: Secondary | ICD-10-CM | POA: Diagnosis not present

## 2019-12-14 DIAGNOSIS — Z1589 Genetic susceptibility to other disease: Secondary | ICD-10-CM | POA: Diagnosis not present

## 2019-12-14 DIAGNOSIS — Z7952 Long term (current) use of systemic steroids: Secondary | ICD-10-CM | POA: Diagnosis not present

## 2019-12-14 DIAGNOSIS — R69 Illness, unspecified: Secondary | ICD-10-CM | POA: Diagnosis not present

## 2019-12-14 DIAGNOSIS — H348112 Central retinal vein occlusion, right eye, stable: Secondary | ICD-10-CM | POA: Diagnosis not present

## 2019-12-14 DIAGNOSIS — R9389 Abnormal findings on diagnostic imaging of other specified body structures: Secondary | ICD-10-CM | POA: Diagnosis not present

## 2019-12-14 DIAGNOSIS — M25471 Effusion, right ankle: Secondary | ICD-10-CM | POA: Diagnosis not present

## 2019-12-14 DIAGNOSIS — D869 Sarcoidosis, unspecified: Secondary | ICD-10-CM | POA: Diagnosis not present

## 2019-12-14 DIAGNOSIS — D473 Essential (hemorrhagic) thrombocythemia: Secondary | ICD-10-CM | POA: Diagnosis not present

## 2019-12-14 DIAGNOSIS — Z6829 Body mass index (BMI) 29.0-29.9, adult: Secondary | ICD-10-CM | POA: Diagnosis not present

## 2019-12-16 ENCOUNTER — Other Ambulatory Visit: Payer: Self-pay | Admitting: Hematology

## 2019-12-28 DIAGNOSIS — E039 Hypothyroidism, unspecified: Secondary | ICD-10-CM | POA: Diagnosis not present

## 2019-12-28 DIAGNOSIS — E7800 Pure hypercholesterolemia, unspecified: Secondary | ICD-10-CM | POA: Diagnosis not present

## 2019-12-28 DIAGNOSIS — R69 Illness, unspecified: Secondary | ICD-10-CM | POA: Diagnosis not present

## 2019-12-28 DIAGNOSIS — N183 Chronic kidney disease, stage 3 unspecified: Secondary | ICD-10-CM | POA: Diagnosis not present

## 2019-12-28 DIAGNOSIS — I4891 Unspecified atrial fibrillation: Secondary | ICD-10-CM | POA: Diagnosis not present

## 2019-12-28 DIAGNOSIS — M1990 Unspecified osteoarthritis, unspecified site: Secondary | ICD-10-CM | POA: Diagnosis not present

## 2020-01-10 ENCOUNTER — Other Ambulatory Visit: Payer: Self-pay | Admitting: Hematology

## 2020-01-25 ENCOUNTER — Other Ambulatory Visit: Payer: Self-pay | Admitting: Hematology

## 2020-01-25 NOTE — Telephone Encounter (Signed)
Patient request new 90 day RX - last ordered for 30 day supply 2 weeks ago

## 2020-01-26 ENCOUNTER — Other Ambulatory Visit: Payer: Self-pay | Admitting: *Deleted

## 2020-01-26 DIAGNOSIS — D473 Essential (hemorrhagic) thrombocythemia: Secondary | ICD-10-CM

## 2020-01-26 DIAGNOSIS — E876 Hypokalemia: Secondary | ICD-10-CM

## 2020-01-26 IMAGING — CT CT ABD-PELV W/O
1 of 2 series · 14 of 32 positions shown, 19 images · non-contrast
Comparison: CT 12/26/2017

CLINICAL DATA: Enlarged lymph nodes, splenomegaly, sarcoidosis.

EXAM:
CT CHEST, ABDOMEN AND PELVIS WITHOUT CONTRAST
TECHNIQUE: Multidetector CT imaging of the chest, abdomen and pelvis was
performed following the standard protocol without IV contrast.

[Series 2: abd/pelvis w/(date) · axial · 0.87mm/px · z∈[-446,-36]mm · 14 of 92 slices shown, 19 images]
[im 5/92  soft-tissue]
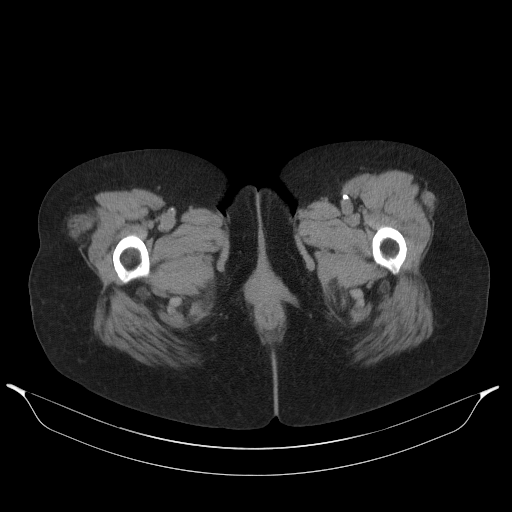
[im 5/92  bone]
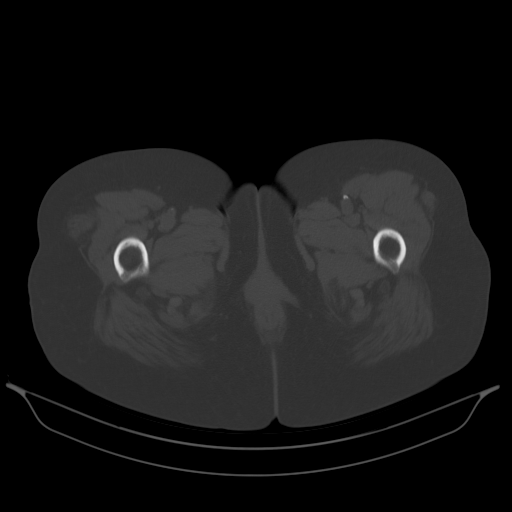
[im 15/92  soft-tissue]
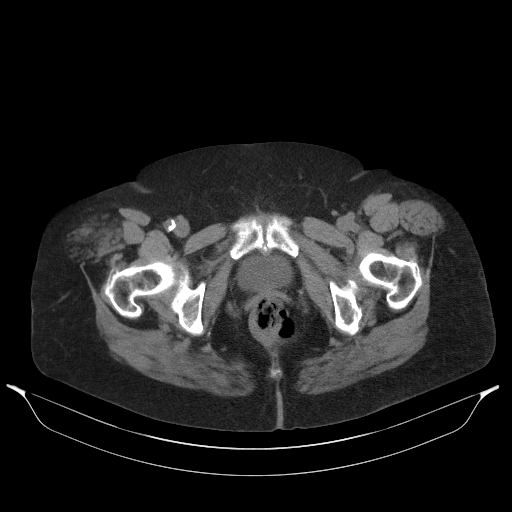
[im 20/92  soft-tissue]
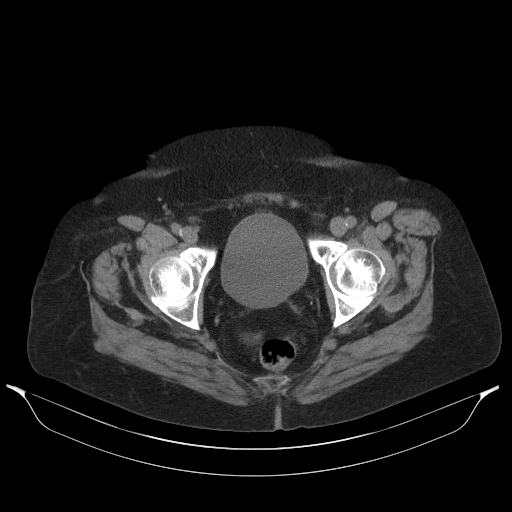
[im 24/92  soft-tissue]
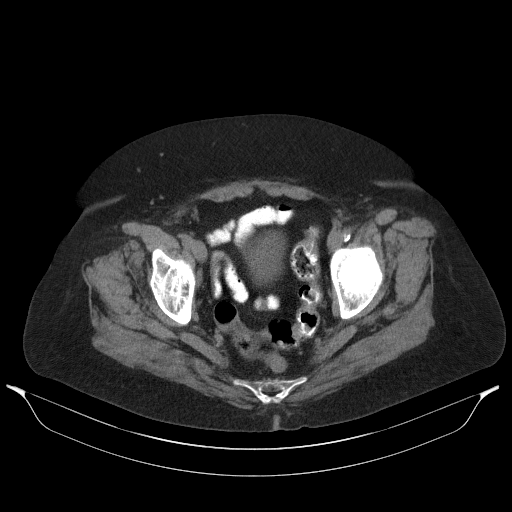
[im 34/92  soft-tissue]
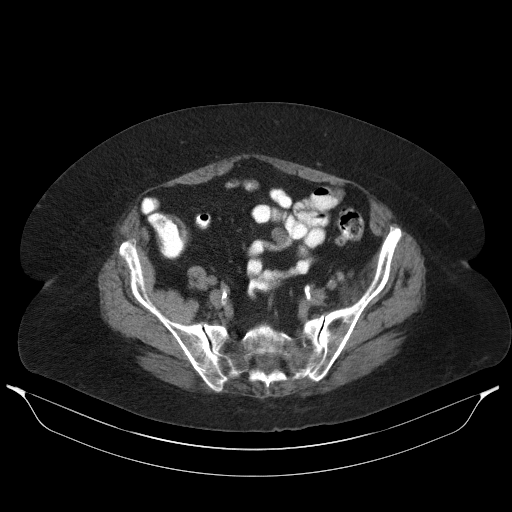
[im 39/92  soft-tissue]
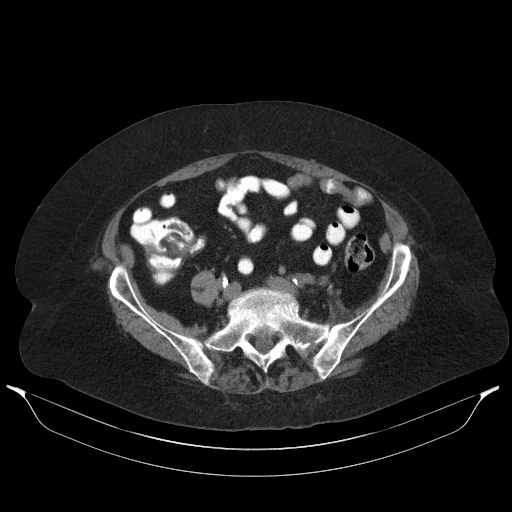
[im 48/92  soft-tissue]
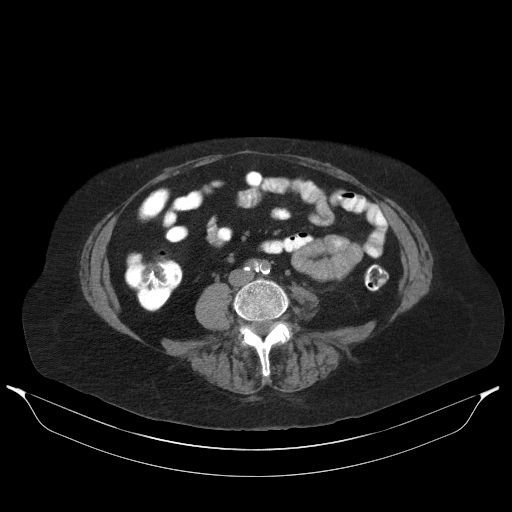
[im 53/92  soft-tissue]
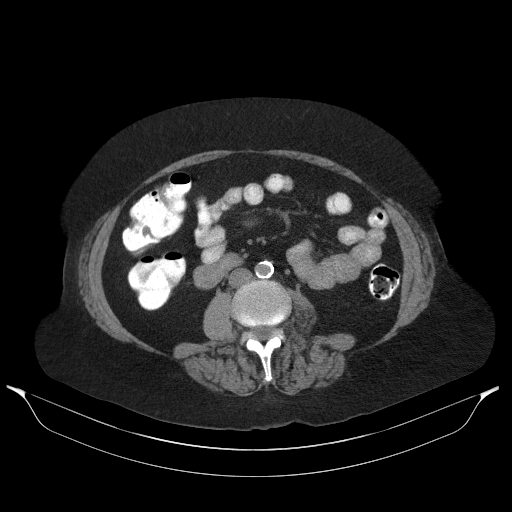
[im 58/92  soft-tissue]
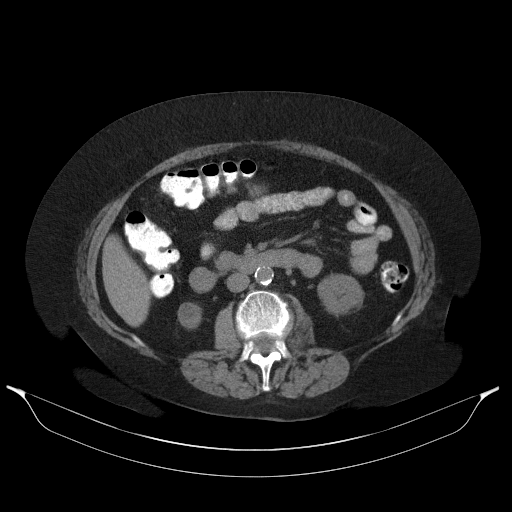
[im 58/92  bone]
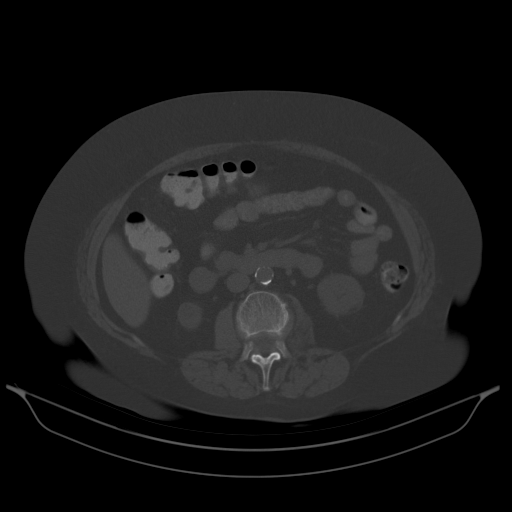
[im 68/92  soft-tissue]
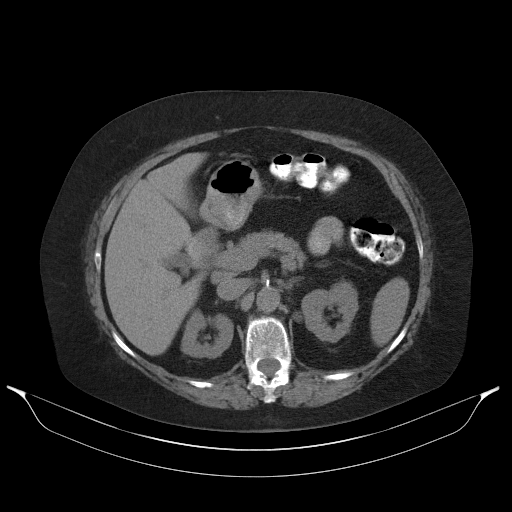
[im 72/92  soft-tissue]
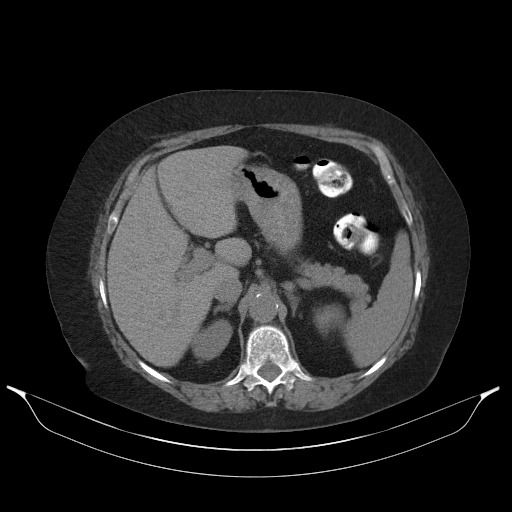
[im 72/92  lung]
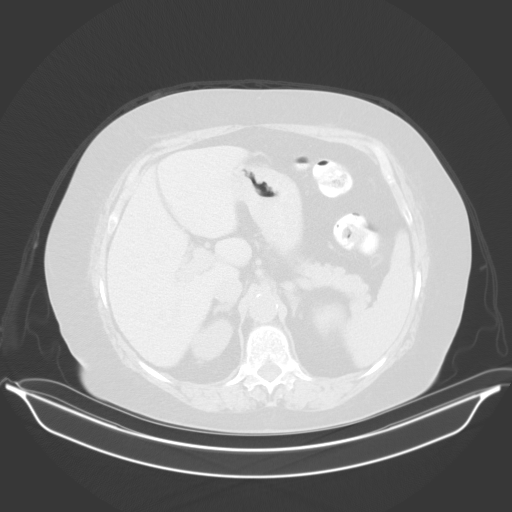
[im 77/92  soft-tissue]
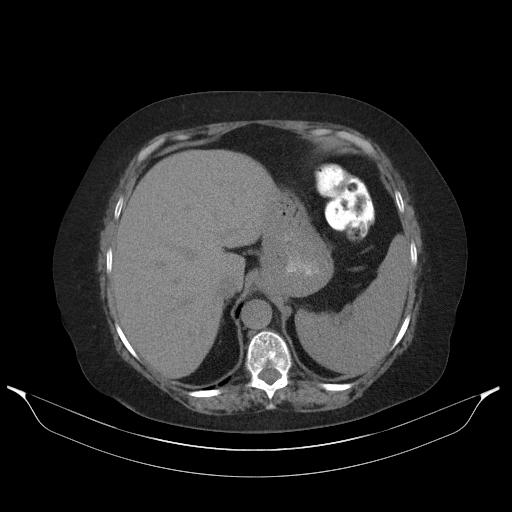
[im 77/92  lung]
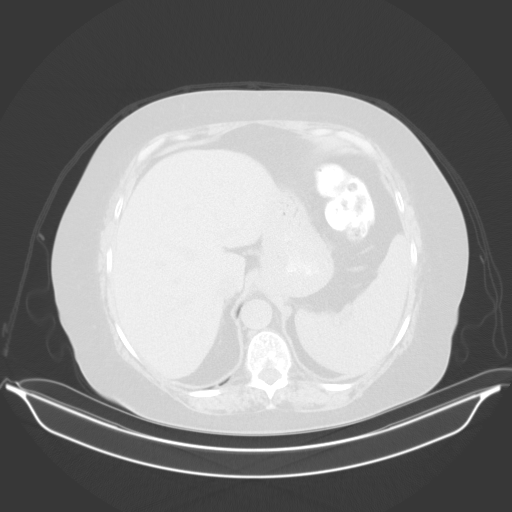
[im 82/92  lung]
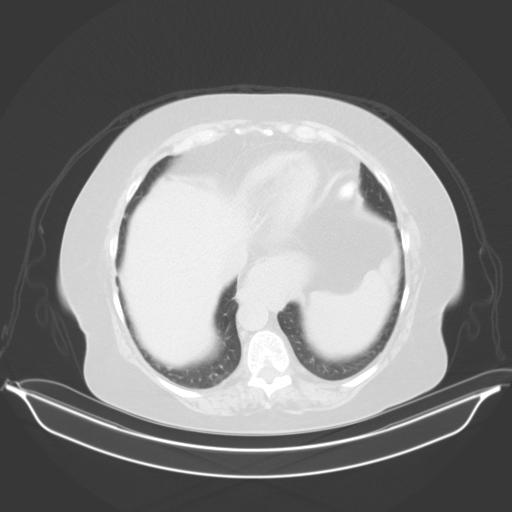
[im 87/92  soft-tissue]
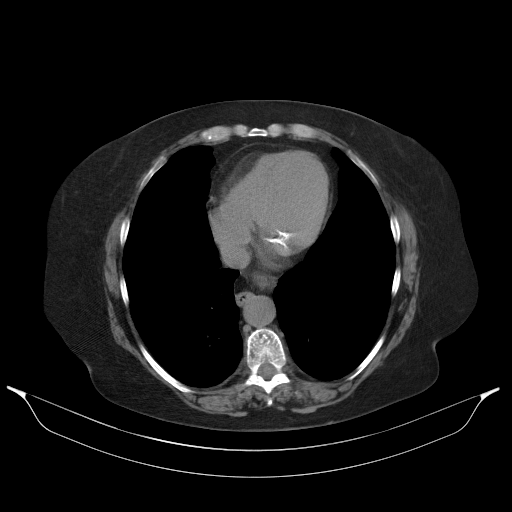
[im 87/92  lung]
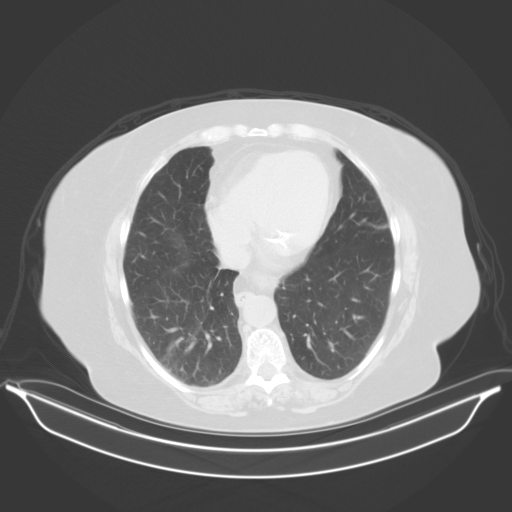

[14 of 32 positions shown; findings below may reference images not displayed]

FINDINGS: CT CHEST FINDINGS

Cardiovascular: Coronary artery calcification and aortic
atherosclerotic calcification.

Mediastinum/Nodes: No axillary or supraclavicular adenopathy.
Continued decreased in size of mediastinal lymph nodes compared to
CT 09/19/2017. Also there is a decrease in size from CT 12/26/2017.
Remaining lymph nodes are borderline enlarged measuring 9 to10 mm.
No hilar adenopathy.

Moderate hiatal hernia.

Lungs/Pleura: Mild scattered ground-glass densities. Improved
nodularity in the RIGHT upper lobe. No new pulmonary nodules.

Musculoskeletal: No aggressive osseous lesion.

CT ABDOMEN AND PELVIS FINDINGS

Hepatobiliary: Normal liver on noncontrast exam. Large gallstone
within noninflamed gallbladder. The duct dilatation. Common bile.

Pancreas: Pancreas is normal. No ductal dilatation. No pancreatic
inflammation.

Spleen: Spleen grossly normal on noncontrast exam.

Adrenals/urinary tract: Benign small adenoma of the LEFT adrenal
gland. Kidneys, ureters bladder normal.

Stomach/Bowel: Stomach, small bowel, appendix, and cecum are normal.
The colon and rectosigmoid colon are normal.

Vascular/Lymphatic: Abdominal aorta is normal caliber with
atherosclerotic calcification. There is no retroperitoneal or
periportal lymphadenopathy. No pelvic lymphadenopathy.

Reproductive: Post hysterectomy anatomy

Other: No free fluid.

Musculoskeletal: No aggressive osseous lesion.
IMPRESSION: Chest Impression:

1. Continue improvement in mediastinal adenopathy. Lymph nodes are
now not pathologic by size criteria.
2. Continued improvement in nodular and ground-glass densities
particularly in the RIGHT upper lobe. Findings are suggestive of
improved pulmonary sarcoidosis.

Abdomen / Pelvis Impression:

1. Spleen is normal.
2. No abdominopelvic lymphadenopathy.
3. Moderate hiatal hernia

## 2020-01-26 IMAGING — CT CT CHEST W/O CM
1 series · 15 of 34 positions shown, 19 images · non-contrast
Comparison: CT 12/26/2017

CLINICAL DATA: Enlarged lymph nodes, splenomegaly, sarcoidosis.

EXAM:
CT CHEST, ABDOMEN AND PELVIS WITHOUT CONTRAST
TECHNIQUE: Multidetector CT imaging of the chest, abdomen and pelvis was
performed following the standard protocol without IV contrast.

[Series 2: chest w/(date) · axial · 0.77mm/px · z∈[-278,-40]mm · 15 of 141 slices shown, 19 images]
[im 11/141  mediastinal]
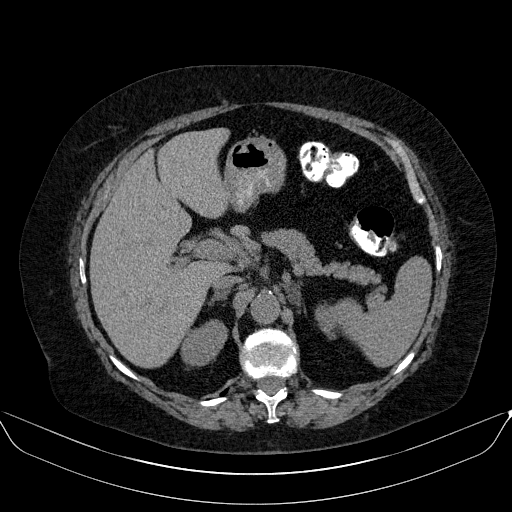
[im 11/141  lung]
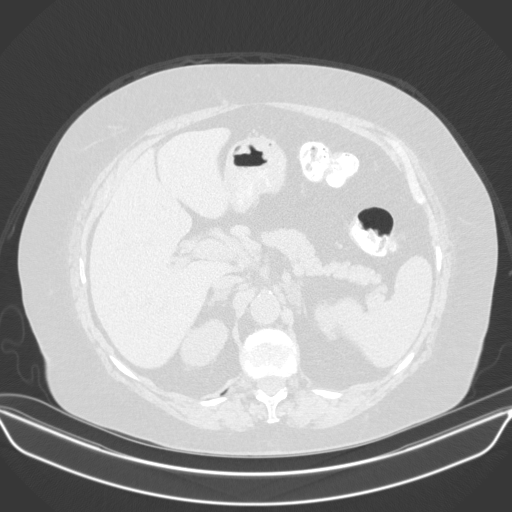
[im 21/141  lung]
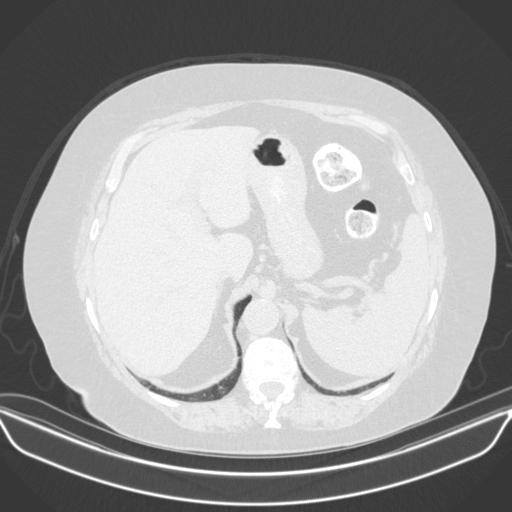
[im 29/141  lung]
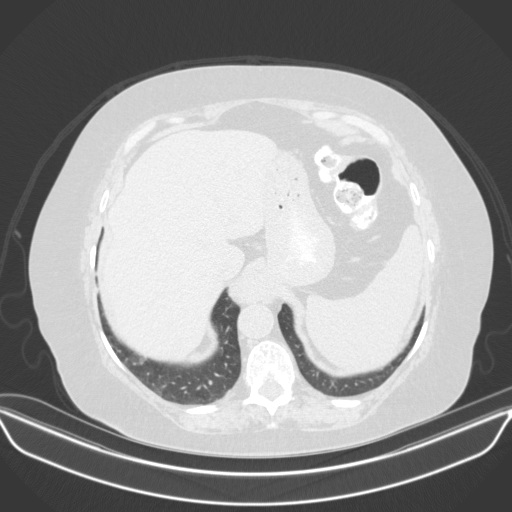
[im 37/141  lung]
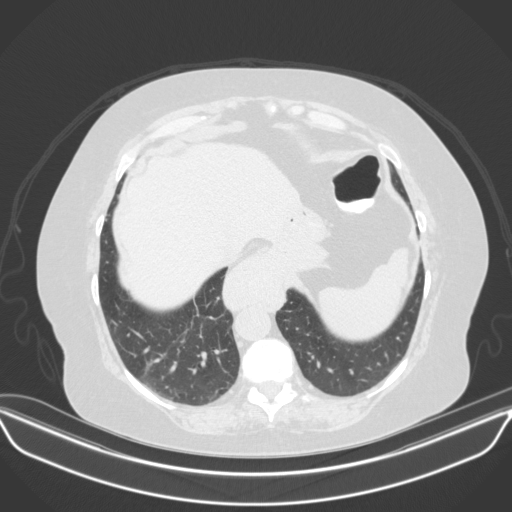
[im 47/141  mediastinal]
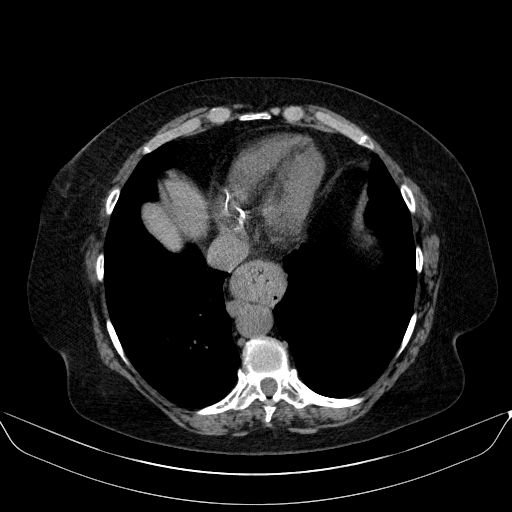
[im 47/141  lung]
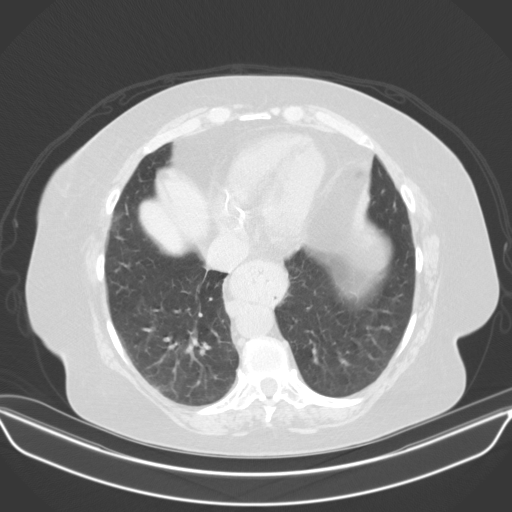
[im 57/141  lung]
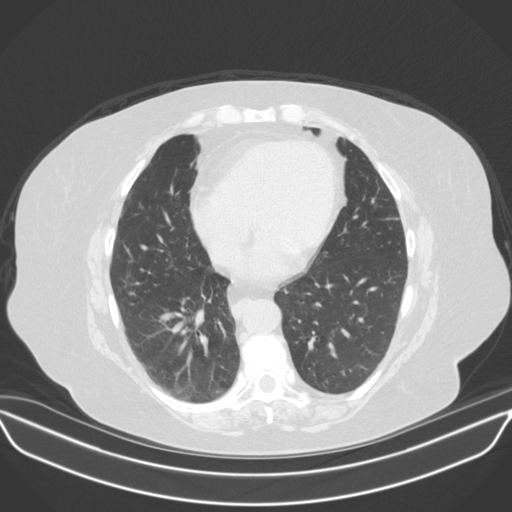
[im 63/141  lung]
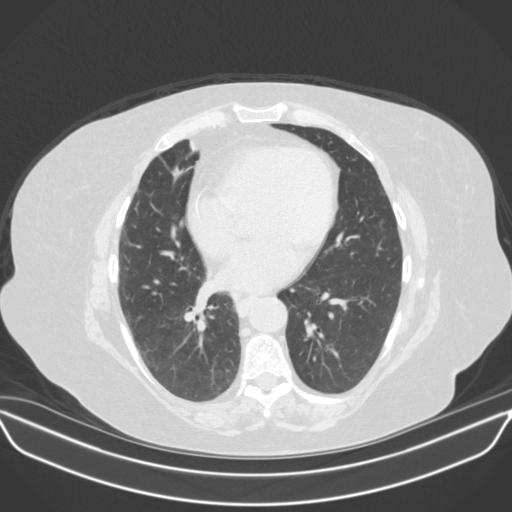
[im 73/141  lung]
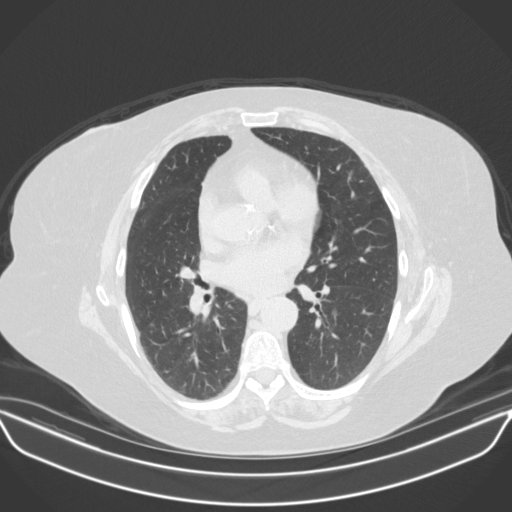
[im 78/141  mediastinal]
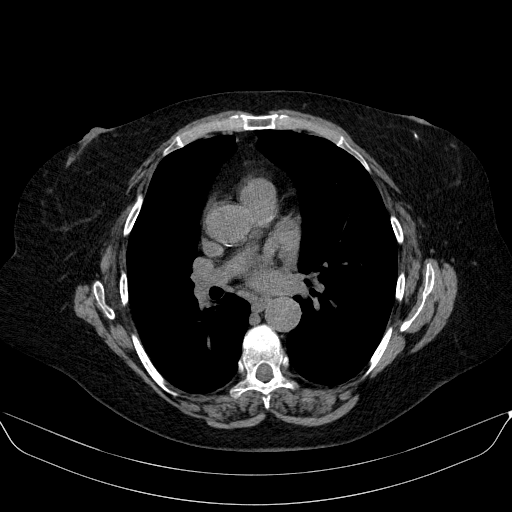
[im 78/141  lung]
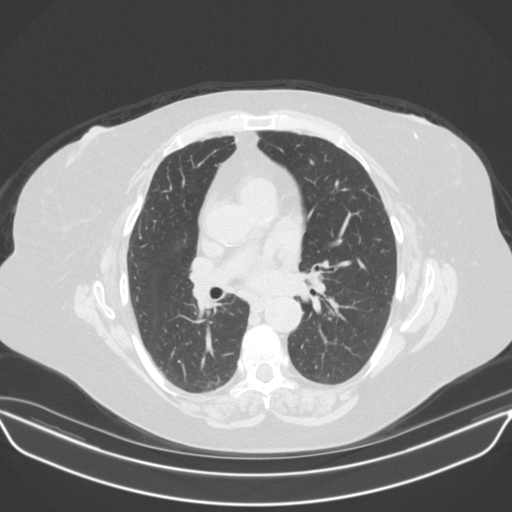
[im 85/141  lung]
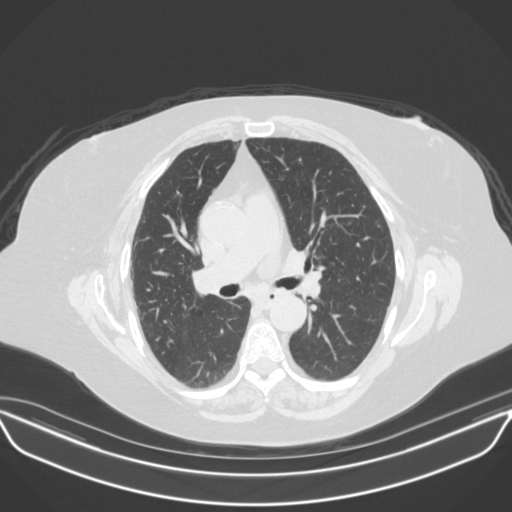
[im 94/141  lung]
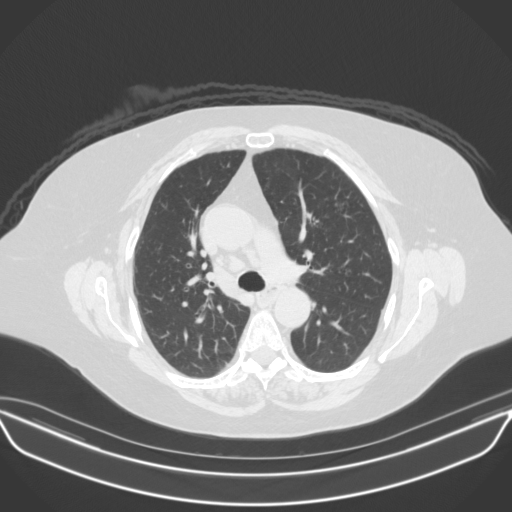
[im 104/141  lung]
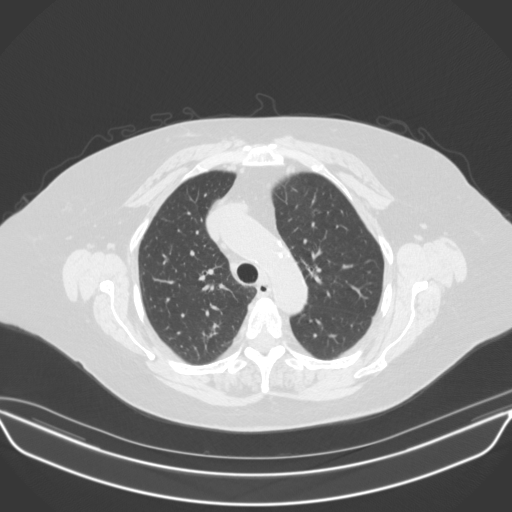
[im 113/141  mediastinal]
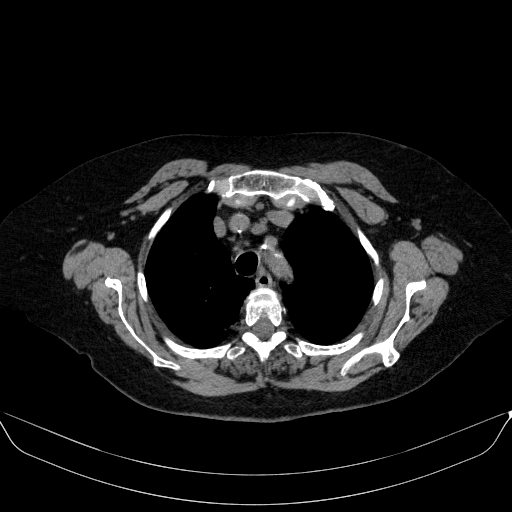
[im 113/141  lung]
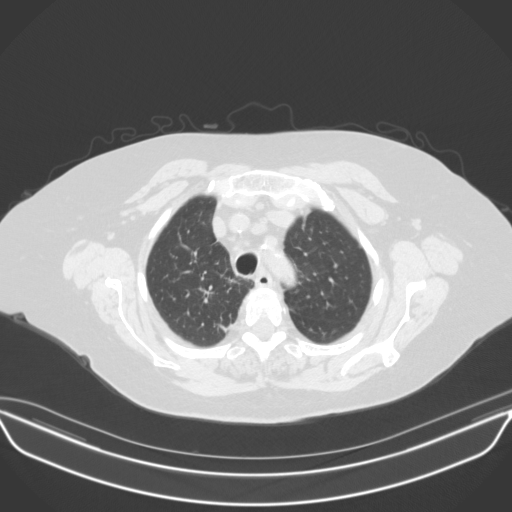
[im 120/141  lung]
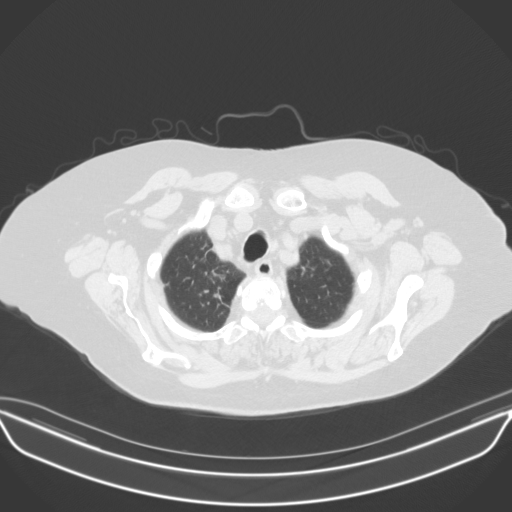
[im 130/141  lung]
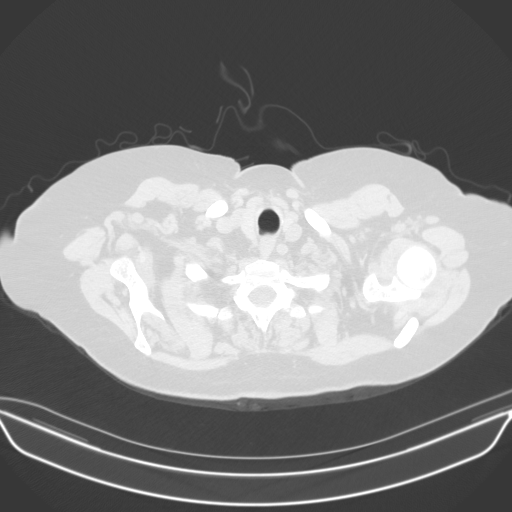

[15 of 34 positions shown; findings below may reference images not displayed]

FINDINGS: CT CHEST FINDINGS

Cardiovascular: Coronary artery calcification and aortic
atherosclerotic calcification.

Mediastinum/Nodes: No axillary or supraclavicular adenopathy.
Continued decreased in size of mediastinal lymph nodes compared to
CT 09/19/2017. Also there is a decrease in size from CT 12/26/2017.
Remaining lymph nodes are borderline enlarged measuring 9 to10 mm.
No hilar adenopathy.

Moderate hiatal hernia.

Lungs/Pleura: Mild scattered ground-glass densities. Improved
nodularity in the RIGHT upper lobe. No new pulmonary nodules.

Musculoskeletal: No aggressive osseous lesion.

CT ABDOMEN AND PELVIS FINDINGS

Hepatobiliary: Normal liver on noncontrast exam. Large gallstone
within noninflamed gallbladder. The duct dilatation. Common bile.

Pancreas: Pancreas is normal. No ductal dilatation. No pancreatic
inflammation.

Spleen: Spleen grossly normal on noncontrast exam.

Adrenals/urinary tract: Benign small adenoma of the LEFT adrenal
gland. Kidneys, ureters bladder normal.

Stomach/Bowel: Stomach, small bowel, appendix, and cecum are normal.
The colon and rectosigmoid colon are normal.

Vascular/Lymphatic: Abdominal aorta is normal caliber with
atherosclerotic calcification. There is no retroperitoneal or
periportal lymphadenopathy. No pelvic lymphadenopathy.

Reproductive: Post hysterectomy anatomy

Other: No free fluid.

Musculoskeletal: No aggressive osseous lesion.
IMPRESSION: Chest Impression:

1. Continue improvement in mediastinal adenopathy. Lymph nodes are
now not pathologic by size criteria.
2. Continued improvement in nodular and ground-glass densities
particularly in the RIGHT upper lobe. Findings are suggestive of
improved pulmonary sarcoidosis.

Abdomen / Pelvis Impression:

1. Spleen is normal.
2. No abdominopelvic lymphadenopathy.
3. Moderate hiatal hernia

## 2020-01-26 MED ORDER — POTASSIUM CHLORIDE CRYS ER 20 MEQ PO TBCR: 20.0000 meq | EXTENDED_RELEASE_TABLET | Freq: Every day | ORAL | 1 refills | Status: DC

## 2020-01-26 NOTE — Telephone Encounter (Signed)
Patient requested refill of potassium.  Patient changed pharmacy - [facility] uses [facility] on [address] Info changed in chart. Refill for potassium sent to [facility] as requested by patient.

## 2020-02-05 DIAGNOSIS — I4891 Unspecified atrial fibrillation: Secondary | ICD-10-CM | POA: Diagnosis not present

## 2020-02-05 DIAGNOSIS — E039 Hypothyroidism, unspecified: Secondary | ICD-10-CM | POA: Diagnosis not present

## 2020-02-05 DIAGNOSIS — R69 Illness, unspecified: Secondary | ICD-10-CM | POA: Diagnosis not present

## 2020-02-05 DIAGNOSIS — M1990 Unspecified osteoarthritis, unspecified site: Secondary | ICD-10-CM | POA: Diagnosis not present

## 2020-02-05 DIAGNOSIS — E7800 Pure hypercholesterolemia, unspecified: Secondary | ICD-10-CM | POA: Diagnosis not present

## 2020-02-05 DIAGNOSIS — N183 Chronic kidney disease, stage 3 unspecified: Secondary | ICD-10-CM | POA: Diagnosis not present

## 2020-02-18 ENCOUNTER — Other Ambulatory Visit: Payer: Self-pay | Admitting: *Deleted

## 2020-02-18 DIAGNOSIS — D473 Essential (hemorrhagic) thrombocythemia: Secondary | ICD-10-CM

## 2020-02-18 MED ORDER — HYDROXYUREA 500 MG PO CAPS: 500.0000 mg | ORAL_CAPSULE | Freq: Every day | ORAL | 1 refills | Status: DC

## 2020-02-18 NOTE — Telephone Encounter (Signed)
Patient's pharmacy called - patient requested refill of Hydroxyurea. Refilled per Dr.Kale OV note to continue med 11/19/19

## 2020-02-19 ENCOUNTER — Ambulatory Visit: Payer: Medicare HMO | Admitting: Hematology

## 2020-02-19 ENCOUNTER — Other Ambulatory Visit: Payer: Medicare HMO

## 2020-02-22 DIAGNOSIS — R69 Illness, unspecified: Secondary | ICD-10-CM | POA: Diagnosis not present

## 2020-02-26 ENCOUNTER — Telehealth: Payer: Self-pay | Admitting: Hematology

## 2020-02-26 ENCOUNTER — Inpatient Hospital Stay: Payer: Medicare HMO | Attending: Hematology

## 2020-02-26 ENCOUNTER — Other Ambulatory Visit: Payer: Self-pay

## 2020-02-26 ENCOUNTER — Inpatient Hospital Stay: Payer: Medicare HMO | Admitting: Hematology

## 2020-02-26 VITALS — BP 139/84 | HR 100 | Temp 97.6°F | Resp 19 | Ht 61.0 in | Wt 152.5 lb

## 2020-02-26 DIAGNOSIS — Z87891 Personal history of nicotine dependence: Secondary | ICD-10-CM | POA: Diagnosis not present

## 2020-02-26 DIAGNOSIS — D473 Essential (hemorrhagic) thrombocythemia: Secondary | ICD-10-CM

## 2020-02-26 DIAGNOSIS — E611 Iron deficiency: Secondary | ICD-10-CM | POA: Diagnosis not present

## 2020-02-26 DIAGNOSIS — I700 Atherosclerosis of aorta: Secondary | ICD-10-CM | POA: Diagnosis not present

## 2020-02-26 DIAGNOSIS — D863 Sarcoidosis of skin: Secondary | ICD-10-CM | POA: Insufficient documentation

## 2020-02-26 DIAGNOSIS — Z79899 Other long term (current) drug therapy: Secondary | ICD-10-CM | POA: Insufficient documentation

## 2020-02-26 DIAGNOSIS — C946 Myelodysplastic disease, not classified: Secondary | ICD-10-CM | POA: Insufficient documentation

## 2020-02-26 DIAGNOSIS — R591 Generalized enlarged lymph nodes: Secondary | ICD-10-CM | POA: Insufficient documentation

## 2020-02-26 LAB — CMP (CANCER CENTER ONLY)
ALT: 10 U/L (ref 0–44)
AST: 16 U/L (ref 15–41)
Albumin: 4.2 g/dL (ref 3.5–5.0)
Alkaline Phosphatase: 114 U/L (ref 38–126)
Anion gap: 13 (ref 5–15)
BUN: 16 mg/dL (ref 8–23)
CO2: 23 mmol/L (ref 22–32)
Calcium: 10.1 mg/dL (ref 8.9–10.3)
Chloride: 102 mmol/L (ref 98–111)
Creatinine: 1.88 mg/dL — ABNORMAL HIGH (ref 0.44–1.00)
GFR, Est AFR Am: 30 mL/min — ABNORMAL LOW (ref >60)
GFR, Estimated: 26 mL/min — ABNORMAL LOW (ref >60)
Glucose, Bld: 107 mg/dL — ABNORMAL HIGH (ref 70–99)
Potassium: 3.8 mmol/L (ref 3.5–5.1)
Sodium: 138 mmol/L (ref 135–145)
Total Bilirubin: 0.6 mg/dL (ref 0.3–1.2)
Total Protein: 7.5 g/dL (ref 6.5–8.1)

## 2020-02-26 LAB — CBC WITH DIFFERENTIAL/PLATELET
Abs Immature Granulocytes: 0.04 10*3/uL (ref 0.00–0.07)
Basophils Absolute: 0.1 10*3/uL (ref 0.0–0.1)
Basophils Relative: 1 %
Eosinophils Absolute: 0.1 10*3/uL (ref 0.0–0.5)
Eosinophils Relative: 1 %
HCT: 34.5 % — ABNORMAL LOW (ref 36.0–46.0)
Hemoglobin: 11.1 g/dL — ABNORMAL LOW (ref 12.0–15.0)
Immature Granulocytes: 0 %
Lymphocytes Relative: 15 %
Lymphs Abs: 1.7 10*3/uL (ref 0.7–4.0)
MCH: 31.4 pg (ref 26.0–34.0)
MCHC: 32.2 g/dL (ref 30.0–36.0)
MCV: 97.7 fL (ref 80.0–100.0)
Monocytes Absolute: 1 10*3/uL (ref 0.1–1.0)
Monocytes Relative: 9 %
Neutro Abs: 8.3 10*3/uL — ABNORMAL HIGH (ref 1.7–7.7)
Neutrophils Relative %: 74 %
Platelets: 374 10*3/uL (ref 150–400)
RBC: 3.53 MIL/uL — ABNORMAL LOW (ref 3.87–5.11)
RDW: 14.7 % (ref 11.5–15.5)
WBC: 11.1 10*3/uL — ABNORMAL HIGH (ref 4.0–10.5)
nRBC: 0 % (ref 0.0–0.2)

## 2020-02-26 LAB — FERRITIN: Ferritin: 13 ng/mL (ref 11–307)

## 2020-02-26 NOTE — Progress Notes (Signed)
HEMATOLOGY/ONCOLOGY CLINIC NOTE  Date of Service: 02/26/20    Patient Care Team: Lawerance Cruel, MD as PCP - General (Family Medicine) [name], MD as PCP - Cardiology (Cardiology)  CHIEF COMPLAINTS/PURPOSE OF CONSULTATION:  F/u for mx of Jak2 MPN  HISTORY OF PRESENTING ILLNESS:   Alexis Murphy is a wonderful 74 y.o. 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 The pt reports that she is doing well overall. She has seen dermatology and rheumatology with work up of her sarcoidosis. She is seeing Dr Christinia Gully in one month in Pulmonology.   She notes that her skin changes first appeared 10 years ago.   The pt reports that her recent nose bx with [facility] a week ago revealed sarcoidosis and her right foot bx revealed stasis dermatitis. She has been diagnosed with sarcoidosis before this. She began a 20mg  prednisone 67-month-taper, three weeks ago with Dr. [name], and is also taking Plaquenil for her sarcoidosis. She had a CT on 09/19/17 to properly work up her sarcoidosis diagnosis.   She notes that prior to taking steroids her feet were very painful and also had some neuropathy. She also had night sweats and leg and ankle swelling but did not have fatigue or shortness of breath. These positive symptoms have resolved after beginning Prednisone.   She denies any breast symptoms. She has a MMG on 08/05/2017 which showed : IMPRESSION: New suspicious left breast mass 7:00 position retroareolar measuring 0.6 cm on ultrasound. Malignancy cannot be excluded. The mass does have similar imaging appearance to the previously biopsied left breast mass (January 2017, which demonstrated granulomatous inflammation and no evidence of malignancy at pathology.) The possibility of a second area of granulomatous inflammation is considered.  Borderline diffuse cortical thickening of a left axillary  lymph node. On review of the patient's prior CT a of the chest, patient does have mild diffuse lymphadenopathy in the thorax. At this time, axillary lymph node biopsy is not felt warranted.  The patient was given a copy of her pathology report from her left breast biopsy from January 2017. I told her she may want to share this information with her rheumatologist.  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   Of note prior to the patient's visit today, pt has had CT Chest completed on 09/19/17 with results revealing 1. Moderate mediastinal and right supraclavicular lymphadenopathy, increased. New mild bilateral axillary and bilateral hilar lymphadenopathy. Findings are worrisome for a malignant process such as a lymphoproliferative disorder or metastatic nodal disease. The right supraclavicular node may be (but not definitely) accessible for percutaneous biopsy. 2. Numerous similar-appearing subsolid spiculated pulmonary nodules scattered throughout both lungs, upper lobe predominant, largest 1.6 cm, stable to mildly increased in size and slightly increased in density in the interval. These are indeterminate, with multifocal lung adenocarcinoma on the differential. 3. Several new subcentimeter hypodense splenic lesions. Solitary small hypodense posterior right liver lobe lesion. These lesions are indeterminate, with lymphoproliferative disorder or metastatic disease not excluded. Options include further characterization with MRI abdomen without and with IV contrast or short-term follow-up CT abdomen with IV contrast in 3 months. 4. Chronic findings include: Left main and 3 vessel coronary atherosclerosis. Small hiatal hernia. Cholelithiasis.   Most recent lab results (03/10/17) of CBC  is as follows: all values are WNL except for WBC at 17.5k, MCV at 75.8, MCH at 25.2, RDW at 17.6,  Platelets at 804k.  On review of systems, pt reports dry mouth, decreased leg swelling, and  denies tingling, numbness in her hands or feet, dry eyes, problems breathing, cough, SOB, CP, nose bleeds, fatigue, abdominal pains, unexpected weight loss, fevers, chills, night sweats.   On PMHx the pt reports sarcoidosis, Bell's palsy twice.  On Social Hx the pt reports that she smoked cigarettes for 45 years. She quit smoking cigarettes 10 years ago but continues to smoke marijuana occasionally.  On Family Hx the pt reports maternal lupus.  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 The patient's last visit with [place] was on 11/19/2019. The pt reports that she is doing well overall.  The pt reports that she is currently taking 5 mg & 2.5 mg Prednisone on alternating days. She has been taken off of Methotrexate and Folic acid. Pt has continued taking Potassium and 500 mg Hydroxyurea daily. Pt denies any issues with Hydroxyurea.  Lab results today (02/26/20) of CBC w/diff and CMP is as follows: all values are WNL except for WBC at 11.1K, RBC at 3.53, Hgb at 11.1, HCT at 34.5, Neutro Abs at 8.3K, Glucose at 107, Creatinine at 1.88, GFR Est Non Af Am at 26. 02/26/2020 Ferritin at 13  On review of systems, pt denies new lumps/bumps, fatigue and any other symptoms.   MEDICAL HISTORY:  Past Medical History:  Diagnosis Date  . Arthritis   . Hyperlipidemia   . Sarcoidosis     SURGICAL HISTORY: Past Surgical History:  Procedure Laterality Date  . ABDOMINAL HYSTERECTOMY    . BREAST BIOPSY    . BREAST SURGERY    . REDUCTION MAMMAPLASTY      SOCIAL HISTORY: Social History   Socioeconomic History  . Marital status: Widowed    Spouse name: Not on file  . Number of children: Not on file  . Years of education: Not on file  . Highest education level: Not on file  Occupational History  . Not on file  Tobacco Use  . Smoking status: Former Smoker    Packs/day: 1.00    Years: 45.00    Pack years: 45.00    Types: Cigarettes    Quit date:  10/15/2007    Years since quitting: 12.3  . Smokeless tobacco: Never Used  Vaping Use  . Vaping Use: Never used  Substance and Sexual Activity  . Alcohol use: Yes  . Drug use: No  . Sexual activity: Not on file  Other Topics Concern  . Not on file  Social History Narrative  . Not on file   Social Determinants of Health   Financial Resource Strain:   . Difficulty of Paying Living Expenses: Not on file  Food Insecurity:   . Worried About Charity fundraiser in the Last Year: Not on file  . Ran Out of Food in the Last Year: Not on file  Transportation Needs:   . Lack of Transportation (Medical): Not on file  . Lack of Transportation (Non-Medical): Not on file  Physical Activity:   . Days of Exercise per Week: Not on file  . Minutes of Exercise per Session: Not on file  Stress:   . Feeling of Stress : Not on file  Social Connections:   . Frequency of Communication with Friends and Family: Not on file  . Frequency of Social Gatherings with Friends and Family: Not on file  . Attends Religious Services: Not on file  .  Active Member of Clubs or Organizations: Not on file  . Attends Archivist Meetings: Not on file  . Marital Status: Not on file  Intimate Partner Violence:   . Fear of Current or Ex-Partner: Not on file  . Emotionally Abused: Not on file  . Physically Abused: Not on file  . Sexually Abused: Not on file    FAMILY HISTORY: Family History  Problem Relation Age of Onset  . Heart disease Father   . Heart disease Brother     ALLERGIES:  is allergic to penicillins and codeine.  MEDICATIONS:  Current Outpatient Medications  Medication Sig Dispense Refill  . acetaminophen (TYLENOL) 325 MG tablet Take 1 tablet (325 mg total) by mouth every 6 (six) hours. (Patient taking differently: Take 325 mg by mouth every 6 (six) hours as needed for headache. ) 30 tablet 0  . amLODipine (NORVASC) 5 MG tablet Take 5 mg by mouth every evening.     Marland Kitchen aspirin EC 81 MG  tablet Take 81 mg by mouth daily.    Marland Kitchen atorvastatin (LIPITOR) 40 MG tablet Take 40 mg by mouth every evening.     . folic acid (FOLVITE) 1 MG tablet Take 1 mg by mouth daily.    . hydroxyurea (HYDREA) 500 MG capsule Take 1 capsule (500 mg total) by mouth daily. May take with food to minimize GI side effects. 30 capsule 1  . levothyroxine (SYNTHROID) 50 MCG tablet Take 50 mcg by mouth every morning.    . Melatonin 5 MG TABS Take 5 mg by mouth at bedtime.    Marland Kitchen omeprazole (PRILOSEC) 40 MG capsule Take 40 mg by mouth every evening.     . potassium chloride SA (KLOR-CON M20) 20 MEQ tablet Take 1 tablet (20 mEq total) by mouth daily. 30 tablet 1  . predniSONE (DELTASONE) 5 MG tablet Take 5 mg by mouth daily.    Marland Kitchen venlafaxine XR (EFFEXOR-XR) 150 MG 24 hr capsule Take 150 mg by mouth every evening.      No current facility-administered medications for this visit.    REVIEW OF SYSTEMS:   A 10+ POINT REVIEW OF SYSTEMS WAS OBTAINED including neurology, dermatology, psychiatry, cardiac, respiratory, lymph, extremities, GI, GU, Musculoskeletal, constitutional, breasts, reproductive, HEENT.  All pertinent positives are noted in the HPI.  All others are negative.   PHYSICAL EXAMINATION: ECOG FS:2 - Symptomatic, <50% confined to bed  Vitals:   02/26/20 1059  BP: 139/84  Pulse: 100  Resp: 19  Temp: 97.6 F (36.4 C)  SpO2: 100%   Wt Readings from Last 3 Encounters:  02/26/20 152 lb 8 oz (69.2 kg)  11/19/19 160 lb 4.8 oz (72.7 kg)  09/17/19 162 lb 8 oz (73.7 kg)   Body mass index is 28.81 kg/m.    GENERAL:alert, in no acute distress and comfortable SKIN: no acute rashes, no significant lesions EYES: conjunctiva are pink and non-injected, sclera anicteric OROPHARYNX: MMM, no exudates, no oropharyngeal erythema or ulceration NECK: supple, no JVD LYMPH:  no palpable lymphadenopathy in the cervical, axillary or inguinal regions LUNGS: clear to auscultation b/l with normal respiratory  effort HEART: regular rate & rhythm ABDOMEN:  normoactive bowel sounds , non tender, not distended. No palpable hepatosplenomegaly.  Extremity: no pedal edema PSYCH: alert & oriented x 3 with fluent speech NEURO: no focal motor/sensory deficits  LABORATORY DATA:  I have reviewed the data as listed  . CBC Latest Ref Rng & Units 02/26/2020 11/19/2019 09/17/2019  WBC 4.0 -  10.5 K/uL 11.1(H) 8.4 8.4  Hemoglobin 12.0 - 15.0 g/dL 11.1(L) 10.5(L) 10.1(L)  Hematocrit 36 - 46 % 34.5(L) 32.7(L) 32.5(L)  Platelets 150 - 400 K/uL 374 413(H) 349    . CMP Latest Ref Rng & Units 02/26/2020 11/19/2019 09/17/2019  Glucose 70 - 99 mg/dL 107(H) 135(H) 84  BUN 8 - 23 mg/dL 16 21 25(H)  Creatinine 0.44 - 1.00 mg/dL 1.88(H) 1.65(H) 1.61(H)  Sodium 135 - 145 mmol/L 138 139 140  Potassium 3.5 - 5.1 mmol/L 3.8 3.1(L) 4.4  Chloride 98 - 111 mmol/L 102 101 104  CO2 22 - 32 mmol/L 23 22 25   Calcium 8.9 - 10.3 mg/dL 10.1 9.1 8.8(L)  Total Protein 6.5 - 8.1 g/dL 7.5 7.2 7.2  Total Bilirubin 0.3 - 1.2 mg/dL 0.6 0.4 0.4  Alkaline Phos 38 - 126 U/L 114 100 117  AST 15 - 41 U/L 16 16 11(L)  ALT 0 - 44 U/L 10 12 10            RADIOGRAPHIC STUDIES: I have personally reviewed the radiological images as listed and agreed with the findings in the report. No results found. CT chest 09/19/2017: IMPRESSION: 1. Moderate mediastinal and right supraclavicular lymphadenopathy, increased. New mild bilateral axillary and bilateral hilar lymphadenopathy. Findings are worrisome for a malignant process such as a lymphoproliferative disorder or metastatic nodal disease. The right supraclavicular node may be (but not definitely) accessible for percutaneous biopsy. 2. Numerous similar-appearing subsolid spiculated pulmonary nodules scattered throughout both lungs, upper lobe predominant, largest 1.6 cm, stable to mildly increased in size and slightly increased in density in the interval. These are indeterminate, with  multifocal lung adenocarcinoma on the differential. 3. Several new subcentimeter hypodense splenic lesions. Solitary small hypodense posterior right liver lobe lesion. These lesions are indeterminate, with lymphoproliferative disorder or metastatic disease not excluded. Options include further characterization with MRI abdomen without and with IV contrast or short-term follow-up CT abdomen with IV contrast in 3 months. 4. Chronic findings include: Left main and 3 vessel coronary atherosclerosis. Small hiatal hernia. Cholelithiasis.  Aortic Atherosclerosis (ICD10-I70.0).  Electronically Signed   By: Ilona Sorrel M.D.   On: 09/19/2017 11:54   ASSESSMENT & PLAN:   74 y.o. female with  1. Generalized lymphadenopathy with multiple pulmonary nodules CT Chest 09/19/2017: Moderate mediastinal and right supraclavicular lymphadenopathy, increased. New mild bilateral axillary and bilateral hilar lymphadenopathy. Findings are worrisome for a malignant process such as a lymphoproliferative disorder or metastatic nodal disease. The right supraclavicular node may be (but not definitely) accessible for percutaneous biopsy. 2. Numerous similar-appearing subsolid spiculated pulmonary nodules scattered throughout both lungs, upper lobe predominant, largest 1.6 cm, stable to mildly increased in size and slightly increased in density in the interval. These are indeterminate, with multifocal lung adenocarcinoma on the differential.  12/26/17 CT C/A/P revealed interval response to steroids in LNadenopathy   07/17/18 CT C/A/P revealed Continue improvement in mediastinal adenopathy. Lymph nodes are now not pathologic by size criteria. 2. Continued improvement in nodular and ground-glass densities particularly in the RIGHT upper lobe. 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  2. Breast - lesion - Bx -- granulomatous inflammation -consistent with sarcoidosis.  3. Skin rash on nose - Bx  consistent with sarcoidosis.  4. Iron deficiency - no significant anemia . Lab Results  Component Value Date   IRON 123 11/03/2018   TIBC 354 11/03/2018   IRONPCTSAT 35 11/03/2018   (Iron and TIBC)  Lab Results  Component Value Date  FERRITIN 13 02/26/2020   PLAN - GI w/u - will defer to PCP -?GI involvement by sarcoidosis.  5. 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  -Advised pt that Prednisone can drop her Potassium counts. As she tapers her Prednisone dose she may not need as much Potassium.  -Advised pt that it's okay to hold Potassium supplement at this time and focus on eating potassium rich foods. Will recheck with next labs.  -Recommended that the pt continue to eat well, drink at least 48-64 oz of water each day, and walk 20-30 minutes each day.  -Discussed the CDC guidelines for the COVID19 vaccine booster. Advised pt of her immunosuppressed state.  -Discussed again skin sensitization caused by Hydroxyurea. Recommend sun protection strategies.  -Not unreasonable for pt to continue Folic acid.  -Continue Asprin -Will see back in 3 months with labs   FOLLOW UP: -Covid booster dose today -RTC with Dr Irene Limbo with labs in 3 months   The total time spent in the appt was 20 minutes and more than 50% was on counseling and direct patient cares.  All of the patient's questions were answered with apparent satisfaction. The patient knows to call the clinic with any problems, questions or  concerns.   Sullivan Lone MD [place] [facility] Hematology/Oncology Physician [facility]  (Office):       [phone] (Work cell):  [phone] (Fax):           [phone]  02/26/2020 12:09 PM  I, Yevette Edwards, am acting as a scribe for Dr. Sullivan Lone.   .I have reviewed the above documentation for accuracy and completeness, and I agree with the above. Brunetta Genera MD

## 2020-02-26 NOTE — Telephone Encounter (Signed)
Pt stated she will give office a call to schedule covid booster appt.

## 2020-02-29 DIAGNOSIS — M1990 Unspecified osteoarthritis, unspecified site: Secondary | ICD-10-CM | POA: Diagnosis not present

## 2020-02-29 DIAGNOSIS — I4891 Unspecified atrial fibrillation: Secondary | ICD-10-CM | POA: Diagnosis not present

## 2020-02-29 DIAGNOSIS — N183 Chronic kidney disease, stage 3 unspecified: Secondary | ICD-10-CM | POA: Diagnosis not present

## 2020-02-29 DIAGNOSIS — E7800 Pure hypercholesterolemia, unspecified: Secondary | ICD-10-CM | POA: Diagnosis not present

## 2020-02-29 DIAGNOSIS — R69 Illness, unspecified: Secondary | ICD-10-CM | POA: Diagnosis not present

## 2020-02-29 DIAGNOSIS — E039 Hypothyroidism, unspecified: Secondary | ICD-10-CM | POA: Diagnosis not present

## 2020-03-01 ENCOUNTER — Telehealth: Payer: Self-pay | Admitting: Hematology

## 2020-03-01 NOTE — Telephone Encounter (Signed)
Scheduled per los. Called and spoke with patient. Confirmed appt 

## 2020-03-02 ENCOUNTER — Other Ambulatory Visit: Payer: Self-pay

## 2020-03-02 ENCOUNTER — Inpatient Hospital Stay: Payer: Medicare HMO

## 2020-03-02 ENCOUNTER — Telehealth: Payer: Self-pay | Admitting: Hematology

## 2020-03-02 NOTE — Telephone Encounter (Signed)
Cancelled covid booster appt. Patient needs to find vaccination card. Will call back or walk in to reschedule once its found

## 2020-03-03 ENCOUNTER — Other Ambulatory Visit: Payer: Self-pay | Admitting: Hematology

## 2020-03-03 DIAGNOSIS — D473 Essential (hemorrhagic) thrombocythemia: Secondary | ICD-10-CM

## 2020-03-03 DIAGNOSIS — E876 Hypokalemia: Secondary | ICD-10-CM

## 2020-03-15 DIAGNOSIS — I7300 Raynaud's syndrome without gangrene: Secondary | ICD-10-CM | POA: Diagnosis not present

## 2020-03-15 DIAGNOSIS — D869 Sarcoidosis, unspecified: Secondary | ICD-10-CM | POA: Diagnosis not present

## 2020-03-15 DIAGNOSIS — N1832 Chronic kidney disease, stage 3b: Secondary | ICD-10-CM | POA: Diagnosis not present

## 2020-03-15 DIAGNOSIS — L52 Erythema nodosum: Secondary | ICD-10-CM | POA: Diagnosis not present

## 2020-03-15 DIAGNOSIS — R9389 Abnormal findings on diagnostic imaging of other specified body structures: Secondary | ICD-10-CM | POA: Diagnosis not present

## 2020-03-15 DIAGNOSIS — R69 Illness, unspecified: Secondary | ICD-10-CM | POA: Diagnosis not present

## 2020-03-15 DIAGNOSIS — Z1589 Genetic susceptibility to other disease: Secondary | ICD-10-CM | POA: Diagnosis not present

## 2020-03-15 DIAGNOSIS — D473 Essential (hemorrhagic) thrombocythemia: Secondary | ICD-10-CM | POA: Diagnosis not present

## 2020-03-15 DIAGNOSIS — H348112 Central retinal vein occlusion, right eye, stable: Secondary | ICD-10-CM | POA: Diagnosis not present

## 2020-03-15 DIAGNOSIS — Z7952 Long term (current) use of systemic steroids: Secondary | ICD-10-CM | POA: Diagnosis not present

## 2020-03-18 ENCOUNTER — Other Ambulatory Visit: Payer: Self-pay | Admitting: Physician Assistant

## 2020-03-18 DIAGNOSIS — Z7952 Long term (current) use of systemic steroids: Secondary | ICD-10-CM

## 2020-03-22 ENCOUNTER — Other Ambulatory Visit: Payer: Self-pay | Admitting: Physician Assistant

## 2020-03-22 DIAGNOSIS — Z1231 Encounter for screening mammogram for malignant neoplasm of breast: Secondary | ICD-10-CM

## 2020-03-23 DIAGNOSIS — I4891 Unspecified atrial fibrillation: Secondary | ICD-10-CM | POA: Diagnosis not present

## 2020-03-23 DIAGNOSIS — M179 Osteoarthritis of knee, unspecified: Secondary | ICD-10-CM | POA: Diagnosis not present

## 2020-03-23 DIAGNOSIS — R69 Illness, unspecified: Secondary | ICD-10-CM | POA: Diagnosis not present

## 2020-03-23 DIAGNOSIS — E7800 Pure hypercholesterolemia, unspecified: Secondary | ICD-10-CM | POA: Diagnosis not present

## 2020-03-23 DIAGNOSIS — E039 Hypothyroidism, unspecified: Secondary | ICD-10-CM | POA: Diagnosis not present

## 2020-03-23 DIAGNOSIS — M1990 Unspecified osteoarthritis, unspecified site: Secondary | ICD-10-CM | POA: Diagnosis not present

## 2020-03-23 DIAGNOSIS — N184 Chronic kidney disease, stage 4 (severe): Secondary | ICD-10-CM | POA: Diagnosis not present

## 2020-03-23 DIAGNOSIS — N183 Chronic kidney disease, stage 3 unspecified: Secondary | ICD-10-CM | POA: Diagnosis not present

## 2020-03-24 ENCOUNTER — Other Ambulatory Visit: Payer: Medicare HMO

## 2020-03-30 DIAGNOSIS — M25462 Effusion, left knee: Secondary | ICD-10-CM | POA: Diagnosis not present

## 2020-03-30 DIAGNOSIS — H348112 Central retinal vein occlusion, right eye, stable: Secondary | ICD-10-CM | POA: Diagnosis not present

## 2020-03-30 DIAGNOSIS — R69 Illness, unspecified: Secondary | ICD-10-CM | POA: Diagnosis not present

## 2020-03-30 DIAGNOSIS — D473 Essential (hemorrhagic) thrombocythemia: Secondary | ICD-10-CM | POA: Diagnosis not present

## 2020-03-30 DIAGNOSIS — I7300 Raynaud's syndrome without gangrene: Secondary | ICD-10-CM | POA: Diagnosis not present

## 2020-03-30 DIAGNOSIS — M25461 Effusion, right knee: Secondary | ICD-10-CM | POA: Diagnosis not present

## 2020-03-30 DIAGNOSIS — M25471 Effusion, right ankle: Secondary | ICD-10-CM | POA: Diagnosis not present

## 2020-03-30 DIAGNOSIS — D869 Sarcoidosis, unspecified: Secondary | ICD-10-CM | POA: Diagnosis not present

## 2020-03-30 DIAGNOSIS — R9389 Abnormal findings on diagnostic imaging of other specified body structures: Secondary | ICD-10-CM | POA: Diagnosis not present

## 2020-03-30 DIAGNOSIS — N1832 Chronic kidney disease, stage 3b: Secondary | ICD-10-CM | POA: Diagnosis not present

## 2020-04-06 ENCOUNTER — Other Ambulatory Visit: Payer: Self-pay

## 2020-04-06 ENCOUNTER — Ambulatory Visit
Admission: RE | Admit: 2020-04-06 | Discharge: 2020-04-06 | Disposition: A | Discharge status: Home / Self Care | Payer: Medicare HMO | Source: Ambulatory Visit | Attending: Physician Assistant | Admitting: Physician Assistant

## 2020-04-06 DIAGNOSIS — Z1231 Encounter for screening mammogram for malignant neoplasm of breast: Secondary | ICD-10-CM | POA: Diagnosis not present

## 2020-04-08 DIAGNOSIS — R69 Illness, unspecified: Secondary | ICD-10-CM | POA: Diagnosis not present

## 2020-04-11 DIAGNOSIS — N184 Chronic kidney disease, stage 4 (severe): Secondary | ICD-10-CM | POA: Diagnosis not present

## 2020-04-11 DIAGNOSIS — K219 Gastro-esophageal reflux disease without esophagitis: Secondary | ICD-10-CM | POA: Diagnosis not present

## 2020-04-11 DIAGNOSIS — M179 Osteoarthritis of knee, unspecified: Secondary | ICD-10-CM | POA: Diagnosis not present

## 2020-04-11 DIAGNOSIS — Z23 Encounter for immunization: Secondary | ICD-10-CM | POA: Diagnosis not present

## 2020-04-18 DIAGNOSIS — R69 Illness, unspecified: Secondary | ICD-10-CM | POA: Diagnosis not present

## 2020-04-25 DIAGNOSIS — M179 Osteoarthritis of knee, unspecified: Secondary | ICD-10-CM | POA: Diagnosis not present

## 2020-04-25 DIAGNOSIS — E039 Hypothyroidism, unspecified: Secondary | ICD-10-CM | POA: Diagnosis not present

## 2020-04-25 DIAGNOSIS — E7800 Pure hypercholesterolemia, unspecified: Secondary | ICD-10-CM | POA: Diagnosis not present

## 2020-04-25 DIAGNOSIS — N184 Chronic kidney disease, stage 4 (severe): Secondary | ICD-10-CM | POA: Diagnosis not present

## 2020-04-25 DIAGNOSIS — Z79899 Other long term (current) drug therapy: Secondary | ICD-10-CM | POA: Diagnosis not present

## 2020-04-25 DIAGNOSIS — Z23 Encounter for immunization: Secondary | ICD-10-CM | POA: Diagnosis not present

## 2020-04-25 DIAGNOSIS — Z0000 Encounter for general adult medical examination without abnormal findings: Secondary | ICD-10-CM | POA: Diagnosis not present

## 2020-04-25 DIAGNOSIS — N183 Chronic kidney disease, stage 3 unspecified: Secondary | ICD-10-CM | POA: Diagnosis not present

## 2020-04-25 DIAGNOSIS — K219 Gastro-esophageal reflux disease without esophagitis: Secondary | ICD-10-CM | POA: Diagnosis not present

## 2020-04-25 DIAGNOSIS — D473 Essential (hemorrhagic) thrombocythemia: Secondary | ICD-10-CM | POA: Diagnosis not present

## 2020-05-02 DIAGNOSIS — R69 Illness, unspecified: Secondary | ICD-10-CM | POA: Diagnosis not present

## 2020-05-02 DIAGNOSIS — E039 Hypothyroidism, unspecified: Secondary | ICD-10-CM | POA: Diagnosis not present

## 2020-05-02 DIAGNOSIS — M179 Osteoarthritis of knee, unspecified: Secondary | ICD-10-CM | POA: Diagnosis not present

## 2020-05-02 DIAGNOSIS — I4891 Unspecified atrial fibrillation: Secondary | ICD-10-CM | POA: Diagnosis not present

## 2020-05-02 DIAGNOSIS — E7800 Pure hypercholesterolemia, unspecified: Secondary | ICD-10-CM | POA: Diagnosis not present

## 2020-05-02 DIAGNOSIS — N184 Chronic kidney disease, stage 4 (severe): Secondary | ICD-10-CM | POA: Diagnosis not present

## 2020-05-02 DIAGNOSIS — M1990 Unspecified osteoarthritis, unspecified site: Secondary | ICD-10-CM | POA: Diagnosis not present

## 2020-05-02 DIAGNOSIS — N183 Chronic kidney disease, stage 3 unspecified: Secondary | ICD-10-CM | POA: Diagnosis not present

## 2020-05-22 ENCOUNTER — Other Ambulatory Visit: Payer: Self-pay | Admitting: Hematology

## 2020-05-22 DIAGNOSIS — D473 Essential (hemorrhagic) thrombocythemia: Secondary | ICD-10-CM

## 2020-05-26 DIAGNOSIS — E7800 Pure hypercholesterolemia, unspecified: Secondary | ICD-10-CM | POA: Diagnosis not present

## 2020-05-26 DIAGNOSIS — M1990 Unspecified osteoarthritis, unspecified site: Secondary | ICD-10-CM | POA: Diagnosis not present

## 2020-05-26 DIAGNOSIS — M179 Osteoarthritis of knee, unspecified: Secondary | ICD-10-CM | POA: Diagnosis not present

## 2020-05-26 DIAGNOSIS — N184 Chronic kidney disease, stage 4 (severe): Secondary | ICD-10-CM | POA: Diagnosis not present

## 2020-05-26 DIAGNOSIS — K219 Gastro-esophageal reflux disease without esophagitis: Secondary | ICD-10-CM | POA: Diagnosis not present

## 2020-05-26 DIAGNOSIS — R69 Illness, unspecified: Secondary | ICD-10-CM | POA: Diagnosis not present

## 2020-05-26 DIAGNOSIS — I4891 Unspecified atrial fibrillation: Secondary | ICD-10-CM | POA: Diagnosis not present

## 2020-05-26 DIAGNOSIS — N183 Chronic kidney disease, stage 3 unspecified: Secondary | ICD-10-CM | POA: Diagnosis not present

## 2020-05-26 DIAGNOSIS — E039 Hypothyroidism, unspecified: Secondary | ICD-10-CM | POA: Diagnosis not present

## 2020-05-30 ENCOUNTER — Other Ambulatory Visit: Payer: Self-pay | Admitting: Hematology

## 2020-05-30 DIAGNOSIS — D473 Essential (hemorrhagic) thrombocythemia: Secondary | ICD-10-CM

## 2020-05-31 ENCOUNTER — Inpatient Hospital Stay: Payer: Medicare HMO

## 2020-05-31 ENCOUNTER — Inpatient Hospital Stay: Payer: Medicare HMO | Attending: Hematology | Admitting: Hematology

## 2020-05-31 ENCOUNTER — Other Ambulatory Visit: Payer: Self-pay

## 2020-05-31 VITALS — BP 156/70 | HR 88 | Temp 97.4°F | Resp 18 | Ht 61.0 in | Wt 150.0 lb

## 2020-05-31 DIAGNOSIS — D471 Chronic myeloproliferative disease: Secondary | ICD-10-CM

## 2020-05-31 DIAGNOSIS — Z23 Encounter for immunization: Secondary | ICD-10-CM | POA: Insufficient documentation

## 2020-05-31 DIAGNOSIS — Z87891 Personal history of nicotine dependence: Secondary | ICD-10-CM | POA: Diagnosis not present

## 2020-05-31 DIAGNOSIS — Z1589 Genetic susceptibility to other disease: Secondary | ICD-10-CM

## 2020-05-31 DIAGNOSIS — E611 Iron deficiency: Secondary | ICD-10-CM | POA: Diagnosis not present

## 2020-05-31 DIAGNOSIS — Z79899 Other long term (current) drug therapy: Secondary | ICD-10-CM | POA: Diagnosis not present

## 2020-05-31 DIAGNOSIS — I700 Atherosclerosis of aorta: Secondary | ICD-10-CM | POA: Insufficient documentation

## 2020-05-31 DIAGNOSIS — D863 Sarcoidosis of skin: Secondary | ICD-10-CM | POA: Insufficient documentation

## 2020-05-31 DIAGNOSIS — C946 Myelodysplastic disease, not classified: Secondary | ICD-10-CM | POA: Diagnosis present

## 2020-05-31 DIAGNOSIS — R591 Generalized enlarged lymph nodes: Secondary | ICD-10-CM | POA: Insufficient documentation

## 2020-05-31 DIAGNOSIS — D473 Essential (hemorrhagic) thrombocythemia: Secondary | ICD-10-CM

## 2020-05-31 LAB — CMP (CANCER CENTER ONLY)
ALT: 10 U/L (ref 0–44)
AST: 14 U/L — ABNORMAL LOW (ref 15–41)
Albumin: 4.1 g/dL (ref 3.5–5.0)
Alkaline Phosphatase: 105 U/L (ref 38–126)
Anion gap: 12 (ref 5–15)
BUN: 30 mg/dL — ABNORMAL HIGH (ref 8–23)
CO2: 22 mmol/L (ref 22–32)
Calcium: 8.9 mg/dL (ref 8.9–10.3)
Chloride: 106 mmol/L (ref 98–111)
Creatinine: 1.84 mg/dL — ABNORMAL HIGH (ref 0.44–1.00)
GFR, Estimated: 29 mL/min — ABNORMAL LOW (ref >60)
Glucose, Bld: 114 mg/dL — ABNORMAL HIGH (ref 70–99)
Potassium: 4.6 mmol/L (ref 3.5–5.1)
Sodium: 140 mmol/L (ref 135–145)
Total Bilirubin: 0.3 mg/dL (ref 0.3–1.2)
Total Protein: 7.3 g/dL (ref 6.5–8.1)

## 2020-05-31 LAB — CBC WITH DIFFERENTIAL/PLATELET
Abs Immature Granulocytes: 0.04 10*3/uL (ref 0.00–0.07)
Basophils Absolute: 0 10*3/uL (ref 0.0–0.1)
Basophils Relative: 0 %
Eosinophils Absolute: 0 10*3/uL (ref 0.0–0.5)
Eosinophils Relative: 1 %
HCT: 28.5 % — ABNORMAL LOW (ref 36.0–46.0)
Hemoglobin: 8.7 g/dL — ABNORMAL LOW (ref 12.0–15.0)
Immature Granulocytes: 1 %
Lymphocytes Relative: 14 %
Lymphs Abs: 1.2 10*3/uL (ref 0.7–4.0)
MCH: 29.6 pg (ref 26.0–34.0)
MCHC: 30.5 g/dL (ref 30.0–36.0)
MCV: 96.9 fL (ref 80.0–100.0)
Monocytes Absolute: 0.6 10*3/uL (ref 0.1–1.0)
Monocytes Relative: 7 %
Neutro Abs: 6.6 10*3/uL (ref 1.7–7.7)
Neutrophils Relative %: 77 %
Platelets: 442 10*3/uL — ABNORMAL HIGH (ref 150–400)
RBC: 2.94 MIL/uL — ABNORMAL LOW (ref 3.87–5.11)
RDW: 14.9 % (ref 11.5–15.5)
WBC: 8.4 10*3/uL (ref 4.0–10.5)
nRBC: 0 % (ref 0.0–0.2)

## 2020-05-31 NOTE — Progress Notes (Signed)
   Covid-19 Vaccination Clinic  Name:  AAMORI MCMASTERS    MRN: [id number] DOB: [birth year]  05/31/2020  Ms. Kitchen was observed post Covid-19 immunization for 15 minutes without incident. She was provided with Vaccine Information Sheet and instruction to access the V-Safe system.   Ms. Harris was instructed to call 911 with any severe reactions post vaccine: Marland Kitchen Difficulty breathing  . Swelling of face and throat  . A fast heartbeat  . A bad rash all over body  . Dizziness and weakness   Immunizations Administered    Name Date Dose VIS Date Route   Pfizer COVID-19 Vaccine 05/31/2020  4:08 PM 0.3 mL 04/27/2020 Intramuscular   Manufacturer: [place]   Lot: [id number]   [facility]: [id number]

## 2020-05-31 NOTE — Progress Notes (Signed)
HEMATOLOGY/ONCOLOGY CLINIC NOTE  Date of Service: 05/31/20    Patient Care Team: Lawerance Cruel, MD as PCP - General (Family Medicine) [name], MD as PCP - Cardiology (Cardiology)  CHIEF COMPLAINTS/PURPOSE OF CONSULTATION:  F/u for mx of Jak2 MPN  HISTORY OF PRESENTING ILLNESS:   Alexis Murphy is a wonderful 74 y.o. 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 The pt reports that she is doing well overall. She has seen dermatology and rheumatology with work up of her sarcoidosis. She is seeing Dr Christinia Gully in one month in Pulmonology.   She notes that her skin changes first appeared 10 years ago.   The pt reports that her recent nose bx with [facility] a week ago revealed sarcoidosis and her right foot bx revealed stasis dermatitis. She has been diagnosed with sarcoidosis before this. She began a 20mg  prednisone 29-month-taper, three weeks ago with Dr. [name], and is also taking Plaquenil for her sarcoidosis. She had a CT on 09/19/17 to properly work up her sarcoidosis diagnosis.   She notes that prior to taking steroids her feet were very painful and also had some neuropathy. She also had night sweats and leg and ankle swelling but did not have fatigue or shortness of breath. These positive symptoms have resolved after beginning Prednisone.   She denies any breast symptoms. She has a MMG on 08/05/2017 which showed : IMPRESSION: New suspicious left breast mass 7:00 position retroareolar measuring 0.6 cm on ultrasound. Malignancy cannot be excluded. The mass does have similar imaging appearance to the previously biopsied left breast mass (January 2017, which demonstrated granulomatous inflammation and no evidence of malignancy at pathology.) The possibility of a second area of granulomatous inflammation is considered.  Borderline diffuse cortical thickening of a left axillary  lymph node. On review of the patient's prior CT a of the chest, patient does have mild diffuse lymphadenopathy in the thorax. At this time, axillary lymph node biopsy is not felt warranted.  The patient was given a copy of her pathology report from her left breast biopsy from January 2017. I told her she may want to share this information with her rheumatologist.  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   Of note prior to the patient's visit today, pt has had CT Chest completed on 09/19/17 with results revealing 1. Moderate mediastinal and right supraclavicular lymphadenopathy, increased. New mild bilateral axillary and bilateral hilar lymphadenopathy. Findings are worrisome for a malignant process such as a lymphoproliferative disorder or metastatic nodal disease. The right supraclavicular node may be (but not definitely) accessible for percutaneous biopsy. 2. Numerous similar-appearing subsolid spiculated pulmonary nodules scattered throughout both lungs, upper lobe predominant, largest 1.6 cm, stable to mildly increased in size and slightly increased in density in the interval. These are indeterminate, with multifocal lung adenocarcinoma on the differential. 3. Several new subcentimeter hypodense splenic lesions. Solitary small hypodense posterior right liver lobe lesion. These lesions are indeterminate, with lymphoproliferative disorder or metastatic disease not excluded. Options include further characterization with MRI abdomen without and with IV contrast or short-term follow-up CT abdomen with IV contrast in 3 months. 4. Chronic findings include: Left main and 3 vessel coronary atherosclerosis. Small hiatal hernia. Cholelithiasis.   Most recent lab results (03/10/17) of CBC  is as follows: all values are WNL except for WBC at 17.5k, MCV at 75.8, MCH at 25.2, RDW at 17.6,  Platelets at 804k.  On review of systems, pt reports dry mouth, decreased leg swelling, and  denies tingling, numbness in her hands or feet, dry eyes, problems breathing, cough, SOB, CP, nose bleeds, fatigue, abdominal pains, unexpected weight loss, fevers, chills, night sweats.   On PMHx the pt reports sarcoidosis, Bell's palsy twice.  On Social Hx the pt reports that she smoked cigarettes for 45 years. She quit smoking cigarettes 10 years ago but continues to smoke marijuana occasionally.  On Family Hx the pt reports maternal lupus.  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 The patient's last visit with [place] was on 02/26/2020. The pt reports that she is doing well overall.  The pt reports that beginning in mid-September she began to experience pain in her left knee. She has been dealing with pain in both knees since. Pt was told that she was having a flare. Pt is currently on a Prenisone taper. She is on 5 mg and will soon go to 4 mg. She has continued 500 mg Hydroxyurea per day. Pt believes that her Sarcoidosis is currently stable.   Lab results today (05/31/20) of CBC w/diff and CMP is as follows: all values are WNL except for RBC at 2.94, Hgb at 8.7, HCT at 28.5, PLT at 442K, Glucose at 114, BUN at 30, Creatinine at 1.84, AST at 14, GFR Est at 29.  On review of systems, pt reports knee pain and denies any other symptoms.    MEDICAL HISTORY:  Past Medical History:  Diagnosis Date  . Arthritis   . Hyperlipidemia   . Sarcoidosis     SURGICAL HISTORY: Past Surgical History:  Procedure Laterality Date  . ABDOMINAL HYSTERECTOMY    . BREAST BIOPSY    . BREAST SURGERY    . REDUCTION MAMMAPLASTY      SOCIAL HISTORY: Social History   Socioeconomic History  . Marital status: Widowed    Spouse name: Not on file  . Number of children: Not on file  . Years of education: Not on file  . Highest education level: Not on file  Occupational History  . Not on file  Tobacco Use  . Smoking status: Former Smoker    Packs/day: 1.00     Years: 45.00    Pack years: 45.00    Types: Cigarettes    Quit date: 10/15/2007    Years since quitting: 12.6  . Smokeless tobacco: Never Used  Vaping Use  . Vaping Use: Never used  Substance and Sexual Activity  . Alcohol use: Yes  . Drug use: No  . Sexual activity: Not on file  Other Topics Concern  . Not on file  Social History Narrative  . Not on file   Social Determinants of Health   Financial Resource Strain:   . Difficulty of Paying Living Expenses: Not on file  Food Insecurity:   . Worried About Charity fundraiser in the Last Year: Not on file  . Ran Out of Food in the Last Year: Not on file  Transportation Needs:   . Lack of Transportation (Medical): Not on file  . Lack of Transportation (Non-Medical): Not on file  Physical Activity:   . Days of Exercise per Week: Not on file  . Minutes of Exercise per Session: Not on file  Stress:   . Feeling of Stress : Not on file  Social Connections:   . Frequency of Communication with Friends and Family: Not on  file  . Frequency of Social Gatherings with Friends and Family: Not on file  . Attends Religious Services: Not on file  . Active Member of Clubs or Organizations: Not on file  . Attends Archivist Meetings: Not on file  . Marital Status: Not on file  Intimate Partner Violence:   . Fear of Current or Ex-Partner: Not on file  . Emotionally Abused: Not on file  . Physically Abused: Not on file  . Sexually Abused: Not on file    FAMILY HISTORY: Family History  Problem Relation Age of Onset  . Heart disease Father   . Heart disease Brother     ALLERGIES:  is allergic to penicillins and codeine.  MEDICATIONS:  Current Outpatient Medications  Medication Sig Dispense Refill  . acetaminophen (TYLENOL) 325 MG tablet Take 1 tablet (325 mg total) by mouth every 6 (six) hours. (Patient taking differently: Take 325 mg by mouth every 6 (six) hours as needed for headache. ) 30 tablet 0  . amLODipine  (NORVASC) 5 MG tablet Take 5 mg by mouth every evening.     Marland Kitchen aspirin EC 81 MG tablet Take 81 mg by mouth daily.    Marland Kitchen atorvastatin (LIPITOR) 40 MG tablet Take 40 mg by mouth every evening.     . folic acid (FOLVITE) 1 MG tablet Take 1 mg by mouth daily.    . hydroxyurea (HYDREA) 500 MG capsule TAKE ONE TABLET BY MOUTH EVERY MORNING 30 capsule 3  . levothyroxine (SYNTHROID) 50 MCG tablet Take 50 mcg by mouth every morning.    . Melatonin 5 MG TABS Take 5 mg by mouth at bedtime.    Marland Kitchen omeprazole (PRILOSEC) 40 MG capsule Take 40 mg by mouth every evening.     . potassium chloride SA (KLOR-CON) 20 MEQ tablet TAKE ONE TABLET BY MOUTH ONCE DAILY 30 tablet 1  . predniSONE (DELTASONE) 5 MG tablet Take 5 mg by mouth daily.    Marland Kitchen venlafaxine XR (EFFEXOR-XR) 150 MG 24 hr capsule Take 150 mg by mouth every evening.      No current facility-administered medications for this visit.    REVIEW OF SYSTEMS:   A 10+ POINT REVIEW OF SYSTEMS WAS OBTAINED including neurology, dermatology, psychiatry, cardiac, respiratory, lymph, extremities, GI, GU, Musculoskeletal, constitutional, breasts, reproductive, HEENT.  All pertinent positives are noted in the HPI.  All others are negative.   PHYSICAL EXAMINATION: ECOG FS:2 - Symptomatic, <50% confined to bed  Vitals:   05/31/20 1440  BP: (!) 156/70  Pulse: 88  Resp: 18  Temp: (!) 97.4 F (36.3 C)  SpO2: 100%   Wt Readings from Last 3 Encounters:  05/31/20 150 lb (68 kg)  02/26/20 152 lb 8 oz (69.2 kg)  11/19/19 160 lb 4.8 oz (72.7 kg)   Body mass index is 28.34 kg/m.    GENERAL:alert, in no acute distress and comfortable SKIN: no acute rashes, no significant lesions EYES: conjunctiva are pink and non-injected, sclera anicteric OROPHARYNX: MMM, no exudates, no oropharyngeal erythema or ulceration NECK: supple, no JVD LYMPH:  no palpable lymphadenopathy in the cervical, axillary or inguinal regions LUNGS: clear to auscultation b/l with normal respiratory  effort HEART: regular rate & rhythm ABDOMEN:  normoactive bowel sounds , non tender, not distended. No palpable hepatosplenomegaly.  Extremity: no pedal edema PSYCH: alert & oriented x 3 with fluent speech NEURO: no focal motor/sensory deficits  LABORATORY DATA:  I have reviewed the data as listed  . CBC Latest Ref  Rng & Units 05/31/2020 02/26/2020 11/19/2019  WBC 4.0 - 10.5 K/uL 8.4 11.1(H) 8.4  Hemoglobin 12.0 - 15.0 g/dL 8.7(L) 11.1(L) 10.5(L)  Hematocrit 36 - 46 % 28.5(L) 34.5(L) 32.7(L)  Platelets 150 - 400 K/uL 442(H) 374 413(H)    . CMP Latest Ref Rng & Units 05/31/2020 02/26/2020 11/19/2019  Glucose 70 - 99 mg/dL 114(H) 107(H) 135(H)  BUN 8 - 23 mg/dL 30(H) 16 21  Creatinine 0.44 - 1.00 mg/dL 1.84(H) 1.88(H) 1.65(H)  Sodium 135 - 145 mmol/L 140 138 139  Potassium 3.5 - 5.1 mmol/L 4.6 3.8 3.1(L)  Chloride 98 - 111 mmol/L 106 102 101  CO2 22 - 32 mmol/L 22 23 22   Calcium 8.9 - 10.3 mg/dL 8.9 10.1 9.1  Total Protein 6.5 - 8.1 g/dL 7.3 7.5 7.2  Total Bilirubin 0.3 - 1.2 mg/dL 0.3 0.6 0.4  Alkaline Phos 38 - 126 U/L 105 114 100  AST 15 - 41 U/L 14(L) 16 16  ALT 0 - 44 U/L 10 10 12            RADIOGRAPHIC STUDIES: I have personally reviewed the radiological images as listed and agreed with the findings in the report. No results found. CT chest 09/19/2017: IMPRESSION: 1. Moderate mediastinal and right supraclavicular lymphadenopathy, increased. New mild bilateral axillary and bilateral hilar lymphadenopathy. Findings are worrisome for a malignant process such as a lymphoproliferative disorder or metastatic nodal disease. The right supraclavicular node may be (but not definitely) accessible for percutaneous biopsy. 2. Numerous similar-appearing subsolid spiculated pulmonary nodules scattered throughout both lungs, upper lobe predominant, largest 1.6 cm, stable to mildly increased in size and slightly increased in density in the interval. These are indeterminate, with  multifocal lung adenocarcinoma on the differential. 3. Several new subcentimeter hypodense splenic lesions. Solitary small hypodense posterior right liver lobe lesion. These lesions are indeterminate, with lymphoproliferative disorder or metastatic disease not excluded. Options include further characterization with MRI abdomen without and with IV contrast or short-term follow-up CT abdomen with IV contrast in 3 months. 4. Chronic findings include: Left main and 3 vessel coronary atherosclerosis. Small hiatal hernia. Cholelithiasis.  Aortic Atherosclerosis (ICD10-I70.0).  Electronically Signed   By: Ilona Sorrel M.D.   On: 09/19/2017 11:54   ASSESSMENT & PLAN:   74 y.o. female with  1. Sarcoidosis Presented with Generalized lymphadenopathy with multiple pulmonary nodules CT Chest 09/19/2017: Moderate mediastinal and right supraclavicular lymphadenopathy, increased. New mild bilateral axillary and bilateral hilar lymphadenopathy. Findings are worrisome for a malignant process such as a lymphoproliferative disorder or metastatic nodal disease. The right supraclavicular node may be (but not definitely) accessible for percutaneous biopsy. 2. Numerous similar-appearing subsolid spiculated pulmonary nodules scattered throughout both lungs, upper lobe predominant, largest 1.6 cm, stable to mildly increased in size and slightly increased in density in the interval. These are indeterminate, with multifocal lung adenocarcinoma on the differential.  12/26/17 CT C/A/P revealed interval response to steroids in LNadenopathy   07/17/18 CT C/A/P revealed Continue improvement in mediastinal adenopathy. Lymph nodes are now not pathologic by size criteria. 2. Continued improvement in nodular and ground-glass densities particularly in the RIGHT upper lobe. 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  2. Breast - lesion - Bx -- granulomatous inflammation -consistent with sarcoidosis.  3. Skin  rash on nose - Bx consistent with sarcoidosis.  4. H/o  Iron deficiency - no significant anemia . 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  -Advised pt to reduce Hydroxurea to 500 mg twice per week. No prohibitive toxicities.  -Advised pt that we would continue Hydroxyurea at this dose if anemia improves and PLT are steady.  -Advised pt that if anemia worsens or PLT increases we would switch to [facility].  -Discussed CDC guidelines regarding the [place] booster. Will give in clinic today. -Discussed referral for transplant consideration - pt would prefer to hold off at this time. -Continue Aspirin -Will see back in 6 weeks with labs   FOLLOW UP: Covid booster today RTC with Dr Irene Limbo with labs in 6 weeks   The total time spent in the appt was 20 minutes and more than 50% was on counseling and direct patient cares.  All of the patient's questions were answered with apparent satisfaction. The patient knows to call the clinic with any problems, questions or concerns.   Sullivan Lone MD [place] AAHIVMS [facility] Hematology/Oncology Physician [facility]  (Office):       [phone] (Work cell):  [phone] (Fax):           [phone]  05/31/2020 3:26 PM  I, Yevette Edwards, am acting as a scribe for  Dr. Sullivan Lone.   .I have reviewed the above documentation for accuracy and completeness, and I agree with the above. Brunetta Genera MD

## 2020-05-31 NOTE — Telephone Encounter (Signed)
Please review for refill.  

## 2020-06-14 DIAGNOSIS — Z1589 Genetic susceptibility to other disease: Secondary | ICD-10-CM | POA: Diagnosis not present

## 2020-06-14 DIAGNOSIS — D473 Essential (hemorrhagic) thrombocythemia: Secondary | ICD-10-CM | POA: Diagnosis not present

## 2020-06-14 DIAGNOSIS — Z7952 Long term (current) use of systemic steroids: Secondary | ICD-10-CM | POA: Diagnosis not present

## 2020-06-14 DIAGNOSIS — R69 Illness, unspecified: Secondary | ICD-10-CM | POA: Diagnosis not present

## 2020-06-14 DIAGNOSIS — D869 Sarcoidosis, unspecified: Secondary | ICD-10-CM | POA: Diagnosis not present

## 2020-06-14 DIAGNOSIS — M25462 Effusion, left knee: Secondary | ICD-10-CM | POA: Diagnosis not present

## 2020-06-14 DIAGNOSIS — I7300 Raynaud's syndrome without gangrene: Secondary | ICD-10-CM | POA: Diagnosis not present

## 2020-06-14 DIAGNOSIS — M25461 Effusion, right knee: Secondary | ICD-10-CM | POA: Diagnosis not present

## 2020-06-14 DIAGNOSIS — H348112 Central retinal vein occlusion, right eye, stable: Secondary | ICD-10-CM | POA: Diagnosis not present

## 2020-06-14 DIAGNOSIS — N1832 Chronic kidney disease, stage 3b: Secondary | ICD-10-CM | POA: Diagnosis not present

## 2020-07-06 DIAGNOSIS — N184 Chronic kidney disease, stage 4 (severe): Secondary | ICD-10-CM | POA: Diagnosis not present

## 2020-07-06 DIAGNOSIS — M1990 Unspecified osteoarthritis, unspecified site: Secondary | ICD-10-CM | POA: Diagnosis not present

## 2020-07-06 DIAGNOSIS — I4891 Unspecified atrial fibrillation: Secondary | ICD-10-CM | POA: Diagnosis not present

## 2020-07-06 DIAGNOSIS — E039 Hypothyroidism, unspecified: Secondary | ICD-10-CM | POA: Diagnosis not present

## 2020-07-06 DIAGNOSIS — K219 Gastro-esophageal reflux disease without esophagitis: Secondary | ICD-10-CM | POA: Diagnosis not present

## 2020-07-06 DIAGNOSIS — R69 Illness, unspecified: Secondary | ICD-10-CM | POA: Diagnosis not present

## 2020-07-06 DIAGNOSIS — M179 Osteoarthritis of knee, unspecified: Secondary | ICD-10-CM | POA: Diagnosis not present

## 2020-07-06 DIAGNOSIS — E7800 Pure hypercholesterolemia, unspecified: Secondary | ICD-10-CM | POA: Diagnosis not present

## 2020-07-11 ENCOUNTER — Other Ambulatory Visit: Payer: Self-pay | Admitting: Hematology

## 2020-07-11 DIAGNOSIS — D471 Chronic myeloproliferative disease: Secondary | ICD-10-CM

## 2020-07-11 NOTE — Progress Notes (Signed)
Labs ordered.

## 2020-07-12 ENCOUNTER — Other Ambulatory Visit: Payer: Self-pay

## 2020-07-12 ENCOUNTER — Inpatient Hospital Stay: Payer: Medicare HMO | Attending: Hematology | Admitting: Hematology

## 2020-07-12 ENCOUNTER — Inpatient Hospital Stay: Payer: Medicare HMO

## 2020-07-12 VITALS — BP 160/65 | HR 99 | Temp 98.0°F | Resp 15 | Ht 61.0 in | Wt 149.1 lb

## 2020-07-12 DIAGNOSIS — Z1589 Genetic susceptibility to other disease: Secondary | ICD-10-CM | POA: Insufficient documentation

## 2020-07-12 DIAGNOSIS — Z862 Personal history of diseases of the blood and blood-forming organs and certain disorders involving the immune mechanism: Secondary | ICD-10-CM | POA: Insufficient documentation

## 2020-07-12 DIAGNOSIS — D863 Sarcoidosis of skin: Secondary | ICD-10-CM | POA: Diagnosis not present

## 2020-07-12 DIAGNOSIS — Z87891 Personal history of nicotine dependence: Secondary | ICD-10-CM | POA: Diagnosis not present

## 2020-07-12 DIAGNOSIS — D471 Chronic myeloproliferative disease: Secondary | ICD-10-CM | POA: Diagnosis not present

## 2020-07-12 DIAGNOSIS — Z7982 Long term (current) use of aspirin: Secondary | ICD-10-CM | POA: Diagnosis not present

## 2020-07-12 DIAGNOSIS — Z79899 Other long term (current) drug therapy: Secondary | ICD-10-CM | POA: Diagnosis not present

## 2020-07-12 DIAGNOSIS — D649 Anemia, unspecified: Secondary | ICD-10-CM

## 2020-07-12 LAB — CBC WITH DIFFERENTIAL/PLATELET
Abs Immature Granulocytes: 0.03 10*3/uL (ref 0.00–0.07)
Basophils Absolute: 0.1 10*3/uL (ref 0.0–0.1)
Basophils Relative: 1 %
Eosinophils Absolute: 0.1 10*3/uL (ref 0.0–0.5)
Eosinophils Relative: 1 %
HCT: 30.2 % — ABNORMAL LOW (ref 36.0–46.0)
Hemoglobin: 9 g/dL — ABNORMAL LOW (ref 12.0–15.0)
Immature Granulocytes: 0 %
Lymphocytes Relative: 18 %
Lymphs Abs: 1.4 10*3/uL (ref 0.7–4.0)
MCH: 26.5 pg (ref 26.0–34.0)
MCHC: 29.8 g/dL — ABNORMAL LOW (ref 30.0–36.0)
MCV: 88.8 fL (ref 80.0–100.0)
Monocytes Absolute: 0.7 10*3/uL (ref 0.1–1.0)
Monocytes Relative: 9 %
Neutro Abs: 5.5 10*3/uL (ref 1.7–7.7)
Neutrophils Relative %: 71 %
Platelets: 422 10*3/uL — ABNORMAL HIGH (ref 150–400)
RBC: 3.4 MIL/uL — ABNORMAL LOW (ref 3.87–5.11)
RDW: 14.6 % (ref 11.5–15.5)
WBC: 7.7 10*3/uL (ref 4.0–10.5)
nRBC: 0 % (ref 0.0–0.2)

## 2020-07-12 LAB — CMP (CANCER CENTER ONLY)
ALT: 13 U/L (ref 0–44)
AST: 17 U/L (ref 15–41)
Albumin: 4 g/dL (ref 3.5–5.0)
Alkaline Phosphatase: 113 U/L (ref 38–126)
Anion gap: 10 (ref 5–15)
BUN: 24 mg/dL — ABNORMAL HIGH (ref 8–23)
CO2: 24 mmol/L (ref 22–32)
Calcium: 8.8 mg/dL — ABNORMAL LOW (ref 8.9–10.3)
Chloride: 105 mmol/L (ref 98–111)
Creatinine: 1.73 mg/dL — ABNORMAL HIGH (ref 0.44–1.00)
GFR, Estimated: 31 mL/min — ABNORMAL LOW (ref >60)
Glucose, Bld: 97 mg/dL (ref 70–99)
Potassium: 4.1 mmol/L (ref 3.5–5.1)
Sodium: 139 mmol/L (ref 135–145)
Total Bilirubin: 0.4 mg/dL (ref 0.3–1.2)
Total Protein: 7.3 g/dL (ref 6.5–8.1)

## 2020-07-12 LAB — RETICULOCYTES
Immature Retic Fract: 15.2 % (ref 2.3–15.9)
RBC.: 3.31 MIL/uL — ABNORMAL LOW (ref 3.87–5.11)
Retic Count, Absolute: 50.6 10*3/uL (ref 19.0–186.0)
Retic Ct Pct: 1.5 % (ref 0.4–3.1)

## 2020-07-12 LAB — LACTATE DEHYDROGENASE: LDH: 194 U/L — ABNORMAL HIGH (ref 98–192)

## 2020-07-12 LAB — SAMPLE TO BLOOD BANK

## 2020-07-12 MED ORDER — POLYSACCHARIDE IRON COMPLEX 150 MG PO CAPS: 150.0000 mg | ORAL_CAPSULE | Freq: Every day | ORAL | 2 refills | Status: DC

## 2020-07-12 MED ORDER — VITAMIN D 25 MCG (1000 UNIT) PO TABS
2000.0000 [IU] | ORAL_TABLET | Freq: Every day | ORAL | 5 refills | Status: DC
Start: 2020-07-12 — End: 2020-11-18

## 2020-07-12 NOTE — Progress Notes (Signed)
HEMATOLOGY/ONCOLOGY CLINIC NOTE  Date of Service: 07/12/20    Patient Care Team: Lawerance Cruel, MD as PCP - General (Family Medicine) [name], MD as PCP - Cardiology (Cardiology)  CHIEF COMPLAINTS/PURPOSE OF CONSULTATION:  F/u for mx of Jak2 MPN  HISTORY OF PRESENTING ILLNESS:   Alexis Murphy is a wonderful 75 y.o. 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 The pt reports that she is doing well overall. She has seen dermatology and rheumatology with work up of her sarcoidosis. She is seeing Dr Christinia Gully in one month in Pulmonology.   She notes that her skin changes first appeared 10 years ago.   The pt reports that her recent nose bx with [facility] a week ago revealed sarcoidosis and her right foot bx revealed stasis dermatitis. She has been diagnosed with sarcoidosis before this. She began a 20mg  prednisone 65-month-taper, three weeks ago with Dr. [name], and is also taking Plaquenil for her sarcoidosis. She had a CT on 09/19/17 to properly work up her sarcoidosis diagnosis.   She notes that prior to taking steroids her feet were very painful and also had some neuropathy. She also had night sweats and leg and ankle swelling but did not have fatigue or shortness of breath. These positive symptoms have resolved after beginning Prednisone.   She denies any breast symptoms. She has a MMG on 08/05/2017 which showed : IMPRESSION: New suspicious left breast mass 7:00 position retroareolar measuring 0.6 cm on ultrasound. Malignancy cannot be excluded. The mass does have similar imaging appearance to the previously biopsied left breast mass (January 2017, which demonstrated granulomatous inflammation and no evidence of malignancy at pathology.) The possibility of a second area of granulomatous inflammation is considered.  Borderline diffuse cortical thickening of a left axillary  lymph node. On review of the patient's prior CT a of the chest, patient does have mild diffuse lymphadenopathy in the thorax. At this time, axillary lymph node biopsy is not felt warranted.  The patient was given a copy of her pathology report from her left breast biopsy from January 2017. I told her she may want to share this information with her rheumatologist.  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   Of note prior to the patient's visit today, pt has had CT Chest completed on 09/19/17 with results revealing 1. Moderate mediastinal and right supraclavicular lymphadenopathy, increased. New mild bilateral axillary and bilateral hilar lymphadenopathy. Findings are worrisome for a malignant process such as a lymphoproliferative disorder or metastatic nodal disease. The right supraclavicular node may be (but not definitely) accessible for percutaneous biopsy. 2. Numerous similar-appearing subsolid spiculated pulmonary nodules scattered throughout both lungs, upper lobe predominant, largest 1.6 cm, stable to mildly increased in size and slightly increased in density in the interval. These are indeterminate, with multifocal lung adenocarcinoma on the differential. 3. Several new subcentimeter hypodense splenic lesions. Solitary small hypodense posterior right liver lobe lesion. These lesions are indeterminate, with lymphoproliferative disorder or metastatic disease not excluded. Options include further characterization with MRI abdomen without and with IV contrast or short-term follow-up CT abdomen with IV contrast in 3 months. 4. Chronic findings include: Left main and 3 vessel coronary atherosclerosis. Small hiatal hernia. Cholelithiasis.   Most recent lab results (03/10/17) of CBC  is as follows: all values are WNL except for WBC at 17.5k, MCV at 75.8, MCH at 25.2, RDW at 17.6,  Platelets at 804k.  On review of systems, pt reports dry mouth, decreased leg swelling, and  denies tingling, numbness in her hands or feet, dry eyes, problems breathing, cough, SOB, CP, nose bleeds, fatigue, abdominal pains, unexpected weight loss, fevers, chills, night sweats.   On PMHx the pt reports sarcoidosis, Bell's palsy twice.  On Social Hx the pt reports that she smoked cigarettes for 45 years. She quit smoking cigarettes 10 years ago but continues to smoke marijuana occasionally.  On Family Hx the pt reports maternal lupus.  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 The patient's last visit with [place] was on 05/31/2020. The pt reports that she is doing well overall.  The pt reports that her right leg has granulomas and arthritis based on the X-Ray she had with [name] PA-C, Rheumatology. She has since been on Leflunomide since December 7 and has noted improvement. She notes her mother had Lupus. She is taking two Hydroxyurea a week and tolerating them well.   The pt has received her COVID19 vaccine and booster.   Lab results today (07/12/20) of CBC w/diff and CMP is as follows: all values are WNL except for RBC at 3.40, Hgb at 9.0, HCT at 30.2, MCHC at 29.8, PLT at 422K, BUN at 24, Creatinine at 1.73, and Calcium at 8.8, GFR Est at 31. 07/12/2020 Reticulocytes are all WNL. 07/12/2020 LDH at 194  On review of systems, pt reports denies neck swelling, breast pain/discomfort, hands and feet tingling/numbness, leg swelling and any other symptoms.   MEDICAL HISTORY:  Past Medical History:  Diagnosis Date  . Arthritis   . Hyperlipidemia   . Sarcoidosis     SURGICAL HISTORY: Past Surgical History:  Procedure Laterality Date  . ABDOMINAL HYSTERECTOMY    . BREAST BIOPSY    . BREAST SURGERY    . REDUCTION MAMMAPLASTY      SOCIAL HISTORY: Social History   Socioeconomic History  . Marital status: Widowed    Spouse name: Not on file  . Number of children: Not on file  . Years of education: Not on file  . Highest  education level: Not on file  Occupational History  . Not on file  Tobacco Use  . Smoking status: Former Smoker    Packs/day: 1.00    Years: 45.00    Pack years: 45.00    Types: Cigarettes    Quit date: 10/15/2007    Years since quitting: 12.7  . Smokeless tobacco: Never Used  Vaping Use  . Vaping Use: Never used  Substance and Sexual Activity  . Alcohol use: Yes  . Drug use: No  . Sexual activity: Not on file  Other Topics Concern  . Not on file  Social History Narrative  . Not on file   Social Determinants of Health   Financial Resource Strain: Not on file  Food Insecurity: Not on file  Transportation Needs: Not on file  Physical Activity: Not on file  Stress: Not on file  Social Connections: Not on file  Intimate Partner Violence: Not on file    FAMILY HISTORY: Family History  Problem Relation Age of Onset  . Heart disease Father   . Heart disease Brother     ALLERGIES:  is allergic to penicillins and codeine.  MEDICATIONS:  Current Outpatient Medications  Medication Sig Dispense Refill  . cholecalciferol (VITAMIN D3) 25 MCG (1000 UNIT) tablet Take 2 tablets (2,000 Units total) by mouth daily. [place]  tablet 5  . iron polysaccharides (NIFEREX) 150 MG capsule Take 1 capsule (150 mg total) by mouth daily. 30 capsule 2  . acetaminophen (TYLENOL) 325 MG tablet Take 1 tablet (325 mg total) by mouth every 6 (six) hours. (Patient taking differently: Take 325 mg by mouth every 6 (six) hours as needed for headache. ) 30 tablet 0  . amLODipine (NORVASC) 5 MG tablet Take 5 mg by mouth every evening.     Marland Kitchen aspirin EC 81 MG tablet Take 81 mg by mouth daily.    Marland Kitchen atorvastatin (LIPITOR) 40 MG tablet Take 40 mg by mouth every evening.     . folic acid (FOLVITE) 1 MG tablet Take 1 mg by mouth daily.    . hydroxyurea (HYDREA) 500 MG capsule TAKE ONE TABLET BY MOUTH EVERY MORNING 30 capsule 3  . levothyroxine (SYNTHROID) 50 MCG tablet Take 50 mcg by mouth every morning.    . Melatonin  5 MG TABS Take 5 mg by mouth at bedtime.    Marland Kitchen omeprazole (PRILOSEC) 40 MG capsule Take 40 mg by mouth every evening.     . potassium chloride SA (KLOR-CON) 20 MEQ tablet TAKE ONE TABLET BY MOUTH ONCE DAILY 30 tablet 1  . predniSONE (DELTASONE) 5 MG tablet Take 5 mg by mouth daily.    Marland Kitchen venlafaxine XR (EFFEXOR-XR) 150 MG 24 hr capsule Take 150 mg by mouth every evening.      No current facility-administered medications for this visit.    REVIEW OF SYSTEMS:   A 10+ POINT REVIEW OF SYSTEMS WAS OBTAINED including neurology, dermatology, psychiatry, cardiac, respiratory, lymph, extremities, GI, GU, Musculoskeletal, constitutional, breasts, reproductive, HEENT.  All pertinent positives are noted in the HPI.  All others are negative.   PHYSICAL EXAMINATION: ECOG FS:2 - Symptomatic, <50% confined to bed  Vitals:   07/12/20 1447  BP: (!) 160/65  Pulse: 99  Resp: 15  Temp: 98 F (36.7 C)  SpO2: 99%   Wt Readings from Last 3 Encounters:  07/12/20 149 lb 1.6 oz (67.6 kg)  05/31/20 150 lb (68 kg)  02/26/20 152 lb 8 oz (69.2 kg)   Body mass index is 28.17 kg/m.    GENERAL:alert, in no acute distress and comfortable SKIN: no acute rashes, no significant lesions EYES: conjunctiva are pink and non-injected, sclera anicteric OROPHARYNX: MMM, no exudates, no oropharyngeal erythema or ulceration NECK: supple, no JVD LYMPH:  no palpable lymphadenopathy in the cervical, axillary or inguinal regions LUNGS: clear to auscultation b/l with normal respiratory effort HEART: regular rate & rhythm ABDOMEN:  normoactive bowel sounds , non tender, not distended. No palpable hepatosplenomegaly.  Extremity: no pedal edema PSYCH: alert & oriented x 3 with fluent speech NEURO: no focal motor/sensory deficits  LABORATORY DATA:  I have reviewed the data as listed  . CBC Latest Ref Rng & Units 07/12/2020 05/31/2020 02/26/2020  WBC 4.0 - 10.5 K/uL 7.7 8.4 11.1(H)  Hemoglobin 12.0 - 15.0 g/dL 9.0(L) 8.7(L)  11.1(L)  Hematocrit 36.0 - 46.0 % 30.2(L) 28.5(L) 34.5(L)  Platelets 150 - 400 K/uL 422(H) 442(H) 374    . CMP Latest Ref Rng & Units 07/12/2020 05/31/2020 02/26/2020  Glucose 70 - 99 mg/dL 97 114(H) 107(H)  BUN 8 - 23 mg/dL 24(H) 30(H) 16  Creatinine 0.44 - 1.00 mg/dL 1.73(H) 1.84(H) 1.88(H)  Sodium 135 - 145 mmol/L 139 140 138  Potassium 3.5 - 5.1 mmol/L 4.1 4.6 3.8  Chloride 98 - 111 mmol/L 105 106 102  CO2 22 -  32 mmol/L 24 22 23   Calcium 8.9 - 10.3 mg/dL 8.8(L) 8.9 10.1  Total Protein 6.5 - 8.1 g/dL 7.3 7.3 7.5  Total Bilirubin 0.3 - 1.2 mg/dL 0.4 0.3 0.6  Alkaline Phos 38 - 126 U/L 113 105 114  AST 15 - 41 U/L 17 14(L) 16  ALT 0 - 44 U/L 13 10 10            RADIOGRAPHIC STUDIES: I have personally reviewed the radiological images as listed and agreed with the findings in the report. No results found. CT chest 09/19/2017: IMPRESSION: 1. Moderate mediastinal and right supraclavicular lymphadenopathy, increased. New mild bilateral axillary and bilateral hilar lymphadenopathy. Findings are worrisome for a malignant process such as a lymphoproliferative disorder or metastatic nodal disease. The right supraclavicular node may be (but not definitely) accessible for percutaneous biopsy. 2. Numerous similar-appearing subsolid spiculated pulmonary nodules scattered throughout both lungs, upper lobe predominant, largest 1.6 cm, stable to mildly increased in size and slightly increased in density in the interval. These are indeterminate, with multifocal lung adenocarcinoma on the differential. 3. Several new subcentimeter hypodense splenic lesions. Solitary small hypodense posterior right liver lobe lesion. These lesions are indeterminate, with lymphoproliferative disorder or metastatic disease not excluded. Options include further characterization with MRI abdomen without and with IV contrast or short-term follow-up CT abdomen with IV contrast in 3 months. 4. Chronic findings  include: Left main and 3 vessel coronary atherosclerosis. Small hiatal hernia. Cholelithiasis.  Aortic Atherosclerosis (ICD10-I70.0).  Electronically Signed   By: Ilona Sorrel M.D.   On: 09/19/2017 11:54   ASSESSMENT & PLAN:   75 y.o. female with  1. Sarcoidosis Presented with Generalized lymphadenopathy with multiple pulmonary nodules CT Chest 09/19/2017: Moderate mediastinal and right supraclavicular lymphadenopathy, increased. New mild bilateral axillary and bilateral hilar lymphadenopathy. Findings are worrisome for a malignant process such as a lymphoproliferative disorder or metastatic nodal disease. The right supraclavicular node may be (but not definitely) accessible for percutaneous biopsy. 2. Numerous similar-appearing subsolid spiculated pulmonary nodules scattered throughout both lungs, upper lobe predominant, largest 1.6 cm, stable to mildly increased in size and slightly increased in density in the interval. These are indeterminate, with multifocal lung adenocarcinoma on the differential.  12/26/17 CT C/A/P revealed interval response to steroids in LNadenopathy   07/17/18 CT C/A/P revealed Continue improvement in mediastinal adenopathy. Lymph nodes are now not pathologic by size criteria. 2. Continued improvement in nodular and ground-glass densities particularly in the RIGHT upper lobe. 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  2. Breast - lesion - Bx -- granulomatous inflammation -consistent with sarcoidosis.  3. Skin rash on nose - Bx consistent with sarcoidosis.  4. H/o  Iron deficiency - no significant anemia . 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 -Continue the  Hydroxyurea 500 mg 2x per week. The pt has no prohibitive toxicities at this time. -Advised the anemia is due to genetic mutation and scar tissue development in bone marrow -Advised the regimen is controlling platelets while not worsening anemia.  -Recommended that Shanon Brow will be used if the current treatment makes pt too anemic. -Discussed the prior iron deficiency and will order additional lab today if possible. -Recommended pt take 2000 IU Vitamin D daily -Rx Iron Polysaccharide -Continue 1x daily baby Aspirin -Will see back in 2 months   FOLLOW UP: RTC with Dr Irene Limbo with labs in 2 months   The total time spent in the appt was 20 minutes and more than 50% was on counseling and direct patient cares.  All of the patient's questions were answered with apparent satisfaction. The patient knows to call the clinic with any problems, questions or concerns.   Sullivan Lone MD [place] AAHIVMS [facility] Hematology/Oncology Physician [facility]  (Office):       [phone] (Work cell):  [phone] (Fax):           [phone]  07/12/2020 3:27 PM  I, Yevette Edwards, am acting as a scribe for Dr. Sullivan Lone.   .I have reviewed the above documentation for accuracy and completeness, and I agree with the above. Brunetta Genera MD

## 2020-07-13 ENCOUNTER — Telehealth: Payer: Self-pay | Admitting: Hematology

## 2020-07-13 NOTE — Telephone Encounter (Signed)
Scheduled appointments per 1/4 los. Spoke to patient who is aware of appointment date and time.

## 2020-07-19 DIAGNOSIS — I10 Essential (primary) hypertension: Secondary | ICD-10-CM | POA: Diagnosis not present

## 2020-07-19 DIAGNOSIS — D869 Sarcoidosis, unspecified: Secondary | ICD-10-CM | POA: Diagnosis not present

## 2020-07-19 DIAGNOSIS — J8410 Pulmonary fibrosis, unspecified: Secondary | ICD-10-CM | POA: Diagnosis not present

## 2020-07-19 DIAGNOSIS — E785 Hyperlipidemia, unspecified: Secondary | ICD-10-CM | POA: Diagnosis not present

## 2020-07-19 DIAGNOSIS — I70209 Unspecified atherosclerosis of native arteries of extremities, unspecified extremity: Secondary | ICD-10-CM | POA: Diagnosis not present

## 2020-07-19 DIAGNOSIS — E663 Overweight: Secondary | ICD-10-CM | POA: Diagnosis not present

## 2020-07-19 DIAGNOSIS — D84821 Immunodeficiency due to drugs: Secondary | ICD-10-CM | POA: Diagnosis not present

## 2020-07-19 DIAGNOSIS — Z008 Encounter for other general examination: Secondary | ICD-10-CM | POA: Diagnosis not present

## 2020-07-19 DIAGNOSIS — G8929 Other chronic pain: Secondary | ICD-10-CM | POA: Diagnosis not present

## 2020-07-19 DIAGNOSIS — R69 Illness, unspecified: Secondary | ICD-10-CM | POA: Diagnosis not present

## 2020-07-21 DIAGNOSIS — E7800 Pure hypercholesterolemia, unspecified: Secondary | ICD-10-CM | POA: Diagnosis not present

## 2020-07-21 DIAGNOSIS — M179 Osteoarthritis of knee, unspecified: Secondary | ICD-10-CM | POA: Diagnosis not present

## 2020-07-21 DIAGNOSIS — K219 Gastro-esophageal reflux disease without esophagitis: Secondary | ICD-10-CM | POA: Diagnosis not present

## 2020-07-21 DIAGNOSIS — E039 Hypothyroidism, unspecified: Secondary | ICD-10-CM | POA: Diagnosis not present

## 2020-07-21 DIAGNOSIS — I4891 Unspecified atrial fibrillation: Secondary | ICD-10-CM | POA: Diagnosis not present

## 2020-07-21 DIAGNOSIS — R69 Illness, unspecified: Secondary | ICD-10-CM | POA: Diagnosis not present

## 2020-07-21 DIAGNOSIS — M1990 Unspecified osteoarthritis, unspecified site: Secondary | ICD-10-CM | POA: Diagnosis not present

## 2020-07-21 DIAGNOSIS — N183 Chronic kidney disease, stage 3 unspecified: Secondary | ICD-10-CM | POA: Diagnosis not present

## 2020-07-21 DIAGNOSIS — N184 Chronic kidney disease, stage 4 (severe): Secondary | ICD-10-CM | POA: Diagnosis not present

## 2020-08-16 DIAGNOSIS — D869 Sarcoidosis, unspecified: Secondary | ICD-10-CM | POA: Diagnosis not present

## 2020-08-16 DIAGNOSIS — M25461 Effusion, right knee: Secondary | ICD-10-CM | POA: Diagnosis not present

## 2020-08-16 DIAGNOSIS — M25462 Effusion, left knee: Secondary | ICD-10-CM | POA: Diagnosis not present

## 2020-08-16 DIAGNOSIS — Z1589 Genetic susceptibility to other disease: Secondary | ICD-10-CM | POA: Diagnosis not present

## 2020-08-16 DIAGNOSIS — Z7952 Long term (current) use of systemic steroids: Secondary | ICD-10-CM | POA: Diagnosis not present

## 2020-08-16 DIAGNOSIS — D473 Essential (hemorrhagic) thrombocythemia: Secondary | ICD-10-CM | POA: Diagnosis not present

## 2020-08-16 DIAGNOSIS — H348112 Central retinal vein occlusion, right eye, stable: Secondary | ICD-10-CM | POA: Diagnosis not present

## 2020-08-16 DIAGNOSIS — N1832 Chronic kidney disease, stage 3b: Secondary | ICD-10-CM | POA: Diagnosis not present

## 2020-08-16 DIAGNOSIS — I7300 Raynaud's syndrome without gangrene: Secondary | ICD-10-CM | POA: Diagnosis not present

## 2020-08-16 DIAGNOSIS — R69 Illness, unspecified: Secondary | ICD-10-CM | POA: Diagnosis not present

## 2020-08-18 ENCOUNTER — Other Ambulatory Visit: Payer: Self-pay | Admitting: Hematology

## 2020-08-18 DIAGNOSIS — D473 Essential (hemorrhagic) thrombocythemia: Secondary | ICD-10-CM

## 2020-08-18 DIAGNOSIS — E876 Hypokalemia: Secondary | ICD-10-CM

## 2020-08-18 NOTE — Telephone Encounter (Signed)
Please review for refill.  

## 2020-08-23 ENCOUNTER — Other Ambulatory Visit: Payer: Self-pay | Admitting: Hematology

## 2020-08-23 DIAGNOSIS — E876 Hypokalemia: Secondary | ICD-10-CM

## 2020-08-23 DIAGNOSIS — D473 Essential (hemorrhagic) thrombocythemia: Secondary | ICD-10-CM

## 2020-08-25 DIAGNOSIS — R69 Illness, unspecified: Secondary | ICD-10-CM | POA: Diagnosis not present

## 2020-08-25 DIAGNOSIS — M1990 Unspecified osteoarthritis, unspecified site: Secondary | ICD-10-CM | POA: Diagnosis not present

## 2020-08-25 DIAGNOSIS — M179 Osteoarthritis of knee, unspecified: Secondary | ICD-10-CM | POA: Diagnosis not present

## 2020-08-25 DIAGNOSIS — N184 Chronic kidney disease, stage 4 (severe): Secondary | ICD-10-CM | POA: Diagnosis not present

## 2020-08-25 DIAGNOSIS — N183 Chronic kidney disease, stage 3 unspecified: Secondary | ICD-10-CM | POA: Diagnosis not present

## 2020-08-25 DIAGNOSIS — K219 Gastro-esophageal reflux disease without esophagitis: Secondary | ICD-10-CM | POA: Diagnosis not present

## 2020-08-25 DIAGNOSIS — E7800 Pure hypercholesterolemia, unspecified: Secondary | ICD-10-CM | POA: Diagnosis not present

## 2020-08-25 DIAGNOSIS — I4891 Unspecified atrial fibrillation: Secondary | ICD-10-CM | POA: Diagnosis not present

## 2020-08-25 DIAGNOSIS — E039 Hypothyroidism, unspecified: Secondary | ICD-10-CM | POA: Diagnosis not present

## 2020-08-30 ENCOUNTER — Other Ambulatory Visit: Payer: Self-pay | Admitting: Hematology

## 2020-08-30 DIAGNOSIS — D473 Essential (hemorrhagic) thrombocythemia: Secondary | ICD-10-CM

## 2020-08-31 ENCOUNTER — Telehealth: Payer: Self-pay | Admitting: *Deleted

## 2020-08-31 NOTE — Telephone Encounter (Signed)
Patient called. Stated she had lost phone for a month and just found it. There was a message from someone about her Ferrex and Vit D and she thought they wanted her to stop it - but the pharmacy was calling to tell her about a refill, or maybe the pharmacy was telling her she needed to stop it? States this office should call her on her home number [phone] and not use her cell (home number is starred in chart) and especially not to use her cell to send her emails.  Per Dr. Irene Limbo notes from last appt 07/12/20, patient is to continue iron and vitamin d - prescriptions with refills sent to patient's pharmacy on 07/12/20. Patient is to RTC in 2 months and has appt on 3/7 for labs and appt with Dr. Irene Limbo.   Informed patient of all this information and that there are no notes in chart of phone calls with different directions for these medications. Patient verbalized understanding and states again that this office should not call her cell number. She states she will continue taking these meds as she has not stopped them and will discuss with Dr. Irene Limbo at appt on 09/12/20.

## 2020-09-11 NOTE — Progress Notes (Signed)
HEMATOLOGY/ONCOLOGY CLINIC NOTE  Date of Service: 09/12/20    Patient Care Team: Lawerance Cruel, MD as PCP - General (Family Medicine) [name], MD as PCP - Cardiology (Cardiology)  CHIEF COMPLAINTS/PURPOSE OF CONSULTATION:  F/u for mx of Jak2 MPN  HISTORY OF PRESENTING ILLNESS:   Alexis Murphy is a wonderful 75 y.o. 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 The pt reports that she is doing well overall. She has seen dermatology and rheumatology with work up of her sarcoidosis. She is seeing Dr Christinia Gully in one month in Pulmonology.   She notes that her skin changes first appeared 10 years ago.   The pt reports that her recent nose bx with [facility] a week ago revealed sarcoidosis and her right foot bx revealed stasis dermatitis. She has been diagnosed with sarcoidosis before this. She began a 20mg  prednisone 35-month-taper, three weeks ago with Dr. [name], and is also taking Plaquenil for her sarcoidosis. She had a CT on 09/19/17 to properly work up her sarcoidosis diagnosis.   She notes that prior to taking steroids her feet were very painful and also had some neuropathy. She also had night sweats and leg and ankle swelling but did not have fatigue or shortness of breath. These positive symptoms have resolved after beginning Prednisone.   She denies any breast symptoms. She has a MMG on 08/05/2017 which showed : IMPRESSION: New suspicious left breast mass 7:00 position retroareolar measuring 0.6 cm on ultrasound. Malignancy cannot be excluded. The mass does have similar imaging appearance to the previously biopsied left breast mass (January 2017, which demonstrated granulomatous inflammation and no evidence of malignancy at pathology.) The possibility of a second area of granulomatous inflammation is considered.  Borderline diffuse cortical thickening of a left axillary  lymph node. On review of the patient's prior CT a of the chest, patient does have mild diffuse lymphadenopathy in the thorax. At this time, axillary lymph node biopsy is not felt warranted.  The patient was given a copy of her pathology report from her left breast biopsy from January 2017. I told her she may want to share this information with her rheumatologist.  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   Of note prior to the patient's visit today, pt has had CT Chest completed on 09/19/17 with results revealing 1. Moderate mediastinal and right supraclavicular lymphadenopathy, increased. New mild bilateral axillary and bilateral hilar lymphadenopathy. Findings are worrisome for a malignant process such as a lymphoproliferative disorder or metastatic nodal disease. The right supraclavicular node may be (but not definitely) accessible for percutaneous biopsy. 2. Numerous similar-appearing subsolid spiculated pulmonary nodules scattered throughout both lungs, upper lobe predominant, largest 1.6 cm, stable to mildly increased in size and slightly increased in density in the interval. These are indeterminate, with multifocal lung adenocarcinoma on the differential. 3. Several new subcentimeter hypodense splenic lesions. Solitary small hypodense posterior right liver lobe lesion. These lesions are indeterminate, with lymphoproliferative disorder or metastatic disease not excluded. Options include further characterization with MRI abdomen without and with IV contrast or short-term follow-up CT abdomen with IV contrast in 3 months. 4. Chronic findings include: Left main and 3 vessel coronary atherosclerosis. Small hiatal hernia. Cholelithiasis.   Most recent lab results (03/10/17) of CBC  is as follows: all values are WNL except for WBC at 17.5k, MCV at 75.8, MCH at 25.2, RDW at 17.6,  Platelets at 804k.  On review of systems, pt reports dry mouth, decreased leg swelling, and  denies tingling, numbness in her hands or feet, dry eyes, problems breathing, cough, SOB, CP, nose bleeds, fatigue, abdominal pains, unexpected weight loss, fevers, chills, night sweats.   On PMHx the pt reports sarcoidosis, Bell's palsy twice.  On Social Hx the pt reports that she smoked cigarettes for 45 years. She quit smoking cigarettes 10 years ago but continues to smoke marijuana occasionally.  On Family Hx the pt reports maternal lupus.  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 The patient's last visit with [place] was on 07/12/2020. The pt reports that she is doing well overall.  The pt reports that she has been doing very well. She notes she received an X-ray of her knee and they saw many calcium deposits in both joints and soft tissue. The pt notes much pain in her knees. The pt notes one spot on her left eye. This has been there for several weeks. The pt notes she has been taking Iron Polysaccharide once daily and the B Complex BID. The pt notes that she drinks water all day and this keeps her awake at night as she frequently wakes up many times to urinate.   Lab results today 09/12/2020 of CBC w/diff and CMP is as follows: all values are WNL except for Potassium of 3.3, Glucose of 100, BUN of 27, Cratinine of 2.03, Calcium of 8.6, Total Bilirubin of 0.2, GFR est of 25, RBC of 3.73, Hgb of 9.1, HCT of 30.5, MCH of 24.4, MCHC of 29.8, RDW of 16.6, Plt of 422K. 09/12/2020 LDH of 199. 09/12/2020 Iron of 20, Sat Ratio of 5. 09/12/2020 Ferritin of 12. 09/12/2020 Erythropoietin pending.  On review of systems, pt denies constipation, diarrhea, fevers, chills, nausea, abdominal pain, back pain, leg swelling, and any other symptoms.   MEDICAL HISTORY:  Past Medical History:  Diagnosis Date  . Arthritis   . Hyperlipidemia   . Sarcoidosis     SURGICAL HISTORY: Past Surgical History:  Procedure Laterality Date  . ABDOMINAL HYSTERECTOMY     . BREAST BIOPSY    . BREAST SURGERY    . REDUCTION MAMMAPLASTY      SOCIAL HISTORY: Social History   Socioeconomic History  . Marital status: Widowed    Spouse name: Not on file  . Number of children: Not on file  . Years of education: Not on file  . Highest education level: Not on file  Occupational History  . Not on file  Tobacco Use  . Smoking status: Former Smoker    Packs/day: 1.00    Years: 45.00    Pack years: 45.00    Types: Cigarettes    Quit date: 10/15/2007    Years since quitting: 12.9  . Smokeless tobacco: Never Used  Vaping Use  . Vaping Use: Never used  Substance and Sexual Activity  . Alcohol use: Yes  . Drug use: No  . Sexual activity: Not on file  Other Topics Concern  . Not on file  Social History Narrative  . Not on file   Social Determinants of Health   Financial Resource Strain: Not on file  Food Insecurity: Not on file  Transportation Needs: Not on file  Physical Activity: Not on file  Stress: Not on file  Social Connections: Not on file  Intimate Partner Violence: Not on file    FAMILY HISTORY: Family History  Problem  Relation Age of Onset  . Heart disease Father   . Heart disease Brother     ALLERGIES:  is allergic to penicillins and codeine.  MEDICATIONS:  Current Outpatient Medications  Medication Sig Dispense Refill  . acetaminophen (TYLENOL) 325 MG tablet Take 1 tablet (325 mg total) by mouth every 6 (six) hours. (Patient taking differently: Take 325 mg by mouth every 6 (six) hours as needed for headache. ) 30 tablet 0  . amLODipine (NORVASC) 5 MG tablet Take 5 mg by mouth every evening.     Marland Kitchen aspirin EC 81 MG tablet Take 81 mg by mouth daily.    Marland Kitchen atorvastatin (LIPITOR) 40 MG tablet Take 40 mg by mouth every evening.     . cholecalciferol (VITAMIN D3) 25 MCG (1000 UNIT) tablet Take 2 tablets (2,000 Units total) by mouth daily. 60 tablet 5  . folic acid (FOLVITE) 1 MG tablet Take 1 mg by mouth daily.    . hydroxyurea  (HYDREA) 500 MG capsule Take 1 capsule (500 mg total) by mouth 2 (two) times a week. May take with food to minimize GI side effects. 30 capsule 0  . iron polysaccharides (NIFEREX) 150 MG capsule Take 1 capsule (150 mg total) by mouth daily. 30 capsule 2  . levothyroxine (SYNTHROID) 50 MCG tablet Take 50 mcg by mouth every morning.    . Melatonin 5 MG TABS Take 5 mg by mouth at bedtime.    Marland Kitchen omeprazole (PRILOSEC) 40 MG capsule Take 40 mg by mouth every evening.     . potassium chloride SA (KLOR-CON) 20 MEQ tablet Take 1 tablet (20 mEq total) by mouth daily. 30 tablet 1  . predniSONE (DELTASONE) 5 MG tablet Take 5 mg by mouth daily.    Marland Kitchen venlafaxine XR (EFFEXOR-XR) 150 MG 24 hr capsule Take 150 mg by mouth every evening.      No current facility-administered medications for this visit.    REVIEW OF SYSTEMS:   10 Point review of Systems was done is negative except as noted above.  PHYSICAL EXAMINATION: ECOG FS:2 - Symptomatic, <50% confined to bed  Vitals:   09/12/20 1452  BP: (!) 157/93  Pulse: 94  Resp: 17  Temp: 97.7 F (36.5 C)  SpO2: 98%   Wt Readings from Last 3 Encounters:  09/12/20 152 lb 4.8 oz (69.1 kg)  07/12/20 149 lb 1.6 oz (67.6 kg)  05/31/20 150 lb (68 kg)   Body mass index is 28.78 kg/m.    GENERAL:alert, in no acute distress and comfortable SKIN: no acute rashes, no significant lesions EYES: conjunctiva are pink and non-injected, sclera anicteric OROPHARYNX: MMM, no exudates, no oropharyngeal erythema or ulceration NECK: supple, no JVD LYMPH:  no palpable lymphadenopathy in the cervical, axillary or inguinal regions LUNGS: clear to auscultation b/l with normal respiratory effort HEART: regular rate & rhythm ABDOMEN:  normoactive bowel sounds , non tender, not distended. Extremity: no pedal edema PSYCH: alert & oriented x 3 with fluent speech NEURO: no focal motor/sensory deficits  LABORATORY DATA:  I have reviewed the data as listed  . CBC Latest Ref  Rng & Units 09/12/2020 07/12/2020 05/31/2020  WBC 4.0 - 10.5 K/uL 8.7 7.7 8.4  Hemoglobin 12.0 - 15.0 g/dL 9.1(L) 9.0(L) 8.7(L)  Hematocrit 36.0 - 46.0 % 30.5(L) 30.2(L) 28.5(L)  Platelets 150 - 400 K/uL 422(H) 422(H) 442(H)    . CMP Latest Ref Rng & Units 09/12/2020 07/12/2020 05/31/2020  Glucose 70 - 99 mg/dL 100(H) 97 114(H)  BUN  8 - 23 mg/dL 27(H) 24(H) 30(H)  Creatinine 0.44 - 1.00 mg/dL 2.03(H) 1.73(H) 1.84(H)  Sodium 135 - 145 mmol/L 137 139 140  Potassium 3.5 - 5.1 mmol/L 3.3(L) 4.1 4.6  Chloride 98 - 111 mmol/L 102 105 106  CO2 22 - 32 mmol/L 26 24 22   Calcium 8.9 - 10.3 mg/dL 8.6(L) 8.8(L) 8.9  Total Protein 6.5 - 8.1 g/dL 6.9 7.3 7.3  Total Bilirubin 0.3 - 1.2 mg/dL 0.2(L) 0.4 0.3  Alkaline Phos 38 - 126 U/L 104 113 105  AST 15 - 41 U/L 16 17 14(L)  ALT 0 - 44 U/L 11 13 10            RADIOGRAPHIC STUDIES: I have personally reviewed the radiological images as listed and agreed with the findings in the report. No results found. CT chest 09/19/2017: IMPRESSION: 1. Moderate mediastinal and right supraclavicular lymphadenopathy, increased. New mild bilateral axillary and bilateral hilar lymphadenopathy. Findings are worrisome for a malignant process such as a lymphoproliferative disorder or metastatic nodal disease. The right supraclavicular node may be (but not definitely) accessible for percutaneous biopsy. 2. Numerous similar-appearing subsolid spiculated pulmonary nodules scattered throughout both lungs, upper lobe predominant, largest 1.6 cm, stable to mildly increased in size and slightly increased in density in the interval. These are indeterminate, with multifocal lung adenocarcinoma on the differential. 3. Several new subcentimeter hypodense splenic lesions. Solitary small hypodense posterior right liver lobe lesion. These lesions are indeterminate, with lymphoproliferative disorder or metastatic disease not excluded. Options include further characterization  with MRI abdomen without and with IV contrast or short-term follow-up CT abdomen with IV contrast in 3 months. 4. Chronic findings include: Left main and 3 vessel coronary atherosclerosis. Small hiatal hernia. Cholelithiasis.  Aortic Atherosclerosis (ICD10-I70.0).  Electronically Signed   By: Ilona Sorrel M.D.   On: 09/19/2017 11:54   ASSESSMENT & PLAN:   75 y.o. female with  1. Sarcoidosis Presented with Generalized lymphadenopathy with multiple pulmonary nodules CT Chest 09/19/2017: Moderate mediastinal and right supraclavicular lymphadenopathy, increased. New mild bilateral axillary and bilateral hilar lymphadenopathy. Findings are worrisome for a malignant process such as a lymphoproliferative disorder or metastatic nodal disease. The right supraclavicular node may be (but not definitely) accessible for percutaneous biopsy. 2. Numerous similar-appearing subsolid spiculated pulmonary nodules scattered throughout both lungs, upper lobe predominant, largest 1.6 cm, stable to mildly increased in size and slightly increased in density in the interval. These are indeterminate, with multifocal lung adenocarcinoma on the differential.  12/26/17 CT C/A/P revealed interval response to steroids in LNadenopathy   07/17/18 CT C/A/P revealed Continue improvement in mediastinal adenopathy. Lymph nodes are now not pathologic by size criteria. 2. Continued improvement in nodular and ground-glass densities particularly in the RIGHT upper lobe. 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  2. Breast - lesion - Bx -- granulomatous inflammation -consistent with sarcoidosis.  3. Skin rash on nose - Bx consistent with sarcoidosis.  4. H/o  Iron deficiency - no significant anemia . 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 Iron deficiency anemia  PLAN: -Discussed pt labwork today, 09/12/2020; anemic, iron deficient. Other chemistries stable. Ferritin of 12. -Recommended warm mild soap gently wipes on eyelids 2-3x daily. Advised pt the spot could be very early stye or a granuloma. -Advised pt her anemia is primarily due to her iron deficiency and her chronic kidney disease. Discussed chronic kidney disease needing increased iron levels than normal. -Advised pt she may be dehydrated due to labs.  -Advised pt to see a Nephrologist for her chronic kidney disease. Will give referral at [facility]. -Advised pt to increase Iron Polysaccharide from once daily to BID due to pt's toleration of this. Discussed option of IV Iron replacement. The pt is agreeable to this option. -Will not adjust Hydroxyurea dosage at this time due to anemia. -Continue the Hydroxyurea 500 mg 2x per week. The pt has no prohibitive toxicities at this time. -Continue 2000 IU Vitamin D daily -Continue 1x daily baby Aspirin -Will get IV Iron in 1-2 weeks. -Will see back in 3 months with labs. -Rx Potassium pills.   FOLLOW UP: IV Injectafer weekly x 2 doses starting in 1 week RTC with Dr Irene Limbo with labs in 3 months   The total time spent in the appointment was 20 minutes and more than 50% was on counseling and direct patient cares.   All of the patient's questions were answered with apparent satisfaction. The patient knows to call the clinic with any problems, questions or concerns.   Sullivan Lone MD [place] [facility]IV[facility]  [facility] Hematology/Oncology Physician [facility]  (Office):       [phone] (Work cell):  [phone] (Fax):           [phone]  09/12/2020 3:26 PM  I, Reinaldo Raddle, am acting as scribe for Dr. Sullivan Lone, MD.     .I have reviewed the above documentation for accuracy and completeness, and I agree with the above. Brunetta Genera MD

## 2020-09-12 ENCOUNTER — Inpatient Hospital Stay: Payer: Medicare HMO | Attending: Hematology

## 2020-09-12 ENCOUNTER — Inpatient Hospital Stay: Payer: Medicare HMO | Admitting: Hematology

## 2020-09-12 ENCOUNTER — Other Ambulatory Visit: Payer: Self-pay

## 2020-09-12 VITALS — BP 157/93 | HR 94 | Temp 97.7°F | Resp 17 | Ht 61.0 in | Wt 152.3 lb

## 2020-09-12 DIAGNOSIS — Z79899 Other long term (current) drug therapy: Secondary | ICD-10-CM | POA: Insufficient documentation

## 2020-09-12 DIAGNOSIS — Z1589 Genetic susceptibility to other disease: Secondary | ICD-10-CM

## 2020-09-12 DIAGNOSIS — D649 Anemia, unspecified: Secondary | ICD-10-CM

## 2020-09-12 DIAGNOSIS — D471 Chronic myeloproliferative disease: Secondary | ICD-10-CM

## 2020-09-12 DIAGNOSIS — D473 Essential (hemorrhagic) thrombocythemia: Secondary | ICD-10-CM

## 2020-09-12 DIAGNOSIS — E876 Hypokalemia: Secondary | ICD-10-CM

## 2020-09-12 DIAGNOSIS — Z7982 Long term (current) use of aspirin: Secondary | ICD-10-CM | POA: Diagnosis not present

## 2020-09-12 DIAGNOSIS — N189 Chronic kidney disease, unspecified: Secondary | ICD-10-CM | POA: Diagnosis not present

## 2020-09-12 DIAGNOSIS — D509 Iron deficiency anemia, unspecified: Secondary | ICD-10-CM

## 2020-09-12 DIAGNOSIS — Z87891 Personal history of nicotine dependence: Secondary | ICD-10-CM | POA: Diagnosis not present

## 2020-09-12 DIAGNOSIS — D863 Sarcoidosis of skin: Secondary | ICD-10-CM | POA: Insufficient documentation

## 2020-09-12 LAB — CMP (CANCER CENTER ONLY)
ALT: 11 U/L (ref 0–44)
AST: 16 U/L (ref 15–41)
Albumin: 4 g/dL (ref 3.5–5.0)
Alkaline Phosphatase: 104 U/L (ref 38–126)
Anion gap: 9 (ref 5–15)
BUN: 27 mg/dL — ABNORMAL HIGH (ref 8–23)
CO2: 26 mmol/L (ref 22–32)
Calcium: 8.6 mg/dL — ABNORMAL LOW (ref 8.9–10.3)
Chloride: 102 mmol/L (ref 98–111)
Creatinine: 2.03 mg/dL — ABNORMAL HIGH (ref 0.44–1.00)
GFR, Estimated: 25 mL/min — ABNORMAL LOW (ref >60)
Glucose, Bld: 100 mg/dL — ABNORMAL HIGH (ref 70–99)
Potassium: 3.3 mmol/L — ABNORMAL LOW (ref 3.5–5.1)
Sodium: 137 mmol/L (ref 135–145)
Total Bilirubin: 0.2 mg/dL — ABNORMAL LOW (ref 0.3–1.2)
Total Protein: 6.9 g/dL (ref 6.5–8.1)

## 2020-09-12 LAB — CBC WITH DIFFERENTIAL/PLATELET
Abs Immature Granulocytes: 0.05 10*3/uL (ref 0.00–0.07)
Basophils Absolute: 0.1 10*3/uL (ref 0.0–0.1)
Basophils Relative: 1 %
Eosinophils Absolute: 0.2 10*3/uL (ref 0.0–0.5)
Eosinophils Relative: 2 %
HCT: 30.5 % — ABNORMAL LOW (ref 36.0–46.0)
Hemoglobin: 9.1 g/dL — ABNORMAL LOW (ref 12.0–15.0)
Immature Granulocytes: 1 %
Lymphocytes Relative: 19 %
Lymphs Abs: 1.7 10*3/uL (ref 0.7–4.0)
MCH: 24.4 pg — ABNORMAL LOW (ref 26.0–34.0)
MCHC: 29.8 g/dL — ABNORMAL LOW (ref 30.0–36.0)
MCV: 81.8 fL (ref 80.0–100.0)
Monocytes Absolute: 0.8 10*3/uL (ref 0.1–1.0)
Monocytes Relative: 10 %
Neutro Abs: 5.9 10*3/uL (ref 1.7–7.7)
Neutrophils Relative %: 67 %
Platelets: 422 10*3/uL — ABNORMAL HIGH (ref 150–400)
RBC: 3.73 MIL/uL — ABNORMAL LOW (ref 3.87–5.11)
RDW: 16.6 % — ABNORMAL HIGH (ref 11.5–15.5)
WBC: 8.7 10*3/uL (ref 4.0–10.5)
nRBC: 0 % (ref 0.0–0.2)

## 2020-09-12 LAB — IRON AND TIBC
Iron: 20 ug/dL — ABNORMAL LOW (ref 41–142)
Saturation Ratios: 5 % — ABNORMAL LOW (ref 21–57)
TIBC: 393 ug/dL (ref 236–444)
UIBC: 373 ug/dL (ref 120–384)

## 2020-09-12 LAB — LACTATE DEHYDROGENASE: LDH: 199 U/L — ABNORMAL HIGH (ref 98–192)

## 2020-09-12 LAB — FERRITIN: Ferritin: 12 ng/mL (ref 11–307)

## 2020-09-12 MED ORDER — POTASSIUM CHLORIDE CRYS ER 20 MEQ PO TBCR: 20.0000 meq | EXTENDED_RELEASE_TABLET | Freq: Every day | ORAL | 1 refills | Status: DC

## 2020-09-12 MED ORDER — HYDROXYUREA 500 MG PO CAPS: 500.0000 mg | ORAL_CAPSULE | ORAL | 0 refills | Status: DC

## 2020-09-13 LAB — ERYTHROPOIETIN: Erythropoietin: 13.3 m[IU]/mL (ref 2.6–18.5)

## 2020-09-16 ENCOUNTER — Other Ambulatory Visit: Payer: Self-pay | Admitting: Hematology

## 2020-09-16 ENCOUNTER — Telehealth: Payer: Self-pay | Admitting: Hematology

## 2020-09-16 DIAGNOSIS — D509 Iron deficiency anemia, unspecified: Secondary | ICD-10-CM

## 2020-09-16 NOTE — Telephone Encounter (Signed)
Scheduled per los, spoke with patient's relative. Patient will be notified of upcoming appointments.

## 2020-09-21 ENCOUNTER — Other Ambulatory Visit: Payer: Self-pay | Admitting: Hematology

## 2020-09-22 ENCOUNTER — Other Ambulatory Visit: Payer: Self-pay

## 2020-09-22 ENCOUNTER — Inpatient Hospital Stay: Payer: Medicare HMO

## 2020-09-22 VITALS — BP 143/56 | HR 87 | Temp 98.0°F | Resp 17

## 2020-09-22 DIAGNOSIS — D509 Iron deficiency anemia, unspecified: Secondary | ICD-10-CM

## 2020-09-22 DIAGNOSIS — Z1589 Genetic susceptibility to other disease: Secondary | ICD-10-CM | POA: Diagnosis not present

## 2020-09-22 DIAGNOSIS — Z7982 Long term (current) use of aspirin: Secondary | ICD-10-CM | POA: Diagnosis not present

## 2020-09-22 DIAGNOSIS — D863 Sarcoidosis of skin: Secondary | ICD-10-CM | POA: Diagnosis not present

## 2020-09-22 DIAGNOSIS — Z79899 Other long term (current) drug therapy: Secondary | ICD-10-CM | POA: Diagnosis not present

## 2020-09-22 DIAGNOSIS — Z87891 Personal history of nicotine dependence: Secondary | ICD-10-CM | POA: Diagnosis not present

## 2020-09-22 DIAGNOSIS — D471 Chronic myeloproliferative disease: Secondary | ICD-10-CM | POA: Diagnosis not present

## 2020-09-22 MED ORDER — LORATADINE 10 MG PO TABS
ORAL_TABLET | ORAL | Status: AC
  Filled 2020-09-22: qty 1

## 2020-09-22 MED ORDER — ACETAMINOPHEN 325 MG PO TABS
650.0000 mg | ORAL_TABLET | Freq: Once | ORAL | Status: AC
  Administered 2020-09-22: 650 mg via ORAL

## 2020-09-22 MED ORDER — SODIUM CHLORIDE 0.9 % IV SOLN
Freq: Once | INTRAVENOUS | Status: AC
  Filled 2020-09-22: qty 250

## 2020-09-22 MED ORDER — SODIUM CHLORIDE 0.9 % IV SOLN
300.0000 mg | Freq: Once | INTRAVENOUS | Status: AC
  Administered 2020-09-22: 300 mg via INTRAVENOUS
  Filled 2020-09-22: qty 10

## 2020-09-22 MED ORDER — LORATADINE 10 MG PO TABS
10.0000 mg | ORAL_TABLET | Freq: Once | ORAL | Status: AC
  Administered 2020-09-22: 10 mg via ORAL

## 2020-09-22 MED ORDER — ACETAMINOPHEN 325 MG PO TABS
ORAL_TABLET | ORAL | Status: AC
  Filled 2020-09-22: qty 2

## 2020-09-22 NOTE — Progress Notes (Signed)
Patient was observed for 30 minutes post iron infusion with no complaints. Upon discharge, vitals stable and patient in no distress.

## 2020-09-22 NOTE — Patient Instructions (Signed)

## 2020-09-27 ENCOUNTER — Inpatient Hospital Stay: Payer: Medicare HMO

## 2020-09-27 ENCOUNTER — Other Ambulatory Visit: Payer: Self-pay

## 2020-09-27 VITALS — BP 150/58 | HR 94 | Temp 97.5°F | Resp 18

## 2020-09-27 DIAGNOSIS — D471 Chronic myeloproliferative disease: Secondary | ICD-10-CM | POA: Diagnosis not present

## 2020-09-27 DIAGNOSIS — D509 Iron deficiency anemia, unspecified: Secondary | ICD-10-CM | POA: Diagnosis not present

## 2020-09-27 DIAGNOSIS — Z1589 Genetic susceptibility to other disease: Secondary | ICD-10-CM | POA: Diagnosis not present

## 2020-09-27 DIAGNOSIS — Z7982 Long term (current) use of aspirin: Secondary | ICD-10-CM | POA: Diagnosis not present

## 2020-09-27 DIAGNOSIS — Z79899 Other long term (current) drug therapy: Secondary | ICD-10-CM | POA: Diagnosis not present

## 2020-09-27 DIAGNOSIS — Z87891 Personal history of nicotine dependence: Secondary | ICD-10-CM | POA: Diagnosis not present

## 2020-09-27 DIAGNOSIS — D863 Sarcoidosis of skin: Secondary | ICD-10-CM | POA: Diagnosis not present

## 2020-09-27 MED ORDER — SODIUM CHLORIDE 0.9 % IV SOLN
300.0000 mg | Freq: Once | INTRAVENOUS | Status: AC
  Administered 2020-09-27: 300 mg via INTRAVENOUS
  Filled 2020-09-27: qty 10

## 2020-09-27 MED ORDER — ACETAMINOPHEN 325 MG PO TABS
ORAL_TABLET | ORAL | Status: AC
  Filled 2020-09-27: qty 2

## 2020-09-27 MED ORDER — LORATADINE 10 MG PO TABS
10.0000 mg | ORAL_TABLET | Freq: Once | ORAL | Status: AC
  Administered 2020-09-27: 10 mg via ORAL

## 2020-09-27 MED ORDER — SODIUM CHLORIDE 0.9 % IV SOLN
Freq: Once | INTRAVENOUS | Status: AC
  Filled 2020-09-27: qty 250

## 2020-09-27 MED ORDER — LORATADINE 10 MG PO TABS
ORAL_TABLET | ORAL | Status: AC
  Filled 2020-09-27: qty 1

## 2020-09-27 MED ORDER — ACETAMINOPHEN 325 MG PO TABS
650.0000 mg | ORAL_TABLET | Freq: Once | ORAL | Status: AC
  Administered 2020-09-27: 650 mg via ORAL

## 2020-09-27 NOTE — Patient Instructions (Signed)

## 2020-09-27 NOTE — Progress Notes (Signed)
Pt declined to stay for 30-minute post iron observation 

## 2020-09-29 ENCOUNTER — Other Ambulatory Visit: Payer: Medicare HMO

## 2020-09-29 ENCOUNTER — Telehealth: Payer: Self-pay | Admitting: Hematology

## 2020-09-29 NOTE — Telephone Encounter (Signed)
Scheduled iron infusion per sch msg. Called and spoke with patient. Confirmed date and time

## 2020-10-03 ENCOUNTER — Other Ambulatory Visit: Payer: Medicare HMO

## 2020-10-06 ENCOUNTER — Other Ambulatory Visit: Payer: Self-pay

## 2020-10-06 ENCOUNTER — Ambulatory Visit
Admission: RE | Admit: 2020-10-06 | Discharge: 2020-10-06 | Disposition: A | Discharge status: Home / Self Care | Payer: Medicare HMO | Source: Ambulatory Visit | Attending: Physician Assistant | Admitting: Physician Assistant

## 2020-10-06 DIAGNOSIS — M8589 Other specified disorders of bone density and structure, multiple sites: Secondary | ICD-10-CM | POA: Diagnosis not present

## 2020-10-06 DIAGNOSIS — Z780 Asymptomatic menopausal state: Secondary | ICD-10-CM | POA: Diagnosis not present

## 2020-10-06 DIAGNOSIS — Z7952 Long term (current) use of systemic steroids: Secondary | ICD-10-CM

## 2020-10-10 ENCOUNTER — Inpatient Hospital Stay: Payer: Medicare HMO | Attending: Hematology

## 2020-10-10 ENCOUNTER — Other Ambulatory Visit: Payer: Self-pay

## 2020-10-10 VITALS — BP 162/77 | HR 90 | Temp 98.2°F | Resp 16

## 2020-10-10 DIAGNOSIS — Z87891 Personal history of nicotine dependence: Secondary | ICD-10-CM | POA: Insufficient documentation

## 2020-10-10 DIAGNOSIS — Z79899 Other long term (current) drug therapy: Secondary | ICD-10-CM | POA: Insufficient documentation

## 2020-10-10 DIAGNOSIS — Z7982 Long term (current) use of aspirin: Secondary | ICD-10-CM | POA: Insufficient documentation

## 2020-10-10 DIAGNOSIS — D863 Sarcoidosis of skin: Secondary | ICD-10-CM | POA: Diagnosis not present

## 2020-10-10 DIAGNOSIS — Z1589 Genetic susceptibility to other disease: Secondary | ICD-10-CM | POA: Diagnosis not present

## 2020-10-10 DIAGNOSIS — D471 Chronic myeloproliferative disease: Secondary | ICD-10-CM | POA: Insufficient documentation

## 2020-10-10 DIAGNOSIS — D509 Iron deficiency anemia, unspecified: Secondary | ICD-10-CM

## 2020-10-10 MED ORDER — SODIUM CHLORIDE 0.9 % IV SOLN
Freq: Once | INTRAVENOUS | Status: AC
  Filled 2020-10-10: qty 250

## 2020-10-10 MED ORDER — LORATADINE 10 MG PO TABS
ORAL_TABLET | ORAL | Status: AC
  Filled 2020-10-10: qty 1

## 2020-10-10 MED ORDER — ACETAMINOPHEN 325 MG PO TABS
650.0000 mg | ORAL_TABLET | Freq: Once | ORAL | Status: AC
  Administered 2020-10-10: 650 mg via ORAL

## 2020-10-10 MED ORDER — SODIUM CHLORIDE 0.9 % IV SOLN
300.0000 mg | Freq: Once | INTRAVENOUS | Status: AC
  Administered 2020-10-10: 300 mg via INTRAVENOUS
  Filled 2020-10-10: qty 15

## 2020-10-10 MED ORDER — LORATADINE 10 MG PO TABS
10.0000 mg | ORAL_TABLET | Freq: Once | ORAL | Status: AC
  Administered 2020-10-10: 10 mg via ORAL

## 2020-10-10 MED ORDER — ACETAMINOPHEN 325 MG PO TABS
ORAL_TABLET | ORAL | Status: AC
  Filled 2020-10-10: qty 2

## 2020-10-10 NOTE — Patient Instructions (Signed)

## 2020-10-13 DIAGNOSIS — D869 Sarcoidosis, unspecified: Secondary | ICD-10-CM | POA: Diagnosis not present

## 2020-10-13 DIAGNOSIS — R69 Illness, unspecified: Secondary | ICD-10-CM | POA: Diagnosis not present

## 2020-10-13 DIAGNOSIS — M25461 Effusion, right knee: Secondary | ICD-10-CM | POA: Diagnosis not present

## 2020-10-13 DIAGNOSIS — I7300 Raynaud's syndrome without gangrene: Secondary | ICD-10-CM | POA: Diagnosis not present

## 2020-10-13 DIAGNOSIS — M25462 Effusion, left knee: Secondary | ICD-10-CM | POA: Diagnosis not present

## 2020-10-13 DIAGNOSIS — N1832 Chronic kidney disease, stage 3b: Secondary | ICD-10-CM | POA: Diagnosis not present

## 2020-10-13 DIAGNOSIS — D473 Essential (hemorrhagic) thrombocythemia: Secondary | ICD-10-CM | POA: Diagnosis not present

## 2020-10-13 DIAGNOSIS — Z7952 Long term (current) use of systemic steroids: Secondary | ICD-10-CM | POA: Diagnosis not present

## 2020-10-13 DIAGNOSIS — Z1589 Genetic susceptibility to other disease: Secondary | ICD-10-CM | POA: Diagnosis not present

## 2020-10-13 DIAGNOSIS — H348112 Central retinal vein occlusion, right eye, stable: Secondary | ICD-10-CM | POA: Diagnosis not present

## 2020-11-02 ENCOUNTER — Telehealth: Payer: Self-pay | Admitting: Hematology

## 2020-11-02 NOTE — Telephone Encounter (Signed)
R/S per 03/07 los Patient aware

## 2020-11-17 DIAGNOSIS — M25461 Effusion, right knee: Secondary | ICD-10-CM | POA: Diagnosis not present

## 2020-11-17 DIAGNOSIS — M25462 Effusion, left knee: Secondary | ICD-10-CM | POA: Diagnosis not present

## 2020-11-17 DIAGNOSIS — D473 Essential (hemorrhagic) thrombocythemia: Secondary | ICD-10-CM | POA: Diagnosis not present

## 2020-11-17 DIAGNOSIS — Z1589 Genetic susceptibility to other disease: Secondary | ICD-10-CM | POA: Diagnosis not present

## 2020-11-17 DIAGNOSIS — H348112 Central retinal vein occlusion, right eye, stable: Secondary | ICD-10-CM | POA: Diagnosis not present

## 2020-11-17 DIAGNOSIS — Z79899 Other long term (current) drug therapy: Secondary | ICD-10-CM | POA: Diagnosis not present

## 2020-11-17 DIAGNOSIS — N1832 Chronic kidney disease, stage 3b: Secondary | ICD-10-CM | POA: Diagnosis not present

## 2020-11-17 DIAGNOSIS — M8580 Other specified disorders of bone density and structure, unspecified site: Secondary | ICD-10-CM | POA: Diagnosis not present

## 2020-11-17 DIAGNOSIS — L52 Erythema nodosum: Secondary | ICD-10-CM | POA: Diagnosis not present

## 2020-11-17 DIAGNOSIS — Z7952 Long term (current) use of systemic steroids: Secondary | ICD-10-CM | POA: Diagnosis not present

## 2020-11-17 DIAGNOSIS — I7300 Raynaud's syndrome without gangrene: Secondary | ICD-10-CM | POA: Diagnosis not present

## 2020-11-17 DIAGNOSIS — D869 Sarcoidosis, unspecified: Secondary | ICD-10-CM | POA: Diagnosis not present

## 2020-11-18 ENCOUNTER — Other Ambulatory Visit: Payer: Self-pay | Admitting: Hematology

## 2020-11-21 DIAGNOSIS — M179 Osteoarthritis of knee, unspecified: Secondary | ICD-10-CM | POA: Diagnosis not present

## 2020-11-21 DIAGNOSIS — N183 Chronic kidney disease, stage 3 unspecified: Secondary | ICD-10-CM | POA: Diagnosis not present

## 2020-11-21 DIAGNOSIS — R69 Illness, unspecified: Secondary | ICD-10-CM | POA: Diagnosis not present

## 2020-11-21 DIAGNOSIS — E039 Hypothyroidism, unspecified: Secondary | ICD-10-CM | POA: Diagnosis not present

## 2020-11-21 DIAGNOSIS — K219 Gastro-esophageal reflux disease without esophagitis: Secondary | ICD-10-CM | POA: Diagnosis not present

## 2020-11-21 DIAGNOSIS — E7800 Pure hypercholesterolemia, unspecified: Secondary | ICD-10-CM | POA: Diagnosis not present

## 2020-11-21 DIAGNOSIS — I4891 Unspecified atrial fibrillation: Secondary | ICD-10-CM | POA: Diagnosis not present

## 2020-11-21 DIAGNOSIS — M1990 Unspecified osteoarthritis, unspecified site: Secondary | ICD-10-CM | POA: Diagnosis not present

## 2020-11-28 ENCOUNTER — Encounter: Payer: Self-pay | Admitting: Internal Medicine

## 2020-11-28 ENCOUNTER — Ambulatory Visit (INDEPENDENT_AMBULATORY_CARE_PROVIDER_SITE_OTHER): Payer: Medicare HMO

## 2020-11-28 ENCOUNTER — Other Ambulatory Visit: Payer: Self-pay

## 2020-11-28 ENCOUNTER — Ambulatory Visit: Payer: Medicare HMO | Admitting: Internal Medicine

## 2020-11-28 VITALS — BP 118/64 | HR 83 | Temp 98.1°F | Ht 59.0 in | Wt 147.4 lb

## 2020-11-28 DIAGNOSIS — D869 Sarcoidosis, unspecified: Secondary | ICD-10-CM

## 2020-11-28 DIAGNOSIS — R058 Other specified cough: Secondary | ICD-10-CM | POA: Diagnosis not present

## 2020-11-28 MED ORDER — FAMOTIDINE 20 MG PO TABS: ORAL_TABLET | ORAL | 11 refills | Status: DC

## 2020-11-28 NOTE — Progress Notes (Signed)
Subjective:    Patient ID: Alexis Murphy, female   DOB: 1946-04-04,    MRN: [id number]    Brief patient profile:  7 yowf quit smoking 2009 with new onset toe/foot/ anke pain around 2010 then L breast masses :  granuloma on bx 07/2015 and  L breast  nearby in 08/08/17 c/w NCG's  And then 2/ 2019  lumps both legs R> L >>> [name] referred to [name] who felt dx likely was likely sarcoid.    History of Present Illness  10/29/2017 1st [facility] office visit/ Jamall Strohmeier   Chief Complaint  Patient presents with  . Pulmonary Consult    Referred by [name], PA. Pt states that she was dxed with Sarcoid recently. She denies any respiratory co's.   main concerns continue to be aches in ankles and feet with new tender nodules c/w EN over R > L legs and pattern has been pauciarticular/ migratory since onset around 2010 , never rx with steroids to her knowledge  rec Plaquenil 200 mg daily until rheumatology re-evaluation      12/10/2017  f/u ov/Chablis Losh re:  Chief Complaint  Patient presents with  . Follow-up    breathing unchanged. no complaints.  Dyspnea:  Not limited by breathing from desired activities   Cough: not as issue Sleep: no resp symptoms  Alexis Murphy is derm does not have access to epic and questioned need for lung bx  Dr Irene Limbo followiing as well and has set up serial ct chest as cxr is wnl  [facility] rx with pred starting at 20mg  and tapering rec Plaquenil should be refilled thru [facility] and return here as needed if you develop shortness or breath, cough.  Be sure to tell your eye doctor you have sarcoid and you are on Plaquenil     11/28/2020  f/u ov/Aphrodite Harpenau re:  Sarcoid 2010  Previously on plaqenuil now on prednisone 20 mg x sev months> overall improvement arthritis  and on 15 mg x one week Chief Complaint  Patient presents with  . Follow-up    Wheezing and cough   Dyspnea:  Limited by knees > sob  Cough: more than usual since 4-6 m Sleeping: ok at hs / able to lie flat  SABA  use: none  02: none  Covid status:   vax x 3 / and hand covid   No obvious day to day or daytime variability or assoc excess/ purulent sputum or mucus plugs or hemoptysis or cp or chest tightness, subjective wheeze or overt sinus or hb symptoms.   Sleeping without nocturnal  or early am exacerbation  of respiratory  c/o's or need for noct saba. Also denies any obvious fluctuation of symptoms with weather or environmental changes or other aggravating or alleviating factors except as outlined above   No unusual exposure hx or h/o childhood pna/ asthma or knowledge of premature birth.  Current Allergies, Complete Past Medical History, Past Surgical History, Family History, and Social History were reviewed in [employer] record.  ROS  The following are not active complaints unless bolded Hoarseness, sore throat, dysphagia, dental problems, itching, sneezing,  nasal congestion or discharge of excess mucus or purulent secretions, ear ache,   fever, chills, sweats, unintended wt loss or wt gain, classically pleuritic or exertional cp,  orthopnea pnd or arm/hand swelling  or leg swelling, presyncope, palpitations, abdominal pain, anorexia, nausea, vomiting, diarrhea  or change in bowel habits or change in bladder habits, change in stools or  change in urine, dysuria, hematuria,  rash, arthralgias, visual complaints, headache, numbness, weakness or ataxia or problems with walking or coordination,  change in mood or  memory.        Current Meds  Medication Sig  . acetaminophen (TYLENOL) 325 MG tablet Take 1 tablet (325 mg total) by mouth every 6 (six) hours. (Patient taking differently: Take 325 mg by mouth every 6 (six) hours as needed for headache.)  . amLODipine (NORVASC) 5 MG tablet Take 5 mg by mouth every evening.   Marland Kitchen aspirin EC 81 MG tablet Take 81 mg by mouth daily.  Marland Kitchen atorvastatin (LIPITOR) 40 MG tablet Take 40 mg by mouth every evening.   . Cholecalciferol 25 MCG (1000  UT) tablet TAKE TWO TABLETS BY MOUTH ONCE DAILY  . FERREX 150 150 MG capsule TAKE ONE CAPSULE BY MOUTH ONCE daily  . folic acid (FOLVITE) 1 MG tablet Take 1 mg by mouth daily.  . hydroxyurea (HYDREA) 500 MG capsule Take 1 capsule (500 mg total) by mouth 2 (two) times a week. May take with food to minimize GI side effects.  Marland Kitchen levothyroxine (SYNTHROID) 50 MCG tablet Take 50 mcg by mouth every morning.  . Melatonin 5 MG TABS Take 5 mg by mouth at bedtime.  Marland Kitchen omeprazole (PRILOSEC) 40 MG capsule Take 40 mg by mouth every evening.   . potassium chloride SA (KLOR-CON) 20 MEQ tablet Take 1 tablet (20 mEq total) by mouth daily.  . predniSONE (DELTASONE) 5 MG tablet Take 5 mg by mouth daily.  Marland Kitchen venlafaxine XR (EFFEXOR-XR) 150 MG 24 hr capsule Take 150 mg by mouth every evening.                         Objective:   Physical Exam    11/28/2020        147 12/10/2017          152   10/29/17 155 lb (70.3 kg)  03/10/17 150 lb (68 kg)  12/17/16 155 lb (70.3 kg)       Vital signs reviewed  11/28/2020  - Note at rest 02 sats  96% on RA   General appearance:    amb wf nad      HEENT : pt wearing mask not removed for exam due to covid -19 concerns.    NECK :  without JVD/Nodes/TM/ nl carotid upstrokes bilaterally   LUNGS: no acc muscle use,  Nl contour chest which is clear to A and P bilaterally without cough on insp or exp maneuvers   CV:  RRR  no s3 or murmur or increase in P2, and no edema   ABD:  soft and nontender with nl inspiratory excursion in the supine position. No bruits or organomegaly appreciated, bowel sounds nl  MS:  Nl gait/ ext warm without deformities, calf tenderness, cyanosis or clubbing No obvious joint restrictions   SKIN: warm and dry without lesions    NEURO:  alert, approp, nl sensorium with  no motor or cerebellar deficits apparent.    CXR PA and Lateral:   11/28/2020 :    I personally reviewed images / impression as follows:  Mild/ mod t kyphosis, no  active pulmonary sarcoid           Assessment:

## 2020-11-28 NOTE — Patient Instructions (Signed)
Change omeprazole 40 mg to Take 30-60 min before first meal of the day and take pepcid 20 mg after supper   GERD (REFLUX)  is an extremely common cause of respiratory symptoms just like yours , many times with no obvious heartburn at all.    It can be treated with medication, but also with lifestyle changes including elevation of the head of your bed (ideally with 6 -8inch blocks under the headboard of your bed),  Smoking cessation, avoidance of late meals, excessive alcohol, and avoid fatty foods, chocolate, peppermint, colas, red wine, and acidic juices such as orange juice.  NO MINT OR MENTHOL PRODUCTS SO NO COUGH DROPS  USE SUGARLESS CANDY INSTEAD (Jolley ranchers or Stover's or Life Savers) or even ice chips will also do - the key is to swallow to prevent all throat clearing. NO OIL BASED VITAMINS - use powdered substitutes.  Avoid fish oil when coughing.  Please remember to go to the  x-ray department  for your tests - we will call you with the results when they are available     Please schedule a follow up office visit in 6 weeks, call sooner if needed with pfts on return

## 2020-11-29 ENCOUNTER — Encounter: Payer: Self-pay | Admitting: Internal Medicine

## 2020-11-29 DIAGNOSIS — R058 Other specified cough: Secondary | ICD-10-CM | POA: Insufficient documentation

## 2020-11-29 NOTE — Assessment & Plan Note (Signed)
Worse x early 2022  - max rx for gerd 11/28/2020 >>>  Not occurring noct or with ex or with insp/exp favors Upper airway cough syndrome (previously labeled PNDS),  is so named because it's frequently impossible to sort out how much is  CR/sinusitis with freq throat clearing (which can be related to primary GERD)   vs  causing  secondary (" extra esophageal")  GERD from wide swings in gastric pressure that occur with throat clearing, often  promoting self use of mint and menthol lozenges that reduce the lower esophageal sphincter tone and exacerbate the problem further in a cyclical fashion.   These are the same pts (now being labeled as having "irritable larynx syndrome" by some cough centers) who not infrequently have a history of having failed to tolerate ace inhibitors,  dry powder inhalers or biphosphonates or report having atypical/extraesophageal reflux symptoms that don't respond to standard doses of PPI  and are easily confused as having aecopd or asthma flares by even experienced allergists/ pulmonologists (myself included).   rec max rx for gerd x 6 weeks then f/u ov   Each maintenance medication was reviewed in detail including emphasizing most importantly the difference between maintenance and prns and under what circumstances the prns are to be triggered using an action plan format where appropriate.  Total time for H and P, chart review, counseling,  directly observing portions of ambulatory 02 saturation study/ and generating customized AVS unique to this office visit / same day charting = 30 min

## 2020-11-29 NOTE — Assessment & Plan Note (Signed)
Onset of symptoms ? Around 2010 (pt gives variable hx) foot then ankle pains always R > L  - Multiple breast bx's c/w granulomas and NCG the 2017 and 08/08/2017  - EN onset  08/2017 - Quant GOLD TB  10/29/17  Neg  - ESR 10/29/2017 = 39  With nl lfts > rec plaquenil trial 200 mg daily > rheum added steroids - PFT's 12/10/2017 wnl  -  11/28/2020   Walked RA  3laps @ approx 250ft each @ mod pace  stopped due to end of study,   no sob, sats 97%  At end  No evidence of active pulmonary sarcoid > continue rx of arthritis per rheum using lowest effective dose of prednisone. 

## 2020-11-30 NOTE — Progress Notes (Signed)
Spoke with pt and notified of results per Dr. Wert. Pt verbalized understanding and denied any questions. 

## 2020-12-09 DIAGNOSIS — M179 Osteoarthritis of knee, unspecified: Secondary | ICD-10-CM | POA: Diagnosis not present

## 2020-12-09 DIAGNOSIS — N183 Chronic kidney disease, stage 3 unspecified: Secondary | ICD-10-CM | POA: Diagnosis not present

## 2020-12-09 DIAGNOSIS — Z79899 Other long term (current) drug therapy: Secondary | ICD-10-CM | POA: Diagnosis not present

## 2020-12-09 DIAGNOSIS — Z23 Encounter for immunization: Secondary | ICD-10-CM | POA: Diagnosis not present

## 2020-12-09 DIAGNOSIS — N184 Chronic kidney disease, stage 4 (severe): Secondary | ICD-10-CM | POA: Diagnosis not present

## 2020-12-09 DIAGNOSIS — E039 Hypothyroidism, unspecified: Secondary | ICD-10-CM | POA: Diagnosis not present

## 2020-12-09 DIAGNOSIS — E7800 Pure hypercholesterolemia, unspecified: Secondary | ICD-10-CM | POA: Diagnosis not present

## 2020-12-09 DIAGNOSIS — K219 Gastro-esophageal reflux disease without esophagitis: Secondary | ICD-10-CM | POA: Diagnosis not present

## 2020-12-12 ENCOUNTER — Ambulatory Visit: Payer: Medicare HMO | Admitting: Hematology

## 2020-12-12 ENCOUNTER — Other Ambulatory Visit: Payer: Medicare HMO

## 2020-12-12 DIAGNOSIS — M1990 Unspecified osteoarthritis, unspecified site: Secondary | ICD-10-CM | POA: Diagnosis not present

## 2020-12-12 DIAGNOSIS — N183 Chronic kidney disease, stage 3 unspecified: Secondary | ICD-10-CM | POA: Diagnosis not present

## 2020-12-12 DIAGNOSIS — K219 Gastro-esophageal reflux disease without esophagitis: Secondary | ICD-10-CM | POA: Diagnosis not present

## 2020-12-12 DIAGNOSIS — E039 Hypothyroidism, unspecified: Secondary | ICD-10-CM | POA: Diagnosis not present

## 2020-12-12 DIAGNOSIS — R69 Illness, unspecified: Secondary | ICD-10-CM | POA: Diagnosis not present

## 2020-12-12 DIAGNOSIS — E7800 Pure hypercholesterolemia, unspecified: Secondary | ICD-10-CM | POA: Diagnosis not present

## 2020-12-12 DIAGNOSIS — M8580 Other specified disorders of bone density and structure, unspecified site: Secondary | ICD-10-CM | POA: Diagnosis not present

## 2020-12-16 DIAGNOSIS — Z1389 Encounter for screening for other disorder: Secondary | ICD-10-CM | POA: Diagnosis not present

## 2020-12-16 DIAGNOSIS — Z0000 Encounter for general adult medical examination without abnormal findings: Secondary | ICD-10-CM | POA: Diagnosis not present

## 2020-12-21 DIAGNOSIS — H348112 Central retinal vein occlusion, right eye, stable: Secondary | ICD-10-CM | POA: Diagnosis not present

## 2020-12-21 DIAGNOSIS — I7300 Raynaud's syndrome without gangrene: Secondary | ICD-10-CM | POA: Diagnosis not present

## 2020-12-21 DIAGNOSIS — D473 Essential (hemorrhagic) thrombocythemia: Secondary | ICD-10-CM | POA: Diagnosis not present

## 2020-12-21 DIAGNOSIS — Z1589 Genetic susceptibility to other disease: Secondary | ICD-10-CM | POA: Diagnosis not present

## 2020-12-21 DIAGNOSIS — R69 Illness, unspecified: Secondary | ICD-10-CM | POA: Diagnosis not present

## 2020-12-21 DIAGNOSIS — E1122 Type 2 diabetes mellitus with diabetic chronic kidney disease: Secondary | ICD-10-CM | POA: Diagnosis not present

## 2020-12-21 DIAGNOSIS — N1832 Chronic kidney disease, stage 3b: Secondary | ICD-10-CM | POA: Diagnosis not present

## 2020-12-21 DIAGNOSIS — R5383 Other fatigue: Secondary | ICD-10-CM | POA: Diagnosis not present

## 2020-12-21 DIAGNOSIS — D869 Sarcoidosis, unspecified: Secondary | ICD-10-CM | POA: Diagnosis not present

## 2020-12-21 DIAGNOSIS — M8580 Other specified disorders of bone density and structure, unspecified site: Secondary | ICD-10-CM | POA: Diagnosis not present

## 2020-12-21 DIAGNOSIS — M25461 Effusion, right knee: Secondary | ICD-10-CM | POA: Diagnosis not present

## 2020-12-28 DIAGNOSIS — R69 Illness, unspecified: Secondary | ICD-10-CM | POA: Diagnosis not present

## 2020-12-28 DIAGNOSIS — I700 Atherosclerosis of aorta: Secondary | ICD-10-CM | POA: Diagnosis not present

## 2020-12-28 DIAGNOSIS — R030 Elevated blood-pressure reading, without diagnosis of hypertension: Secondary | ICD-10-CM | POA: Diagnosis not present

## 2020-12-28 DIAGNOSIS — E039 Hypothyroidism, unspecified: Secondary | ICD-10-CM | POA: Diagnosis not present

## 2020-12-28 DIAGNOSIS — Z23 Encounter for immunization: Secondary | ICD-10-CM | POA: Diagnosis not present

## 2020-12-28 DIAGNOSIS — N183 Chronic kidney disease, stage 3 unspecified: Secondary | ICD-10-CM | POA: Diagnosis not present

## 2020-12-28 DIAGNOSIS — Z0000 Encounter for general adult medical examination without abnormal findings: Secondary | ICD-10-CM | POA: Diagnosis not present

## 2020-12-28 DIAGNOSIS — E7800 Pure hypercholesterolemia, unspecified: Secondary | ICD-10-CM | POA: Diagnosis not present

## 2020-12-28 DIAGNOSIS — K219 Gastro-esophageal reflux disease without esophagitis: Secondary | ICD-10-CM | POA: Diagnosis not present

## 2021-01-02 ENCOUNTER — Other Ambulatory Visit: Payer: Self-pay

## 2021-01-02 ENCOUNTER — Telehealth: Payer: Self-pay | Admitting: Hematology

## 2021-01-02 DIAGNOSIS — Z1589 Genetic susceptibility to other disease: Secondary | ICD-10-CM

## 2021-01-02 NOTE — Telephone Encounter (Signed)
Rescheduled upcoming appointment due to provider not in office. Patient is aware of changes. 

## 2021-01-03 ENCOUNTER — Other Ambulatory Visit: Payer: Medicare HMO

## 2021-01-03 ENCOUNTER — Ambulatory Visit: Payer: Medicare HMO | Admitting: Hematology

## 2021-01-10 ENCOUNTER — Other Ambulatory Visit (HOSPITAL_COMMUNITY): Payer: Medicare HMO

## 2021-01-11 NOTE — Progress Notes (Signed)
HEMATOLOGY/ONCOLOGY CLINIC NOTE  Date of Service: 01/12/21    Patient Care Team: Lawerance Cruel, MD as PCP - General (Family Medicine) [name], MD as PCP - Cardiology (Cardiology)  CHIEF COMPLAINTS/PURPOSE OF CONSULTATION:  F/u for mx of Jak2 MPN  HISTORY OF PRESENTING ILLNESS:   Alexis Murphy is a wonderful 75 y.o. 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 The pt reports that she is doing well overall. She has seen dermatology and rheumatology with work up of her sarcoidosis. She is seeing Dr Christinia Gully in one month in Pulmonology.   She notes that her skin changes first appeared 10 years ago.   The pt reports that her recent nose bx with [facility] a week ago revealed sarcoidosis and her right foot bx revealed stasis dermatitis. She has been diagnosed with sarcoidosis before this. She began a 20mg  prednisone 82-month-taper, three weeks ago with Dr. [name], and is also taking Plaquenil for her sarcoidosis. She had a CT on 09/19/17 to properly work up her sarcoidosis diagnosis.   She notes that prior to taking steroids her feet were very painful and also had some neuropathy. She also had night sweats and leg and ankle swelling but did not have fatigue or shortness of breath. These positive symptoms have resolved after beginning Prednisone.   She denies any breast symptoms. She has a MMG on 08/05/2017 which showed : IMPRESSION: New suspicious left breast mass 7:00 position retroareolar measuring 0.6 cm on ultrasound. Malignancy cannot be excluded. The mass does have similar imaging appearance to the previously biopsied left breast mass (January 2017, which demonstrated granulomatous inflammation and no evidence of malignancy at pathology.) The possibility of a second area of granulomatous inflammation is considered.   Borderline diffuse cortical thickening of a left axillary  lymph node. On review of the patient's prior CT a of the chest, patient does have mild diffuse lymphadenopathy in the thorax. At this time, axillary lymph node biopsy is not felt warranted.   The patient was given a copy of her pathology report from her left breast biopsy from January 2017. I told her she may want to share this information with her rheumatologist.  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   Of note prior to the patient's visit today, pt has had CT Chest completed on 09/19/17 with results revealing 1. Moderate mediastinal and right supraclavicular lymphadenopathy, increased. New mild bilateral axillary and bilateral hilar lymphadenopathy. Findings are worrisome for a malignant process such as a lymphoproliferative disorder or metastatic nodal disease. The right supraclavicular node may be (but not definitely) accessible for percutaneous biopsy. 2. Numerous similar-appearing subsolid spiculated pulmonary nodules scattered throughout both lungs, upper lobe predominant, largest 1.6 cm, stable to mildly increased in size and slightly increased in density in the interval. These are indeterminate, with multifocal lung adenocarcinoma on the differential. 3. Several new subcentimeter hypodense splenic lesions. Solitary small hypodense posterior right liver lobe lesion. These lesions are indeterminate, with lymphoproliferative disorder or metastatic disease not excluded. Options include further characterization with MRI abdomen without and with IV contrast or short-term follow-up CT abdomen with IV contrast in 3 months. 4. Chronic findings include: Left main and 3 vessel coronary atherosclerosis. Small hiatal hernia. Cholelithiasis.   Most recent lab results (03/10/17) of CBC  is as follows: all values are WNL except for WBC at 17.5k, MCV at 75.8, MCH at 25.2, RDW  at 17.6, Platelets at 804k.  On review of systems, pt reports dry mouth, decreased leg swelling, and  denies tingling, numbness in her hands or feet, dry eyes, problems breathing, cough, SOB, CP, nose bleeds, fatigue, abdominal pains, unexpected weight loss, fevers, chills, night sweats.   On PMHx the pt reports sarcoidosis, Bell's palsy twice.  On Social Hx the pt reports that she smoked cigarettes for 45 years. She quit smoking cigarettes 10 years ago but continues to smoke marijuana occasionally.  On Family Hx the pt reports maternal lupus.  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 The patient's last visit with [place] was on 09/12/2020. The pt reports that she is doing well overall.  The pt reports no new symptoms or concerns over the last three months. They want her to go on Remicade for her sarcoidoses, but she is still not fully keen on this option. She notes that they told her it would be $300-500 each time. The patient is currently still on the baby ASA and the chronic steroids. She had to go back on 20 mg Prednisone due to a joint flare and is now on 7.5 mg of the taper. She ran out of the iron pills and has not been taking this for a period of time.  Lab results today 01/12/2021 of CBC w/diff and CMP is as follows: all values are WNL except for WBC of 14.7K, RDW of 15.7, Plt of 689K, Neutro Abs of 12.1K, Glucose of 139, Creatinine of 1.66, AST of 13, GFR est of 32. 01/12/2021 . Lab Results  Component Value Date   IRON 52 01/12/2021   TIBC 278 01/12/2021   IRONPCTSAT 19 (L) 01/12/2021   (Iron and TIBC)  Lab Results  Component Value Date   FERRITIN 177 01/12/2021     On review of systems, pt reports recent joint pain and denies fevers, chills, infection issues, urinary issues, infection issues, and any other symptoms.   MEDICAL HISTORY:  Past Medical History:  Diagnosis Date   Arthritis    Hyperlipidemia    Sarcoidosis     SURGICAL HISTORY: Past Surgical History:  Procedure Laterality Date   ABDOMINAL HYSTERECTOMY     BREAST BIOPSY      BREAST SURGERY     REDUCTION MAMMAPLASTY      SOCIAL HISTORY: Social History   Socioeconomic History   Marital status: Widowed    Spouse name: Not on file   Number of children: Not on file   Years of education: Not on file   Highest education level: Not on file  Occupational History   Not on file  Tobacco Use   Smoking status: Former    Packs/day: 1.00    Years: 45.00    Pack years: 45.00    Types: Cigarettes    Quit date: 10/15/2007    Years since quitting: 13.2   Smokeless tobacco: Never  Vaping Use   Vaping Use: Never used  Substance and Sexual Activity   Alcohol use: Yes   Drug use: No   Sexual activity: Not on file  Other Topics Concern   Not on file  Social History Narrative   Not on file   Social Determinants of Health   Financial Resource Strain: Not on file  Food Insecurity: Not on file  Transportation Needs: Not on file  Physical Activity: Not on file  Stress: Not on file  Social Connections: Not on file  Intimate Partner Violence: Not on file  FAMILY HISTORY: Family History  Problem Relation Age of Onset   Heart disease Father    Heart disease Brother     ALLERGIES:  is allergic to penicillins and codeine.  MEDICATIONS:  Current Outpatient Medications  Medication Sig Dispense Refill   acetaminophen (TYLENOL) 325 MG tablet Take 1 tablet (325 mg total) by mouth every 6 (six) hours. (Patient taking differently: Take 325 mg by mouth every 6 (six) hours as needed for headache.) 30 tablet 0   amLODipine (NORVASC) 5 MG tablet Take 5 mg by mouth every evening.      aspirin EC 81 MG tablet Take 81 mg by mouth daily.     atorvastatin (LIPITOR) 40 MG tablet Take 40 mg by mouth every evening.      Cholecalciferol 25 MCG (1000 UT) tablet Take 2 tablets (2,000 Units total) by mouth daily. 60 tablet 5   famotidine (PEPCID) 20 MG tablet One after supper 30 tablet 11   folic acid (FOLVITE) 1 MG tablet Take 1 tablet (1 mg total) by mouth daily. 30 tablet 2    hydroxyurea (HYDREA) 500 MG capsule Take 1 capsule (500 mg total) by mouth 2 (two) times a week. May take with food to minimize GI side effects. 30 capsule 0   iron polysaccharides (FERREX 150) 150 MG capsule Take 1 capsule (150 mg total) by mouth daily. 30 capsule 2   levothyroxine (SYNTHROID) 50 MCG tablet Take 50 mcg by mouth every morning.     Melatonin 5 MG TABS Take 5 mg by mouth at bedtime.     omeprazole (PRILOSEC) 40 MG capsule Take 40 mg by mouth every evening.      potassium chloride SA (KLOR-CON) 20 MEQ tablet Take 1 tablet (20 mEq total) by mouth daily. 30 tablet 1   predniSONE (DELTASONE) 5 MG tablet Take 5 mg by mouth daily.     venlafaxine XR (EFFEXOR-XR) 150 MG 24 hr capsule Take 150 mg by mouth every evening.      No current facility-administered medications for this visit.    REVIEW OF SYSTEMS:   10 Point review of Systems was done is negative except as noted above.  PHYSICAL EXAMINATION: ECOG FS:2 - Symptomatic, <50% confined to bed  Vitals:   01/12/21 1319  BP: (!) 168/54  Pulse: 95  Resp: 18  Temp: 98.9 F (37.2 C)  SpO2: 97%    Wt Readings from Last 3 Encounters:  01/12/21 141 lb 1.6 oz (64 kg)  11/28/20 147 lb 6.4 oz (66.9 kg)  09/12/20 152 lb 4.8 oz (69.1 kg)   Body mass index is 28.5 kg/m.     GENERAL:alert, in no acute distress and comfortable SKIN: no acute rashes, no significant lesions EYES: conjunctiva are pink and non-injected, sclera anicteric OROPHARYNX: MMM, no exudates, no oropharyngeal erythema or ulceration NECK: supple, no JVD LYMPH:  no palpable lymphadenopathy in the cervical, axillary or inguinal regions LUNGS: clear to auscultation b/l with normal respiratory effort HEART: regular rate & rhythm ABDOMEN:  normoactive bowel sounds , non tender, not distended. Extremity: no pedal edema PSYCH: alert & oriented x 3 with fluent speech NEURO: no focal motor/sensory deficits  LABORATORY DATA:  I have reviewed the data as  listed  . CBC Latest Ref Rng & Units 01/12/2021 09/12/2020 07/12/2020  WBC 4.0 - 10.5 K/uL 14.7(H) 8.7 7.7  Hemoglobin 12.0 - 15.0 g/dL 12.6 9.1(L) 9.0(L)  Hematocrit 36.0 - 46.0 % 38.9 30.5(L) 30.2(L)  Platelets 150 - 400 K/uL 689(H) 422(H)  422(H)    . CMP Latest Ref Rng & Units 01/12/2021 09/12/2020 07/12/2020  Glucose 70 - 99 mg/dL 139(H) 100(H) 97  BUN 8 - 23 mg/dL 21 27(H) 24(H)  Creatinine 0.44 - 1.00 mg/dL 1.66(H) 2.03(H) 1.73(H)  Sodium 135 - 145 mmol/L 144 137 139  Potassium 3.5 - 5.1 mmol/L 3.6 3.3(L) 4.1  Chloride 98 - 111 mmol/L 106 102 105  CO2 22 - 32 mmol/L 25 26 24   Calcium 8.9 - 10.3 mg/dL 9.0 8.6(L) 8.8(L)  Total Protein 6.5 - 8.1 g/dL 6.9 6.9 7.3  Total Bilirubin 0.3 - 1.2 mg/dL 0.5 0.2(L) 0.4  Alkaline Phos 38 - 126 U/L 109 104 113  AST 15 - 41 U/L 13(L) 16 17  ALT 0 - 44 U/L 11 11 13            RADIOGRAPHIC STUDIES: I have personally reviewed the radiological images as listed and agreed with the findings in the report. No results found. CT chest 09/19/2017: IMPRESSION: 1. Moderate mediastinal and right supraclavicular lymphadenopathy, increased. New mild bilateral axillary and bilateral hilar lymphadenopathy. Findings are worrisome for a malignant process such as a lymphoproliferative disorder or metastatic nodal disease. The right supraclavicular node may be (but not definitely) accessible for percutaneous biopsy. 2. Numerous similar-appearing subsolid spiculated pulmonary nodules scattered throughout both lungs, upper lobe predominant, largest 1.6 cm, stable to mildly increased in size and slightly increased in density in the interval. These are indeterminate, with multifocal lung adenocarcinoma on the differential. 3. Several new subcentimeter hypodense splenic lesions. Solitary small hypodense posterior right liver lobe lesion. These lesions are indeterminate, with lymphoproliferative disorder or metastatic disease not excluded. Options include further  characterization with MRI abdomen without and with IV contrast or short-term follow-up CT abdomen with IV contrast in 3 months. 4. Chronic findings include: Left main and 3 vessel coronary atherosclerosis. Small hiatal hernia. Cholelithiasis.   Aortic Atherosclerosis (ICD10-I70.0).    Electronically Signed   By: Ilona Sorrel M.D.   On: 09/19/2017 11:54   ASSESSMENT & PLAN:   75 y.o. female with  1. Sarcoidosis Presented with Generalized lymphadenopathy with multiple pulmonary nodules CT Chest 09/19/2017: Moderate mediastinal and right supraclavicular lymphadenopathy, increased. New mild bilateral axillary and bilateral hilar lymphadenopathy. Findings are worrisome for a malignant process such as a lymphoproliferative disorder or metastatic nodal disease. The right supraclavicular node may be (but not definitely) accessible for percutaneous biopsy. 2. Numerous similar-appearing subsolid spiculated pulmonary nodules scattered throughout both lungs, upper lobe predominant, largest 1.6 cm, stable to mildly increased in size and slightly increased in density in the interval. These are indeterminate, with multifocal lung adenocarcinoma on the differential.  12/26/17 CT C/A/P revealed interval response to steroids in LNadenopathy   07/17/18 CT C/A/P revealed Continue improvement in mediastinal adenopathy. Lymph nodes are now not pathologic by size criteria. 2. Continued improvement in nodular and ground-glass densities particularly in the RIGHT upper lobe. 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  2. Breast - lesion - Bx -- granulomatous inflammation -consistent with sarcoidosis.  3. Skin rash on nose - Bx consistent with sarcoidosis.  4. H/o  Iron deficiency - no significant anemia . Lab Results  Component Value Date   IRON 20 (L) 09/12/2020   TIBC 393 09/12/2020   IRONPCTSAT 5 (L) 09/12/2020   (Iron and TIBC)  Lab Results  Component Value Date   FERRITIN 12  09/12/2020   5.  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 Iron deficiency anemia   PLAN: -Discussed pt labwork today, 01/12/2021; no longer anemic, Plt have increased, WBC increased due to steroids. -Advised pt that Remicade can cause some suppression of counts. We would need to monitor more closely if she chooses this option. -Recommended pt get an arm blood pressure monitor. The increase in steroids recently can cause elevation in blood pressure. -Increase the Hydroxyurea dosage to 500 mg 4x per week, as the patient does not believe she is starting the Remicade soon. The pt has no prohibitive toxicities at this time. -If patient decides to start on remicade, we will want the dosage of Hydroxyurea to be 500 mg 3x weekly. -Can switch iron polysaccharide to once daily. -Continue Potassium, Folic Acid, Iron. -Continue 2000 IU Vitamin D daily -Continue daily baby Aspirin -Will see back in 2 months with labs.   FOLLOW UP: RTC with Dr Irene Limbo with labs in 2 months   The total time spent in the appointment was 25 minutes and more than 50% was on counseling and direct patient cares.   All of the patient's questions were answered with apparent satisfaction. The patient knows to call the clinic with any problems, questions or concerns.   Sullivan Lone MD [place] AAHIVMS [facility] Hematology/Oncology Physician [facility]  (Office):       [phone] (Work cell):  [phone] (Fax):           [phone]  01/12/2021 2:07 PM  I, Reinaldo Raddle, am acting as scribe for Dr. Sullivan Lone, MD.    .I have reviewed  the above documentation for accuracy and completeness, and I agree with the above. Brunetta Genera MD

## 2021-01-12 ENCOUNTER — Other Ambulatory Visit: Payer: Self-pay

## 2021-01-12 ENCOUNTER — Inpatient Hospital Stay: Payer: Medicare HMO | Attending: Hematology

## 2021-01-12 ENCOUNTER — Inpatient Hospital Stay (HOSPITAL_BASED_OUTPATIENT_CLINIC_OR_DEPARTMENT_OTHER): Payer: Medicare HMO | Admitting: Hematology

## 2021-01-12 VITALS — BP 168/54 | HR 95 | Temp 98.9°F | Resp 18 | Wt 141.1 lb

## 2021-01-12 DIAGNOSIS — Z7982 Long term (current) use of aspirin: Secondary | ICD-10-CM

## 2021-01-12 DIAGNOSIS — Z1589 Genetic susceptibility to other disease: Secondary | ICD-10-CM

## 2021-01-12 DIAGNOSIS — D869 Sarcoidosis, unspecified: Secondary | ICD-10-CM

## 2021-01-12 DIAGNOSIS — Z87891 Personal history of nicotine dependence: Secondary | ICD-10-CM | POA: Diagnosis not present

## 2021-01-12 DIAGNOSIS — Z79899 Other long term (current) drug therapy: Secondary | ICD-10-CM | POA: Insufficient documentation

## 2021-01-12 DIAGNOSIS — D473 Essential (hemorrhagic) thrombocythemia: Secondary | ICD-10-CM

## 2021-01-12 DIAGNOSIS — Z862 Personal history of diseases of the blood and blood-forming organs and certain disorders involving the immune mechanism: Secondary | ICD-10-CM

## 2021-01-12 DIAGNOSIS — D471 Chronic myeloproliferative disease: Secondary | ICD-10-CM | POA: Diagnosis not present

## 2021-01-12 DIAGNOSIS — D509 Iron deficiency anemia, unspecified: Secondary | ICD-10-CM

## 2021-01-12 DIAGNOSIS — E876 Hypokalemia: Secondary | ICD-10-CM

## 2021-01-12 LAB — CBC WITH DIFFERENTIAL (CANCER CENTER ONLY)
Abs Immature Granulocytes: 0.2 10*3/uL — ABNORMAL HIGH (ref 0.00–0.07)
Basophils Absolute: 0.1 10*3/uL (ref 0.0–0.1)
Basophils Relative: 1 %
Eosinophils Absolute: 0.1 10*3/uL (ref 0.0–0.5)
Eosinophils Relative: 1 %
HCT: 38.9 % (ref 36.0–46.0)
Hemoglobin: 12.6 g/dL (ref 12.0–15.0)
Immature Granulocytes: 1 %
Lymphocytes Relative: 10 %
Lymphs Abs: 1.5 10*3/uL (ref 0.7–4.0)
MCH: 30.8 pg (ref 26.0–34.0)
MCHC: 32.4 g/dL (ref 30.0–36.0)
MCV: 95.1 fL (ref 80.0–100.0)
Monocytes Absolute: 0.8 10*3/uL (ref 0.1–1.0)
Monocytes Relative: 5 %
Neutro Abs: 12.1 10*3/uL — ABNORMAL HIGH (ref 1.7–7.7)
Neutrophils Relative %: 82 %
Platelet Count: 689 10*3/uL — ABNORMAL HIGH (ref 150–400)
RBC: 4.09 MIL/uL (ref 3.87–5.11)
RDW: 15.7 % — ABNORMAL HIGH (ref 11.5–15.5)
WBC Count: 14.7 10*3/uL — ABNORMAL HIGH (ref 4.0–10.5)
nRBC: 0 % (ref 0.0–0.2)

## 2021-01-12 LAB — CMP (CANCER CENTER ONLY)
ALT: 11 U/L (ref 0–44)
AST: 13 U/L — ABNORMAL LOW (ref 15–41)
Albumin: 4 g/dL (ref 3.5–5.0)
Alkaline Phosphatase: 109 U/L (ref 38–126)
Anion gap: 13 (ref 5–15)
BUN: 21 mg/dL (ref 8–23)
CO2: 25 mmol/L (ref 22–32)
Calcium: 9 mg/dL (ref 8.9–10.3)
Chloride: 106 mmol/L (ref 98–111)
Creatinine: 1.66 mg/dL — ABNORMAL HIGH (ref 0.44–1.00)
GFR, Estimated: 32 mL/min — ABNORMAL LOW (ref >60)
Glucose, Bld: 139 mg/dL — ABNORMAL HIGH (ref 70–99)
Potassium: 3.6 mmol/L (ref 3.5–5.1)
Sodium: 144 mmol/L (ref 135–145)
Total Bilirubin: 0.5 mg/dL (ref 0.3–1.2)
Total Protein: 6.9 g/dL (ref 6.5–8.1)

## 2021-01-12 LAB — FERRITIN: Ferritin: 177 ng/mL (ref 11–307)

## 2021-01-12 LAB — IRON AND TIBC
Iron: 52 ug/dL (ref 41–142)
Saturation Ratios: 19 % — ABNORMAL LOW (ref 21–57)
TIBC: 278 ug/dL (ref 236–444)
UIBC: 225 ug/dL (ref 120–384)

## 2021-01-12 MED ORDER — POLYSACCHARIDE IRON COMPLEX 150 MG PO CAPS: 150.0000 mg | ORAL_CAPSULE | Freq: Every day | ORAL | 2 refills | Status: DC

## 2021-01-12 MED ORDER — HYDROXYUREA 500 MG PO CAPS: ORAL_CAPSULE | ORAL | 3 refills | Status: DC

## 2021-01-12 MED ORDER — FOLIC ACID 1 MG PO TABS: 1.0000 mg | ORAL_TABLET | Freq: Every day | ORAL | 2 refills | Status: DC

## 2021-01-12 MED ORDER — POTASSIUM CHLORIDE CRYS ER 20 MEQ PO TBCR: 20.0000 meq | EXTENDED_RELEASE_TABLET | Freq: Every day | ORAL | 1 refills | Status: DC

## 2021-01-12 MED ORDER — FOLIC ACID 1 MG PO TABS: 1.0000 mg | ORAL_TABLET | Freq: Every day | ORAL | 5 refills | Status: DC

## 2021-01-12 MED ORDER — CHOLECALCIFEROL 25 MCG (1000 UT) PO TABS: 2000.0000 [IU] | ORAL_TABLET | Freq: Every day | ORAL | 5 refills | Status: DC

## 2021-01-13 ENCOUNTER — Ambulatory Visit: Payer: Medicare HMO | Admitting: Internal Medicine

## 2021-01-17 ENCOUNTER — Telehealth: Payer: Self-pay | Admitting: Hematology

## 2021-01-17 NOTE — Telephone Encounter (Signed)
Scheduled follow-up appointment per 7/7 los. Patient is aware. 

## 2021-01-18 ENCOUNTER — Encounter: Payer: Self-pay | Admitting: Hematology

## 2021-01-27 ENCOUNTER — Other Ambulatory Visit (HOSPITAL_COMMUNITY)
Admission: RE | Admit: 2021-01-27 | Discharge: 2021-01-27 | Disposition: A | Discharge status: Home / Self Care | Payer: Medicare HMO | Source: Ambulatory Visit | Attending: Internal Medicine | Admitting: Internal Medicine

## 2021-01-27 DIAGNOSIS — Z20822 Contact with and (suspected) exposure to covid-19: Secondary | ICD-10-CM | POA: Insufficient documentation

## 2021-01-27 DIAGNOSIS — Z01812 Encounter for preprocedural laboratory examination: Secondary | ICD-10-CM | POA: Insufficient documentation

## 2021-01-27 LAB — SARS CORONAVIRUS 2 (TAT 6-24 HRS): SARS Coronavirus 2: NEGATIVE

## 2021-01-30 ENCOUNTER — Other Ambulatory Visit: Payer: Self-pay | Admitting: *Deleted

## 2021-01-30 DIAGNOSIS — D869 Sarcoidosis, unspecified: Secondary | ICD-10-CM

## 2021-01-31 ENCOUNTER — Ambulatory Visit (INDEPENDENT_AMBULATORY_CARE_PROVIDER_SITE_OTHER): Payer: Medicare HMO | Admitting: Internal Medicine

## 2021-01-31 ENCOUNTER — Other Ambulatory Visit: Payer: Self-pay

## 2021-01-31 ENCOUNTER — Encounter: Payer: Self-pay | Admitting: *Deleted

## 2021-01-31 ENCOUNTER — Ambulatory Visit: Payer: Medicare HMO | Admitting: Internal Medicine

## 2021-01-31 ENCOUNTER — Encounter: Payer: Self-pay | Admitting: Internal Medicine

## 2021-01-31 DIAGNOSIS — D869 Sarcoidosis, unspecified: Secondary | ICD-10-CM

## 2021-01-31 LAB — PULMONARY FUNCTION TEST
DL/VA % pred: 116 %
DL/VA: 4.86 ml/min/mmHg/L
DLCO cor % pred: 96 %
DLCO cor: 17.22 ml/min/mmHg
DLCO unc % pred: 93 %
DLCO unc: 16.78 ml/min/mmHg
FEF 25-75 Post: 1.65 L/sec
FEF 25-75 Pre: 1.6 L/sec
FEF2575-%Change-Post: 3 %
FEF2575-%Pred-Post: 103 %
FEF2575-%Pred-Pre: 100 %
FEV1-%Change-Post: 0 %
FEV1-%Pred-Post: 87 %
FEV1-%Pred-Pre: 86 %
FEV1-Post: 1.71 L
FEV1-Pre: 1.7 L
FEV1FVC-%Change-Post: -1 %
FEV1FVC-%Pred-Pre: 107 %
FEV6-%Change-Post: 2 %
FEV6-%Pred-Post: 86 %
FEV6-%Pred-Pre: 84 %
FEV6-Post: 2.15 L
FEV6-Pre: 2.1 L
FEV6FVC-%Pred-Post: 105 %
FEV6FVC-%Pred-Pre: 105 %
FVC-%Change-Post: 2 %
FVC-%Pred-Post: 82 %
FVC-%Pred-Pre: 80 %
FVC-Post: 2.15 L
FVC-Pre: 2.1 L
Post FEV1/FVC ratio: 80 %
Post FEV6/FVC ratio: 100 %
Pre FEV1/FVC ratio: 81 %
Pre FEV6/FVC Ratio: 100 %
RV % pred: 77 %
RV: 1.68 L
TLC % pred: 91 %
TLC: 4.35 L

## 2021-01-31 NOTE — Assessment & Plan Note (Signed)
Onset of symptoms ? Around 2010 (pt gives variable hx) foot then ankle pains always R > L  - Multiple breast bx's c/w granulomas and NCG the 2017 and 08/08/2017  - EN onset  08/2017 - Quant GOLD TB  10/29/17  Neg  - ESR 10/29/2017 = 39  With nl lfts > rec plaquenil trial 200 mg daily > rheum added steroids - PFT's 12/10/2017 wnl  -  11/28/2020   Walked RA  3laps @ approx 272f each @ mod pace  stopped due to end of study,   no sob, sats 97%  At end - PFTs with dlco 01/31/2021 wnl x for ERV 52% at wt 146  > pulmonary f/u prn   No evidence of significant pulmonary sarcoid so all f/u per Rheum and f/u here is prn          Each maintenance medication was reviewed in detail including emphasizing most importantly the difference between maintenance and prns and under what circumstances the prns are to be triggered using an action plan format where appropriate.  Total time for H and P, chart review, counseling,   and generating customized AVS unique to this office visit / same day charting =27 min

## 2021-01-31 NOTE — Progress Notes (Signed)
Subjective:    Patient ID: Alexis Murphy, female   DOB: 08/16/1945,    MRN: [id number]    Brief patient profile:  60 yowf quit smoking 2009 with new onset toe/foot/ ankle pain around 2010 then L breast masses :  granuloma on bx 07/2015 and  L breast  nearby in 08/08/17 c/w NCG's  And then 2/ 2019  lumps both legs R> L >>> [name] referred to [name] who felt dx likely was likely sarcoid.    History of Present Illness  10/29/2017 1st [facility] office visit/ Lajoyce Tamura   Chief Complaint  Patient presents with   Pulmonary Consult    Referred by [name], PA. Pt states that she was dxed with Sarcoid recently. She denies any respiratory co's.   main concerns continue to be aches in ankles and feet with new tender nodules c/w EN over R > L legs and pattern has been pauciarticular/ migratory since onset around 2010 , never rx with steroids to her knowledge  rec Plaquenil 200 mg daily until rheumatology re-evaluation      12/10/2017  f/u ov/Saudia Smyser re: ? Sarcoid  Chief Complaint  Patient presents with   Follow-up    breathing unchanged. no complaints.  Dyspnea:  Not limited by breathing from desired activities   Cough: not as issue Sleep: no resp symptoms noct  Delman Cheadle is derm does not have access to epic and questioned need for lung bx  Dr Irene Limbo followiing as well and has set up serial ct chest as cxr is wnl  [facility] rx with pred starting at 20mg  and tapering rec Plaquenil should be refilled thru [facility] and return here as needed if you develop shortness or breath, cough.  Be sure to tell your eye doctor you have sarcoid and you are on Plaquenil     11/28/2020  f/u ov/Alpha Chouinard re:  Sarcoid 2010  Previously on plaqenuil now on prednisone 20 mg x sev months> overall improvement arthritis  and on 15 mg x one week Chief Complaint  Patient presents with   Follow-up    Wheezing and cough   Dyspnea:  Limited by knees > sob  Cough: more than usual since 4-6 m Sleeping: ok at hs / able to lie  flat  SABA use: none  02: none  Covid status:   vax x 3 / and hand covid Rec Change omeprazole 40 mg to Take 30-60 min before first meal of the day and take pepcid 20 mg after supper  GERD diet reviewed, bed blocks rec      01/31/2021  f/u ov/Joyclyn Plazola re:  sarcoid 2010 maint now on prednisone 7.5 mg and no plaquenil per rheum No chief complaint on file.  Dyspnea:  knees stop before breathing limits  Cough: none  Sleeping: able to lie flat bed / 2 pillow s resp c/o's SABA use: none 02: none  Covid status:  vax x 4    No obvious day to day or daytime variability or assoc excess/ purulent sputum or mucus plugs or hemoptysis or cp or chest tightness, subjective wheeze or overt sinus or hb symptoms.   Sleeping fine  without nocturnal  or early am exacerbation  of respiratory  c/o's or need for noct saba. Also denies any obvious fluctuation of symptoms with weather or environmental changes or other aggravating or alleviating factors except as outlined above   No unusual exposure hx or h/o childhood pna/ asthma or knowledge of premature birth.  Current Allergies, Complete  Past Medical History, Past Surgical History, Family History, and Social History were reviewed in [employer] record.  ROS  The following are not active complaints unless bolded Hoarseness, sore throat, dysphagia, dental problems, itching, sneezing,  nasal congestion or discharge of excess mucus or purulent secretions, ear ache,   fever, chills, sweats, unintended wt loss or wt gain, classically pleuritic or exertional cp,  orthopnea pnd or arm/hand swelling  or leg swelling, presyncope, palpitations, abdominal pain, anorexia, nausea, vomiting, diarrhea  or change in bowel habits or change in bladder habits, change in stools or change in urine, dysuria, hematuria,  rash, arthralgias, visual complaints, headache, numbness, weakness or ataxia or problems with walking or coordination,  change in mood or   memory.        Current Meds  Medication Sig   acetaminophen (TYLENOL) 325 MG tablet Take 1 tablet (325 mg total) by mouth every 6 (six) hours. (Patient taking differently: Take 325 mg by mouth every 6 (six) hours as needed for headache.)   amLODipine (NORVASC) 5 MG tablet Take 5 mg by mouth every evening.    aspirin EC 81 MG tablet Take 81 mg by mouth daily.   atorvastatin (LIPITOR) 40 MG tablet Take 40 mg by mouth every evening.    Cholecalciferol 25 MCG (1000 UT) tablet Take 2 tablets (2,000 Units total) by mouth daily.   famotidine (PEPCID) 20 MG tablet One after supper   folic acid (FOLVITE) 1 MG tablet Take 1 tablet (1 mg total) by mouth daily.   hydroxyurea (HYDREA) 500 MG capsule Take 500mg  po daily 4 days a week (Mon through Thursday) . Do not take Friday, sat and Sunday. May take with food to minimize GI side effects.   iron polysaccharides (FERREX 150) 150 MG capsule Take 1 capsule (150 mg total) by mouth daily.   levothyroxine (SYNTHROID) 50 MCG tablet Take 50 mcg by mouth every morning.   Melatonin 5 MG TABS Take 5 mg by mouth at bedtime.   omeprazole (PRILOSEC) 40 MG capsule Take 40 mg by mouth every evening.    potassium chloride SA (KLOR-CON) 20 MEQ tablet Take 1 tablet (20 mEq total) by mouth daily.   predniSONE (DELTASONE) 5 MG tablet Take 5 mg by mouth daily.   venlafaxine XR (EFFEXOR-XR) 150 MG 24 hr capsule Take 150 mg by mouth every evening.             Objective:   Physical Exam  01/31/2021        146  11/28/2020        147 12/10/2017          15 2   10/29/17 155 lb (70.3 kg)  03/10/17 150 lb (68 kg)  12/17/16 155 lb (70.3 kg)      Vital signs reviewed  01/31/2021  - Note at rest 02 sats  97% on RA   General appearance:    amb pleasant wf nad    HEENT : pt wearing mask not removed for exam due to covid -19 concerns.    NECK :  without JVD/Nodes/TM/ nl carotid upstrokes bilaterally   LUNGS: no acc muscle use,  Nl contour chest which is clear to A and P  bilaterally without cough on insp or exp maneuvers   CV:  RRR  no s3 or murmur or increase in P2, and no edema   ABD:  soft and nontender with nl inspiratory excursion in the supine position. No bruits or organomegaly appreciated, bowel sounds  nl  MS:  Nl gait/ ext warm without deformities, calf tenderness, cyanosis or clubbing No obvious joint restrictions   SKIN: warm and dry without lesions    NEURO:  alert, approp, nl sensorium with  no motor or cerebellar deficits apparent.                Assessment:

## 2021-01-31 NOTE — Patient Instructions (Signed)
Full PFT performed today. °

## 2021-01-31 NOTE — Progress Notes (Signed)
Full PFT performed today. °

## 2021-01-31 NOTE — Patient Instructions (Signed)
Pulmonary follow up is as needed for persistent cough or problems with exertion due to breathing.    If you are satisfied with your treatment plan,  let your doctor know and he/she can either refill your medications or you can return here when your prescription runs out.     If in any way you are not 100% satisfied,  please tell us.  If 100% better, tell your friends!  Pulmonary follow up is as needed

## 2021-02-19 DIAGNOSIS — E7800 Pure hypercholesterolemia, unspecified: Secondary | ICD-10-CM | POA: Diagnosis not present

## 2021-02-19 DIAGNOSIS — R69 Illness, unspecified: Secondary | ICD-10-CM | POA: Diagnosis not present

## 2021-02-19 DIAGNOSIS — M8580 Other specified disorders of bone density and structure, unspecified site: Secondary | ICD-10-CM | POA: Diagnosis not present

## 2021-02-19 DIAGNOSIS — K219 Gastro-esophageal reflux disease without esophagitis: Secondary | ICD-10-CM | POA: Diagnosis not present

## 2021-02-19 DIAGNOSIS — N183 Chronic kidney disease, stage 3 unspecified: Secondary | ICD-10-CM | POA: Diagnosis not present

## 2021-02-19 DIAGNOSIS — E039 Hypothyroidism, unspecified: Secondary | ICD-10-CM | POA: Diagnosis not present

## 2021-02-19 DIAGNOSIS — M1990 Unspecified osteoarthritis, unspecified site: Secondary | ICD-10-CM | POA: Diagnosis not present

## 2021-02-27 ENCOUNTER — Other Ambulatory Visit: Payer: Self-pay | Admitting: Family Medicine

## 2021-02-27 DIAGNOSIS — Z1231 Encounter for screening mammogram for malignant neoplasm of breast: Secondary | ICD-10-CM

## 2021-03-08 ENCOUNTER — Other Ambulatory Visit: Payer: Self-pay | Admitting: Hematology

## 2021-03-08 DIAGNOSIS — E876 Hypokalemia: Secondary | ICD-10-CM

## 2021-03-08 DIAGNOSIS — D473 Essential (hemorrhagic) thrombocythemia: Secondary | ICD-10-CM

## 2021-03-15 DIAGNOSIS — Z1589 Genetic susceptibility to other disease: Secondary | ICD-10-CM | POA: Diagnosis not present

## 2021-03-15 DIAGNOSIS — H348112 Central retinal vein occlusion, right eye, stable: Secondary | ICD-10-CM | POA: Diagnosis not present

## 2021-03-15 DIAGNOSIS — D473 Essential (hemorrhagic) thrombocythemia: Secondary | ICD-10-CM | POA: Diagnosis not present

## 2021-03-15 DIAGNOSIS — I7300 Raynaud's syndrome without gangrene: Secondary | ICD-10-CM | POA: Diagnosis not present

## 2021-03-15 DIAGNOSIS — Z7952 Long term (current) use of systemic steroids: Secondary | ICD-10-CM | POA: Diagnosis not present

## 2021-03-15 DIAGNOSIS — M150 Primary generalized (osteo)arthritis: Secondary | ICD-10-CM | POA: Diagnosis not present

## 2021-03-15 DIAGNOSIS — L52 Erythema nodosum: Secondary | ICD-10-CM | POA: Diagnosis not present

## 2021-03-15 DIAGNOSIS — D869 Sarcoidosis, unspecified: Secondary | ICD-10-CM | POA: Diagnosis not present

## 2021-03-15 DIAGNOSIS — N1832 Chronic kidney disease, stage 3b: Secondary | ICD-10-CM | POA: Diagnosis not present

## 2021-03-15 DIAGNOSIS — M8580 Other specified disorders of bone density and structure, unspecified site: Secondary | ICD-10-CM | POA: Diagnosis not present

## 2021-03-16 ENCOUNTER — Other Ambulatory Visit: Payer: Self-pay

## 2021-03-16 DIAGNOSIS — D509 Iron deficiency anemia, unspecified: Secondary | ICD-10-CM

## 2021-03-20 ENCOUNTER — Other Ambulatory Visit: Payer: Self-pay

## 2021-03-20 ENCOUNTER — Inpatient Hospital Stay: Payer: Medicare HMO

## 2021-03-20 ENCOUNTER — Inpatient Hospital Stay: Payer: Medicare HMO | Attending: Hematology | Admitting: Hematology

## 2021-03-20 VITALS — BP 139/75 | HR 91 | Temp 97.6°F | Resp 18 | Ht 59.0 in | Wt 144.5 lb

## 2021-03-20 DIAGNOSIS — D509 Iron deficiency anemia, unspecified: Secondary | ICD-10-CM

## 2021-03-20 DIAGNOSIS — Z79899 Other long term (current) drug therapy: Secondary | ICD-10-CM | POA: Diagnosis not present

## 2021-03-20 DIAGNOSIS — Z862 Personal history of diseases of the blood and blood-forming organs and certain disorders involving the immune mechanism: Secondary | ICD-10-CM | POA: Diagnosis not present

## 2021-03-20 DIAGNOSIS — Z7982 Long term (current) use of aspirin: Secondary | ICD-10-CM | POA: Diagnosis not present

## 2021-03-20 DIAGNOSIS — D471 Chronic myeloproliferative disease: Secondary | ICD-10-CM | POA: Diagnosis not present

## 2021-03-20 DIAGNOSIS — Z87891 Personal history of nicotine dependence: Secondary | ICD-10-CM | POA: Diagnosis not present

## 2021-03-20 DIAGNOSIS — D869 Sarcoidosis, unspecified: Secondary | ICD-10-CM | POA: Diagnosis not present

## 2021-03-20 DIAGNOSIS — Z1589 Genetic susceptibility to other disease: Secondary | ICD-10-CM

## 2021-03-20 LAB — CBC WITH DIFFERENTIAL (CANCER CENTER ONLY)
Abs Immature Granulocytes: 0.13 10*3/uL — ABNORMAL HIGH (ref 0.00–0.07)
Basophils Absolute: 0.1 10*3/uL (ref 0.0–0.1)
Basophils Relative: 1 %
Eosinophils Absolute: 0.1 10*3/uL (ref 0.0–0.5)
Eosinophils Relative: 1 %
HCT: 36.5 % (ref 36.0–46.0)
Hemoglobin: 11.9 g/dL — ABNORMAL LOW (ref 12.0–15.0)
Immature Granulocytes: 1 %
Lymphocytes Relative: 13 %
Lymphs Abs: 1.3 10*3/uL (ref 0.7–4.0)
MCH: 31.9 pg (ref 26.0–34.0)
MCHC: 32.6 g/dL (ref 30.0–36.0)
MCV: 97.9 fL (ref 80.0–100.0)
Monocytes Absolute: 0.8 10*3/uL (ref 0.1–1.0)
Monocytes Relative: 7 %
Neutro Abs: 8 10*3/uL — ABNORMAL HIGH (ref 1.7–7.7)
Neutrophils Relative %: 77 %
Platelet Count: 505 10*3/uL — ABNORMAL HIGH (ref 150–400)
RBC: 3.73 MIL/uL — ABNORMAL LOW (ref 3.87–5.11)
RDW: 16 % — ABNORMAL HIGH (ref 11.5–15.5)
WBC Count: 10.4 10*3/uL (ref 4.0–10.5)
nRBC: 0 % (ref 0.0–0.2)

## 2021-03-20 LAB — CMP (CANCER CENTER ONLY)
ALT: 10 U/L (ref 0–44)
AST: 14 U/L — ABNORMAL LOW (ref 15–41)
Albumin: 4.1 g/dL (ref 3.5–5.0)
Alkaline Phosphatase: 113 U/L (ref 38–126)
Anion gap: 10 (ref 5–15)
BUN: 22 mg/dL (ref 8–23)
CO2: 23 mmol/L (ref 22–32)
Calcium: 9.4 mg/dL (ref 8.9–10.3)
Chloride: 106 mmol/L (ref 98–111)
Creatinine: 1.94 mg/dL — ABNORMAL HIGH (ref 0.44–1.00)
GFR, Estimated: 27 mL/min — ABNORMAL LOW (ref >60)
Glucose, Bld: 91 mg/dL (ref 70–99)
Potassium: 4.5 mmol/L (ref 3.5–5.1)
Sodium: 139 mmol/L (ref 135–145)
Total Bilirubin: 0.4 mg/dL (ref 0.3–1.2)
Total Protein: 7 g/dL (ref 6.5–8.1)

## 2021-03-20 LAB — FERRITIN: Ferritin: 136 ng/mL (ref 11–307)

## 2021-03-20 LAB — IRON AND TIBC
Iron: 82 ug/dL (ref 41–142)
Saturation Ratios: 31 % (ref 21–57)
TIBC: 268 ug/dL (ref 236–444)
UIBC: 186 ug/dL (ref 120–384)

## 2021-03-21 ENCOUNTER — Telehealth: Payer: Self-pay | Admitting: Hematology

## 2021-03-21 NOTE — Telephone Encounter (Signed)
Left message with follow-up appointment per 9/12 los.

## 2021-03-26 ENCOUNTER — Encounter: Payer: Self-pay | Admitting: Hematology

## 2021-03-26 NOTE — Progress Notes (Signed)
HEMATOLOGY/ONCOLOGY CLINIC NOTE  Date of Service: 03/26/21    Patient Care Team: Lawerance Cruel, MD as PCP - General (Family Medicine) [name], MD as PCP - Cardiology (Cardiology)  CHIEF COMPLAINTS/PURPOSE OF CONSULTATION:  F/u for mx of Jak2 MPN  HISTORY OF PRESENTING ILLNESS:   Alexis Murphy is a wonderful 75 y.o. 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 The pt reports that she is doing well overall. She has seen dermatology and rheumatology with work up of her sarcoidosis. She is seeing Dr Christinia Gully in one month in Pulmonology.   She notes that her skin changes first appeared 10 years ago.   The pt reports that her recent nose bx with [facility] a week ago revealed sarcoidosis and her right foot bx revealed stasis dermatitis. She has been diagnosed with sarcoidosis before this. She began a 20mg  prednisone 42-month-taper, three weeks ago with Dr. [name], and is also taking Plaquenil for her sarcoidosis. She had a CT on 09/19/17 to properly work up her sarcoidosis diagnosis.   She notes that prior to taking steroids her feet were very painful and also had some neuropathy. She also had night sweats and leg and ankle swelling but did not have fatigue or shortness of breath. These positive symptoms have resolved after beginning Prednisone.   She denies any breast symptoms. She has a MMG on 08/05/2017 which showed : IMPRESSION: New suspicious left breast mass 7:00 position retroareolar measuring 0.6 cm on ultrasound. Malignancy cannot be excluded. The mass does have similar imaging appearance to the previously biopsied left breast mass (January 2017, which demonstrated granulomatous inflammation and no evidence of malignancy at pathology.) The possibility of a second area of granulomatous inflammation is considered.   Borderline diffuse cortical thickening of a left axillary  lymph node. On review of the patient's prior CT a of the chest, patient does have mild diffuse lymphadenopathy in the thorax. At this time, axillary lymph node biopsy is not felt warranted.   The patient was given a copy of her pathology report from her left breast biopsy from January 2017. I told her she may want to share this information with her rheumatologist.  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   Of note prior to the patient's visit today, pt has had CT Chest completed on 09/19/17 with results revealing 1. Moderate mediastinal and right supraclavicular lymphadenopathy, increased. New mild bilateral axillary and bilateral hilar lymphadenopathy. Findings are worrisome for a malignant process such as a lymphoproliferative disorder or metastatic nodal disease. The right supraclavicular node may be (but not definitely) accessible for percutaneous biopsy. 2. Numerous similar-appearing subsolid spiculated pulmonary nodules scattered throughout both lungs, upper lobe predominant, largest 1.6 cm, stable to mildly increased in size and slightly increased in density in the interval. These are indeterminate, with multifocal lung adenocarcinoma on the differential. 3. Several new subcentimeter hypodense splenic lesions. Solitary small hypodense posterior right liver lobe lesion. These lesions are indeterminate, with lymphoproliferative disorder or metastatic disease not excluded. Options include further characterization with MRI abdomen without and with IV contrast or short-term follow-up CT abdomen with IV contrast in 3 months. 4. Chronic findings include: Left main and 3 vessel coronary atherosclerosis. Small hiatal hernia. Cholelithiasis.   Most recent lab results (03/10/17) of CBC  is as follows: all values are WNL except for WBC at 17.5k, MCV at 75.8, MCH at 25.2, RDW  at 17.6, Platelets at 804k.  On review of systems, pt reports dry mouth, decreased leg swelling, and  denies tingling, numbness in her hands or feet, dry eyes, problems breathing, cough, SOB, CP, nose bleeds, fatigue, abdominal pains, unexpected weight loss, fevers, chills, night sweats.   On PMHx the pt reports sarcoidosis, Bell's palsy twice.  On Social Hx the pt reports that she smoked cigarettes for 45 years. She quit smoking cigarettes 10 years ago but continues to smoke marijuana occasionally.  On Family Hx the pt reports maternal lupus.  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 The patient's last visit with [place] was on 01/12/2021 the pt reports that she is doing well overall.  The pt reports no new symptoms or concerns over the last three months. The patient is currently still on the baby ASA and the chronic steroids. She notes that she is down to 5 mg of prednisone p.o. daily.  Notes no new lumps or bumps.  No fevers no chills no night sweats.  No new bone pains.  No shortness of breath.  No abdominal pain or distention.  Lab results today 03/20/2021 of  CBC w/diff-hemoglobin is 11.9 with a WBC count of 10.4k platelets 505k  CMP shows chronic kidney disease with a creatinine of 1.94 otherwise unremarkable  Ferritin 136 with an iron saturation of 31%  No issues with tolerating hydroxyurea.  Lab Results  Component Value Date   IRON 82 03/20/2021   TIBC 268 03/20/2021   IRONPCTSAT 31 03/20/2021   (Iron and TIBC)  Lab Results  Component Value Date   FERRITIN 136 03/20/2021     On review of systems, pt reports recent joint pain and denies fevers, chills, infection issues, urinary issues, infection issues, and any other symptoms.   MEDICAL HISTORY:  Past Medical History:  Diagnosis Date   Arthritis    Hyperlipidemia    Sarcoidosis     SURGICAL HISTORY: Past Surgical History:  Procedure Laterality Date   ABDOMINAL HYSTERECTOMY     BREAST BIOPSY     BREAST SURGERY     REDUCTION MAMMAPLASTY      SOCIAL HISTORY: Social History    Socioeconomic History   Marital status: Widowed    Spouse name: Not on file   Number of children: Not on file   Years of education: Not on file   Highest education level: Not on file  Occupational History   Not on file  Tobacco Use   Smoking status: Former    Packs/day: 1.00    Years: 45.00    Pack years: 45.00    Types: Cigarettes    Quit date: 10/15/2007    Years since quitting: 13.4   Smokeless tobacco: Never  Vaping Use   Vaping Use: Never used  Substance and Sexual Activity   Alcohol use: Yes   Drug use: No   Sexual activity: Not on file  Other Topics Concern   Not on file  Social History Narrative   Not on file   Social Determinants of Health   Financial Resource Strain: Not on file  Food Insecurity: Not on file  Transportation Needs: Not on file  Physical Activity: Not on file  Stress: Not on file  Social Connections: Not on file  Intimate Partner Violence: Not on file    FAMILY HISTORY: Family History  Problem Relation Age of Onset   Heart disease Father    Heart disease Brother     ALLERGIES:  is allergic to penicillins and codeine.  MEDICATIONS:  Current Outpatient Medications  Medication Sig Dispense Refill   acetaminophen (TYLENOL) 325 MG tablet Take 1 tablet (325 mg total) by mouth every 6 (six) hours. (Patient taking differently: Take 325 mg by mouth every 6 (six) hours as needed for headache.) 30 tablet 0   amLODipine (NORVASC) 5 MG tablet Take 5 mg by mouth every evening.      aspirin EC 81 MG tablet Take 81 mg by mouth daily.     atorvastatin (LIPITOR) 40 MG tablet Take 40 mg by mouth every evening.      Cholecalciferol 25 MCG (1000 UT) tablet Take 2 tablets (2,000 Units total) by mouth daily. 60 tablet 5   famotidine (PEPCID) 20 MG tablet One after supper 30 tablet 11   folic acid (FOLVITE) 1 MG tablet Take 1 tablet (1 mg total) by mouth daily. 30 tablet 5   hydroxyurea (HYDREA) 500 MG capsule Take 500mg  po daily 4 days a week (Mon through  Thursday) . Do not take Friday, sat and Sunday. May take with food to minimize GI side effects. 30 capsule 3   iron polysaccharides (FERREX 150) 150 MG capsule Take 1 capsule (150 mg total) by mouth daily. 30 capsule 2   levothyroxine (SYNTHROID) 50 MCG tablet Take 50 mcg by mouth every morning.     Melatonin 5 MG TABS Take 5 mg by mouth at bedtime.     omeprazole (PRILOSEC) 40 MG capsule Take 40 mg by mouth every evening.      potassium chloride SA (KLOR-CON) 20 MEQ tablet TAKE ONE TABLET BY MOUTH ONCE DAILY 30 tablet 1   predniSONE (DELTASONE) 5 MG tablet Take 5 mg by mouth daily.     venlafaxine XR (EFFEXOR-XR) 150 MG 24 hr capsule Take 150 mg by mouth every evening.      No current facility-administered medications for this visit.    REVIEW OF SYSTEMS:   .10 Point review of Systems was done is negative except as noted above.   PHYSICAL EXAMINATION: ECOG FS:2 - Symptomatic, <50% confined to bed  Vitals:   03/20/21 1424  BP: 139/75  Pulse: 91  Resp: 18  Temp: 97.6 F (36.4 C)  SpO2: 97%    Wt Readings from Last 3 Encounters:  03/20/21 144 lb 8 oz (65.5 kg)  01/31/21 146 lb (66.2 kg)  01/12/21 141 lb 1.6 oz (64 kg)   Body mass index is 29.19 kg/m.   Marland Kitchen GENERAL:alert, in no acute distress and comfortable SKIN: no acute rashes, no significant lesions EYES: conjunctiva are pink and non-injected, sclera anicteric OROPHARYNX: MMM, no exudates, no oropharyngeal erythema or ulceration NECK: supple, no JVD LYMPH:  no palpable lymphadenopathy in the cervical, axillary or inguinal regions LUNGS: clear to auscultation b/l with normal respiratory effort HEART: regular rate & rhythm ABDOMEN:  normoactive bowel sounds , non tender, not distended. Extremity: no pedal edema PSYCH: alert & oriented x 3 with fluent speech NEURO: no focal motor/sensory deficits   LABORATORY DATA:  I have reviewed the data as listed  . CBC Latest Ref Rng & Units 03/20/2021 01/12/2021 09/12/2020  WBC  4.0 - 10.5 K/uL 10.4 14.7(H) 8.7  Hemoglobin 12.0 - 15.0 g/dL 11.9(L) 12.6 9.1(L)  Hematocrit 36.0 - 46.0 % 36.5 38.9 30.5(L)  Platelets 150 - 400 K/uL 505(H) 689(H) 422(H)    . CMP Latest Ref Rng & Units 03/20/2021 01/12/2021 09/12/2020  Glucose 70 - 99 mg/dL 91 139(H) 100(H)  BUN  8 - 23 mg/dL 22 21 27(H)  Creatinine 0.44 - 1.00 mg/dL 1.94(H) 1.66(H) 2.03(H)  Sodium 135 - 145 mmol/L 139 144 137  Potassium 3.5 - 5.1 mmol/L 4.5 3.6 3.3(L)  Chloride 98 - 111 mmol/L 106 106 102  CO2 22 - 32 mmol/L 23 25 26   Calcium 8.9 - 10.3 mg/dL 9.4 9.0 8.6(L)  Total Protein 6.5 - 8.1 g/dL 7.0 6.9 6.9  Total Bilirubin 0.3 - 1.2 mg/dL 0.4 0.5 0.2(L)  Alkaline Phos 38 - 126 U/L 113 109 104  AST 15 - 41 U/L 14(L) 13(L) 16  ALT 0 - 44 U/L 10 11 11    . Lab Results  Component Value Date   IRON 82 03/20/2021   TIBC 268 03/20/2021   IRONPCTSAT 31 03/20/2021   (Iron and TIBC)  Lab Results  Component Value Date   FERRITIN 136 03/20/2021           RADIOGRAPHIC STUDIES: I have personally reviewed the radiological images as listed and agreed with the findings in the report. No results found. CT chest 09/19/2017: IMPRESSION: 1. Moderate mediastinal and right supraclavicular lymphadenopathy, increased. New mild bilateral axillary and bilateral hilar lymphadenopathy. Findings are worrisome for a malignant process such as a lymphoproliferative disorder or metastatic nodal disease. The right supraclavicular node may be (but not definitely) accessible for percutaneous biopsy. 2. Numerous similar-appearing subsolid spiculated pulmonary nodules scattered throughout both lungs, upper lobe predominant, largest 1.6 cm, stable to mildly increased in size and slightly increased in density in the interval. These are indeterminate, with multifocal lung adenocarcinoma on the differential. 3. Several new subcentimeter hypodense splenic lesions. Solitary small hypodense posterior right liver lobe lesion. These  lesions are indeterminate, with lymphoproliferative disorder or metastatic disease not excluded. Options include further characterization with MRI abdomen without and with IV contrast or short-term follow-up CT abdomen with IV contrast in 3 months. 4. Chronic findings include: Left main and 3 vessel coronary atherosclerosis. Small hiatal hernia. Cholelithiasis.   Aortic Atherosclerosis (ICD10-I70.0).    Electronically Signed   By: Ilona Sorrel M.D.   On: 09/19/2017 11:54   ASSESSMENT & PLAN:   75 y.o. female with  1. Sarcoidosis Presented with Generalized lymphadenopathy with multiple pulmonary nodules CT Chest 09/19/2017: Moderate mediastinal and right supraclavicular lymphadenopathy, increased. New mild bilateral axillary and bilateral hilar lymphadenopathy. Findings are worrisome for a malignant process such as a lymphoproliferative disorder or metastatic nodal disease. The right supraclavicular node may be (but not definitely) accessible for percutaneous biopsy. 2. Numerous similar-appearing subsolid spiculated pulmonary nodules scattered throughout both lungs, upper lobe predominant, largest 1.6 cm, stable to mildly increased in size and slightly increased in density in the interval. These are indeterminate, with multifocal lung adenocarcinoma on the differential.  12/26/17 CT C/A/P revealed interval response to steroids in LNadenopathy   07/17/18 CT C/A/P revealed Continue improvement in mediastinal adenopathy. Lymph nodes are now not pathologic by size criteria. 2. Continued improvement in nodular and ground-glass densities particularly in the RIGHT upper lobe. 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  2. Breast - lesion - Bx -- granulomatous inflammation -consistent with sarcoidosis.  3. Skin rash on nose - Bx consistent with sarcoidosis.  4. H/o  Iron deficiency - no significant anemia . Lab Results  Component Value Date   IRON 82 03/20/2021   TIBC 268  03/20/2021   IRONPCTSAT 31 03/20/2021   (Iron and TIBC)  Lab Results  Component Value Date   FERRITIN 136 03/20/2021   5.  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 Iron deficiency anemia   PLAN: -Discussed pt labwork today, 03/20/2021;  CBC w/diff-hemoglobin is 11.9 with a WBC count of 10.4k platelets 505k  CMP shows chronic kidney disease with a creatinine of 1.94 otherwise unremarkable  Ferritin 136 with an iron saturation of 31% -Continue the Hydroxyurea dosage to 500 mg 4x per week. The pt has no prohibitive toxicities at this time. -Continue maintenance iron polysaccharide once daily. -Continue Potassium, Folic Acid, Iron. -Continue 2000 IU Vitamin D daily -Continue daily baby Aspirin -Will see back in 3 months with labs.   FOLLOW UP: RTC with Dr Irene Limbo with labs in 3 months  . The total time spent in the appointment was 20 minutes and more than 50% was on counseling and direct patient cares.   All of the patient's questions were answered with apparent satisfaction. The patient knows to call the clinic with any problems, questions or concerns.  Sullivan Lone MD MS Hematology/Oncology Physician [facility]

## 2021-04-06 DIAGNOSIS — E7800 Pure hypercholesterolemia, unspecified: Secondary | ICD-10-CM | POA: Diagnosis not present

## 2021-04-06 DIAGNOSIS — N183 Chronic kidney disease, stage 3 unspecified: Secondary | ICD-10-CM | POA: Diagnosis not present

## 2021-04-06 DIAGNOSIS — M1990 Unspecified osteoarthritis, unspecified site: Secondary | ICD-10-CM | POA: Diagnosis not present

## 2021-04-06 DIAGNOSIS — R69 Illness, unspecified: Secondary | ICD-10-CM | POA: Diagnosis not present

## 2021-04-06 DIAGNOSIS — K219 Gastro-esophageal reflux disease without esophagitis: Secondary | ICD-10-CM | POA: Diagnosis not present

## 2021-04-06 DIAGNOSIS — E039 Hypothyroidism, unspecified: Secondary | ICD-10-CM | POA: Diagnosis not present

## 2021-04-06 DIAGNOSIS — M8580 Other specified disorders of bone density and structure, unspecified site: Secondary | ICD-10-CM | POA: Diagnosis not present

## 2021-04-07 ENCOUNTER — Encounter: Payer: Self-pay | Admitting: Hematology

## 2021-04-07 ENCOUNTER — Other Ambulatory Visit: Payer: Self-pay

## 2021-04-07 ENCOUNTER — Ambulatory Visit
Admission: RE | Admit: 2021-04-07 | Discharge: 2021-04-07 | Disposition: A | Discharge status: Home / Self Care | Payer: Medicare HMO | Source: Ambulatory Visit | Attending: Family Medicine | Admitting: Family Medicine

## 2021-04-07 DIAGNOSIS — Z1231 Encounter for screening mammogram for malignant neoplasm of breast: Secondary | ICD-10-CM | POA: Diagnosis not present

## 2021-04-13 DIAGNOSIS — K219 Gastro-esophageal reflux disease without esophagitis: Secondary | ICD-10-CM | POA: Diagnosis not present

## 2021-04-13 DIAGNOSIS — M1990 Unspecified osteoarthritis, unspecified site: Secondary | ICD-10-CM | POA: Diagnosis not present

## 2021-04-13 DIAGNOSIS — N183 Chronic kidney disease, stage 3 unspecified: Secondary | ICD-10-CM | POA: Diagnosis not present

## 2021-04-13 DIAGNOSIS — E039 Hypothyroidism, unspecified: Secondary | ICD-10-CM | POA: Diagnosis not present

## 2021-04-13 DIAGNOSIS — E7800 Pure hypercholesterolemia, unspecified: Secondary | ICD-10-CM | POA: Diagnosis not present

## 2021-04-13 DIAGNOSIS — R69 Illness, unspecified: Secondary | ICD-10-CM | POA: Diagnosis not present

## 2021-04-13 DIAGNOSIS — M8580 Other specified disorders of bone density and structure, unspecified site: Secondary | ICD-10-CM | POA: Diagnosis not present

## 2021-04-30 ENCOUNTER — Other Ambulatory Visit: Payer: Self-pay | Admitting: Hematology

## 2021-04-30 DIAGNOSIS — D473 Essential (hemorrhagic) thrombocythemia: Secondary | ICD-10-CM

## 2021-04-30 DIAGNOSIS — E876 Hypokalemia: Secondary | ICD-10-CM

## 2021-05-01 ENCOUNTER — Encounter: Payer: Self-pay | Admitting: Hematology

## 2021-05-25 DIAGNOSIS — M1990 Unspecified osteoarthritis, unspecified site: Secondary | ICD-10-CM | POA: Diagnosis not present

## 2021-05-25 DIAGNOSIS — E7800 Pure hypercholesterolemia, unspecified: Secondary | ICD-10-CM | POA: Diagnosis not present

## 2021-05-25 DIAGNOSIS — N183 Chronic kidney disease, stage 3 unspecified: Secondary | ICD-10-CM | POA: Diagnosis not present

## 2021-05-25 DIAGNOSIS — E039 Hypothyroidism, unspecified: Secondary | ICD-10-CM | POA: Diagnosis not present

## 2021-05-25 DIAGNOSIS — K219 Gastro-esophageal reflux disease without esophagitis: Secondary | ICD-10-CM | POA: Diagnosis not present

## 2021-05-25 DIAGNOSIS — M8580 Other specified disorders of bone density and structure, unspecified site: Secondary | ICD-10-CM | POA: Diagnosis not present

## 2021-05-25 DIAGNOSIS — R69 Illness, unspecified: Secondary | ICD-10-CM | POA: Diagnosis not present

## 2021-05-27 ENCOUNTER — Other Ambulatory Visit: Payer: Self-pay | Admitting: Hematology

## 2021-05-29 ENCOUNTER — Encounter: Payer: Self-pay | Admitting: Hematology

## 2021-06-06 ENCOUNTER — Other Ambulatory Visit: Payer: Self-pay | Admitting: Hematology

## 2021-06-06 DIAGNOSIS — D473 Essential (hemorrhagic) thrombocythemia: Secondary | ICD-10-CM

## 2021-06-06 DIAGNOSIS — E876 Hypokalemia: Secondary | ICD-10-CM

## 2021-06-16 ENCOUNTER — Other Ambulatory Visit: Payer: Self-pay

## 2021-06-16 DIAGNOSIS — D471 Chronic myeloproliferative disease: Secondary | ICD-10-CM

## 2021-06-19 ENCOUNTER — Inpatient Hospital Stay: Payer: Medicare HMO | Attending: Hematology | Admitting: Hematology

## 2021-06-19 ENCOUNTER — Other Ambulatory Visit: Payer: Self-pay

## 2021-06-19 ENCOUNTER — Inpatient Hospital Stay: Payer: Medicare HMO

## 2021-06-19 VITALS — BP 140/72 | HR 89 | Temp 98.1°F | Resp 17 | Ht 59.0 in | Wt 138.5 lb

## 2021-06-19 DIAGNOSIS — N189 Chronic kidney disease, unspecified: Secondary | ICD-10-CM | POA: Insufficient documentation

## 2021-06-19 DIAGNOSIS — Z7982 Long term (current) use of aspirin: Secondary | ICD-10-CM | POA: Diagnosis not present

## 2021-06-19 DIAGNOSIS — D471 Chronic myeloproliferative disease: Secondary | ICD-10-CM

## 2021-06-19 DIAGNOSIS — Z1589 Genetic susceptibility to other disease: Secondary | ICD-10-CM | POA: Diagnosis not present

## 2021-06-19 DIAGNOSIS — Z862 Personal history of diseases of the blood and blood-forming organs and certain disorders involving the immune mechanism: Secondary | ICD-10-CM | POA: Diagnosis not present

## 2021-06-19 DIAGNOSIS — D509 Iron deficiency anemia, unspecified: Secondary | ICD-10-CM

## 2021-06-19 DIAGNOSIS — D869 Sarcoidosis, unspecified: Secondary | ICD-10-CM | POA: Insufficient documentation

## 2021-06-19 DIAGNOSIS — Z87891 Personal history of nicotine dependence: Secondary | ICD-10-CM | POA: Diagnosis not present

## 2021-06-19 DIAGNOSIS — Z79899 Other long term (current) drug therapy: Secondary | ICD-10-CM | POA: Diagnosis not present

## 2021-06-19 LAB — CBC WITH DIFFERENTIAL (CANCER CENTER ONLY)
Abs Immature Granulocytes: 0.08 10*3/uL — ABNORMAL HIGH (ref 0.00–0.07)
Basophils Absolute: 0.1 10*3/uL (ref 0.0–0.1)
Basophils Relative: 1 %
Eosinophils Absolute: 0.1 10*3/uL (ref 0.0–0.5)
Eosinophils Relative: 1 %
HCT: 35.6 % — ABNORMAL LOW (ref 36.0–46.0)
Hemoglobin: 11.5 g/dL — ABNORMAL LOW (ref 12.0–15.0)
Immature Granulocytes: 1 %
Lymphocytes Relative: 18 %
Lymphs Abs: 1.7 10*3/uL (ref 0.7–4.0)
MCH: 32.2 pg (ref 26.0–34.0)
MCHC: 32.3 g/dL (ref 30.0–36.0)
MCV: 99.7 fL (ref 80.0–100.0)
Monocytes Absolute: 0.7 10*3/uL (ref 0.1–1.0)
Monocytes Relative: 8 %
Neutro Abs: 6.8 10*3/uL (ref 1.7–7.7)
Neutrophils Relative %: 71 %
Platelet Count: 497 10*3/uL — ABNORMAL HIGH (ref 150–400)
RBC: 3.57 MIL/uL — ABNORMAL LOW (ref 3.87–5.11)
RDW: 15.4 % (ref 11.5–15.5)
WBC Count: 9.5 10*3/uL (ref 4.0–10.5)
nRBC: 0 % (ref 0.0–0.2)

## 2021-06-19 LAB — CMP (CANCER CENTER ONLY)
ALT: 10 U/L (ref 0–44)
AST: 14 U/L — ABNORMAL LOW (ref 15–41)
Albumin: 4.1 g/dL (ref 3.5–5.0)
Alkaline Phosphatase: 111 U/L (ref 38–126)
Anion gap: 11 (ref 5–15)
BUN: 21 mg/dL (ref 8–23)
CO2: 21 mmol/L — ABNORMAL LOW (ref 22–32)
Calcium: 8.7 mg/dL — ABNORMAL LOW (ref 8.9–10.3)
Chloride: 106 mmol/L (ref 98–111)
Creatinine: 1.77 mg/dL — ABNORMAL HIGH (ref 0.44–1.00)
GFR, Estimated: 30 mL/min — ABNORMAL LOW
Glucose, Bld: 110 mg/dL — ABNORMAL HIGH (ref 70–99)
Potassium: 4 mmol/L (ref 3.5–5.1)
Sodium: 138 mmol/L (ref 135–145)
Total Bilirubin: 0.5 mg/dL (ref 0.3–1.2)
Total Protein: 6.8 g/dL (ref 6.5–8.1)

## 2021-06-19 LAB — IRON AND TIBC
Iron: 62 ug/dL (ref 41–142)
Saturation Ratios: 23 % (ref 21–57)
TIBC: 268 ug/dL (ref 236–444)
UIBC: 205 ug/dL (ref 120–384)

## 2021-06-19 LAB — FERRITIN: Ferritin: 180 ng/mL (ref 11–307)

## 2021-06-20 ENCOUNTER — Telehealth: Payer: Self-pay | Admitting: Hematology

## 2021-06-20 ENCOUNTER — Encounter: Payer: Self-pay | Admitting: Hematology

## 2021-06-20 NOTE — Telephone Encounter (Signed)
Left message with follow-up appointment per 12/12 los.

## 2021-06-20 NOTE — Progress Notes (Signed)
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 Her last clinic visit with [place] was about 3 months ago on 03/20/2021. She notes no acute concerns. Continues to follow-up with rheumatology for her multisystem systemic sarcoidosis and is on prednisone 5 mg p.o. daily and leflunomide.  Patient notes no acute new symptoms.  No new leg swelling or pain.  No chest pain or shortness of breath.  No new lumps or bumps.  No new bone pains.  Notes no notable issues tolerating her current dose of hydroxyurea. Labs done today were reviewed in detail with the patient.   MEDICAL HISTORY:  Past Medical History:  Diagnosis Date   Arthritis    Hyperlipidemia    Sarcoidosis     SURGICAL HISTORY: Past Surgical History:  Procedure Laterality Date   ABDOMINAL HYSTERECTOMY     BREAST BIOPSY     BREAST SURGERY     REDUCTION MAMMAPLASTY      SOCIAL HISTORY: Social History   Socioeconomic History   Marital status: Widowed    Spouse name: Not on file   Number of children: Not on file   Years of education: Not on file   Highest education level: Not on file  Occupational History   Not on file  Tobacco Use   Smoking status: Former    Packs/day: 1.00    Years: 45.00    Pack years: 45.00    Types: Cigarettes    Quit date: 10/15/2007    Years since quitting: 13.6   Smokeless tobacco: Never  Vaping Use   Vaping Use: Never used  Substance and Sexual Activity   Alcohol use: Yes   Drug use: No   Sexual activity: Not on file  Other  Topics Concern   Not on file  Social History Narrative   Not on file   Social Determinants of Health   Financial Resource Strain: Not on file  Food Insecurity: Not on file  Transportation Needs: Not on file  Physical Activity: Not on file  Stress: Not on file  Social Connections: Not on file  Intimate Partner Violence: Not on file    FAMILY HISTORY: Family History  Problem Relation Age of Onset   Heart disease Father    Heart disease Brother     ALLERGIES:  is allergic to penicillins and codeine.  MEDICATIONS:  Current Outpatient Medications  Medication Sig Dispense Refill   acetaminophen (TYLENOL) 325 MG tablet Take 1 tablet (325 mg total) by mouth every 6 (six) hours. (Patient taking differently: Take 325 mg by mouth every 6 (six) hours as needed for headache.) 30 tablet 0   amLODipine (NORVASC) 5 MG tablet Take 5 mg by mouth every evening.      aspirin EC 81 MG tablet Take 81 mg by mouth daily.     atorvastatin (LIPITOR) 40 MG tablet Take 40 mg by mouth every evening.      Cholecalciferol (VITAMIN D3) 25 MCG (1000 UT) CAPS TAKE TWO CAPSULES BY MOUTH ONCE DAILY 60 capsule 5   famotidine (PEPCID) 20 MG tablet One  after supper 30 tablet 11   FERREX 150 150 MG capsule TAKE ONE CAPSULE BY MOUTH ONCE DAILY 30 capsule 2   folic acid (FOLVITE) 1 MG tablet TAKE ONE TABLET BY MOUTH ONCE DAILY 30 tablet 5   hydroxyurea (HYDREA) 500 MG capsule Take 500mg  po daily 4 days a week (Mon through Thursday) . Do not take Friday, sat and Sunday. May take with food to minimize GI side effects. 30 capsule 3   levothyroxine (SYNTHROID) 50 MCG tablet Take 50 mcg by mouth every morning.     Melatonin 5 MG TABS Take 5 mg by mouth at bedtime.     omeprazole (PRILOSEC) 40 MG capsule Take 40 mg by mouth every evening.      potassium chloride SA (KLOR-CON M) 20 MEQ tablet TAKE ONE TABLET BY MOUTH ONCE DAILY 30 tablet 1   predniSONE (DELTASONE) 5 MG tablet Take 5 mg by mouth daily.     venlafaxine XR  (EFFEXOR-XR) 150 MG 24 hr capsule Take 150 mg by mouth every evening.      No current facility-administered medications for this visit.    REVIEW OF SYSTEMS:   .10 Point review of Systems was done is negative except as noted above.    PHYSICAL EXAMINATION: ECOG FS:2 - Symptomatic, <50% confined to bed  Vitals:   06/19/21 1406  BP: 140/72  Pulse: 89  Resp: 17  Temp: 98.1 F (36.7 C)  SpO2: 95%    Wt Readings from Last 3 Encounters:  06/19/21 138 lb 8 oz (62.8 kg)  03/20/21 144 lb 8 oz (65.5 kg)  01/31/21 146 lb (66.2 kg)   Body mass index is 27.97 kg/m.    GENERAL:alert, in no acute distress and comfortable SKIN: no acute rashes, no significant lesions EYES: conjunctiva are pink and non-injected, sclera anicteric OROPHARYNX: MMM, no exudates, no oropharyngeal erythema or ulceration NECK: supple, no JVD LYMPH:  no palpable lymphadenopathy in the cervical, axillary or inguinal regions LUNGS: clear to auscultation b/l with normal respiratory effort HEART: regular rate & rhythm ABDOMEN:  normoactive bowel sounds , non tender, not distended. Extremity: no pedal edema PSYCH: alert & oriented x 3 with fluent speech NEURO: no focal motor/sensory deficits  LABORATORY DATA:  I have reviewed the data as listed  CBC Latest Ref Rng & Units 06/19/2021 03/20/2021 01/12/2021  WBC 4.0 - 10.5 K/uL 9.5 10.4 14.7(H)  Hemoglobin 12.0 - 15.0 g/dL 11.5(L) 11.9(L) 12.6  Hematocrit 36.0 - 46.0 % 35.6(L) 36.5 38.9  Platelets 150 - 400 K/uL 497(H) 505(H) 689(H)    . CMP Latest Ref Rng & Units 06/19/2021 03/20/2021 01/12/2021  Glucose 70 - 99 mg/dL 110(H) 91 139(H)  BUN 8 - 23 mg/dL 21 22 21   Creatinine 0.44 - 1.00 mg/dL 1.77(H) 1.94(H) 1.66(H)  Sodium 135 - 145 mmol/L 138 139 144  Potassium 3.5 - 5.1 mmol/L 4.0 4.5 3.6  Chloride 98 - 111 mmol/L 106 106 106  CO2 22 - 32 mmol/L 21(L) 23 25  Calcium 8.9 - 10.3 mg/dL 8.7(L) 9.4 9.0  Total Protein 6.5 - 8.1 g/dL 6.8 7.0 6.9  Total Bilirubin  0.3 - 1.2 mg/dL 0.5 0.4 0.5  Alkaline Phos 38 - 126 U/L 111 113 109  AST 15 - 41 U/L 14(L) 14(L) 13(L)  ALT 0 - 44 U/L 10 10 11    . Lab Results  Component Value Date   IRON 62 06/19/2021   TIBC 268 06/19/2021   IRONPCTSAT 23 06/19/2021   (Iron and TIBC)  Lab Results  Component Value Date   FERRITIN 180 06/19/2021           RADIOGRAPHIC STUDIES: I have personally reviewed the radiological images as listed and agreed with the findings in the report. No results found. CT chest 09/19/2017: IMPRESSION: 1. Moderate mediastinal and right supraclavicular lymphadenopathy, increased. New mild bilateral axillary and bilateral hilar lymphadenopathy. Findings are worrisome for a malignant process such as a lymphoproliferative disorder or metastatic nodal disease. The right supraclavicular node may be (but not definitely) accessible for percutaneous biopsy. 2. Numerous similar-appearing subsolid spiculated pulmonary nodules scattered throughout both lungs, upper lobe predominant, largest 1.6 cm, stable to mildly increased in size and slightly increased in density in the interval. These are indeterminate, with multifocal lung adenocarcinoma on the differential. 3. Several new subcentimeter hypodense splenic lesions. Solitary small hypodense posterior right liver lobe lesion. These lesions are indeterminate, with lymphoproliferative disorder or metastatic disease not excluded. Options include further characterization with MRI abdomen without and with IV contrast or short-term follow-up CT abdomen with IV contrast in 3 months. 4. Chronic findings include: Left main and 3 vessel coronary atherosclerosis. Small hiatal hernia. Cholelithiasis.   Aortic Atherosclerosis (ICD10-I70.0).    Electronically Signed   By: Ilona Sorrel M.D.   On: 09/19/2017 11:54   ASSESSMENT & PLAN:   75 y.o. female with  1.  Multisystem sarcoidosis -follows with rheumatology Dr. Amil Amen Presented  with Generalized lymphadenopathy with multiple pulmonary nodules CT Chest 09/19/2017: Moderate mediastinal and right supraclavicular lymphadenopathy, increased. New mild bilateral axillary and bilateral hilar lymphadenopathy. Findings are worrisome for a malignant process such as a lymphoproliferative disorder or metastatic nodal disease. The right supraclavicular node may be (but not definitely) accessible for percutaneous biopsy. 2. Numerous similar-appearing subsolid spiculated pulmonary nodules scattered throughout both lungs, upper lobe predominant, largest 1.6 cm, stable to mildly increased in size and slightly increased in density in the interval. These are indeterminate, with multifocal lung adenocarcinoma on the differential.  12/26/17 CT C/A/P revealed interval response to steroids in LNadenopathy   07/17/18 CT C/A/P revealed Continue improvement in mediastinal adenopathy. Lymph nodes are now not pathologic by size criteria. 2. Continued improvement in nodular and ground-glass densities particularly in the RIGHT upper lobe. 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  2. Breast - lesion - Bx -- granulomatous inflammation -consistent with sarcoidosis.  3. Skin rash on nose - Bx consistent with sarcoidosis. PLAN -Continue follow-up with Dr. Amil Amen. -Patient is currently on prednisone 5 mg p.o. daily and leflunomide.  4. H/o  Iron deficiency - no significant anemia . Lab Results  Component Value Date   IRON 62 06/19/2021   TIBC 268 06/19/2021   IRONPCTSAT 23 06/19/2021   (Iron and TIBC)  Lab Results  Component Value Date   FERRITIN 180 06/19/2021  PLAN -Iron levels within normal limits -No indication for IV iron replacement at this time. -Currently on oral Ferrex and can continue this current iron saturation at about 30% and ferritin about 250 given her chronic kidney disease.  5.  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  Platelets  are improved at 497k.  Hemoglobin 11.5, WBC count within normal limits. CMP shows stable chronic kidney disease -Given her fluctuating renal function, reactive component to the thrombocytosis due to inflammation from sarcoidosis and her previous history of excessive bone marrow suppression we are being cautious with her hydroxyurea dose. -We will continue current dose of hydroxyurea at 500 mg 4 days a week. -No issues with notable toxicities from her current dose of hydroxyurea. -Symptoms suggestive of VTE or bleeding at this time. -Continue daily baby aspirin -Continue B complex and folic acid -We will monitor labs again in 3 months   FOLLOW UP: RTC with Dr Irene Limbo with labs in 3 months  All of the patient's questions were answered with apparent satisfaction. The patient knows to call the clinic with any problems, questions or concerns.  Sullivan Lone MD MS Hematology/Oncology Physician [facility]

## 2021-07-07 DIAGNOSIS — K219 Gastro-esophageal reflux disease without esophagitis: Secondary | ICD-10-CM | POA: Diagnosis not present

## 2021-07-07 DIAGNOSIS — E039 Hypothyroidism, unspecified: Secondary | ICD-10-CM | POA: Diagnosis not present

## 2021-07-07 DIAGNOSIS — R69 Illness, unspecified: Secondary | ICD-10-CM | POA: Diagnosis not present

## 2021-07-07 DIAGNOSIS — M8580 Other specified disorders of bone density and structure, unspecified site: Secondary | ICD-10-CM | POA: Diagnosis not present

## 2021-07-07 DIAGNOSIS — E7800 Pure hypercholesterolemia, unspecified: Secondary | ICD-10-CM | POA: Diagnosis not present

## 2021-07-07 DIAGNOSIS — N183 Chronic kidney disease, stage 3 unspecified: Secondary | ICD-10-CM | POA: Diagnosis not present

## 2021-07-07 DIAGNOSIS — M1990 Unspecified osteoarthritis, unspecified site: Secondary | ICD-10-CM | POA: Diagnosis not present

## 2021-07-12 DIAGNOSIS — M150 Primary generalized (osteo)arthritis: Secondary | ICD-10-CM | POA: Diagnosis not present

## 2021-07-12 DIAGNOSIS — Z1589 Genetic susceptibility to other disease: Secondary | ICD-10-CM | POA: Diagnosis not present

## 2021-07-12 DIAGNOSIS — M8580 Other specified disorders of bone density and structure, unspecified site: Secondary | ICD-10-CM | POA: Diagnosis not present

## 2021-07-12 DIAGNOSIS — Z7952 Long term (current) use of systemic steroids: Secondary | ICD-10-CM | POA: Diagnosis not present

## 2021-07-12 DIAGNOSIS — I7300 Raynaud's syndrome without gangrene: Secondary | ICD-10-CM | POA: Diagnosis not present

## 2021-07-12 DIAGNOSIS — D473 Essential (hemorrhagic) thrombocythemia: Secondary | ICD-10-CM | POA: Diagnosis not present

## 2021-07-12 DIAGNOSIS — D869 Sarcoidosis, unspecified: Secondary | ICD-10-CM | POA: Diagnosis not present

## 2021-07-12 DIAGNOSIS — L52 Erythema nodosum: Secondary | ICD-10-CM | POA: Diagnosis not present

## 2021-07-12 DIAGNOSIS — H348112 Central retinal vein occlusion, right eye, stable: Secondary | ICD-10-CM | POA: Diagnosis not present

## 2021-07-12 DIAGNOSIS — N1832 Chronic kidney disease, stage 3b: Secondary | ICD-10-CM | POA: Diagnosis not present

## 2021-08-16 DIAGNOSIS — R413 Other amnesia: Secondary | ICD-10-CM | POA: Diagnosis not present

## 2021-08-16 DIAGNOSIS — I10 Essential (primary) hypertension: Secondary | ICD-10-CM | POA: Diagnosis not present

## 2021-08-16 DIAGNOSIS — I739 Peripheral vascular disease, unspecified: Secondary | ICD-10-CM | POA: Diagnosis not present

## 2021-08-17 ENCOUNTER — Other Ambulatory Visit: Payer: Self-pay | Admitting: Hematology

## 2021-08-17 DIAGNOSIS — E876 Hypokalemia: Secondary | ICD-10-CM

## 2021-08-17 DIAGNOSIS — D473 Essential (hemorrhagic) thrombocythemia: Secondary | ICD-10-CM

## 2021-08-19 DIAGNOSIS — M810 Age-related osteoporosis without current pathological fracture: Secondary | ICD-10-CM | POA: Diagnosis not present

## 2021-08-19 DIAGNOSIS — D860 Sarcoidosis of lung: Secondary | ICD-10-CM | POA: Diagnosis not present

## 2021-08-19 DIAGNOSIS — R69 Illness, unspecified: Secondary | ICD-10-CM | POA: Diagnosis not present

## 2021-08-19 DIAGNOSIS — K219 Gastro-esophageal reflux disease without esophagitis: Secondary | ICD-10-CM | POA: Diagnosis not present

## 2021-08-19 DIAGNOSIS — Z008 Encounter for other general examination: Secondary | ICD-10-CM | POA: Diagnosis not present

## 2021-08-19 DIAGNOSIS — F325 Major depressive disorder, single episode, in full remission: Secondary | ICD-10-CM | POA: Diagnosis not present

## 2021-08-19 DIAGNOSIS — G8929 Other chronic pain: Secondary | ICD-10-CM | POA: Diagnosis not present

## 2021-08-19 DIAGNOSIS — E039 Hypothyroidism, unspecified: Secondary | ICD-10-CM | POA: Diagnosis not present

## 2021-08-19 DIAGNOSIS — D84821 Immunodeficiency due to drugs: Secondary | ICD-10-CM | POA: Diagnosis not present

## 2021-08-19 DIAGNOSIS — I10 Essential (primary) hypertension: Secondary | ICD-10-CM | POA: Diagnosis not present

## 2021-08-19 DIAGNOSIS — F419 Anxiety disorder, unspecified: Secondary | ICD-10-CM | POA: Diagnosis not present

## 2021-08-19 DIAGNOSIS — G629 Polyneuropathy, unspecified: Secondary | ICD-10-CM | POA: Diagnosis not present

## 2021-08-19 DIAGNOSIS — E785 Hyperlipidemia, unspecified: Secondary | ICD-10-CM | POA: Diagnosis not present

## 2021-08-19 DIAGNOSIS — I739 Peripheral vascular disease, unspecified: Secondary | ICD-10-CM | POA: Diagnosis not present

## 2021-08-21 ENCOUNTER — Other Ambulatory Visit: Payer: Self-pay | Admitting: Hematology

## 2021-08-22 DIAGNOSIS — R69 Illness, unspecified: Secondary | ICD-10-CM | POA: Diagnosis not present

## 2021-08-22 DIAGNOSIS — E7800 Pure hypercholesterolemia, unspecified: Secondary | ICD-10-CM | POA: Diagnosis not present

## 2021-08-22 DIAGNOSIS — E039 Hypothyroidism, unspecified: Secondary | ICD-10-CM | POA: Diagnosis not present

## 2021-08-22 DIAGNOSIS — N1832 Chronic kidney disease, stage 3b: Secondary | ICD-10-CM | POA: Diagnosis not present

## 2021-08-25 ENCOUNTER — Other Ambulatory Visit: Payer: Self-pay | Admitting: Hematology

## 2021-09-06 DIAGNOSIS — J449 Chronic obstructive pulmonary disease, unspecified: Secondary | ICD-10-CM | POA: Diagnosis not present

## 2021-09-06 DIAGNOSIS — I10 Essential (primary) hypertension: Secondary | ICD-10-CM | POA: Diagnosis not present

## 2021-09-06 DIAGNOSIS — Z79899 Other long term (current) drug therapy: Secondary | ICD-10-CM | POA: Diagnosis not present

## 2021-09-12 DIAGNOSIS — H5203 Hypermetropia, bilateral: Secondary | ICD-10-CM | POA: Diagnosis not present

## 2021-09-12 DIAGNOSIS — H2513 Age-related nuclear cataract, bilateral: Secondary | ICD-10-CM | POA: Diagnosis not present

## 2021-09-13 DIAGNOSIS — N1832 Chronic kidney disease, stage 3b: Secondary | ICD-10-CM | POA: Diagnosis not present

## 2021-09-13 DIAGNOSIS — I10 Essential (primary) hypertension: Secondary | ICD-10-CM | POA: Diagnosis not present

## 2021-09-15 ENCOUNTER — Other Ambulatory Visit: Payer: Self-pay

## 2021-09-15 DIAGNOSIS — D471 Chronic myeloproliferative disease: Secondary | ICD-10-CM

## 2021-09-18 ENCOUNTER — Other Ambulatory Visit: Payer: Self-pay

## 2021-09-18 ENCOUNTER — Inpatient Hospital Stay: Payer: Medicare HMO

## 2021-09-18 ENCOUNTER — Inpatient Hospital Stay: Payer: Medicare HMO | Attending: Hematology | Admitting: Hematology

## 2021-09-18 VITALS — BP 151/67 | HR 89 | Temp 96.1°F | Resp 17 | Wt 133.1 lb

## 2021-09-18 DIAGNOSIS — Z862 Personal history of diseases of the blood and blood-forming organs and certain disorders involving the immune mechanism: Secondary | ICD-10-CM | POA: Diagnosis not present

## 2021-09-18 DIAGNOSIS — Z79899 Other long term (current) drug therapy: Secondary | ICD-10-CM | POA: Insufficient documentation

## 2021-09-18 DIAGNOSIS — Z1589 Genetic susceptibility to other disease: Secondary | ICD-10-CM | POA: Insufficient documentation

## 2021-09-18 DIAGNOSIS — Z7982 Long term (current) use of aspirin: Secondary | ICD-10-CM | POA: Diagnosis not present

## 2021-09-18 DIAGNOSIS — D869 Sarcoidosis, unspecified: Secondary | ICD-10-CM | POA: Insufficient documentation

## 2021-09-18 DIAGNOSIS — N189 Chronic kidney disease, unspecified: Secondary | ICD-10-CM | POA: Insufficient documentation

## 2021-09-18 DIAGNOSIS — D471 Chronic myeloproliferative disease: Secondary | ICD-10-CM | POA: Diagnosis not present

## 2021-09-18 DIAGNOSIS — Z87891 Personal history of nicotine dependence: Secondary | ICD-10-CM | POA: Insufficient documentation

## 2021-09-18 LAB — CMP (CANCER CENTER ONLY)
ALT: 10 U/L (ref 0–44)
AST: 14 U/L — ABNORMAL LOW (ref 15–41)
Albumin: 4.3 g/dL (ref 3.5–5.0)
Alkaline Phosphatase: 127 U/L — ABNORMAL HIGH (ref 38–126)
Anion gap: 9 (ref 5–15)
BUN: 20 mg/dL (ref 8–23)
CO2: 24 mmol/L (ref 22–32)
Calcium: 8.9 mg/dL (ref 8.9–10.3)
Chloride: 105 mmol/L (ref 98–111)
Creatinine: 1.49 mg/dL — ABNORMAL HIGH (ref 0.44–1.00)
GFR, Estimated: 36 mL/min — ABNORMAL LOW (ref >60)
Glucose, Bld: 90 mg/dL (ref 70–99)
Potassium: 3.9 mmol/L (ref 3.5–5.1)
Sodium: 138 mmol/L (ref 135–145)
Total Bilirubin: 0.3 mg/dL (ref 0.3–1.2)
Total Protein: 6.6 g/dL (ref 6.5–8.1)

## 2021-09-18 LAB — CBC WITH DIFFERENTIAL (CANCER CENTER ONLY)
Abs Immature Granulocytes: 0.11 10*3/uL — ABNORMAL HIGH (ref 0.00–0.07)
Basophils Absolute: 0.1 10*3/uL (ref 0.0–0.1)
Basophils Relative: 1 %
Eosinophils Absolute: 0.2 10*3/uL (ref 0.0–0.5)
Eosinophils Relative: 2 %
HCT: 34.1 % — ABNORMAL LOW (ref 36.0–46.0)
Hemoglobin: 11 g/dL — ABNORMAL LOW (ref 12.0–15.0)
Immature Granulocytes: 1 %
Lymphocytes Relative: 14 %
Lymphs Abs: 1.4 10*3/uL (ref 0.7–4.0)
MCH: 31.9 pg (ref 26.0–34.0)
MCHC: 32.3 g/dL (ref 30.0–36.0)
MCV: 98.8 fL (ref 80.0–100.0)
Monocytes Absolute: 1 10*3/uL (ref 0.1–1.0)
Monocytes Relative: 10 %
Neutro Abs: 7.3 10*3/uL (ref 1.7–7.7)
Neutrophils Relative %: 72 %
Platelet Count: 457 10*3/uL — ABNORMAL HIGH (ref 150–400)
RBC: 3.45 MIL/uL — ABNORMAL LOW (ref 3.87–5.11)
RDW: 14.7 % (ref 11.5–15.5)
WBC Count: 9.9 10*3/uL (ref 4.0–10.5)
nRBC: 0 % (ref 0.0–0.2)

## 2021-09-18 LAB — IRON AND IRON BINDING CAPACITY (CC-WL,HP ONLY)
Iron: 81 ug/dL (ref 28–170)
Saturation Ratios: 29 % (ref 10.4–31.8)
TIBC: 283 ug/dL (ref 250–450)
UIBC: 202 ug/dL (ref 148–442)

## 2021-09-18 LAB — FERRITIN: Ferritin: 229 ng/mL (ref 11–307)

## 2021-09-20 ENCOUNTER — Telehealth: Payer: Self-pay | Admitting: Hematology

## 2021-09-20 NOTE — Telephone Encounter (Signed)
Left message with follow-up appointment per 3/13 los. ?

## 2021-09-24 ENCOUNTER — Encounter: Payer: Self-pay | Admitting: Hematology

## 2021-09-24 NOTE — Progress Notes (Signed)
? ? ?HEMATOLOGY/ONCOLOGY CLINIC NOTE ? ?Date of Service:.09/18/2021 ? ? ?Patient Care Team: ?Lawerance Cruel, MD as PCP - General (Family Medicine) ?Pixie Casino, MD as PCP - Cardiology (Cardiology) ? ?CHIEF COMPLAINTS/PURPOSE OF CONSULTATION:  ?Follow-up for continued management of JAK2 positive MPN ? ?HISTORY OF PRESENTING ILLNESS:  ?Please see previous notes for details on initial presentation ? ?Interval History:  ? ?Alexis Murphy is here for continued valuation management of her JAK2 positive MPN. ?She notes no acute new symptoms since her last clinic visit. ?Continues to be on low-dose prednisone as per rheumatology for her sarcoidosis. ?No fevers no chills no night sweats. ?No new lower extremity swelling redness pain or discomfort. ?No chest pain or shortness of breath. ?Labs done today reviewed in detail with the patient. ? ? ?MEDICAL HISTORY:  ?Past Medical History:  ?Diagnosis Date  ? Arthritis   ? Hyperlipidemia   ? Sarcoidosis   ? ? ?SURGICAL HISTORY: ?Past Surgical History:  ?Procedure Laterality Date  ? ABDOMINAL HYSTERECTOMY    ? BREAST BIOPSY    ? BREAST SURGERY    ? REDUCTION MAMMAPLASTY    ? ? ?SOCIAL HISTORY: ?Social History  ? ?Socioeconomic History  ? Marital status: Widowed  ?  Spouse name: Not on file  ? Number of children: Not on file  ? Years of education: Not on file  ? Highest education level: Not on file  ?Occupational History  ? Not on file  ?Tobacco Use  ? Smoking status: Former  ?  Packs/day: 1.00  ?  Years: 45.00  ?  Pack years: 45.00  ?  Types: Cigarettes  ?  Quit date: 10/15/2007  ?  Years since quitting: 13.9  ? Smokeless tobacco: Never  ?Vaping Use  ? Vaping Use: Never used  ?Substance and Sexual Activity  ? Alcohol use: Yes  ? Drug use: No  ? Sexual activity: Not on file  ?Other Topics Concern  ? Not on file  ?Social History Narrative  ? Not on file  ? ?Social Determinants of Health  ? ?Financial Resource Strain: Not on file  ?Food Insecurity: Not on file   ?Transportation Needs: Not on file  ?Physical Activity: Not on file  ?Stress: Not on file  ?Social Connections: Not on file  ?Intimate Partner Violence: Not on file  ? ? ?FAMILY HISTORY: ?Family History  ?Problem Relation Age of Onset  ? Heart disease Father   ? Heart disease Brother   ? ? ?ALLERGIES:  is allergic to penicillins and codeine. ? ?MEDICATIONS:  ?Current Outpatient Medications  ?Medication Sig Dispense Refill  ? acetaminophen (TYLENOL) 325 MG tablet Take 1 tablet (325 mg total) by mouth every 6 (six) hours. (Patient taking differently: Take 325 mg by mouth every 6 (six) hours as needed for headache.) 30 tablet 0  ? amLODipine (NORVASC) 5 MG tablet Take 5 mg by mouth every evening.     ? aspirin EC 81 MG tablet Take 81 mg by mouth daily.    ? atorvastatin (LIPITOR) 40 MG tablet Take 40 mg by mouth every evening.     ? Cholecalciferol (VITAMIN D3) 25 MCG (1000 UT) CAPS TAKE TWO CAPSULES BY MOUTH ONCE DAILY 60 capsule 5  ? famotidine (PEPCID) 20 MG tablet One after supper 30 tablet 11  ? FERREX 150 150 MG capsule TAKE ONE CAPSULE BY MOUTH ONCE DAILY 30 capsule 2  ? folic acid (FOLVITE) 1 MG tablet TAKE ONE TABLET BY MOUTH ONCE DAILY 30 tablet 5  ?  hydroxyurea (HYDREA) 500 MG capsule Take '500mg'$  po daily 4 days a week (Mon through Thursday) . Do not take Friday, sat and Sunday. May take with food to minimize GI side effects. 30 capsule 3  ? levothyroxine (SYNTHROID) 50 MCG tablet Take 50 mcg by mouth every morning.    ? Melatonin 5 MG TABS Take 5 mg by mouth at bedtime.    ? omeprazole (PRILOSEC) 40 MG capsule Take 40 mg by mouth every evening.     ? potassium chloride SA (KLOR-CON M) 20 MEQ tablet TAKE ONE TABLET BY MOUTH ONCE DAILY 30 tablet 1  ? predniSONE (DELTASONE) 5 MG tablet Take 5 mg by mouth daily.    ? venlafaxine XR (EFFEXOR-XR) 150 MG 24 hr capsule Take 150 mg by mouth every evening.     ? ?No current facility-administered medications for this visit.  ? ? ?REVIEW OF SYSTEMS:   ?10 Point review  of Systems was done is negative except as noted above. ? ? ?PHYSICAL EXAMINATION: ?ECOG FS:2 - Symptomatic, <50% confined to bed ? ?Vitals:  ? 09/18/21 1454  ?BP: (!) 151/67  ?Pulse: 89  ?Resp: 17  ?Temp: (!) 96.1 ?F (35.6 ?C)  ?SpO2: 95%  ? ? ?Wt Readings from Last 3 Encounters:  ?09/18/21 133 lb 1 oz (60.4 kg)  ?06/19/21 138 lb 8 oz (62.8 kg)  ?03/20/21 144 lb 8 oz (65.5 kg)  ? ?Body mass index is 26.88 kg/m?.   ? ?NAD ?GENERAL:alert, in no acute distress and comfortable ?SKIN: no acute rashes, no significant lesions ?EYES: conjunctiva are pink and non-injected, sclera anicteric ?OROPHARYNX: MMM, no exudates, no oropharyngeal erythema or ulceration ?NECK: supple, no JVD ?LYMPH:  no palpable lymphadenopathy in the cervical, axillary or inguinal regions ?LUNGS: clear to auscultation b/l with normal respiratory effort ?HEART: regular rate & rhythm ?ABDOMEN:  normoactive bowel sounds , non tender, not distended. ?Extremity: no pedal edema ?PSYCH: alert & oriented x 3 with fluent speech ?NEURO: no focal motor/sensory deficits ? ? ?LABORATORY DATA:  ?I have reviewed the data as listed ? ?CBC Latest Ref Rng & Units 09/18/2021 06/19/2021 03/20/2021  ?WBC 4.0 - 10.5 K/uL 9.9 9.5 10.4  ?Hemoglobin 12.0 - 15.0 g/dL 11.0(L) 11.5(L) 11.9(L)  ?Hematocrit 36.0 - 46.0 % 34.1(L) 35.6(L) 36.5  ?Platelets 150 - 400 K/uL 457(H) 497(H) 505(H)  ? ? ?. ?CMP Latest Ref Rng & Units 09/18/2021 06/19/2021 03/20/2021  ?Glucose 70 - 99 mg/dL 90 110(H) 91  ?BUN 8 - 23 mg/dL '20 21 22  '$ ?Creatinine 0.44 - 1.00 mg/dL 1.49(H) 1.77(H) 1.94(H)  ?Sodium 135 - 145 mmol/L 138 138 139  ?Potassium 3.5 - 5.1 mmol/L 3.9 4.0 4.5  ?Chloride 98 - 111 mmol/L 105 106 106  ?CO2 22 - 32 mmol/L 24 21(L) 23  ?Calcium 8.9 - 10.3 mg/dL 8.9 8.7(L) 9.4  ?Total Protein 6.5 - 8.1 g/dL 6.6 6.8 7.0  ?Total Bilirubin 0.3 - 1.2 mg/dL 0.3 0.5 0.4  ?Alkaline Phos 38 - 126 U/L 127(H) 111 113  ?AST 15 - 41 U/L 14(L) 14(L) 14(L)  ?ALT 0 - 44 U/L '10 10 10  '$ ? ?. ?Lab Results  ?Component  Value Date  ? IRON 81 09/18/2021  ? TIBC 283 09/18/2021  ? IRONPCTSAT 29 09/18/2021  ? ?(Iron and TIBC) ? ?Lab Results  ?Component Value Date  ? FERRITIN 229 09/18/2021  ? ? ? ? ? ? ? ? ? ?RADIOGRAPHIC STUDIES: ?I have personally reviewed the radiological images as listed and agreed with the  findings in the report. ?No results found. ? ?ASSESSMENT & PLAN:  ? ?76 y.o. female with ? ?1.  Multisystem sarcoidosis -follows with rheumatology Dr. Amil Amen ?Presented with Generalized lymphadenopathy with multiple pulmonary nodules ?CT Chest 09/19/2017: Moderate mediastinal and right supraclavicular lymphadenopathy, increased. New mild bilateral axillary and bilateral hilar lymphadenopathy. Findings are worrisome for a malignant process such as a lymphoproliferative disorder or metastatic nodal disease. The right supraclavicular node may be (but not definitely) accessible for percutaneous biopsy. 2. Numerous similar-appearing subsolid spiculated pulmonary nodules scattered throughout both lungs, upper lobe predominant, largest 1.6 cm, stable to mildly increased in size and slightly increased in density in the interval. These are indeterminate, with multifocal lung adenocarcinoma on the differential. ? ?12/26/17 CT C/A/P revealed interval response to steroids in LNadenopathy  ? ?07/17/18 CT C/A/P revealed Continue improvement in mediastinal adenopathy. Lymph nodes are now not pathologic by size criteria. 2. Continued improvement in nodular and ground-glass densities particularly in the RIGHT upper lobe. 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 ? ?2. Breast - lesion - Bx -- granulomatous inflammation -consistent with sarcoidosis. ? ?3. Skin rash on nose - Bx consistent with sarcoidosis. ?PLAN ?-Continue follow-up with Dr. Amil Amen. ?-Patient is currently on prednisone 5 mg p.o. daily and leflunomide. ? ?4. H/o  Iron deficiency - no significant anemia ?Marland Kitchen ?Lab Results  ?Component Value Date  ? IRON 81 09/18/2021  ?  TIBC 283 09/18/2021  ? IRONPCTSAT 29 09/18/2021  ? ?(Iron and TIBC) ? ?Lab Results  ?Component Value Date  ? FERRITIN 229 09/18/2021  ?PLAN ?-Iron levels within normal limits ?-No indication for IV iron replacement

## 2021-09-27 ENCOUNTER — Other Ambulatory Visit: Payer: Self-pay | Admitting: Hematology

## 2021-09-27 DIAGNOSIS — E876 Hypokalemia: Secondary | ICD-10-CM

## 2021-09-27 DIAGNOSIS — D473 Essential (hemorrhagic) thrombocythemia: Secondary | ICD-10-CM

## 2021-09-30 ENCOUNTER — Other Ambulatory Visit: Payer: Self-pay

## 2021-09-30 DIAGNOSIS — I739 Peripheral vascular disease, unspecified: Secondary | ICD-10-CM

## 2021-10-09 ENCOUNTER — Ambulatory Visit: Payer: Medicare HMO | Admitting: Surgery

## 2021-10-09 ENCOUNTER — Encounter: Payer: Self-pay | Admitting: Surgery

## 2021-10-09 ENCOUNTER — Ambulatory Visit (HOSPITAL_COMMUNITY)
Admission: RE | Admit: 2021-10-09 | Discharge: 2021-10-09 | Disposition: A | Discharge status: Home / Self Care | Payer: Medicare HMO | Source: Ambulatory Visit | Attending: Surgery | Admitting: Surgery

## 2021-10-09 VITALS — BP 132/79 | HR 73 | Temp 98.0°F | Resp 20 | Ht 59.0 in | Wt 132.0 lb

## 2021-10-09 DIAGNOSIS — I739 Peripheral vascular disease, unspecified: Secondary | ICD-10-CM | POA: Diagnosis not present

## 2021-10-09 NOTE — Progress Notes (Signed)
? ?[facility] [place] ? ?Patient name: Alexis Murphy MRN: [id number] DOB: 07-11-1945 Sex: female ? ? ?REASON FOR VISIT:  ? ? ?Follow up ? ?HISOTRY OF PRESENT ILLNESS:  ? ?Alexis Murphy is a 76 y.o. female who I initially saw on 11/05/2016 for evaluation of leg pain which has been going on for approximately 4 years.  It initially presented with a problem in her left fifth toe which was felt to be an ingrown toenail.  This was removed but her symptoms did not improve.  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  She states that she will occasionally get severe pain and bluish discoloration of her toes.  She has taken Neurontin which helped.  She tries to avoid cold temperatures and she does better in the summertime.  She has tried trying Tylenol, but this did not give her any relief.  She has taken a calcium channel blocker for the possibility of Raynauds.  She does have a history of autoimmune disease in her family, with her mother having been diagnosed with lupus.  She does not have any claudication symptoms.  She has been diagnosed with JAK2 Gene Mutation.  She Continues on Low-Dose Steroids. ?  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  Because of the diffuse nature of the process I did not think she was a surgical candidate.  I recommended treating it medically.  She states that she bruises too easily with blood thinners.  I started her on an 81 mg aspirin.   ?  ?In the interval since I last seen her, she was diagnosed with sarcoidosis.  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  She has stopped taking the aspirin we had recommended but has now restarted it.  She has not had any other  neurologic episodes ? ? ?PAST MEDICAL HISTORY:  ? ?Past Medical History:  ?Diagnosis Date  ? Arthritis   ? Hyperlipidemia   ? Sarcoidosis   ? ? ? ?FAMILY HISTORY:  ? ?Family History  ?Problem Relation Age of Onset  ? Heart disease Father   ? Heart disease Brother   ? ? ?SOCIAL HISTORY:  ? ?Social History  ? ?Tobacco Use  ? Smoking status: Former  ?  Packs/day: 1.00  ?  Years: 45.00  ?  Pack years: 45.00  ?  Types: Cigarettes  ?  Quit date: 10/15/2007  ?  Years since quitting: 13.9  ? Smokeless tobacco: Never  ?Substance Use Topics  ? Alcohol use: Yes  ? ? ? ?ALLERGIES:  ? ?Allergies  ?Allergen Reactions  ? Penicillins Anaphylaxis and Rash  ?  Did it involve swelling of the face/tongue/throat, SOB, or low BP? Yes  ?Did it involve sudden or severe rash/hives, skin peeling, or any reaction on the inside of your mouth or nose? No ?Did you need to seek medical attention at a hospital or doctor's office? No ?When did it last happen? Childhood ?If all above answers are ?NO?, may proceed with cephalosporin use. ?  ? Codeine Other (See Comments)  ?  Vomiting ?  ? Pentazocine   ?  Other reaction(s): itch  ? ? ? ?CURRENT MEDICATIONS:  ? ?Current Outpatient Medications  ?Medication Sig Dispense Refill  ?  acetaminophen (TYLENOL) 325 MG tablet Take 1 tablet (325 mg total) by mouth every 6 (six) hours. (Patient taking differently: Take 325 mg by mouth every 6 (six) hours as needed for headache.) 30 tablet 0  ? amLODipine (NORVASC) 5 MG tablet Take 5 mg by mouth every evening.     ? aspirin EC 81 MG tablet Take 81 mg by mouth daily.    ? atorvastatin (LIPITOR) 40 MG tablet Take 40 mg by mouth every evening.     ? Cholecalciferol (VITAMIN D3) 25 MCG (1000 UT) CAPS TAKE TWO CAPSULES BY MOUTH ONCE DAILY 60 capsule 5  ? famotidine (PEPCID) 20 MG tablet One after supper 30 tablet 11  ? FERREX 150 150 MG capsule TAKE ONE CAPSULE BY MOUTH ONCE DAILY 30 capsule 2  ? folic acid (FOLVITE) 1 MG tablet TAKE ONE TABLET BY MOUTH ONCE DAILY 30  tablet 5  ? hydroxyurea (HYDREA) 500 MG capsule Take '500mg'$  po daily 4 days a week (Mon through Thursday) . Do not take Friday, sat and Sunday. May take with food to minimize GI side effects. 30 capsule 3  ? levothyroxine (SYNTHROID) 50 MCG tablet Take 50 mcg by mouth every morning.    ? Melatonin 5 MG TABS Take 5 mg by mouth at bedtime.    ? omeprazole (PRILOSEC) 40 MG capsule Take 40 mg by mouth every evening.     ? potassium chloride SA (KLOR-CON M) 20 MEQ tablet TAKE ONE TABLET BY MOUTH ONCE daily 30 tablet 1  ? predniSONE (DELTASONE) 5 MG tablet Take 5 mg by mouth daily.    ? venlafaxine XR (EFFEXOR-XR) 150 MG 24 hr capsule Take 150 mg by mouth every evening.     ? ?No current facility-administered medications for this visit.  ? ? ?REVIEW OF SYSTEMS:  ? ?'[X]'$  denotes positive finding, '[ ]'$  denotes negative finding ?Cardiac  Comments:  ?Chest pain or chest pressure:    ?Shortness of breath upon exertion:    ?Short of breath when lying flat:    ?Irregular heart rhythm:    ?    ?Vascular    ?Pain in calf, thigh, or hip brought on by ambulation:    ?Pain in feet at night that wakes you up from your sleep:     ?Blood clot in your veins:    ?Leg swelling:     ?    ?Pulmonary    ?Oxygen at home:    ?Productive cough:     ?Wheezing:     ?    ?Neurologic    ?Sudden weakness in arms or legs:     ?Sudden numbness in arms or legs:     ?Sudden onset of difficulty speaking or slurred speech:    ?Temporary loss of vision in one eye:     ?Problems with dizziness:     ?    ?Gastrointestinal    ?Blood in stool:     ?Vomited blood:     ?    ?Genitourinary    ?Burning when urinating:     ?Blood in urine:    ?    ?Psychiatric    ?Major depression:     ?    ?Hematologic    ?Bleeding problems:    ?Problems with blood clotting too easily:    ?    ?Skin    ?Rashes or ulcers:    ?    ?Constitutional    ?Fever or chills:    ? ? ?PHYSICAL EXAM:  ? ?  Vitals:  ? 10/09/21 1533  ?BP: 132/79  ?Pulse: 73  ?Resp: 20  ?Temp: 98 ?F (36.7 ?C)  ?SpO2:  93%  ?Weight: 132 lb (59.9 kg)  ?Height: '4\' 11"'$  (1.499 m)  ? ? ?GENERAL: The patient is a well-nourished female, in no acute distress. The vital signs are documented above. ?CARDIAC: There is a regular rate and rhythm.  ?VASCULAR: Palpable pedal and radial pulses bilaterally ?PULMONARY: Non-labored respirations ?ABDOMEN: Soft and non-tender with normal pitched bowel sounds.  ?MUSCULOSKELETAL: There are no major deformities or cyanosis. ?NEUROLOGIC: No focal weakness or paresthesias are detected. ?SKIN: There are no ulcers or rashes noted. ?PSYCHIATRIC: The patient has a normal affect. ? ?STUDIES:  ? ?I have reviewed the following vascular lab studies: ?+-------+-----------+-----------+------------+------------+  ?ABI/TBIToday's ABIToday's TBIPrevious ABIPrevious TBI  ?+-------+-----------+-----------+------------+------------+  ?Right  1.11       0          1.04        0             ?+-------+-----------+-----------+------------+------------+  ?Left   [place]         0.62       1.1         0.54          ?+-------+-----------+-----------+------------+------------+ ?Right toe pressure equals 0 ?Left toe pressure equals 89 ? ? ?MEDICAL ISSUES:  ? ?PAD: The patient does not have any symptoms currently.  She was initially started on aspirin for mural thrombus in the setting of a possible thromboembolic event.  She has had no sequelae from this.  She has had no further events.  I told her she should continue her aspirin.  I will have her follow-up with repeat studies in 2 years. ? ? ? ?Annamarie Major, IV, MD, FACS ?[facility] ?Tel [phone] ?Pager [phone]  ?

## 2021-10-31 DIAGNOSIS — N1832 Chronic kidney disease, stage 3b: Secondary | ICD-10-CM | POA: Diagnosis not present

## 2021-10-31 DIAGNOSIS — Z1589 Genetic susceptibility to other disease: Secondary | ICD-10-CM | POA: Diagnosis not present

## 2021-10-31 DIAGNOSIS — D473 Essential (hemorrhagic) thrombocythemia: Secondary | ICD-10-CM | POA: Diagnosis not present

## 2021-10-31 DIAGNOSIS — L52 Erythema nodosum: Secondary | ICD-10-CM | POA: Diagnosis not present

## 2021-10-31 DIAGNOSIS — H348112 Central retinal vein occlusion, right eye, stable: Secondary | ICD-10-CM | POA: Diagnosis not present

## 2021-10-31 DIAGNOSIS — M170 Bilateral primary osteoarthritis of knee: Secondary | ICD-10-CM | POA: Diagnosis not present

## 2021-10-31 DIAGNOSIS — I7300 Raynaud's syndrome without gangrene: Secondary | ICD-10-CM | POA: Diagnosis not present

## 2021-10-31 DIAGNOSIS — M8580 Other specified disorders of bone density and structure, unspecified site: Secondary | ICD-10-CM | POA: Diagnosis not present

## 2021-10-31 DIAGNOSIS — Z7952 Long term (current) use of systemic steroids: Secondary | ICD-10-CM | POA: Diagnosis not present

## 2021-10-31 DIAGNOSIS — D869 Sarcoidosis, unspecified: Secondary | ICD-10-CM | POA: Diagnosis not present

## 2021-11-01 DIAGNOSIS — I10 Essential (primary) hypertension: Secondary | ICD-10-CM | POA: Diagnosis not present

## 2021-11-01 DIAGNOSIS — M1990 Unspecified osteoarthritis, unspecified site: Secondary | ICD-10-CM | POA: Diagnosis not present

## 2021-11-01 DIAGNOSIS — R69 Illness, unspecified: Secondary | ICD-10-CM | POA: Diagnosis not present

## 2021-11-01 DIAGNOSIS — E7800 Pure hypercholesterolemia, unspecified: Secondary | ICD-10-CM | POA: Diagnosis not present

## 2021-11-01 DIAGNOSIS — N1832 Chronic kidney disease, stage 3b: Secondary | ICD-10-CM | POA: Diagnosis not present

## 2021-11-01 DIAGNOSIS — E039 Hypothyroidism, unspecified: Secondary | ICD-10-CM | POA: Diagnosis not present

## 2021-11-01 DIAGNOSIS — K219 Gastro-esophageal reflux disease without esophagitis: Secondary | ICD-10-CM | POA: Diagnosis not present

## 2021-11-01 DIAGNOSIS — J449 Chronic obstructive pulmonary disease, unspecified: Secondary | ICD-10-CM | POA: Diagnosis not present

## 2021-11-08 DIAGNOSIS — J449 Chronic obstructive pulmonary disease, unspecified: Secondary | ICD-10-CM | POA: Diagnosis not present

## 2021-11-13 ENCOUNTER — Other Ambulatory Visit: Payer: Self-pay | Admitting: Hematology

## 2021-11-13 DIAGNOSIS — D473 Essential (hemorrhagic) thrombocythemia: Secondary | ICD-10-CM

## 2021-11-15 ENCOUNTER — Other Ambulatory Visit: Payer: Self-pay | Admitting: Hematology

## 2021-11-27 ENCOUNTER — Other Ambulatory Visit: Payer: Self-pay | Admitting: Hematology

## 2021-12-19 ENCOUNTER — Other Ambulatory Visit: Payer: Medicare HMO

## 2021-12-19 ENCOUNTER — Ambulatory Visit: Payer: Medicare HMO | Admitting: Hematology

## 2021-12-22 ENCOUNTER — Other Ambulatory Visit: Payer: Self-pay | Admitting: *Deleted

## 2021-12-22 DIAGNOSIS — I10 Essential (primary) hypertension: Secondary | ICD-10-CM | POA: Diagnosis not present

## 2021-12-22 DIAGNOSIS — E039 Hypothyroidism, unspecified: Secondary | ICD-10-CM | POA: Diagnosis not present

## 2021-12-22 DIAGNOSIS — D473 Essential (hemorrhagic) thrombocythemia: Secondary | ICD-10-CM | POA: Diagnosis not present

## 2021-12-22 DIAGNOSIS — E7800 Pure hypercholesterolemia, unspecified: Secondary | ICD-10-CM | POA: Diagnosis not present

## 2021-12-22 DIAGNOSIS — D509 Iron deficiency anemia, unspecified: Secondary | ICD-10-CM

## 2021-12-25 ENCOUNTER — Inpatient Hospital Stay: Payer: Medicare HMO | Admitting: Hematology

## 2021-12-25 ENCOUNTER — Inpatient Hospital Stay: Payer: Medicare HMO

## 2021-12-29 DIAGNOSIS — Z0000 Encounter for general adult medical examination without abnormal findings: Secondary | ICD-10-CM | POA: Diagnosis not present

## 2022-01-15 DIAGNOSIS — D649 Anemia, unspecified: Secondary | ICD-10-CM | POA: Diagnosis not present

## 2022-01-16 DIAGNOSIS — I10 Essential (primary) hypertension: Secondary | ICD-10-CM | POA: Diagnosis not present

## 2022-02-02 ENCOUNTER — Other Ambulatory Visit: Payer: Self-pay

## 2022-02-02 ENCOUNTER — Inpatient Hospital Stay: Payer: Medicare HMO | Attending: Hematology

## 2022-02-02 ENCOUNTER — Inpatient Hospital Stay: Payer: Medicare HMO | Admitting: Hematology

## 2022-02-02 VITALS — BP 138/61 | HR 95 | Temp 97.5°F | Resp 17 | Ht 59.0 in | Wt 124.2 lb

## 2022-02-02 DIAGNOSIS — D473 Essential (hemorrhagic) thrombocythemia: Secondary | ICD-10-CM | POA: Diagnosis not present

## 2022-02-02 DIAGNOSIS — Z7982 Long term (current) use of aspirin: Secondary | ICD-10-CM | POA: Diagnosis not present

## 2022-02-02 DIAGNOSIS — D509 Iron deficiency anemia, unspecified: Secondary | ICD-10-CM

## 2022-02-02 DIAGNOSIS — N189 Chronic kidney disease, unspecified: Secondary | ICD-10-CM | POA: Diagnosis not present

## 2022-02-02 DIAGNOSIS — Z1589 Genetic susceptibility to other disease: Secondary | ICD-10-CM | POA: Diagnosis not present

## 2022-02-02 DIAGNOSIS — Z87891 Personal history of nicotine dependence: Secondary | ICD-10-CM | POA: Diagnosis not present

## 2022-02-02 DIAGNOSIS — D869 Sarcoidosis, unspecified: Secondary | ICD-10-CM | POA: Insufficient documentation

## 2022-02-02 DIAGNOSIS — D471 Chronic myeloproliferative disease: Secondary | ICD-10-CM | POA: Insufficient documentation

## 2022-02-02 DIAGNOSIS — Z79899 Other long term (current) drug therapy: Secondary | ICD-10-CM | POA: Diagnosis not present

## 2022-02-02 DIAGNOSIS — Z862 Personal history of diseases of the blood and blood-forming organs and certain disorders involving the immune mechanism: Secondary | ICD-10-CM | POA: Diagnosis not present

## 2022-02-02 LAB — CBC WITH DIFFERENTIAL (CANCER CENTER ONLY)
Abs Immature Granulocytes: 0.05 10*3/uL (ref 0.00–0.07)
Basophils Absolute: 0.1 10*3/uL (ref 0.0–0.1)
Basophils Relative: 1 %
Eosinophils Absolute: 0.1 10*3/uL (ref 0.0–0.5)
Eosinophils Relative: 2 %
HCT: 33.4 % — ABNORMAL LOW (ref 36.0–46.0)
Hemoglobin: 10.9 g/dL — ABNORMAL LOW (ref 12.0–15.0)
Immature Granulocytes: 1 %
Lymphocytes Relative: 15 %
Lymphs Abs: 1.2 10*3/uL (ref 0.7–4.0)
MCH: 32.6 pg (ref 26.0–34.0)
MCHC: 32.6 g/dL (ref 30.0–36.0)
MCV: 100 fL (ref 80.0–100.0)
Monocytes Absolute: 0.6 10*3/uL (ref 0.1–1.0)
Monocytes Relative: 8 %
Neutro Abs: 5.7 10*3/uL (ref 1.7–7.7)
Neutrophils Relative %: 73 %
Platelet Count: 339 10*3/uL (ref 150–400)
RBC: 3.34 MIL/uL — ABNORMAL LOW (ref 3.87–5.11)
RDW: 14.9 % (ref 11.5–15.5)
WBC Count: 7.8 10*3/uL (ref 4.0–10.5)
nRBC: 0 % (ref 0.0–0.2)

## 2022-02-02 LAB — CMP (CANCER CENTER ONLY)
ALT: 9 U/L (ref 0–44)
AST: 16 U/L (ref 15–41)
Albumin: 4.5 g/dL (ref 3.5–5.0)
Alkaline Phosphatase: 106 U/L (ref 38–126)
Anion gap: 7 (ref 5–15)
BUN: 25 mg/dL — ABNORMAL HIGH (ref 8–23)
CO2: 27 mmol/L (ref 22–32)
Calcium: 8.9 mg/dL (ref 8.9–10.3)
Chloride: 104 mmol/L (ref 98–111)
Creatinine: 1.75 mg/dL — ABNORMAL HIGH (ref 0.44–1.00)
GFR, Estimated: 30 mL/min — ABNORMAL LOW (ref >60)
Glucose, Bld: 101 mg/dL — ABNORMAL HIGH (ref 70–99)
Potassium: 3.9 mmol/L (ref 3.5–5.1)
Sodium: 138 mmol/L (ref 135–145)
Total Bilirubin: 0.5 mg/dL (ref 0.3–1.2)
Total Protein: 7.1 g/dL (ref 6.5–8.1)

## 2022-02-02 LAB — IRON AND IRON BINDING CAPACITY (CC-WL,HP ONLY)
Iron: 77 ug/dL (ref 28–170)
Saturation Ratios: 28 % (ref 10.4–31.8)
TIBC: 279 ug/dL (ref 250–450)
UIBC: 202 ug/dL (ref 148–442)

## 2022-02-02 LAB — FERRITIN: Ferritin: 162 ng/mL (ref 11–307)

## 2022-02-02 NOTE — Progress Notes (Signed)
HEMATOLOGY/ONCOLOGY CLINIC NOTE  Date of Service: 02/02/2022   Patient Care Team: Lawerance Cruel, MD as PCP - General (Family Medicine) [name], MD as PCP - Cardiology (Cardiology)  CHIEF COMPLAINTS/PURPOSE OF CONSULTATION:  Follow-up for continued management of JAK2 positive MPN  HISTORY OF PRESENTING ILLNESS:  Please see previous notes for details on initial presentation  Interval History:  Alexis Murphy is here for continued valuation management of her JAK2 positive MPN. She reports She is doing well with no new symptoms or concerns.  Continues to be on low-dose prednisone as per rheumatology for her sarcoidosis.  No fevers no chills no night sweats. No new lower extremity swelling redness pain or discomfort. No chest pain or shortness of breath. No other new or acute focal symptoms.  Labs done today reviewed in detail with the patient.  MEDICAL HISTORY:  Past Medical History:  Diagnosis Date   Arthritis    Hyperlipidemia    Sarcoidosis     SURGICAL HISTORY: Past Surgical History:  Procedure Laterality Date   ABDOMINAL HYSTERECTOMY     BREAST BIOPSY     BREAST SURGERY     REDUCTION MAMMAPLASTY      SOCIAL HISTORY: Social History   Socioeconomic History   Marital status: Widowed    Spouse name: Not on file   Number of children: Not on file   Years of education: Not on file   Highest education level: Not on file  Occupational History   Not on file  Tobacco Use   Smoking status: Former    Packs/day: 1.00    Years: 45.00    Total pack years: 45.00    Types: Cigarettes    Quit date: 10/15/2007    Years since quitting: 14.3   Smokeless tobacco: Never  Vaping Use   Vaping Use: Never used  Substance and Sexual Activity   Alcohol use: Yes   Drug use: No   Sexual activity: Not on file  Other Topics Concern   Not on file  Social History Narrative   Not on file   Social Determinants of Health   Financial Resource Strain: Not on  file  Food Insecurity: Not on file  Transportation Needs: Not on file  Physical Activity: Not on file  Stress: Not on file  Social Connections: Not on file  Intimate Partner Violence: Not on file    FAMILY HISTORY: Family History  Problem Relation Age of Onset   Heart disease Father    Heart disease Brother     ALLERGIES:  is allergic to penicillins, codeine, and pentazocine.  MEDICATIONS:  Current Outpatient Medications  Medication Sig Dispense Refill   acetaminophen (TYLENOL) 325 MG tablet Take 1 tablet (325 mg total) by mouth every 6 (six) hours. (Patient taking differently: Take 325 mg by mouth every 6 (six) hours as needed for headache.) 30 tablet 0   amLODipine (NORVASC) 5 MG tablet Take 5 mg by mouth every evening.      aspirin EC 81 MG tablet Take 81 mg by mouth daily.     atorvastatin (LIPITOR) 40 MG tablet Take 40 mg by mouth every evening.      Cholecalciferol (VITAMIN D3) 25 MCG (1000 UT) CAPS TAKE TWO CAPSULES BY MOUTH ONCE DAILY 60 capsule 5   famotidine (PEPCID) 20 MG tablet One after supper 30 tablet 11   FERREX 150 150 MG capsule TAKE ONE CAPSULE BY MOUTH ONCE DAILY 30 capsule 2   folic acid (FOLVITE) 1  MG tablet TAKE ONE TABLET BY MOUTH ONCE DAILY 30 tablet 5   hydroxyurea (HYDREA) 500 MG capsule TAKE ONE CAPSULE ON DAYS MONDAY-THURSDAY. DO not take friday, saturday, OR sunday. 30 capsule 3   levothyroxine (SYNTHROID) 50 MCG tablet Take 50 mcg by mouth every morning.     Melatonin 5 MG TABS Take 5 mg by mouth at bedtime.     omeprazole (PRILOSEC) 40 MG capsule Take 40 mg by mouth every evening.      potassium chloride SA (KLOR-CON M) 20 MEQ tablet TAKE ONE TABLET BY MOUTH ONCE daily 30 tablet 1   predniSONE (DELTASONE) 5 MG tablet Take 5 mg by mouth daily.     venlafaxine XR (EFFEXOR-XR) 150 MG 24 hr capsule Take 150 mg by mouth every evening.      No current facility-administered medications for this visit.    REVIEW OF SYSTEMS:   10 Point review of Systems  was done is negative except as noted above.   PHYSICAL EXAMINATION: ECOG FS:2 - Symptomatic, <50% confined to bed  Vitals:   02/02/22 1025  BP: 138/61  Pulse: 95  Resp: 17  Temp: (!) 97.5 F (36.4 C)  SpO2: 93%    Wt Readings from Last 3 Encounters:  02/02/22 124 lb 3.2 oz (56.3 kg)  10/09/21 132 lb (59.9 kg)  09/18/21 133 lb 1 oz (60.4 kg)   Body mass index is 25.09 kg/m.    NAD GENERAL:alert, in no acute distress and comfortable SKIN: no acute rashes, no significant lesions EYES: conjunctiva are pink and non-injected, sclera anicteric NECK: supple, no JVD LYMPH:  no palpable lymphadenopathy in the cervical, axillary or inguinal regions LUNGS: Mild scattered rhonchi. HEART: regular rate & rhythm ABDOMEN:  normoactive bowel sounds , non tender, not distended. Extremity: no pedal edema PSYCH: alert & oriented x 3 with fluent speech NEURO: no focal motor/sensory deficits  LABORATORY DATA:  I have reviewed the data as listed     Latest Ref Rng & Units 02/02/2022    9:46 AM 09/18/2021    2:09 PM 06/19/2021    1:47 PM  CBC  WBC 4.0 - 10.5 K/uL 7.8  9.9  9.5   Hemoglobin 12.0 - 15.0 g/dL 10.9  11.0  11.5   Hematocrit 36.0 - 46.0 % 33.4  34.1  35.6   Platelets 150 - 400 K/uL 339  457  497     .    Latest Ref Rng & Units 02/02/2022    9:46 AM 09/18/2021    2:09 PM 06/19/2021    1:47 PM  CMP  Glucose 70 - 99 mg/dL 101  90  110   BUN 8 - 23 mg/dL '25  20  21   '$ Creatinine 0.44 - 1.00 mg/dL 1.75  1.49  1.77   Sodium 135 - 145 mmol/L 138  138  138   Potassium 3.5 - 5.1 mmol/L 3.9  3.9  4.0   Chloride 98 - 111 mmol/L 104  105  106   CO2 22 - 32 mmol/L '27  24  21   '$ Calcium 8.9 - 10.3 mg/dL 8.9  8.9  8.7   Total Protein 6.5 - 8.1 g/dL 7.1  6.6  6.8   Total Bilirubin 0.3 - 1.2 mg/dL 0.5  0.3  0.5   Alkaline Phos 38 - 126 U/L 106  127  111   AST 15 - 41 U/L '16  14  14   '$ ALT 0 - 44 U/L 9  10  10    . Lab Results  Component Value Date   IRON 81 09/18/2021   TIBC  283 09/18/2021   IRONPCTSAT 29 09/18/2021   (Iron and TIBC)  Lab Results  Component Value Date   FERRITIN 229 09/18/2021           RADIOGRAPHIC STUDIES: I have personally reviewed the radiological images as listed and agreed with the findings in the report. No results found.  ASSESSMENT & PLAN:   76 y.o. female with  1.  Multisystem sarcoidosis -follows with rheumatology Dr. Amil Amen Presented with Generalized lymphadenopathy with multiple pulmonary nodules CT Chest 09/19/2017: Moderate mediastinal and right supraclavicular lymphadenopathy, increased. New mild bilateral axillary and bilateral hilar lymphadenopathy. Findings are worrisome for a malignant process such as a lymphoproliferative disorder or metastatic nodal disease. The right supraclavicular node may be (but not definitely) accessible for percutaneous biopsy. 2. Numerous similar-appearing subsolid spiculated pulmonary nodules scattered throughout both lungs, upper lobe predominant, largest 1.6 cm, stable to mildly increased in size and slightly increased in density in the interval. These are indeterminate, with multifocal lung adenocarcinoma on the differential.  12/26/17 CT C/A/P revealed interval response to steroids in LNadenopathy   07/17/18 CT C/A/P revealed Continue improvement in mediastinal adenopathy. Lymph nodes are now not pathologic by size criteria. 2. Continued improvement in nodular and ground-glass densities particularly in the RIGHT upper lobe. 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  2. Breast - lesion - Bx -- granulomatous inflammation -consistent with sarcoidosis.  3. Skin rash on nose - Bx consistent with sarcoidosis. PLAN -Continue follow-up with Dr. Amil Amen. -Patient is currently on prednisone 5 mg p.o. daily and leflunomide.  4. H/o  Iron deficiency - no significant anemia . Lab Results  Component Value Date   IRON 81 09/18/2021   TIBC 283 09/18/2021   IRONPCTSAT 29  09/18/2021   (Iron and TIBC)  Lab Results  Component Value Date   FERRITIN 229 09/18/2021   PLAN -Iron levels within normal limits -No indication for IV iron replacement at this time. -Currently on oral Ferrex and can continue this until her iron saturation at about 30% and ferritin about 250 given her chronic kidney disease.  5.  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 CBC with a hemoglobin of 10.9, WBC count of 7.8k and platelets of 339k down from 457k CMP stable with chronic kidney disease creatinine 1.75. Her renal function is somewhat better but is still fluctuating.  Also due to inflammation from sarcoidosis that is a reactive component to the thrombocytosis.  For these reasons we will continue conservative dosing on the hydroxyurea. Patient has no prerogative toxicities from her current dose of hydroxyurea. We will continue hydroxyurea 500 mg p.o. 4 times a week No symptoms suggestive of abnormal bleeding or bruising.  No symptoms suggestive of VTE. -Continue aspirin 81 mg p.o. daily -Continue vitamin B complex and folic acid daily -Continue daily baby aspirin -Continue B complex and folic acid  FOLLOW UP: RTC with Dr Irene Limbo with labs in 3 months  The total time spent in the appointment was ***  minutes*.  All of the patient's questions were answered with apparent satisfaction. The patient knows to call the clinic with any problems, questions or concerns.   Sullivan Lone MD MS AAHIVMS [facility] Hematology/Oncology Physician [facility]  .*Total Encounter Time as defined by the Centers for Medicare and Medicaid Services includes, in addition to the face-to-face time of a patient visit (documented in the note above) non-face-to-face time: obtaining and reviewing outside history, ordering and reviewing medications, tests or procedures, care coordination (communications with other health care professionals or caregivers) and documentation in the medical record.  I, Melene Muller, am acting as scribe for Dr. Sullivan Lone, MD.

## 2022-02-07 ENCOUNTER — Encounter: Payer: Self-pay | Admitting: Hematology

## 2022-02-07 ENCOUNTER — Telehealth: Payer: Self-pay | Admitting: Hematology

## 2022-02-07 NOTE — Telephone Encounter (Signed)
Scheduled follow-up appointment per 7/28 los. Patient is aware. 

## 2022-02-19 ENCOUNTER — Other Ambulatory Visit: Payer: Self-pay | Admitting: Hematology

## 2022-02-27 ENCOUNTER — Other Ambulatory Visit: Payer: Self-pay | Admitting: Hematology

## 2022-02-27 ENCOUNTER — Other Ambulatory Visit: Payer: Self-pay | Admitting: *Deleted

## 2022-02-27 DIAGNOSIS — Z122 Encounter for screening for malignant neoplasm of respiratory organs: Secondary | ICD-10-CM

## 2022-02-27 DIAGNOSIS — Z87891 Personal history of nicotine dependence: Secondary | ICD-10-CM

## 2022-02-27 DIAGNOSIS — M25552 Pain in left hip: Secondary | ICD-10-CM | POA: Diagnosis not present

## 2022-03-01 DIAGNOSIS — D869 Sarcoidosis, unspecified: Secondary | ICD-10-CM | POA: Diagnosis not present

## 2022-03-01 DIAGNOSIS — M170 Bilateral primary osteoarthritis of knee: Secondary | ICD-10-CM | POA: Diagnosis not present

## 2022-03-01 DIAGNOSIS — Z6823 Body mass index (BMI) 23.0-23.9, adult: Secondary | ICD-10-CM | POA: Diagnosis not present

## 2022-03-01 DIAGNOSIS — M25552 Pain in left hip: Secondary | ICD-10-CM | POA: Diagnosis not present

## 2022-03-01 DIAGNOSIS — I7300 Raynaud's syndrome without gangrene: Secondary | ICD-10-CM | POA: Diagnosis not present

## 2022-03-01 DIAGNOSIS — D473 Essential (hemorrhagic) thrombocythemia: Secondary | ICD-10-CM | POA: Diagnosis not present

## 2022-03-01 DIAGNOSIS — Z1589 Genetic susceptibility to other disease: Secondary | ICD-10-CM | POA: Diagnosis not present

## 2022-03-01 DIAGNOSIS — Z7952 Long term (current) use of systemic steroids: Secondary | ICD-10-CM | POA: Diagnosis not present

## 2022-03-01 DIAGNOSIS — H348112 Central retinal vein occlusion, right eye, stable: Secondary | ICD-10-CM | POA: Diagnosis not present

## 2022-03-01 DIAGNOSIS — M8580 Other specified disorders of bone density and structure, unspecified site: Secondary | ICD-10-CM | POA: Diagnosis not present

## 2022-03-01 DIAGNOSIS — L52 Erythema nodosum: Secondary | ICD-10-CM | POA: Diagnosis not present

## 2022-03-01 DIAGNOSIS — N1832 Chronic kidney disease, stage 3b: Secondary | ICD-10-CM | POA: Diagnosis not present

## 2022-03-06 ENCOUNTER — Other Ambulatory Visit: Payer: Self-pay | Admitting: Family Medicine

## 2022-03-06 DIAGNOSIS — Z1231 Encounter for screening mammogram for malignant neoplasm of breast: Secondary | ICD-10-CM

## 2022-03-08 ENCOUNTER — Other Ambulatory Visit: Payer: Self-pay | Admitting: Sports Medicine

## 2022-03-08 DIAGNOSIS — M25552 Pain in left hip: Secondary | ICD-10-CM

## 2022-03-20 ENCOUNTER — Encounter: Payer: Self-pay | Admitting: Acute Care

## 2022-03-20 ENCOUNTER — Ambulatory Visit (INDEPENDENT_AMBULATORY_CARE_PROVIDER_SITE_OTHER): Payer: Medicare HMO | Admitting: Acute Care

## 2022-03-20 DIAGNOSIS — Z87891 Personal history of nicotine dependence: Secondary | ICD-10-CM | POA: Diagnosis not present

## 2022-03-20 NOTE — Patient Instructions (Signed)
Thank you for participating in the [place] [facility]. It was our pleasure to meet you today. We will call you with the results of your scan within the next few days. Your scan will be assigned a Lung RADS category score by the physicians reading the scans.  This Lung RADS score determines follow up scanning.  See below for description of categories, and follow up screening recommendations. We will be in touch to schedule your follow up screening annually or based on recommendations of our providers. We will fax a copy of your scan results to your Primary Care Physician, or the physician who referred you to the program, to ensure they have the results. Please call the office if you have any questions or concerns regarding your scanning experience or results.  Our office number is [phone]. Please speak with Denise Phelps, RN. , or  Denise Buckner RN, They are  our Lung Cancer Screening RN.'s If They are unavailable when you call, Please leave a message on the voice mail. We will return your call at our earliest convenience.This voice mail is monitored several times a day.  Remember, if your scan is normal, we will scan you annually as long as you continue to meet the criteria for the program. (Age 75-77, Current smoker or smoker who has quit within the last 15 years). If you are a smoker, remember, quitting is the single most powerful action that you can take to decrease your risk of lung cancer and other pulmonary, breathing related problems. We know quitting is hard, and we are here to help.  Please let us know if there is anything we can do to help you meet your goal of quitting. If you are a former smoker, congratulations. We are proud of you! Remain smoke free! Remember you can refer friends or family members through the number above.  We will screen them to make sure they meet criteria for the program. Thank you for helping us take better care of you by  participating in Lung Screening.  You can receive free nicotine replacement therapy ( patches, gum or mints) by calling 1-800-QUIT NOW. Please call so we can get you on the path to becoming  a non-smoker. I know it is hard, but you can do this!  Lung RADS Categories:  Lung RADS 1: no nodules or definitely non-concerning nodules.  Recommendation is for a repeat annual scan in 12 months.  Lung RADS 2:  nodules that are non-concerning in appearance and behavior with a very low likelihood of becoming an active cancer. Recommendation is for a repeat annual scan in 12 months.  Lung RADS 3: nodules that are probably non-concerning , includes nodules with a low likelihood of becoming an active cancer.  Recommendation is for a 6-month repeat screening scan. Often noted after an upper respiratory illness. We will be in touch to make sure you have no questions, and to schedule your 6-month scan.  Lung RADS 4 A: nodules with concerning findings, recommendation is most often for a follow up scan in 3 months or additional testing based on our provider's assessment of the scan. We will be in touch to make sure you have no questions and to schedule the recommended 3 month follow up scan.  Lung RADS 4 B:  indicates findings that are concerning. We will be in touch with you to schedule additional diagnostic testing based on our provider's  assessment of the scan.  Other options for assistance in smoking cessation (   As covered by your insurance benefits)  Hypnosis for smoking cessation  [employer] [phone]  Acupuncture for smoking cessation  [facility] [phone]   

## 2022-03-20 NOTE — Progress Notes (Signed)
Virtual Visit via Telephone Note  I connected with Alexis Murphy on 03/20/22 at  2:30 PM EDT by telephone and verified that I am speaking with the correct person using two identifiers.  Location: Patient:  At home Provider:  [place], [place], [place], [address]    I discussed the limitations, risks, security and privacy concerns of performing an evaluation and management service by telephone and the availability of in person appointments. I also discussed with the patient that there may be a patient responsible charge related to this service. The patient expressed understanding and agreed to proceed.    Shared Decision Making Visit Lung Cancer Screening Program [phone])   Eligibility: Age 76 y.o. Pack Years Smoking History Calculation  90 pack year smoking history (# packs/per year x # years smoked) Recent History of coughing up blood  no Unexplained weight loss? no ( >Than 15 pounds within the last 6 months ) Prior History Lung / other cancer no (Diagnosis within the last 5 years already requiring surveillance chest CT Scans). Smoking Status Former Smoker Former Smokers: Years since quit: 14 years  Quit Date: 11/04/2007  Visit Components: Discussion included one or more decision making aids. yes Discussion included risk/benefits of screening. yes Discussion included potential follow up diagnostic testing for abnormal scans. yes Discussion included meaning and risk of over diagnosis. yes Discussion included meaning and risk of False Positives. yes Discussion included meaning of total radiation exposure. yes  Counseling Included: Importance of adherence to annual lung cancer LDCT screening. yes Impact of comorbidities on ability to participate in the program. yes Ability and willingness to under diagnostic treatment. yes  Smoking Cessation Counseling: Current Smokers:  Discussed importance of smoking cessation. yes Information about tobacco cessation classes  and interventions provided to patient. yes Patient provided with "ticket" for LDCT Scan. yes Symptomatic Patient. no  Counseling NA Diagnosis Code: Tobacco Use Z72.0 Asymptomatic Patient yes  Counseling (Intermediate counseling: > three minutes counseling) A8341 Former Smokers:  Discussed the importance of maintaining cigarette abstinence. yes Diagnosis Code: Personal History of Nicotine Dependence. D62.229 Information about tobacco cessation classes and interventions provided to patient. Yes Patient provided with "ticket" for LDCT Scan. yes Written Order for Lung Cancer Screening with LDCT placed in Epic. Yes (CT Chest Lung Cancer Screening Low Dose W/O CM) [id number] Z12.2-Screening of respiratory organs Z87.891-Personal history of nicotine dependence  I spent 25 minutes of face to face time/virtual visit time  with  Alexis Murphy discussing the risks and benefits of lung cancer screening. We took the time to pause the power point at intervals to allow for questions to be asked and answered to ensure understanding. We discussed that she had taken the single most powerful action possible to decrease her risk of developing lung cancer when she quit smoking. I counseled her to remain smoke free, and to contact me if she ever had the desire to smoke again so that I can provide resources and tools to help support the effort to remain smoke free. We discussed the time and location of the scan, and that either  Doroteo Glassman RN, Joella Prince, RN or I  or I will call / send a letter with the results within  24-72 hours of receiving them. She has the office contact information in the event she needs to speak with me,  She verbalized understanding of all of the above and had no further questions upon leaving the office.     I explained to the patient  that there has been a high incidence of coronary artery disease noted on these exams. I explained that this is a non-gated exam therefore degree or severity  cannot be determined. This patient is on statin therapy. I have asked the patient to follow-up with their PCP regarding any incidental finding of coronary artery disease and management with diet or medication as they feel is clinically indicated. The patient verbalized understanding of the above and had no further questions.     Magdalen Spatz, NP 03/20/2022

## 2022-03-21 ENCOUNTER — Ambulatory Visit
Admission: RE | Admit: 2022-03-21 | Discharge: 2022-03-21 | Disposition: A | Discharge status: Home / Self Care | Payer: Medicare HMO | Source: Ambulatory Visit | Attending: Acute Care | Admitting: Acute Care

## 2022-03-21 DIAGNOSIS — Z122 Encounter for screening for malignant neoplasm of respiratory organs: Secondary | ICD-10-CM

## 2022-03-21 DIAGNOSIS — Z87891 Personal history of nicotine dependence: Secondary | ICD-10-CM | POA: Diagnosis not present

## 2022-03-21 DIAGNOSIS — K449 Diaphragmatic hernia without obstruction or gangrene: Secondary | ICD-10-CM | POA: Diagnosis not present

## 2022-03-21 DIAGNOSIS — D869 Sarcoidosis, unspecified: Secondary | ICD-10-CM | POA: Diagnosis not present

## 2022-03-21 DIAGNOSIS — I2510 Atherosclerotic heart disease of native coronary artery without angina pectoris: Secondary | ICD-10-CM | POA: Diagnosis not present

## 2022-03-26 ENCOUNTER — Other Ambulatory Visit: Payer: Self-pay | Admitting: Acute Care

## 2022-03-26 DIAGNOSIS — Z87891 Personal history of nicotine dependence: Secondary | ICD-10-CM

## 2022-03-26 DIAGNOSIS — Z122 Encounter for screening for malignant neoplasm of respiratory organs: Secondary | ICD-10-CM

## 2022-04-09 ENCOUNTER — Ambulatory Visit
Admission: RE | Admit: 2022-04-09 | Discharge: 2022-04-09 | Disposition: A | Discharge status: Home / Self Care | Payer: Medicare HMO | Source: Ambulatory Visit | Attending: Family Medicine | Admitting: Family Medicine

## 2022-04-09 DIAGNOSIS — Z1231 Encounter for screening mammogram for malignant neoplasm of breast: Secondary | ICD-10-CM | POA: Diagnosis not present

## 2022-04-26 DIAGNOSIS — H5203 Hypermetropia, bilateral: Secondary | ICD-10-CM | POA: Diagnosis not present

## 2022-04-26 DIAGNOSIS — D869 Sarcoidosis, unspecified: Secondary | ICD-10-CM | POA: Diagnosis not present

## 2022-04-26 DIAGNOSIS — H524 Presbyopia: Secondary | ICD-10-CM | POA: Diagnosis not present

## 2022-04-26 DIAGNOSIS — H52223 Regular astigmatism, bilateral: Secondary | ICD-10-CM | POA: Diagnosis not present

## 2022-04-28 ENCOUNTER — Ambulatory Visit
Admission: RE | Admit: 2022-04-28 | Discharge: 2022-04-28 | Disposition: A | Discharge status: Home / Self Care | Payer: Medicare HMO | Source: Ambulatory Visit | Attending: Sports Medicine | Admitting: Sports Medicine

## 2022-04-28 DIAGNOSIS — M25552 Pain in left hip: Secondary | ICD-10-CM

## 2022-05-03 ENCOUNTER — Other Ambulatory Visit: Payer: Self-pay

## 2022-05-03 DIAGNOSIS — D473 Essential (hemorrhagic) thrombocythemia: Secondary | ICD-10-CM

## 2022-05-07 ENCOUNTER — Other Ambulatory Visit: Payer: Self-pay

## 2022-05-07 ENCOUNTER — Inpatient Hospital Stay: Payer: Medicare HMO | Attending: Hematology | Admitting: Hematology

## 2022-05-07 ENCOUNTER — Inpatient Hospital Stay: Payer: Medicare HMO

## 2022-05-07 VITALS — BP 134/83 | HR 74 | Temp 97.9°F | Resp 15 | Wt 121.1 lb

## 2022-05-07 DIAGNOSIS — Z862 Personal history of diseases of the blood and blood-forming organs and certain disorders involving the immune mechanism: Secondary | ICD-10-CM | POA: Insufficient documentation

## 2022-05-07 DIAGNOSIS — D471 Chronic myeloproliferative disease: Secondary | ICD-10-CM | POA: Insufficient documentation

## 2022-05-07 DIAGNOSIS — D869 Sarcoidosis, unspecified: Secondary | ICD-10-CM | POA: Diagnosis not present

## 2022-05-07 DIAGNOSIS — Z1589 Genetic susceptibility to other disease: Secondary | ICD-10-CM | POA: Diagnosis not present

## 2022-05-07 DIAGNOSIS — Z7982 Long term (current) use of aspirin: Secondary | ICD-10-CM | POA: Insufficient documentation

## 2022-05-07 DIAGNOSIS — N189 Chronic kidney disease, unspecified: Secondary | ICD-10-CM | POA: Diagnosis not present

## 2022-05-07 DIAGNOSIS — Z79899 Other long term (current) drug therapy: Secondary | ICD-10-CM | POA: Diagnosis not present

## 2022-05-07 DIAGNOSIS — Z87891 Personal history of nicotine dependence: Secondary | ICD-10-CM | POA: Diagnosis not present

## 2022-05-07 DIAGNOSIS — D473 Essential (hemorrhagic) thrombocythemia: Secondary | ICD-10-CM | POA: Diagnosis not present

## 2022-05-07 LAB — IRON AND IRON BINDING CAPACITY (CC-WL,HP ONLY)
Iron: 63 ug/dL (ref 28–170)
Saturation Ratios: 22 % (ref 10.4–31.8)
TIBC: 281 ug/dL (ref 250–450)
UIBC: 218 ug/dL (ref 148–442)

## 2022-05-07 LAB — CMP (CANCER CENTER ONLY)
ALT: 11 U/L (ref 0–44)
AST: 15 U/L (ref 15–41)
Albumin: 4.2 g/dL (ref 3.5–5.0)
Alkaline Phosphatase: 77 U/L (ref 38–126)
Anion gap: 6 (ref 5–15)
BUN: 25 mg/dL — ABNORMAL HIGH (ref 8–23)
CO2: 27 mmol/L (ref 22–32)
Calcium: 8.9 mg/dL (ref 8.9–10.3)
Chloride: 105 mmol/L (ref 98–111)
Creatinine: 1.68 mg/dL — ABNORMAL HIGH (ref 0.44–1.00)
GFR, Estimated: 32 mL/min — ABNORMAL LOW (ref >60)
Glucose, Bld: 93 mg/dL (ref 70–99)
Potassium: 3.9 mmol/L (ref 3.5–5.1)
Sodium: 138 mmol/L (ref 135–145)
Total Bilirubin: 0.4 mg/dL (ref 0.3–1.2)
Total Protein: 6.5 g/dL (ref 6.5–8.1)

## 2022-05-07 LAB — CBC WITH DIFFERENTIAL (CANCER CENTER ONLY)
Abs Immature Granulocytes: 0.03 10*3/uL (ref 0.00–0.07)
Basophils Absolute: 0 10*3/uL (ref 0.0–0.1)
Basophils Relative: 1 %
Eosinophils Absolute: 0.1 10*3/uL (ref 0.0–0.5)
Eosinophils Relative: 2 %
HCT: 31.3 % — ABNORMAL LOW (ref 36.0–46.0)
Hemoglobin: 10.5 g/dL — ABNORMAL LOW (ref 12.0–15.0)
Immature Granulocytes: 0 %
Lymphocytes Relative: 18 %
Lymphs Abs: 1.2 10*3/uL (ref 0.7–4.0)
MCH: 33.3 pg (ref 26.0–34.0)
MCHC: 33.5 g/dL (ref 30.0–36.0)
MCV: 99.4 fL (ref 80.0–100.0)
Monocytes Absolute: 0.7 10*3/uL (ref 0.1–1.0)
Monocytes Relative: 10 %
Neutro Abs: 4.8 10*3/uL (ref 1.7–7.7)
Neutrophils Relative %: 69 %
Platelet Count: 388 10*3/uL (ref 150–400)
RBC: 3.15 MIL/uL — ABNORMAL LOW (ref 3.87–5.11)
RDW: 13.8 % (ref 11.5–15.5)
WBC Count: 6.9 10*3/uL (ref 4.0–10.5)
nRBC: 0 % (ref 0.0–0.2)

## 2022-05-07 LAB — FERRITIN: Ferritin: 93 ng/mL (ref 11–307)

## 2022-05-07 NOTE — Progress Notes (Signed)
HEMATOLOGY/ONCOLOGY CLINIC NOTE  Date of Service: 05/07/2022   Patient Care Team: Lawerance Cruel, MD as PCP - General (Family Medicine) [name], MD as PCP - Cardiology (Cardiology)  CHIEF COMPLAINTS/PURPOSE OF CONSULTATION:  follow-up for continued evaluation and management of JAK2 positive MPN  HISTORY OF PRESENTING ILLNESS:  Please see previous notes for details on initial presentation  Interval History:   Alexis Murphy i is here for continued evaluation and management of her JAK2 positive MPN . She notes no acute new symptoms since her last clinic visit. She is tolerating her current dose of hydroxyurea without any prohibitive toxicities.. No new infection issues, bleeding issues or new fatigue. No new bone pains. Labs done today were discussed in detail with the patient.  MEDICAL HISTORY:  Past Medical History:  Diagnosis Date   Arthritis    Hyperlipidemia    Sarcoidosis     SURGICAL HISTORY: Past Surgical History:  Procedure Laterality Date   ABDOMINAL HYSTERECTOMY     BREAST BIOPSY     BREAST SURGERY     REDUCTION MAMMAPLASTY      SOCIAL HISTORY: Social History   Socioeconomic History   Marital status: Widowed    Spouse name: Not on file   Number of children: Not on file   Years of education: Not on file   Highest education level: Not on file  Occupational History   Not on file  Tobacco Use   Smoking status: Former    Packs/day: 2.00    Years: 45.00    Total pack years: 90.00    Types: Cigarettes    Quit date: 10/15/2007    Years since quitting: 14.5   Smokeless tobacco: Former  Scientific laboratory technician Use: Never used  Substance and Sexual Activity   Alcohol use: Yes   Drug use: No   Sexual activity: Not on file  Other Topics Concern   Not on file  Social History Narrative   Not on file   Social Determinants of Health   Financial Resource Strain: Not on file  Food Insecurity: Not on file  Transportation Needs: Not on  file  Physical Activity: Not on file  Stress: Not on file  Social Connections: Not on file  Intimate Partner Violence: Not on file    FAMILY HISTORY: Family History  Problem Relation Age of Onset   Heart disease Father    Heart disease Brother     ALLERGIES:  is allergic to penicillins, codeine, and pentazocine.  MEDICATIONS:  Current Outpatient Medications  Medication Sig Dispense Refill   acetaminophen (TYLENOL) 325 MG tablet Take 1 tablet (325 mg total) by mouth every 6 (six) hours. (Patient taking differently: Take 325 mg by mouth every 6 (six) hours as needed for headache.) 30 tablet 0   amLODipine (NORVASC) 5 MG tablet Take 5 mg by mouth every evening.      aspirin EC 81 MG tablet Take 81 mg by mouth daily.     atorvastatin (LIPITOR) 40 MG tablet Take 40 mg by mouth every evening.      Cholecalciferol (VITAMIN D3) 25 MCG (1000 UT) CAPS TAKE TWO CAPSULES BY MOUTH ONCE DAILY 60 capsule 5   famotidine (PEPCID) 20 MG tablet One after supper 30 tablet 11   folic acid (FOLVITE) 1 MG tablet TAKE ONE TABLET BY MOUTH ONCE DAILY 30 tablet 5   hydroxyurea (HYDREA) 500 MG capsule TAKE ONE CAPSULE ON DAYS MONDAY-THURSDAY. DO not take friday, saturday, OR  sunday. 30 capsule 3   iron polysaccharides (NIFEREX) 150 MG capsule TAKE ONE CAPSULE BY MOUTH ONCE DAILY 30 capsule 2   levothyroxine (SYNTHROID) 50 MCG tablet Take 50 mcg by mouth every morning.     Melatonin 5 MG TABS Take 5 mg by mouth at bedtime.     omeprazole (PRILOSEC) 40 MG capsule Take 40 mg by mouth every evening.      potassium chloride SA (KLOR-CON M) 20 MEQ tablet TAKE ONE TABLET BY MOUTH ONCE daily 30 tablet 1   predniSONE (DELTASONE) 5 MG tablet Take 5 mg by mouth daily.     venlafaxine XR (EFFEXOR-XR) 150 MG 24 hr capsule Take 150 mg by mouth every evening.      No current facility-administered medications for this visit.    REVIEW OF SYSTEMS:   10 Point review of Systems was done is negative except as noted  above.   PHYSICAL EXAMINATION: ECOG FS:2 - Symptomatic, <50% confined to bed  Vitals:   05/07/22 1431  BP: 134/83  Pulse: 74  Resp: 15  Temp: 97.9 F (36.6 C)  SpO2: 99%    Wt Readings from Last 3 Encounters:  05/07/22 121 lb 1.6 oz (54.9 kg)  02/02/22 124 lb 3.2 oz (56.3 kg)  10/09/21 132 lb (59.9 kg)   Body mass index is 24.46 kg/m.   NAD GENERAL:alert, in no acute distress and comfortable SKIN: no acute rashes, no significant lesions EYES: conjunctiva are pink and non-injected, sclera anicteric OROPHARYNX: MMM, no exudates, no oropharyngeal erythema or ulceration NECK: supple, no JVD LYMPH:  no palpable lymphadenopathy in the cervical, axillary or inguinal regions LUNGS: clear to auscultation b/l with normal respiratory effort HEART: regular rate & rhythm ABDOMEN:  normoactive bowel sounds , non tender, not distended. Extremity: no pedal edema PSYCH: alert & oriented x 3 with fluent speech NEURO: no focal motor/sensory deficits  LABORATORY DATA:  I have reviewed the data as listed     Latest Ref Rng & Units 05/07/2022    1:18 PM 02/02/2022    9:46 AM 09/18/2021    2:09 PM  CBC  WBC 4.0 - 10.5 K/uL 6.9  7.8  9.9   Hemoglobin 12.0 - 15.0 g/dL 10.5  10.9  11.0   Hematocrit 36.0 - 46.0 % 31.3  33.4  34.1   Platelets 150 - 400 K/uL 388  339  457       Latest Ref Rng & Units 05/07/2022    1:18 PM 02/02/2022    9:46 AM 09/18/2021    2:09 PM  CMP  Glucose 70 - 99 mg/dL 93  101  90   BUN 8 - 23 mg/dL '25  25  20   '$ Creatinine 0.44 - 1.00 mg/dL 1.68  1.75  1.49   Sodium 135 - 145 mmol/L 138  138  138   Potassium 3.5 - 5.1 mmol/L 3.9  3.9  3.9   Chloride 98 - 111 mmol/L 105  104  105   CO2 22 - 32 mmol/L '27  27  24   '$ Calcium 8.9 - 10.3 mg/dL 8.9  8.9  8.9   Total Protein 6.5 - 8.1 g/dL 6.5  7.1  6.6   Total Bilirubin 0.3 - 1.2 mg/dL 0.4  0.5  0.3   Alkaline Phos 38 - 126 U/L 77  106  127   AST 15 - 41 U/L '15  16  14   '$ ALT 0 - 44 U/L 11  9  10  Lab Results   Component Value Date   IRON 63 05/07/2022   TIBC 281 05/07/2022   IRONPCTSAT 22 05/07/2022   Lab Results  Component Value Date   FERRITIN 162 02/02/2022           RADIOGRAPHIC STUDIES: I have personally reviewed the radiological images as listed and agreed with the findings in the report. MR HIP LEFT WO CONTRAST  Result Date: 05/01/2022 CLINICAL DATA:  Left hip pain.  Difficulty walking. EXAM: MR OF THE LEFT HIP WITHOUT CONTRAST TECHNIQUE: Multiplanar, multisequence MR imaging was performed. No intravenous contrast was administered. COMPARISON:  None Available. FINDINGS: Bones: No hip fracture, dislocation or avascular necrosis. No periosteal reaction or bone destruction. No aggressive osseous lesion. Mild osteoarthritis of bilateral SI joints. No SI joint widening or erosive changes. Degenerative disease with disc height loss at L5-S1 with bilateral facet arthropathy. Articular cartilage and labrum Articular cartilage: Partial-thickness cartilage loss of the left femoral head and acetabulum. Labrum: Left superior labral degeneration and small anterior labral tear. Joint or bursal effusion Joint effusion:  No hip joint effusion.  No SI joint effusion. Bursae:  No bursal fluid. Muscles and tendons Flexors: Normal. Extensors: Normal. Abductors: Normal. Adductors: Normal. Gluteals: Moderate tendinosis of the left gluteus medius tendon with a partial-thickness tear. Partial-thickness tear of the left gluteus minimus tendon insertion. Hamstrings: Mild tendinosis of the left hamstring origin with a small interstitial tear. Other findings No pelvic free fluid. No fluid collection or hematoma. No inguinal lymphadenopathy. No inguinal hernia. IMPRESSION: 1. Moderate tendinosis of the left gluteus medius tendon with a partial-thickness tear. 2. Partial-thickness tear of the left gluteus minimus tendon insertion. 3. Left superior labral degeneration and small anterior labral tear. Mild osteoarthritis of  the left hip. 4. Mild osteoarthritis of bilateral SI joints. 5. Degenerative disease with disc height loss at L5-S1 with bilateral facet arthropathy. Electronically Signed   By: Kathreen Devoid M.D.   On: 05/01/2022 13:28   MM 3D SCREEN BREAST BILATERAL  Result Date: 04/10/2022 CLINICAL DATA:  Screening. EXAM: DIGITAL SCREENING BILATERAL MAMMOGRAM WITH TOMOSYNTHESIS AND CAD TECHNIQUE: Bilateral screening digital craniocaudal and mediolateral oblique mammograms were obtained. Bilateral screening digital breast tomosynthesis was performed. The images were evaluated with computer-aided detection. COMPARISON:  Previous exam(s). ACR Breast Density Category b: There are scattered areas of fibroglandular density. FINDINGS: There are no findings suspicious for malignancy. IMPRESSION: No mammographic evidence of malignancy. A result letter of this screening mammogram will be mailed directly to the patient. RECOMMENDATION: Screening mammogram in one year. (Code:SM-B-01Y) BI-RADS CATEGORY  1: Negative. Electronically Signed   By: Lovey Newcomer M.D.   On: 04/10/2022 07:30    ASSESSMENT & PLAN:   76 y.o. female with  1.  Multisystem sarcoidosis -follows with rheumatology Dr. Amil Amen Presented with Generalized lymphadenopathy with multiple pulmonary nodules CT Chest 09/19/2017: Moderate mediastinal and right supraclavicular lymphadenopathy, increased. New mild bilateral axillary and bilateral hilar lymphadenopathy. Findings are worrisome for a malignant process such as a lymphoproliferative disorder or metastatic nodal disease. The right supraclavicular node may be (but not definitely) accessible for percutaneous biopsy. 2. Numerous similar-appearing subsolid spiculated pulmonary nodules scattered throughout both lungs, upper lobe predominant, largest 1.6 cm, stable to mildly increased in size and slightly increased in density in the interval. 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  12/26/17 CT C/A/P revealed interval response to steroids in LNadenopathy   07/17/18 CT C/A/P revealed Continue improvement in mediastinal adenopathy. Lymph nodes are now not  pathologic by size criteria. 2. Continued improvement in nodular and ground-glass densities particularly in the RIGHT upper lobe. 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  2. Breast - lesion - Bx -- granulomatous inflammation -consistent with sarcoidosis.  3. Skin rash on nose - Bx consistent with sarcoidosis.  4. H/o  Iron deficiency - HGB of 10.5, Iron of 63, and TIBC of 281 on 04/27/22. Ferritin of 162 on 02/02/2022.  5.  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 CBC and iron studies stable. -Currently on oral Ferrex and can continue this until her iron saturation at about 30% and ferritin about 250 given her chronic kidney disease. -Continue follow-up with Dr. Amil Amen. -Patient is currently on prednisone 5 mg p.o. daily and leflunomide. -CMP stable with chronic kidney disease creatinine 1.68 -Her renal function is somewhat better but is still fluctuating. Also due to inflammation from sarcoidosis that is a reactive com continue hydroxyurea at 500 mg 3 days a week. -No symptoms suggestive of abnormal bleeding or bruising.  No symptoms suggestive of VTE. -Continue aspirin 81 mg p.o. daily -Continue vitamin B complex and folic acid daily -Continue B complex and folic acid  FOLLOW UP:.Sullivan Lone MD [place] AAHIVMS [facility] Hematology/Oncology Physician [facility]  .*Total Encounter Time as defined by the Centers for Medicare and Medicaid Services includes, in addition to the face-to-face time of a patient visit (documented in the note above) non-face-to-face time: obtaining and reviewing outside history, ordering and reviewing medications, tests or procedures, care coordination (communications with other health care professionals or caregivers) and documentation in the medical record.   I,Alexis Herring,acting as a Education administrator for Sullivan Lone, MD.,have documented all relevant documentation on the behalf of Sullivan Lone, MD,as directed by  Sullivan Lone, MD while in the presence of Sullivan Lone, MD.

## 2022-05-09 DIAGNOSIS — H4389 Other disorders of vitreous body: Secondary | ICD-10-CM | POA: Diagnosis not present

## 2022-05-13 ENCOUNTER — Encounter: Payer: Self-pay | Admitting: Hematology

## 2022-05-15 DIAGNOSIS — H348112 Central retinal vein occlusion, right eye, stable: Secondary | ICD-10-CM | POA: Diagnosis not present

## 2022-05-15 DIAGNOSIS — Z6822 Body mass index (BMI) 22.0-22.9, adult: Secondary | ICD-10-CM | POA: Diagnosis not present

## 2022-05-15 DIAGNOSIS — D473 Essential (hemorrhagic) thrombocythemia: Secondary | ICD-10-CM | POA: Diagnosis not present

## 2022-05-15 DIAGNOSIS — L52 Erythema nodosum: Secondary | ICD-10-CM | POA: Diagnosis not present

## 2022-05-15 DIAGNOSIS — M25552 Pain in left hip: Secondary | ICD-10-CM | POA: Diagnosis not present

## 2022-05-15 DIAGNOSIS — D869 Sarcoidosis, unspecified: Secondary | ICD-10-CM | POA: Diagnosis not present

## 2022-05-15 DIAGNOSIS — N1832 Chronic kidney disease, stage 3b: Secondary | ICD-10-CM | POA: Diagnosis not present

## 2022-05-15 DIAGNOSIS — I7300 Raynaud's syndrome without gangrene: Secondary | ICD-10-CM | POA: Diagnosis not present

## 2022-05-15 DIAGNOSIS — Z7952 Long term (current) use of systemic steroids: Secondary | ICD-10-CM | POA: Diagnosis not present

## 2022-05-15 DIAGNOSIS — Z1589 Genetic susceptibility to other disease: Secondary | ICD-10-CM | POA: Diagnosis not present

## 2022-05-15 DIAGNOSIS — M170 Bilateral primary osteoarthritis of knee: Secondary | ICD-10-CM | POA: Diagnosis not present

## 2022-05-18 ENCOUNTER — Other Ambulatory Visit: Payer: Self-pay | Admitting: Hematology

## 2022-05-23 DIAGNOSIS — D869 Sarcoidosis, unspecified: Secondary | ICD-10-CM | POA: Diagnosis not present

## 2022-05-23 DIAGNOSIS — H35372 Puckering of macula, left eye: Secondary | ICD-10-CM | POA: Diagnosis not present

## 2022-05-23 DIAGNOSIS — H348332 Tributary (branch) retinal vein occlusion, bilateral, stable: Secondary | ICD-10-CM | POA: Diagnosis not present

## 2022-05-23 DIAGNOSIS — H2513 Age-related nuclear cataract, bilateral: Secondary | ICD-10-CM | POA: Diagnosis not present

## 2022-05-25 ENCOUNTER — Other Ambulatory Visit: Payer: Self-pay | Admitting: Hematology

## 2022-05-25 DIAGNOSIS — D473 Essential (hemorrhagic) thrombocythemia: Secondary | ICD-10-CM

## 2022-06-27 DIAGNOSIS — I10 Essential (primary) hypertension: Secondary | ICD-10-CM | POA: Diagnosis not present

## 2022-06-27 DIAGNOSIS — K219 Gastro-esophageal reflux disease without esophagitis: Secondary | ICD-10-CM | POA: Diagnosis not present

## 2022-06-27 DIAGNOSIS — J449 Chronic obstructive pulmonary disease, unspecified: Secondary | ICD-10-CM | POA: Diagnosis not present

## 2022-06-27 DIAGNOSIS — R69 Illness, unspecified: Secondary | ICD-10-CM | POA: Diagnosis not present

## 2022-06-27 DIAGNOSIS — E7800 Pure hypercholesterolemia, unspecified: Secondary | ICD-10-CM | POA: Diagnosis not present

## 2022-06-27 DIAGNOSIS — E039 Hypothyroidism, unspecified: Secondary | ICD-10-CM | POA: Diagnosis not present

## 2022-06-27 DIAGNOSIS — M1990 Unspecified osteoarthritis, unspecified site: Secondary | ICD-10-CM | POA: Diagnosis not present

## 2022-06-27 DIAGNOSIS — N1832 Chronic kidney disease, stage 3b: Secondary | ICD-10-CM | POA: Diagnosis not present

## 2022-07-10 DIAGNOSIS — M25561 Pain in right knee: Secondary | ICD-10-CM | POA: Diagnosis not present

## 2022-07-10 DIAGNOSIS — M25562 Pain in left knee: Secondary | ICD-10-CM | POA: Diagnosis not present

## 2022-07-11 DIAGNOSIS — M25561 Pain in right knee: Secondary | ICD-10-CM | POA: Diagnosis not present

## 2022-07-11 DIAGNOSIS — M25562 Pain in left knee: Secondary | ICD-10-CM | POA: Diagnosis not present

## 2022-07-30 DIAGNOSIS — I2510 Atherosclerotic heart disease of native coronary artery without angina pectoris: Secondary | ICD-10-CM | POA: Diagnosis not present

## 2022-07-30 DIAGNOSIS — F419 Anxiety disorder, unspecified: Secondary | ICD-10-CM | POA: Diagnosis not present

## 2022-07-30 DIAGNOSIS — F325 Major depressive disorder, single episode, in full remission: Secondary | ICD-10-CM | POA: Diagnosis not present

## 2022-07-30 DIAGNOSIS — E785 Hyperlipidemia, unspecified: Secondary | ICD-10-CM | POA: Diagnosis not present

## 2022-07-30 DIAGNOSIS — R32 Unspecified urinary incontinence: Secondary | ICD-10-CM | POA: Diagnosis not present

## 2022-07-30 DIAGNOSIS — M1990 Unspecified osteoarthritis, unspecified site: Secondary | ICD-10-CM | POA: Diagnosis not present

## 2022-07-30 DIAGNOSIS — I10 Essential (primary) hypertension: Secondary | ICD-10-CM | POA: Diagnosis not present

## 2022-07-30 DIAGNOSIS — K08409 Partial loss of teeth, unspecified cause, unspecified class: Secondary | ICD-10-CM | POA: Diagnosis not present

## 2022-07-30 DIAGNOSIS — M810 Age-related osteoporosis without current pathological fracture: Secondary | ICD-10-CM | POA: Diagnosis not present

## 2022-07-30 DIAGNOSIS — G629 Polyneuropathy, unspecified: Secondary | ICD-10-CM | POA: Diagnosis not present

## 2022-07-30 DIAGNOSIS — K219 Gastro-esophageal reflux disease without esophagitis: Secondary | ICD-10-CM | POA: Diagnosis not present

## 2022-07-30 DIAGNOSIS — Z008 Encounter for other general examination: Secondary | ICD-10-CM | POA: Diagnosis not present

## 2022-07-30 DIAGNOSIS — I4891 Unspecified atrial fibrillation: Secondary | ICD-10-CM | POA: Diagnosis not present

## 2022-07-30 DIAGNOSIS — R69 Illness, unspecified: Secondary | ICD-10-CM | POA: Diagnosis not present

## 2022-08-15 ENCOUNTER — Other Ambulatory Visit: Payer: Self-pay | Admitting: Hematology

## 2022-08-15 DIAGNOSIS — M79671 Pain in right foot: Secondary | ICD-10-CM | POA: Diagnosis not present

## 2022-08-15 DIAGNOSIS — M79672 Pain in left foot: Secondary | ICD-10-CM | POA: Diagnosis not present

## 2022-08-15 DIAGNOSIS — L600 Ingrowing nail: Secondary | ICD-10-CM | POA: Diagnosis not present

## 2022-08-29 DIAGNOSIS — L600 Ingrowing nail: Secondary | ICD-10-CM | POA: Diagnosis not present

## 2022-09-03 ENCOUNTER — Other Ambulatory Visit: Payer: Self-pay

## 2022-09-03 DIAGNOSIS — D473 Essential (hemorrhagic) thrombocythemia: Secondary | ICD-10-CM

## 2022-09-03 DIAGNOSIS — D509 Iron deficiency anemia, unspecified: Secondary | ICD-10-CM

## 2022-09-05 ENCOUNTER — Inpatient Hospital Stay: Payer: Medicare HMO | Admitting: Hematology

## 2022-09-05 ENCOUNTER — Other Ambulatory Visit: Payer: Self-pay

## 2022-09-05 ENCOUNTER — Inpatient Hospital Stay: Payer: Medicare HMO | Attending: Hematology

## 2022-09-05 VITALS — BP 157/58 | HR 84 | Temp 97.7°F | Resp 18 | Wt 123.4 lb

## 2022-09-05 DIAGNOSIS — D509 Iron deficiency anemia, unspecified: Secondary | ICD-10-CM

## 2022-09-05 DIAGNOSIS — Z862 Personal history of diseases of the blood and blood-forming organs and certain disorders involving the immune mechanism: Secondary | ICD-10-CM | POA: Diagnosis not present

## 2022-09-05 DIAGNOSIS — D869 Sarcoidosis, unspecified: Secondary | ICD-10-CM | POA: Insufficient documentation

## 2022-09-05 DIAGNOSIS — D471 Chronic myeloproliferative disease: Secondary | ICD-10-CM | POA: Diagnosis not present

## 2022-09-05 DIAGNOSIS — Z7982 Long term (current) use of aspirin: Secondary | ICD-10-CM | POA: Insufficient documentation

## 2022-09-05 DIAGNOSIS — D473 Essential (hemorrhagic) thrombocythemia: Secondary | ICD-10-CM

## 2022-09-05 DIAGNOSIS — Z87891 Personal history of nicotine dependence: Secondary | ICD-10-CM | POA: Insufficient documentation

## 2022-09-05 DIAGNOSIS — Z79899 Other long term (current) drug therapy: Secondary | ICD-10-CM | POA: Diagnosis not present

## 2022-09-05 DIAGNOSIS — Z1589 Genetic susceptibility to other disease: Secondary | ICD-10-CM | POA: Insufficient documentation

## 2022-09-05 DIAGNOSIS — N189 Chronic kidney disease, unspecified: Secondary | ICD-10-CM | POA: Insufficient documentation

## 2022-09-05 LAB — CBC WITH DIFFERENTIAL (CANCER CENTER ONLY)
Abs Immature Granulocytes: 0.07 10*3/uL (ref 0.00–0.07)
Basophils Absolute: 0.1 10*3/uL (ref 0.0–0.1)
Basophils Relative: 1 %
Eosinophils Absolute: 0.2 10*3/uL (ref 0.0–0.5)
Eosinophils Relative: 1 %
HCT: 33.6 % — ABNORMAL LOW (ref 36.0–46.0)
Hemoglobin: 11.2 g/dL — ABNORMAL LOW (ref 12.0–15.0)
Immature Granulocytes: 1 %
Lymphocytes Relative: 11 %
Lymphs Abs: 1.3 10*3/uL (ref 0.7–4.0)
MCH: 32.2 pg (ref 26.0–34.0)
MCHC: 33.3 g/dL (ref 30.0–36.0)
MCV: 96.6 fL (ref 80.0–100.0)
Monocytes Absolute: 0.9 10*3/uL (ref 0.1–1.0)
Monocytes Relative: 7 %
Neutro Abs: 9.5 10*3/uL — ABNORMAL HIGH (ref 1.7–7.7)
Neutrophils Relative %: 79 %
Platelet Count: 500 10*3/uL — ABNORMAL HIGH (ref 150–400)
RBC: 3.48 MIL/uL — ABNORMAL LOW (ref 3.87–5.11)
RDW: 14.6 % (ref 11.5–15.5)
WBC Count: 11.9 10*3/uL — ABNORMAL HIGH (ref 4.0–10.5)
nRBC: 0 % (ref 0.0–0.2)

## 2022-09-05 LAB — CMP (CANCER CENTER ONLY)
ALT: 10 U/L (ref 0–44)
AST: 15 U/L (ref 15–41)
Albumin: 4.4 g/dL (ref 3.5–5.0)
Alkaline Phosphatase: 92 U/L (ref 38–126)
Anion gap: 9 (ref 5–15)
BUN: 21 mg/dL (ref 8–23)
CO2: 25 mmol/L (ref 22–32)
Calcium: 8.6 mg/dL — ABNORMAL LOW (ref 8.9–10.3)
Chloride: 104 mmol/L (ref 98–111)
Creatinine: 1.51 mg/dL — ABNORMAL HIGH (ref 0.44–1.00)
GFR, Estimated: 36 mL/min — ABNORMAL LOW (ref >60)
Glucose, Bld: 81 mg/dL (ref 70–99)
Potassium: 3.7 mmol/L (ref 3.5–5.1)
Sodium: 138 mmol/L (ref 135–145)
Total Bilirubin: 0.3 mg/dL (ref 0.3–1.2)
Total Protein: 6.5 g/dL (ref 6.5–8.1)

## 2022-09-05 LAB — IRON AND IRON BINDING CAPACITY (CC-WL,HP ONLY)
Iron: 38 ug/dL (ref 28–170)
Saturation Ratios: 12 % (ref 10.4–31.8)
TIBC: 315 ug/dL (ref 250–450)
UIBC: 277 ug/dL (ref 148–442)

## 2022-09-05 LAB — FERRITIN: Ferritin: 71 ng/mL (ref 11–307)

## 2022-09-11 NOTE — Progress Notes (Signed)
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  She reports she is doing well and has no acute new symptoms. Notable toxicities from her hydroxyurea at the current dose. No new infections. She notes she is up-to-date with her vaccinations. Continues to be on low-dose prednisone for her sarcoidosis. Labs done today were reviewed with her in detail.  MEDICAL HISTORY:  Past Medical History:  Diagnosis Date   Arthritis    Hyperlipidemia    Sarcoidosis     SURGICAL HISTORY: Past Surgical History:  Procedure Laterality Date   ABDOMINAL HYSTERECTOMY     BREAST BIOPSY     BREAST SURGERY     REDUCTION MAMMAPLASTY      SOCIAL HISTORY: Social History   Socioeconomic History   Marital status: Widowed    Spouse name: Not on file   Number of children: Not on file   Years of education: Not on file   Highest education level: Not on file  Occupational History   Not on file  Tobacco Use   Smoking status: Former    Packs/day: 1.00    Years: 45.00    Total pack years: 45.00    Types: Cigarettes    Quit date: 10/15/2007    Years since quitting: 14.3   Smokeless tobacco: Never  Vaping Use   Vaping Use: Never used  Substance and Sexual Activity   Alcohol use: Yes   Drug use: No   Sexual activity: Not on file  Other Topics Concern   Not on file  Social History Narrative   Not on file   Social Determinants of Health   Financial Resource Strain: Not on file  Food Insecurity: Not on file  Transportation Needs: Not on  file  Physical Activity: Not on file  Stress: Not on file  Social Connections: Not on file  Intimate Partner Violence: Not on file    FAMILY HISTORY: Family History  Problem Relation Age of Onset   Heart disease Father    Heart disease Brother     ALLERGIES:  is allergic to penicillins, codeine, and pentazocine.  MEDICATIONS:  Current Outpatient Medications  Medication Sig Dispense Refill   acetaminophen (TYLENOL) 325 MG tablet Take 1 tablet (325 mg total) by mouth every 6 (six) hours. (Patient taking differently: Take 325 mg by mouth every 6 (six) hours as needed for headache.) 30 tablet 0   amLODipine (NORVASC) 5 MG tablet Take 5 mg by mouth every evening.      aspirin EC 81 MG tablet Take 81 mg by mouth daily.     atorvastatin (LIPITOR) 40 MG tablet Take 40 mg by mouth every evening.      Cholecalciferol (VITAMIN D3) 25 MCG (1000 UT) CAPS TAKE TWO CAPSULES BY MOUTH ONCE DAILY 60 capsule 5   famotidine (PEPCID) 20 MG tablet One after supper 30 tablet 11   FERREX 150 150 MG capsule TAKE ONE CAPSULE BY MOUTH ONCE DAILY 30 capsule 2   folic acid (FOLVITE) 1 MG tablet TAKE ONE TABLET BY MOUTH ONCE DAILY 30 tablet 5  hydroxyurea (HYDREA) 500 MG capsule TAKE ONE CAPSULE ON DAYS MONDAY-THURSDAY. DO not take friday, saturday, OR sunday. 30 capsule 3   levothyroxine (SYNTHROID) 50 MCG tablet Take 50 mcg by mouth every morning.     Melatonin 5 MG TABS Take 5 mg by mouth at bedtime.     omeprazole (PRILOSEC) 40 MG capsule Take 40 mg by mouth every evening.      potassium chloride SA (KLOR-CON M) 20 MEQ tablet TAKE ONE TABLET BY MOUTH ONCE daily 30 tablet 1   predniSONE (DELTASONE) 5 MG tablet Take 5 mg by mouth daily.     venlafaxine XR (EFFEXOR-XR) 150 MG 24 hr capsule Take 150 mg by mouth every evening.      No current facility-administered medications for this visit.    REVIEW OF SYSTEMS:   10 Point review of Systems was done is negative except as noted above.  PHYSICAL  EXAMINATION: ECOG FS:2 - Symptomatic, <50% confined to bed  Vitals:   02/02/22 1025  BP: 138/61  Pulse: 95  Resp: 17  Temp: (!) 97.5 F (36.4 C)  SpO2: 93%    Wt Readings from Last 3 Encounters:  02/02/22 124 lb 3.2 oz (56.3 kg)  10/09/21 132 lb (59.9 kg)  09/18/21 133 lb 1 oz (60.4 kg)   Body mass index is 25.09 kg/m.    NAD GENERAL:alert, in no acute distress and comfortable SKIN: no acute rashes, no significant lesions EYES: conjunctiva are pink and non-injected, sclera anicteric NECK: supple, no JVD LYMPH:  no palpable lymphadenopathy in the cervical, axillary or inguinal regions LUNGS: Mild scattered rhonchi. HEART: regular rate & rhythm ABDOMEN:  normoactive bowel sounds , non tender, not distended. Extremity: no pedal edema PSYCH: alert & oriented x 3 with fluent speech NEURO: no focal motor/sensory deficits  LABORATORY DATA:  I have reviewed the data as listed     Latest Ref Rng & Units 09/05/2022   11:49 AM 05/07/2022    1:18 PM 02/02/2022    9:46 AM  CBC  WBC 4.0 - 10.5 K/uL 11.9  6.9  7.8   Hemoglobin 12.0 - 15.0 g/dL 11.2  10.5  10.9   Hematocrit 36.0 - 46.0 % 33.6  31.3  33.4   Platelets 150 - 400 K/uL 500  388  339     .    Latest Ref Rng & Units 09/05/2022   11:49 AM 05/07/2022    1:18 PM 02/02/2022    9:46 AM  CMP  Glucose 70 - 99 mg/dL 81  93  101   BUN 8 - 23 mg/dL '21  25  25   '$ Creatinine 0.44 - 1.00 mg/dL 1.51  1.68  1.75   Sodium 135 - 145 mmol/L 138  138  138   Potassium 3.5 - 5.1 mmol/L 3.7  3.9  3.9   Chloride 98 - 111 mmol/L 104  105  104   CO2 22 - 32 mmol/L '25  27  27   '$ Calcium 8.9 - 10.3 mg/dL 8.6  8.9  8.9   Total Protein 6.5 - 8.1 g/dL 6.5  6.5  7.1   Total Bilirubin 0.3 - 1.2 mg/dL 0.3  0.4  0.5   Alkaline Phos 38 - 126 U/L 92  77  106   AST 15 - 41 U/L '15  15  16   '$ ALT 0 - 44 U/L '10  11  9    '$ . Lab Results  Component Value Date   IRON 81 09/18/2021  TIBC 283 09/18/2021   IRONPCTSAT 29 09/18/2021   (Iron and  TIBC)  Lab Results  Component Value Date   FERRITIN 229 09/18/2021           RADIOGRAPHIC STUDIES: I have personally reviewed the radiological images as listed and agreed with the findings in the report. No results found.  ASSESSMENT & PLAN:   77 y.o. female with  1.  Multisystem sarcoidosis -follows with rheumatology Dr. Amil Amen Presented with Generalized lymphadenopathy with multiple pulmonary nodules CT Chest 09/19/2017: Moderate mediastinal and right supraclavicular lymphadenopathy, increased. New mild bilateral axillary and bilateral hilar lymphadenopathy. Findings are worrisome for a malignant process such as a lymphoproliferative disorder or metastatic nodal disease. The right supraclavicular node may be (but not definitely) accessible for percutaneous biopsy. 2. Numerous similar-appearing subsolid spiculated pulmonary nodules scattered throughout both lungs, upper lobe predominant, largest 1.6 cm, stable to mildly increased in size and slightly increased in density in the interval. These are indeterminate, with multifocal lung adenocarcinoma on the differential.  12/26/17 CT C/A/P revealed interval response to steroids in LNadenopathy   07/17/18 CT C/A/P revealed Continue improvement in mediastinal adenopathy. Lymph nodes are now not pathologic by size criteria. 2. Continued improvement in nodular and ground-glass densities particularly in the RIGHT upper lobe. 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  2. Breast - lesion - Bx -- granulomatous inflammation -consistent with sarcoidosis.  3. Skin rash on nose - Bx consistent with sarcoidosis. PLAN -Continue follow-up with Dr. Amil Amen. -Patient is currently on prednisone 5 mg p.o. daily and leflunomide.  4. H/o  Iron deficiency - no significant anemia . Lab Results  Component Value Date   IRON 77 02/02/2022   TIBC 279 02/02/2022   IRONPCTSAT 28 02/02/2022   (Iron and TIBC)  Lab Results  Component  Value Date   FERRITIN 162 02/02/2022   5.  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 4 times a week No symptoms suggestive of abnormal bleeding or bruising.  No symptoms suggestive of VTE. -Continue aspirin 81 mg p.o. daily -Continue vitamin B complex and folic acid daily  FOLLOW UP: RTC with Dr Irene Limbo with labs in 4 months   .The total time spent in the appointment was 21 minutes* .  All of the patient's questions were answered with apparent satisfaction. The patient knows to call the clinic with any problems, questions or concerns.   Sullivan Lone MD MS AAHIVMS [facility] Hematology/Oncology Physician [facility]  .*Total Encounter Time as defined by the Centers for Medicare and Medicaid Services includes, in addition to the face-to-face time of a patient visit (documented in the note above) non-face-to-face time: obtaining and reviewing outside history, ordering and reviewing medications, tests or procedures, care coordination (communications with other health care professionals or caregivers) and documentation in the medical record.  .I have reviewed the above documentation for accuracy and  completeness, and I agree with the above. Brunetta Genera MD

## 2022-09-11 NOTE — Progress Notes (Incomplete)
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  She reports she is doing well and has no acute new symptoms. Notable toxicities from her hydroxyurea at the current dose. No new infections. She notes she is up-to-date with her vaccinations. Continues to be on low-dose prednisone for her sarcoidosis. Labs done today were reviewed with her in detail.  MEDICAL HISTORY:  Past Medical History:  Diagnosis Date  . Arthritis   . Hyperlipidemia   . Sarcoidosis     SURGICAL HISTORY: Past Surgical History:  Procedure Laterality Date  . ABDOMINAL HYSTERECTOMY    . BREAST BIOPSY    . BREAST SURGERY    . REDUCTION MAMMAPLASTY      SOCIAL HISTORY: Social History   Socioeconomic History  . Marital status: Widowed    Spouse name: Not on file  . Number of children: Not on file  . Years of education: Not on file  . Highest education level: Not on file  Occupational History  . Not on file  Tobacco Use  . Smoking status: Former    Packs/day: 1.00    Years: 45.00    Total pack years: 45.00    Types: Cigarettes    Quit date: 10/15/2007    Years since quitting: 14.3  . Smokeless tobacco: Never  Vaping Use  . Vaping Use: Never used  Substance and Sexual Activity  . Alcohol use: Yes  . Drug use: No  . Sexual activity: Not on file  Other Topics Concern  . Not on file  Social History Narrative  . Not on file   Social Determinants of Health   Financial Resource Strain: Not on file  Food Insecurity: Not on file   Transportation Needs: Not on file  Physical Activity: Not on file  Stress: Not on file  Social Connections: Not on file  Intimate Partner Violence: Not on file    FAMILY HISTORY: Family History  Problem Relation Age of Onset  . Heart disease Father   . Heart disease Brother     ALLERGIES:  is allergic to penicillins, codeine, and pentazocine.  MEDICATIONS:  Current Outpatient Medications  Medication Sig Dispense Refill  . acetaminophen (TYLENOL) 325 MG tablet Take 1 tablet (325 mg total) by mouth every 6 (six) hours. (Patient taking differently: Take 325 mg by mouth every 6 (six) hours as needed for headache.) 30 tablet 0  . amLODipine (NORVASC) 5 MG tablet Take 5 mg by mouth every evening.     Marland Kitchen aspirin EC 81 MG tablet Take 81 mg by mouth daily.    Marland Kitchen atorvastatin (LIPITOR) 40 MG tablet Take 40 mg by mouth every evening.     . Cholecalciferol (VITAMIN D3) 25 MCG (1000 UT) CAPS TAKE TWO CAPSULES BY MOUTH ONCE DAILY 60 capsule 5  . famotidine (PEPCID) 20 MG tablet One after supper 30 tablet 11  . FERREX 150 150 MG capsule TAKE ONE CAPSULE BY MOUTH ONCE DAILY 30 capsule 2  . folic acid (FOLVITE) 1 MG tablet TAKE ONE TABLET BY MOUTH ONCE DAILY 30 tablet 5  .  hydroxyurea (HYDREA) 500 MG capsule TAKE ONE CAPSULE ON DAYS MONDAY-THURSDAY. DO not take friday, saturday, OR sunday. 30 capsule 3  . levothyroxine (SYNTHROID) 50 MCG tablet Take 50 mcg by mouth every morning.    . Melatonin 5 MG TABS Take 5 mg by mouth at bedtime.    Marland Kitchen omeprazole (PRILOSEC) 40 MG capsule Take 40 mg by mouth every evening.     . potassium chloride SA (KLOR-CON M) 20 MEQ tablet TAKE ONE TABLET BY MOUTH ONCE daily 30 tablet 1  . predniSONE (DELTASONE) 5 MG tablet Take 5 mg by mouth daily.    Marland Kitchen venlafaxine XR (EFFEXOR-XR) 150 MG 24 hr capsule Take 150 mg by mouth every evening.      No current facility-administered medications for this visit.    REVIEW OF SYSTEMS:   10 Point review of Systems was done is  negative except as noted above.  PHYSICAL EXAMINATION: ECOG FS:2 - Symptomatic, <50% confined to bed  Vitals:   02/02/22 1025  BP: 138/61  Pulse: 95  Resp: 17  Temp: (!) 97.5 F (36.4 C)  SpO2: 93%    Wt Readings from Last 3 Encounters:  02/02/22 124 lb 3.2 oz (56.3 kg)  10/09/21 132 lb (59.9 kg)  09/18/21 133 lb 1 oz (60.4 kg)   Body mass index is 25.09 kg/m.    NAD GENERAL:alert, in no acute distress and comfortable SKIN: no acute rashes, no significant lesions EYES: conjunctiva are pink and non-injected, sclera anicteric NECK: supple, no JVD LYMPH:  no palpable lymphadenopathy in the cervical, axillary or inguinal regions LUNGS: Mild scattered rhonchi. HEART: regular rate & rhythm ABDOMEN:  normoactive bowel sounds , non tender, not distended. Extremity: no pedal edema PSYCH: alert & oriented x 3 with fluent speech NEURO: no focal motor/sensory deficits  LABORATORY DATA:  I have reviewed the data as listed     Latest Ref Rng & Units 09/05/2022   11:49 AM 05/07/2022    1:18 PM 02/02/2022    9:46 AM  CBC  WBC 4.0 - 10.5 K/uL 11.9  6.9  7.8   Hemoglobin 12.0 - 15.0 g/dL 11.2  10.5  10.9   Hematocrit 36.0 - 46.0 % 33.6  31.3  33.4   Platelets 150 - 400 K/uL 500  388  339     .    Latest Ref Rng & Units 09/05/2022   11:49 AM 05/07/2022    1:18 PM 02/02/2022    9:46 AM  CMP  Glucose 70 - 99 mg/dL 81  93  101   BUN 8 - 23 mg/dL '21  25  25   '$ Creatinine 0.44 - 1.00 mg/dL 1.51  1.68  1.75   Sodium 135 - 145 mmol/L 138  138  138   Potassium 3.5 - 5.1 mmol/L 3.7  3.9  3.9   Chloride 98 - 111 mmol/L 104  105  104   CO2 22 - 32 mmol/L '25  27  27   '$ Calcium 8.9 - 10.3 mg/dL 8.6  8.9  8.9   Total Protein 6.5 - 8.1 g/dL 6.5  6.5  7.1   Total Bilirubin 0.3 - 1.2 mg/dL 0.3  0.4  0.5   Alkaline Phos 38 - 126 U/L 92  77  106   AST 15 - 41 U/L '15  15  16   '$ ALT 0 - 44 U/L '10  11  9    '$ . Lab Results  Component Value Date   IRON 81 09/18/2021  TIBC 283 09/18/2021    IRONPCTSAT 29 09/18/2021   (Iron and TIBC)  Lab Results  Component Value Date   FERRITIN 229 09/18/2021           RADIOGRAPHIC STUDIES: I have personally reviewed the radiological images as listed and agreed with the findings in the report. No results found.  ASSESSMENT & PLAN:   77 y.o. female with  1.  Multisystem sarcoidosis -follows with rheumatology Dr. Amil Amen Presented with Generalized lymphadenopathy with multiple pulmonary nodules CT Chest 09/19/2017: Moderate mediastinal and right supraclavicular lymphadenopathy, increased. New mild bilateral axillary and bilateral hilar lymphadenopathy. Findings are worrisome for a malignant process such as a lymphoproliferative disorder or metastatic nodal disease. The right supraclavicular node may be (but not definitely) accessible for percutaneous biopsy. 2. Numerous similar-appearing subsolid spiculated pulmonary nodules scattered throughout both lungs, upper lobe predominant, largest 1.6 cm, stable to mildly increased in size and slightly increased in density in the interval. These are indeterminate, with multifocal lung adenocarcinoma on the differential.  12/26/17 CT C/A/P revealed interval response to steroids in LNadenopathy   07/17/18 CT C/A/P revealed Continue improvement in mediastinal adenopathy. Lymph nodes are now not pathologic by size criteria. 2. Continued improvement in nodular and ground-glass densities particularly in the RIGHT upper lobe. 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  2. Breast - lesion - Bx -- granulomatous inflammation -consistent with sarcoidosis.  3. Skin rash on nose - Bx consistent with sarcoidosis. PLAN -Continue follow-up with Dr. Amil Amen. -Patient is currently on prednisone 5 mg p.o. daily and leflunomide.  4. H/o  Iron deficiency - no significant anemia . Lab Results  Component Value Date   IRON 77 02/02/2022   TIBC 279 02/02/2022   IRONPCTSAT 28 02/02/2022   (Iron  and TIBC)  Lab Results  Component Value Date   FERRITIN 162 02/02/2022   5.  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 4 times a week No symptoms suggestive of abnormal bleeding or bruising.  No symptoms suggestive of VTE. -Continue aspirin 81 mg p.o. daily -Continue vitamin B complex and folic acid daily  FOLLOW UP: RTC with Dr Irene Limbo with labs in 3 months  The total time spent in the appointment was 20 minutes*.  All of the patient's questions were answered with apparent satisfaction. The patient knows to call the clinic with any problems, questions or concerns.   Sullivan Lone MD MS AAHIVMS [facility] Hematology/Oncology Physician [facility]  .*Total Encounter Time as defined by the Centers for Medicare and Medicaid Services includes, in addition to the face-to-face time of a patient visit (documented in the note above) non-face-to-face time: obtaining and reviewing outside history, ordering and reviewing medications, tests or procedures, care coordination (communications with other health care professionals or caregivers) and documentation in the medical record.  I, Melene Muller, am acting as scribe for Dr. Sullivan Lone,  MD.  .I have reviewed the above documentation for accuracy and completeness, and I agree with the above. Brunetta Genera MD

## 2022-09-12 ENCOUNTER — Encounter: Payer: Self-pay | Admitting: Hematology

## 2022-09-12 DIAGNOSIS — T8189XA Other complications of procedures, not elsewhere classified, initial encounter: Secondary | ICD-10-CM | POA: Diagnosis not present

## 2022-10-02 ENCOUNTER — Telehealth: Payer: Self-pay | Admitting: Hematology

## 2022-10-02 NOTE — Telephone Encounter (Signed)
Scheduled per 03/25 scheduling message, patient has been called and notified.

## 2022-10-29 DIAGNOSIS — W19XXXA Unspecified fall, initial encounter: Secondary | ICD-10-CM | POA: Diagnosis not present

## 2022-10-29 DIAGNOSIS — M79641 Pain in right hand: Secondary | ICD-10-CM | POA: Diagnosis not present

## 2022-11-13 DIAGNOSIS — D869 Sarcoidosis, unspecified: Secondary | ICD-10-CM | POA: Diagnosis not present

## 2022-11-13 DIAGNOSIS — D473 Essential (hemorrhagic) thrombocythemia: Secondary | ICD-10-CM | POA: Diagnosis not present

## 2022-11-13 DIAGNOSIS — N1832 Chronic kidney disease, stage 3b: Secondary | ICD-10-CM | POA: Diagnosis not present

## 2022-11-13 DIAGNOSIS — Z7952 Long term (current) use of systemic steroids: Secondary | ICD-10-CM | POA: Diagnosis not present

## 2022-11-13 DIAGNOSIS — Z6822 Body mass index (BMI) 22.0-22.9, adult: Secondary | ICD-10-CM | POA: Diagnosis not present

## 2022-11-13 DIAGNOSIS — H348112 Central retinal vein occlusion, right eye, stable: Secondary | ICD-10-CM | POA: Diagnosis not present

## 2022-11-13 DIAGNOSIS — Z1589 Genetic susceptibility to other disease: Secondary | ICD-10-CM | POA: Diagnosis not present

## 2022-11-13 DIAGNOSIS — M25552 Pain in left hip: Secondary | ICD-10-CM | POA: Diagnosis not present

## 2022-11-13 DIAGNOSIS — M170 Bilateral primary osteoarthritis of knee: Secondary | ICD-10-CM | POA: Diagnosis not present

## 2022-11-13 DIAGNOSIS — I7300 Raynaud's syndrome without gangrene: Secondary | ICD-10-CM | POA: Diagnosis not present

## 2022-11-13 DIAGNOSIS — L52 Erythema nodosum: Secondary | ICD-10-CM | POA: Diagnosis not present

## 2022-11-21 ENCOUNTER — Other Ambulatory Visit: Payer: Self-pay | Admitting: Hematology

## 2022-12-28 ENCOUNTER — Telehealth: Payer: Self-pay | Admitting: Hematology

## 2023-01-02 ENCOUNTER — Other Ambulatory Visit: Payer: Self-pay

## 2023-01-02 DIAGNOSIS — D473 Essential (hemorrhagic) thrombocythemia: Secondary | ICD-10-CM

## 2023-01-02 DIAGNOSIS — D509 Iron deficiency anemia, unspecified: Secondary | ICD-10-CM

## 2023-01-03 ENCOUNTER — Inpatient Hospital Stay: Payer: Medicare HMO | Admitting: Hematology

## 2023-01-03 ENCOUNTER — Telehealth: Payer: Self-pay | Admitting: Hematology

## 2023-01-03 ENCOUNTER — Inpatient Hospital Stay: Payer: Medicare HMO

## 2023-01-04 ENCOUNTER — Ambulatory Visit: Payer: Medicare HMO | Admitting: Hematology

## 2023-01-04 ENCOUNTER — Other Ambulatory Visit: Payer: Medicare HMO

## 2023-01-07 DIAGNOSIS — Z0000 Encounter for general adult medical examination without abnormal findings: Secondary | ICD-10-CM | POA: Diagnosis not present

## 2023-01-09 ENCOUNTER — Other Ambulatory Visit: Payer: Self-pay | Admitting: Family Medicine

## 2023-01-09 DIAGNOSIS — M8580 Other specified disorders of bone density and structure, unspecified site: Secondary | ICD-10-CM

## 2023-01-18 ENCOUNTER — Inpatient Hospital Stay: Payer: Medicare HMO | Attending: Hematology

## 2023-01-18 ENCOUNTER — Inpatient Hospital Stay: Payer: Medicare HMO | Admitting: Hematology

## 2023-01-18 VITALS — BP 139/59 | HR 87 | Temp 97.3°F | Resp 18 | Wt 124.5 lb

## 2023-01-18 DIAGNOSIS — Z862 Personal history of diseases of the blood and blood-forming organs and certain disorders involving the immune mechanism: Secondary | ICD-10-CM | POA: Diagnosis not present

## 2023-01-18 DIAGNOSIS — D509 Iron deficiency anemia, unspecified: Secondary | ICD-10-CM

## 2023-01-18 DIAGNOSIS — Z1589 Genetic susceptibility to other disease: Secondary | ICD-10-CM | POA: Insufficient documentation

## 2023-01-18 DIAGNOSIS — Z79899 Other long term (current) drug therapy: Secondary | ICD-10-CM | POA: Diagnosis not present

## 2023-01-18 DIAGNOSIS — N189 Chronic kidney disease, unspecified: Secondary | ICD-10-CM | POA: Insufficient documentation

## 2023-01-18 DIAGNOSIS — Z7982 Long term (current) use of aspirin: Secondary | ICD-10-CM | POA: Insufficient documentation

## 2023-01-18 DIAGNOSIS — D471 Chronic myeloproliferative disease: Secondary | ICD-10-CM

## 2023-01-18 DIAGNOSIS — D869 Sarcoidosis, unspecified: Secondary | ICD-10-CM | POA: Diagnosis not present

## 2023-01-18 DIAGNOSIS — D473 Essential (hemorrhagic) thrombocythemia: Secondary | ICD-10-CM

## 2023-01-18 DIAGNOSIS — Z87891 Personal history of nicotine dependence: Secondary | ICD-10-CM | POA: Diagnosis not present

## 2023-01-18 LAB — CBC WITH DIFFERENTIAL (CANCER CENTER ONLY)
Abs Immature Granulocytes: 0.03 10*3/uL (ref 0.00–0.07)
Basophils Absolute: 0 10*3/uL (ref 0.0–0.1)
Basophils Relative: 1 %
Eosinophils Absolute: 0.1 10*3/uL (ref 0.0–0.5)
Eosinophils Relative: 1 %
HCT: 29.6 % — ABNORMAL LOW (ref 36.0–46.0)
Hemoglobin: 9.7 g/dL — ABNORMAL LOW (ref 12.0–15.0)
Immature Granulocytes: 0 %
Lymphocytes Relative: 11 %
Lymphs Abs: 0.9 10*3/uL (ref 0.7–4.0)
MCH: 33.2 pg (ref 26.0–34.0)
MCHC: 32.8 g/dL (ref 30.0–36.0)
MCV: 101.4 fL — ABNORMAL HIGH (ref 80.0–100.0)
Monocytes Absolute: 0.6 10*3/uL (ref 0.1–1.0)
Monocytes Relative: 7 %
Neutro Abs: 6.8 10*3/uL (ref 1.7–7.7)
Neutrophils Relative %: 80 %
Platelet Count: 358 10*3/uL (ref 150–400)
RBC: 2.92 MIL/uL — ABNORMAL LOW (ref 3.87–5.11)
RDW: 14.5 % (ref 11.5–15.5)
WBC Count: 8.5 10*3/uL (ref 4.0–10.5)
nRBC: 0 % (ref 0.0–0.2)

## 2023-01-18 LAB — CMP (CANCER CENTER ONLY)
ALT: 9 U/L (ref 0–44)
AST: 14 U/L — ABNORMAL LOW (ref 15–41)
Albumin: 4.1 g/dL (ref 3.5–5.0)
Alkaline Phosphatase: 102 U/L (ref 38–126)
Anion gap: 8 (ref 5–15)
BUN: 27 mg/dL — ABNORMAL HIGH (ref 8–23)
CO2: 26 mmol/L (ref 22–32)
Calcium: 8.7 mg/dL — ABNORMAL LOW (ref 8.9–10.3)
Chloride: 107 mmol/L (ref 98–111)
Creatinine: 1.83 mg/dL — ABNORMAL HIGH (ref 0.44–1.00)
GFR, Estimated: 28 mL/min — ABNORMAL LOW (ref >60)
Glucose, Bld: 110 mg/dL — ABNORMAL HIGH (ref 70–99)
Potassium: 4 mmol/L (ref 3.5–5.1)
Sodium: 141 mmol/L (ref 135–145)
Total Bilirubin: 0.3 mg/dL (ref 0.3–1.2)
Total Protein: 6.6 g/dL (ref 6.5–8.1)

## 2023-01-18 LAB — IRON AND IRON BINDING CAPACITY (CC-WL,HP ONLY)
Iron: 45 ug/dL (ref 28–170)
Saturation Ratios: 16 % (ref 10.4–31.8)
TIBC: 283 ug/dL (ref 250–450)
UIBC: 238 ug/dL (ref 148–442)

## 2023-01-18 LAB — FERRITIN: Ferritin: 94 ng/mL (ref 11–307)

## 2023-01-18 NOTE — Progress Notes (Signed)
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 Patient was last seen by me on 09/05/2022 and was doing well overall with no new medical concerns.  Today, she reports that she is feeling well overall since her last visit. She takes iron polysaccharide daily and tolerates it well with no toxicity issues.  Patient takes Hydroxyurea regularly 4 days a week. She continues to stay well hydrated. Patient continues to take Prednisone for sarcoidosis. She reports bumps in her bilateral elbows and face and denies any viral infections.   MEDICAL HISTORY:  Past Medical History:  Diagnosis Date   Arthritis    Hyperlipidemia    Sarcoidosis     SURGICAL HISTORY: Past Surgical History:  Procedure Laterality Date   ABDOMINAL HYSTERECTOMY     BREAST BIOPSY     BREAST SURGERY     REDUCTION MAMMAPLASTY      SOCIAL HISTORY: Social History   Socioeconomic History   Marital status: Widowed    Spouse name: Not on file   Number of children: Not on file   Years of education: Not on file   Highest education level: Not on file  Occupational History   Not on file  Tobacco Use   Smoking status: Former    Current packs/day: 0.00    Average packs/day: 2.0 packs/day for 45.0 years (90.0 ttl pk-yrs)    Types: Cigarettes    Start date: 10/15/1962    Quit date: 10/15/2007    Years since quitting: 15.2   Smokeless tobacco: Former  Building services engineer status: Never Used  Substance and Sexual Activity   Alcohol use: Yes   Drug use: No   Sexual  activity: Not on file  Other Topics Concern   Not on file  Social History Narrative   Not on file   Social Determinants of Health   Financial Resource Strain: Not on file  Food Insecurity: Not on file  Transportation Needs: Not on file  Physical Activity: Not on file  Stress: Not on file  Social Connections: Not on file  Intimate Partner Violence: Not on file    FAMILY HISTORY: Family History  Problem Relation Age of Onset   Heart disease Father    Heart disease Brother     ALLERGIES:  is allergic to penicillins, codeine, and pentazocine.  MEDICATIONS:  Current Outpatient Medications  Medication Sig Dispense Refill   acetaminophen (TYLENOL) 325 MG tablet Take 1 tablet (325 mg total) by mouth every 6 (six) hours. (Patient taking differently: Take 325 mg by mouth every 6 (six) hours as needed for headache.) 30 tablet 0   amLODipine (NORVASC) 5 MG tablet Take 5 mg by mouth every evening.      aspirin EC 81 MG tablet Take 81 mg by mouth daily.     atorvastatin (LIPITOR) 40 MG tablet Take 40 mg by mouth every evening.      D3-1000 25 MCG (1000 UT) capsule TAKE TWO CAPSULES BY MOUTH ONCE DAILY 60 capsule 5   famotidine (PEPCID) 20 MG tablet One after supper 30 tablet 11  folic acid (FOLVITE) 1 MG tablet TAKE ONE TABLET BY MOUTH ONCE DAILY 30 tablet 5   hydroxyurea (HYDREA) 500 MG capsule TAKE ONE CAPSULE BY MOUTH ON DAYS MONDAY-THURSDAY. DO not take friday, saturday, OR sunday 30 capsule 3   iron polysaccharides (NIFEREX) 150 MG capsule TAKE ONE CAPSULE BY MOUTH ONCE DAILY 30 capsule 2   levothyroxine (SYNTHROID) 50 MCG tablet Take 50 mcg by mouth every morning.     Melatonin 5 MG TABS Take 5 mg by mouth at bedtime.     omeprazole (PRILOSEC) 40 MG capsule Take 40 mg by mouth every evening.      potassium chloride SA (KLOR-CON M) 20 MEQ tablet TAKE ONE TABLET BY MOUTH ONCE daily 30 tablet 1   predniSONE (DELTASONE) 5 MG tablet Take 5 mg by mouth daily.     venlafaxine XR  (EFFEXOR-XR) 150 MG 24 hr capsule Take 150 mg by mouth every evening.      No current facility-administered medications for this visit.    REVIEW OF SYSTEMS:    10 Point review of Systems was done is negative except as noted above.   PHYSICAL EXAMINATION: ECOG FS:2 - Symptomatic, <50% confined to bed  Vitals:   01/18/23 1404  BP: (!) 139/59  Pulse: 87  Resp: 18  Temp: (!) 97.3 F (36.3 C)  SpO2: 96%   Wt Readings from Last 3 Encounters:  09/05/22 123 lb 6.4 oz (56 kg)  05/07/22 121 lb 1.6 oz (54.9 kg)  02/02/22 124 lb 3.2 oz (56.3 kg)  Body mass index is 25.15 kg/m.   GENERAL:alert, in no acute distress and comfortable SKIN: no acute rashes, no significant lesions EYES: conjunctiva are pink and non-injected, sclera anicteric OROPHARYNX: MMM, no exudates, no oropharyngeal erythema or ulceration NECK: supple, no JVD LYMPH:  no palpable lymphadenopathy in the cervical, axillary or inguinal regions LUNGS: clear to auscultation b/l with normal respiratory effort HEART: regular rate & rhythm ABDOMEN:  normoactive bowel sounds , non tender, not distended. Extremity: no pedal edema PSYCH: alert & oriented x 3 with fluent speech NEURO: no focal motor/sensory deficits   LABORATORY DATA:  I have reviewed the data as listed     Latest Ref Rng & Units 01/18/2023    1:16 PM 09/05/2022   11:49 AM 05/07/2022    1:18 PM  CBC  WBC 4.0 - 10.5 K/uL 8.5  11.9  6.9   Hemoglobin 12.0 - 15.0 g/dL 9.7  40.9  81.1   Hematocrit 36.0 - 46.0 % 29.6  33.6  31.3   Platelets 150 - 400 K/uL 358  500  388     .    Latest Ref Rng & Units 01/18/2023    1:16 PM 09/05/2022   11:49 AM 05/07/2022    1:18 PM  CMP  Glucose 70 - 99 mg/dL 914  81  93   BUN 8 - 23 mg/dL 27  21  25    Creatinine 0.44 - 1.00 mg/dL 7.82  9.56  2.13   Sodium 135 - 145 mmol/L 141  138  138   Potassium 3.5 - 5.1 mmol/L 4.0  3.7  3.9   Chloride 98 - 111 mmol/L 107  104  105   CO2 22 - 32 mmol/L 26  25  27    Calcium 8.9 -  10.3 mg/dL 8.7  8.6  8.9   Total Protein 6.5 - 8.1 g/dL 6.6  6.5  6.5   Total Bilirubin 0.3 - 1.2 mg/dL 0.3  0.3  0.4   Alkaline Phos 38 - 126 U/L 102  92  77   AST 15 - 41 U/L 14  15  15    ALT 0 - 44 U/L 9  10  11     . Lab Results  Component Value Date   IRON 45 01/18/2023   TIBC 283 01/18/2023   IRONPCTSAT 16 01/18/2023   (Iron and TIBC)  Lab Results  Component Value Date   FERRITIN 94 01/18/2023           RADIOGRAPHIC STUDIES: I have personally reviewed the radiological images as listed and agreed with the findings in the report. No results found.  ASSESSMENT & PLAN:   77 y.o. female with  1.  Multisystem sarcoidosis -follows with rheumatology Dr. Dierdre Forth Presented with Generalized lymphadenopathy with multiple pulmonary nodules CT Chest 09/19/2017: Moderate mediastinal and right supraclavicular lymphadenopathy, increased. New mild bilateral axillary and bilateral hilar lymphadenopathy. Findings are worrisome for a malignant process such as a lymphoproliferative disorder or metastatic nodal disease. The right supraclavicular node may be (but not definitely) accessible for percutaneous biopsy. 2. Numerous similar-appearing subsolid spiculated pulmonary nodules scattered throughout both lungs, upper lobe predominant, largest 1.6 cm, stable to mildly increased in size and slightly increased in density in the interval. These are indeterminate, with multifocal lung adenocarcinoma on the differential.  12/26/17 CT C/A/P revealed interval response to steroids in LNadenopathy   07/17/18 CT C/A/P revealed Continue improvement in mediastinal adenopathy. Lymph nodes are now not pathologic by size criteria. 2. Continued improvement in nodular and ground-glass densities particularly in the RIGHT upper lobe. 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  2. Breast - lesion - Bx -- granulomatous inflammation -consistent with sarcoidosis.  3. Skin rash on nose - Bx  consistent with sarcoidosis. PLAN -Continue follow-up with Dr. Dierdre Forth. -Patient is currently on prednisone 5 mg p.o. daily and leflunomide.  4. H/o  Iron deficiency - no significant anemia . Lab Results  Component Value Date   IRON 45 01/18/2023   TIBC 283 01/18/2023   IRONPCTSAT 16 01/18/2023   (Iron and TIBC)  Lab Results  Component Value Date   FERRITIN 94 01/18/2023   5.  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 CBC showed WBC of 8.5K, hemoglobin of 9.7, and platelets normal at 358K. -mild anemia -informed patient that anemia may be due to factors such as sarcoidosis, hydroxyurea, or iron deficiency -ferritin 94, improvioing goal ferritin>200 in context of CKD -iron saturation 16%. Discussed goal of 20-30% -continue oral iron. Discussed goal to keep iron level high in context of CKD -no indication for IV iron at this time -educated patient of JAK-2 mutation which is a genetic change in bone marrow that causes bone marrow to produce more cells, particularly platelets in patient's case -continue iron polysaccharide once daily -Continue low-dose prednisone for sarcoidosis. -would recommend skin biopsy of bumps for further evaluation due to history of sarcoidosis -patient did have some skin involvement from sarcoid previously, skin biopsy on nose previously showed sarcoid.  -will cut dose of 500 mg Hydroxyurea to 3  days a week: Monday Wednesday, Friday to balance anemia and platelet levels -recommend one shot-glass of orange juice to aid in  iron absorption -No symptoms suggestive of abnormal bleeding or bruising.  No symptoms suggestive of VTE. -Continue aspirin 81 mg p.o. daily -Continue vitamin B complex and folic acid daily -will continue to monitor with labs in 4 months  FOLLOW UP: RTC with Dr Candise Che with labs in 4 months  The total time spent in the appointment was 25 minutes* .  All of the patient's questions were answered with apparent satisfaction. The patient knows to call the clinic with any problems, questions or concerns.   Wyvonnia Lora MD MS AAHIVMS [facility] Hematology/Oncology Physician [facility]  .*Total Encounter Time as defined by the Centers for Medicare and Medicaid Services includes, in addition to the face-to-face time of a patient visit (documented in the note above) non-face-to-face time: obtaining and reviewing outside history, ordering and reviewing medications, tests or procedures, care coordination (communications with other health care professionals or caregivers) and documentation in the medical record.    I,Mitra Faeizi,acting as a Neurosurgeon for Wyvonnia Lora, MD.,have documented all relevant documentation on the behalf of Wyvonnia Lora, MD,as directed by  Wyvonnia Lora, MD while in the presence of Wyvonnia Lora, MD.  .I have reviewed the above documentation for accuracy and completeness, and I agree with the above.  Johney Maine MD

## 2023-01-21 ENCOUNTER — Telehealth: Payer: Self-pay | Admitting: Hematology

## 2023-01-21 NOTE — Telephone Encounter (Signed)
Patient is aware of upcoming appointment times/dates.  

## 2023-01-24 ENCOUNTER — Encounter: Payer: Self-pay | Admitting: Hematology

## 2023-01-30 DIAGNOSIS — I10 Essential (primary) hypertension: Secondary | ICD-10-CM | POA: Diagnosis not present

## 2023-01-30 DIAGNOSIS — R413 Other amnesia: Secondary | ICD-10-CM | POA: Diagnosis not present

## 2023-01-30 DIAGNOSIS — N184 Chronic kidney disease, stage 4 (severe): Secondary | ICD-10-CM | POA: Diagnosis not present

## 2023-01-30 DIAGNOSIS — I739 Peripheral vascular disease, unspecified: Secondary | ICD-10-CM | POA: Diagnosis not present

## 2023-01-30 DIAGNOSIS — E039 Hypothyroidism, unspecified: Secondary | ICD-10-CM | POA: Diagnosis not present

## 2023-01-30 DIAGNOSIS — F324 Major depressive disorder, single episode, in partial remission: Secondary | ICD-10-CM | POA: Diagnosis not present

## 2023-01-30 DIAGNOSIS — Z6825 Body mass index (BMI) 25.0-25.9, adult: Secondary | ICD-10-CM | POA: Diagnosis not present

## 2023-01-30 DIAGNOSIS — D84821 Immunodeficiency due to drugs: Secondary | ICD-10-CM | POA: Diagnosis not present

## 2023-01-30 DIAGNOSIS — Z0000 Encounter for general adult medical examination without abnormal findings: Secondary | ICD-10-CM | POA: Diagnosis not present

## 2023-01-30 DIAGNOSIS — E7800 Pure hypercholesterolemia, unspecified: Secondary | ICD-10-CM | POA: Diagnosis not present

## 2023-01-30 DIAGNOSIS — K219 Gastro-esophageal reflux disease without esophagitis: Secondary | ICD-10-CM | POA: Diagnosis not present

## 2023-01-30 DIAGNOSIS — J449 Chronic obstructive pulmonary disease, unspecified: Secondary | ICD-10-CM | POA: Diagnosis not present

## 2023-02-06 ENCOUNTER — Emergency Department (HOSPITAL_BASED_OUTPATIENT_CLINIC_OR_DEPARTMENT_OTHER): Payer: Medicare HMO

## 2023-02-06 ENCOUNTER — Encounter (HOSPITAL_BASED_OUTPATIENT_CLINIC_OR_DEPARTMENT_OTHER): Payer: Self-pay | Admitting: Emergency Medicine

## 2023-02-06 ENCOUNTER — Other Ambulatory Visit: Payer: Self-pay

## 2023-02-06 ENCOUNTER — Other Ambulatory Visit (HOSPITAL_BASED_OUTPATIENT_CLINIC_OR_DEPARTMENT_OTHER): Payer: Self-pay

## 2023-02-06 ENCOUNTER — Emergency Department (HOSPITAL_BASED_OUTPATIENT_CLINIC_OR_DEPARTMENT_OTHER)
Admission: EM | Admit: 2023-02-06 | Discharge: 2023-02-06 | Payer: Medicare HMO | Attending: Emergency Medicine | Admitting: Emergency Medicine

## 2023-02-06 ENCOUNTER — Encounter: Payer: Self-pay | Admitting: Hematology

## 2023-02-06 DIAGNOSIS — H538 Other visual disturbances: Secondary | ICD-10-CM | POA: Diagnosis not present

## 2023-02-06 DIAGNOSIS — Z79899 Other long term (current) drug therapy: Secondary | ICD-10-CM | POA: Insufficient documentation

## 2023-02-06 DIAGNOSIS — Y92002 Bathroom of unspecified non-institutional (private) residence single-family (private) house as the place of occurrence of the external cause: Secondary | ICD-10-CM | POA: Diagnosis not present

## 2023-02-06 DIAGNOSIS — S0240DA Maxillary fracture, left side, initial encounter for closed fracture: Secondary | ICD-10-CM | POA: Insufficient documentation

## 2023-02-06 DIAGNOSIS — D649 Anemia, unspecified: Secondary | ICD-10-CM | POA: Diagnosis not present

## 2023-02-06 DIAGNOSIS — S199XXA Unspecified injury of neck, initial encounter: Secondary | ICD-10-CM | POA: Diagnosis not present

## 2023-02-06 DIAGNOSIS — R112 Nausea with vomiting, unspecified: Secondary | ICD-10-CM | POA: Diagnosis not present

## 2023-02-06 DIAGNOSIS — D72829 Elevated white blood cell count, unspecified: Secondary | ICD-10-CM | POA: Insufficient documentation

## 2023-02-06 DIAGNOSIS — Z7982 Long term (current) use of aspirin: Secondary | ICD-10-CM | POA: Insufficient documentation

## 2023-02-06 DIAGNOSIS — H1132 Conjunctival hemorrhage, left eye: Secondary | ICD-10-CM | POA: Insufficient documentation

## 2023-02-06 DIAGNOSIS — W228XXA Striking against or struck by other objects, initial encounter: Secondary | ICD-10-CM | POA: Diagnosis not present

## 2023-02-06 DIAGNOSIS — W19XXXA Unspecified fall, initial encounter: Secondary | ICD-10-CM | POA: Diagnosis not present

## 2023-02-06 DIAGNOSIS — S0992XA Unspecified injury of nose, initial encounter: Secondary | ICD-10-CM | POA: Diagnosis not present

## 2023-02-06 DIAGNOSIS — W1830XA Fall on same level, unspecified, initial encounter: Secondary | ICD-10-CM | POA: Diagnosis not present

## 2023-02-06 DIAGNOSIS — S0232XA Fracture of orbital floor, left side, initial encounter for closed fracture: Secondary | ICD-10-CM | POA: Diagnosis not present

## 2023-02-06 DIAGNOSIS — S0592XA Unspecified injury of left eye and orbit, initial encounter: Secondary | ICD-10-CM | POA: Diagnosis not present

## 2023-02-06 DIAGNOSIS — S51811A Laceration without foreign body of right forearm, initial encounter: Secondary | ICD-10-CM | POA: Diagnosis not present

## 2023-02-06 DIAGNOSIS — H5712 Ocular pain, left eye: Secondary | ICD-10-CM | POA: Diagnosis not present

## 2023-02-06 DIAGNOSIS — S0285XA Fracture of orbit, unspecified, initial encounter for closed fracture: Secondary | ICD-10-CM

## 2023-02-06 DIAGNOSIS — I6782 Cerebral ischemia: Secondary | ICD-10-CM | POA: Diagnosis not present

## 2023-02-06 LAB — CBC WITH DIFFERENTIAL/PLATELET
Abs Immature Granulocytes: 0.07 10*3/uL (ref 0.00–0.07)
Basophils Absolute: 0.1 10*3/uL (ref 0.0–0.1)
Basophils Relative: 1 %
Eosinophils Absolute: 0 10*3/uL (ref 0.0–0.5)
Eosinophils Relative: 0 %
HCT: 33.9 % — ABNORMAL LOW (ref 36.0–46.0)
Hemoglobin: 11.1 g/dL — ABNORMAL LOW (ref 12.0–15.0)
Immature Granulocytes: 1 %
Lymphocytes Relative: 9 %
Lymphs Abs: 1.1 10*3/uL (ref 0.7–4.0)
MCH: 31.7 pg (ref 26.0–34.0)
MCHC: 32.7 g/dL (ref 30.0–36.0)
MCV: 96.9 fL (ref 80.0–100.0)
Monocytes Absolute: 0.9 10*3/uL (ref 0.1–1.0)
Monocytes Relative: 8 %
Neutro Abs: 9.5 10*3/uL — ABNORMAL HIGH (ref 1.7–7.7)
Neutrophils Relative %: 81 %
Platelets: 475 10*3/uL — ABNORMAL HIGH (ref 150–400)
RBC: 3.5 MIL/uL — ABNORMAL LOW (ref 3.87–5.11)
RDW: 14.4 % (ref 11.5–15.5)
WBC: 11.5 10*3/uL — ABNORMAL HIGH (ref 4.0–10.5)
nRBC: 0 % (ref 0.0–0.2)

## 2023-02-06 LAB — BASIC METABOLIC PANEL
Anion gap: 12 (ref 5–15)
BUN: 24 mg/dL — ABNORMAL HIGH (ref 8–23)
CO2: 24 mmol/L (ref 22–32)
Calcium: 9.8 mg/dL (ref 8.9–10.3)
Chloride: 103 mmol/L (ref 98–111)
Creatinine, Ser: 1.45 mg/dL — ABNORMAL HIGH (ref 0.44–1.00)
GFR, Estimated: 37 mL/min — ABNORMAL LOW (ref >60)
Glucose, Bld: 102 mg/dL — ABNORMAL HIGH (ref 70–99)
Potassium: 4 mmol/L (ref 3.5–5.1)
Sodium: 139 mmol/L (ref 135–145)

## 2023-02-06 MED ORDER — TETRACAINE HCL 0.5 % OP SOLN
1.0000 [drp] | Freq: Once | OPHTHALMIC | Status: AC
  Administered 2023-02-06: 1 [drp] via OPHTHALMIC
  Filled 2023-02-06: qty 4

## 2023-02-06 MED ORDER — FLUORESCEIN SODIUM 1 MG OP STRP
1.0000 | ORAL_STRIP | Freq: Once | OPHTHALMIC | Status: AC
  Administered 2023-02-06: 1 via OPHTHALMIC
  Filled 2023-02-06: qty 1

## 2023-02-06 NOTE — ED Notes (Signed)
Mepitel/vaseline gauze and nonadherent dressing applied to right posterior lateral forearm skin tear, red /pink wound bed.

## 2023-02-06 NOTE — ED Triage Notes (Signed)
Pt arrives to ED with c/o fall this morning in the bathroom. Pt notes she doesn't know how she feel. Pt notes hitting a table and injuring her head, face, right arm. She is unsure of LOC.

## 2023-02-06 NOTE — ED Notes (Signed)
Patient admitted to [facility]. Accepted by Dr. Youlanda Roys. She will be transported by POV/family member.-ABB(NS)

## 2023-02-06 NOTE — ED Provider Notes (Signed)
[place] EMERGENCY DEPARTMENT AT [facility] Provider Note   CSN: [id number] Arrival date & time: 02/06/23  1210     History  Chief Complaint  Patient presents with   Whitnie Moots is a 77 y.o. female.   Fall  77 year old female history of arthritis, hyperlipidemia, sarcoidosis presenting for fall.  Patient states today she was going to the bathroom when it was dark.  She think she lost her balance and then fell.  She did not have any dizziness, presyncope or syncope.  She did not lose conscious.  She had the left side of her face and her right arm.  She reports mild left-sided headache and left eye blurry vision.  She does not have any neck pain, nausea, vomiting.  No abdominal pain or chest pain or back pain.  She has no pain to her right arm but does have an abrasion here.     Home Medications Prior to Admission medications   Medication Sig Start Date End Date Taking? Authorizing Provider  acetaminophen (TYLENOL) 325 MG tablet Take 1 tablet (325 mg total) by mouth every 6 (six) hours. Patient taking differently: Take 325 mg by mouth every 6 (six) hours as needed for headache. 03/10/17   Caccavale, Sophia, PA-C  amLODipine (NORVASC) 5 MG tablet Take 5 mg by mouth every evening.  08/27/16   [provider]  aspirin EC 81 MG tablet Take 81 mg by mouth daily.    [provider]  atorvastatin (LIPITOR) 40 MG tablet Take 40 mg by mouth every evening.  08/27/16   [provider]  carvedilol (COREG) 3.125 MG tablet Take 3.125 mg by mouth 2 (two) times daily with a meal.    [provider]  D3-1000 25 MCG (1000 UT) capsule TAKE TWO CAPSULES BY MOUTH ONCE DAILY 11/21/22   Johney Maine, MD  famotidine (PEPCID) 20 MG tablet One after supper 11/28/20   Nyoka Cowden, MD  folic acid (FOLVITE) 1 MG tablet TAKE ONE TABLET BY MOUTH ONCE DAILY 11/21/22   Johney Maine, MD  hydroxyurea (HYDREA) 500 MG capsule TAKE ONE CAPSULE BY  MOUTH ON DAYS MONDAY-THURSDAY. DO not take friday, saturday, OR sunday 05/25/22   Johney Maine, MD  iron polysaccharides (NIFEREX) 150 MG capsule TAKE ONE CAPSULE BY MOUTH ONCE DAILY 08/15/22   Johney Maine, MD  levothyroxine (SYNTHROID) 50 MCG tablet Take 50 mcg by mouth every morning. 12/14/19   [provider]  Melatonin 5 MG TABS Take 5 mg by mouth at bedtime.    [provider]  omeprazole (PRILOSEC) 40 MG capsule Take 40 mg by mouth every evening.     [provider]  potassium chloride SA (KLOR-CON M) 20 MEQ tablet TAKE ONE TABLET BY MOUTH ONCE daily 09/27/21   Johney Maine, MD  predniSONE (DELTASONE) 5 MG tablet Take 5 mg by mouth daily.    [provider]  venlafaxine XR (EFFEXOR-XR) 150 MG 24 hr capsule Take 150 mg by mouth every evening.  08/27/16   [provider]      Allergies    Penicillins, Codeine, and Pentazocine    Review of Systems   Review of Systems Review of systems completed and notable as per HPI.  ROS otherwise negative.   Physical Exam Updated Vital Signs BP (!) 178/76   Pulse 77   Temp 97.6 F (36.4 C) (Temporal)   Resp 18   Ht 4\' 11"  (1.499  m)   Wt 54.2 kg   SpO2 94%   BMI 24.12 kg/m  Physical Exam Vitals and nursing note reviewed.  Constitutional:      General: She is not in acute distress.    Appearance: She is well-developed.  HENT:     Head: Normocephalic and atraumatic.     Nose: Nose normal.     Mouth/Throat:     Mouth: Mucous membranes are moist.     Pharynx: Oropharynx is clear.  Eyes:     General: Lids are normal. Lids are everted, no foreign bodies appreciated.     Intraocular pressure: Right eye pressure is 14 mmHg. Left eye pressure is 22 mmHg. Measurements were taken using a handheld tonometer.    Extraocular Movements: Extraocular movements intact.     Pupils: Pupils are equal, round, and reactive to light.     Comments: Circumferential subconjunctival hemorrhage of  the left eye.  No fluorescein uptake on staining.  She has limited range of motion of the eye and significant pain with extraocular movements.  Cardiovascular:     Rate and Rhythm: Normal rate and regular rhythm.     Heart sounds: No murmur heard. Pulmonary:     Effort: Pulmonary effort is normal. No respiratory distress.     Breath sounds: Normal breath sounds.  Abdominal:     Palpations: Abdomen is soft.     Tenderness: There is no abdominal tenderness.  Musculoskeletal:        General: No swelling.     Cervical back: Neck supple.  Skin:    General: Skin is warm and dry.     Capillary Refill: Capillary refill takes less than 2 seconds.  Neurological:     General: No focal deficit present.     Mental Status: She is alert and oriented to person, place, and time. Mental status is at baseline.     Cranial Nerves: No cranial nerve deficit.     Sensory: No sensory deficit.     Motor: No weakness.  Psychiatric:        Mood and Affect: Mood normal.     ED Results / Procedures / Treatments   Labs (all labs ordered are listed, but only abnormal results are displayed) Labs Reviewed  CBC WITH DIFFERENTIAL/PLATELET - Abnormal; Notable for the following components:      Result Value   WBC 11.5 (*)    RBC 3.50 (*)    Hemoglobin 11.1 (*)    HCT 33.9 (*)    Platelets 475 (*)    Neutro Abs 9.5 (*)    All other components within normal limits  BASIC METABOLIC PANEL    EKG None  Radiology CT Head Wo Contrast  Result Date: 02/06/2023 CLINICAL DATA:  Neck trauma (Age >= 65y); Facial trauma, blunt; Head trauma, minor (Age >= 65y) EXAM: CT HEAD WITHOUT CONTRAST CT MAXILLOFACIAL WITHOUT CONTRAST CT CERVICAL SPINE WITHOUT CONTRAST TECHNIQUE: Multidetector CT imaging of the head, cervical spine, and maxillofacial structures were performed using the standard protocol without intravenous contrast. Multiplanar CT image reconstructions of the cervical spine and maxillofacial structures were also  generated. RADIATION DOSE REDUCTION: This exam was performed according to the departmental dose-optimization program which includes automated exposure control, adjustment of the mA and/or kV according to patient size and/or use of iterative reconstruction technique. COMPARISON:  None Available. FINDINGS: CT HEAD FINDINGS Brain: No evidence of acute infarction, hemorrhage, hydrocephalus, extra-axial collection or mass lesion/mass effect. 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 Vascular: No hyperdense vessel or unexpected calcification. Skull: Normal. Negative for fracture or focal lesion. Other: None. CT MAXILLOFACIAL FINDINGS Osseous: There is a comminuted fracture of the orbital floor with inferior herniation of the orbital fat. The inferior rectus muscle body likely also at least partially extends through the fracture site (series 8, image 49). There is a mildly displaced fracture of the lateral wall of the left maxillary sinus. Orbits: There is air and blood products within the intraconal compartment of the left orbit (series 8, image 51, 52). Air extends posteriorly to the level of the orbital apex. Sinuses: No middle ear or mastoid effusion. Posttraumatic changes surrounding left maxillary sinus with near-complete opacification of the left maxillary sinus. Soft tissues: Soft tissue swelling surrounding the left orbit. CT CERVICAL SPINE FINDINGS Alignment: Grade 1 anterolisthesis of C2 on C3 and C3 on C4. Skull base and vertebrae: No acute fracture. No primary bone lesion or focal pathologic process. Multilevel degenerative endplate changes with bone-on-bone articulation at C4-C5 and C5-C6. Soft tissues and spinal canal: No prevertebral fluid or swelling. No visible canal hematoma. Disc levels:  No CT evidence of high-grade spinal canal stenosis. Upper chest: Negative. Other: None IMPRESSION: 1. No CT  evidence of intracranial injury. 2. Comminuted fracture of the left orbital floor with inferior herniation of the orbital fat. The inferior rectus muscle body likely also at least partially extends through the fracture site. Correlate clinically for signs of entrapment. 3. Mildly displaced fracture of the lateral wall of the left maxillary sinus. 4. Air and blood products within the intraconal compartment of the left orbit. Air extends posteriorly to the level of the orbital apex. Note that this puts the patient at risk for orbital compartment syndrome. Recommend ophthalmologic consultation. 5. No acute fracture or traumatic subluxation of the cervical spine. Electronically Signed   By: Lorenza Cambridge M.D.   On: 02/06/2023 13:37   CT Cervical Spine Wo Contrast  Result Date: 02/06/2023 CLINICAL DATA:  Neck trauma (Age >= 65y); Facial trauma, blunt; Head trauma, minor (Age >= 65y) EXAM: CT HEAD WITHOUT CONTRAST CT MAXILLOFACIAL WITHOUT CONTRAST CT CERVICAL SPINE WITHOUT CONTRAST TECHNIQUE: Multidetector CT imaging of the head, cervical spine, and maxillofacial structures were performed using the standard protocol without intravenous contrast. Multiplanar CT image reconstructions of the cervical spine and maxillofacial structures were also generated. RADIATION DOSE REDUCTION: This exam was performed according to the departmental dose-optimization program which includes automated exposure control, adjustment of the mA and/or kV according to patient size and/or use of iterative reconstruction technique. COMPARISON:  None Available. FINDINGS: CT HEAD FINDINGS Brain: No evidence of acute infarction, hemorrhage, hydrocephalus, extra-axial collection or mass lesion/mass effect. 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 Vascular: No hyperdense vessel or unexpected calcification. Skull: Normal. Negative for fracture  or focal lesion. Other: None. CT MAXILLOFACIAL FINDINGS Osseous: There is a comminuted fracture of the orbital floor with inferior herniation of the orbital fat. The inferior rectus muscle body likely also at least partially extends through the fracture site (series 8, image 49). There is a mildly displaced fracture of the lateral wall of the left maxillary sinus. Orbits: There is air and blood products within the intraconal compartment of the left orbit (series 8, image 51, 52). Air extends posteriorly to the level of the orbital apex. Sinuses: No middle  ear or mastoid effusion. Posttraumatic changes surrounding left maxillary sinus with near-complete opacification of the left maxillary sinus. Soft tissues: Soft tissue swelling surrounding the left orbit. CT CERVICAL SPINE FINDINGS Alignment: Grade 1 anterolisthesis of C2 on C3 and C3 on C4. Skull base and vertebrae: No acute fracture. No primary bone lesion or focal pathologic process. Multilevel degenerative endplate changes with bone-on-bone articulation at C4-C5 and C5-C6. Soft tissues and spinal canal: No prevertebral fluid or swelling. No visible canal hematoma. Disc levels:  No CT evidence of high-grade spinal canal stenosis. Upper chest: Negative. Other: None IMPRESSION: 1. No CT evidence of intracranial injury. 2. Comminuted fracture of the left orbital floor with inferior herniation of the orbital fat. The inferior rectus muscle body likely also at least partially extends through the fracture site. Correlate clinically for signs of entrapment. 3. Mildly displaced fracture of the lateral wall of the left maxillary sinus. 4. Air and blood products within the intraconal compartment of the left orbit. Air extends posteriorly to the level of the orbital apex. Note that this puts the patient at risk for orbital compartment syndrome. Recommend ophthalmologic consultation. 5. No acute fracture or traumatic subluxation of the cervical spine. Electronically Signed    By: Lorenza Cambridge M.D.   On: 02/06/2023 13:37   CT Maxillofacial Wo Contrast  Result Date: 02/06/2023 CLINICAL DATA:  Neck trauma (Age >= 65y); Facial trauma, blunt; Head trauma, minor (Age >= 65y) EXAM: CT HEAD WITHOUT CONTRAST CT MAXILLOFACIAL WITHOUT CONTRAST CT CERVICAL SPINE WITHOUT CONTRAST TECHNIQUE: Multidetector CT imaging of the head, cervical spine, and maxillofacial structures were performed using the standard protocol without intravenous contrast. Multiplanar CT image reconstructions of the cervical spine and maxillofacial structures were also generated. RADIATION DOSE REDUCTION: This exam was performed according to the departmental dose-optimization program which includes automated exposure control, adjustment of the mA and/or kV according to patient size and/or use of iterative reconstruction technique. COMPARISON:  None Available. FINDINGS: CT HEAD FINDINGS Brain: No evidence of acute infarction, hemorrhage, hydrocephalus, extra-axial collection or mass lesion/mass effect. 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 Vascular: No hyperdense vessel or unexpected calcification. Skull: Normal. Negative for fracture or focal lesion. Other: None. CT MAXILLOFACIAL FINDINGS Osseous: There is a comminuted fracture of the orbital floor with inferior herniation of the orbital fat. The inferior rectus muscle body likely also at least partially extends through the fracture site (series 8, image 49). There is a mildly displaced fracture of the lateral wall of the left maxillary sinus. Orbits: There is air and blood products within the intraconal compartment of the left orbit (series 8, image 51, 52). Air extends posteriorly to the level of the orbital apex. Sinuses: No middle ear or mastoid effusion. Posttraumatic changes surrounding left maxillary sinus with near-complete opacification of the left  maxillary sinus. Soft tissues: Soft tissue swelling surrounding the left orbit. CT CERVICAL SPINE FINDINGS Alignment: Grade 1 anterolisthesis of C2 on C3 and C3 on C4. Skull base and vertebrae: No acute fracture. No primary bone lesion or focal pathologic process. Multilevel degenerative endplate changes with bone-on-bone articulation at C4-C5 and C5-C6. Soft tissues and spinal canal: No prevertebral fluid or swelling. No visible canal hematoma. Disc levels:  No CT evidence of high-grade spinal canal stenosis. Upper chest: Negative. Other: None IMPRESSION: 1. No CT evidence of intracranial injury. 2. Comminuted fracture of the left orbital floor with inferior herniation of the  orbital fat. The inferior rectus muscle body likely also at least partially extends through the fracture site. Correlate clinically for signs of entrapment. 3. Mildly displaced fracture of the lateral wall of the left maxillary sinus. 4. Air and blood products within the intraconal compartment of the left orbit. Air extends posteriorly to the level of the orbital apex. Note that this puts the patient at risk for orbital compartment syndrome. Recommend ophthalmologic consultation. 5. No acute fracture or traumatic subluxation of the cervical spine. Electronically Signed   By: Lorenza Cambridge M.D.   On: 02/06/2023 13:37    Procedures Procedures    Medications Ordered in ED Medications  fluorescein ophthalmic strip 1 strip (1 strip Both Eyes Given by Other 02/06/23 1330)  tetracaine (PONTOCAINE) 0.5 % ophthalmic solution 1 drop (1 drop Both Eyes Given by Other 02/06/23 1330)    ED Course/ Medical Decision Making/ A&P Clinical Course as of 02/06/23 1614  Wed Feb 06, 2023  15[facility] ophthal[facility]: accepts to [facility] ED [JD]    Clinical Course User Index [JD] Laurence Spates, MD                                 Medical Decision Making Amount and/or Complexity of Data Reviewed Labs: ordered. Radiology:  ordered.  Risk Prescription drug management.   Medical Decision Making:   KATHARIN FULOP is a 77 y.o. female who presented to the ED today with mechanical fall.  Vital signs notable for hypertension.  On exam she has significant left-sided facial swelling and subconjunctival hemorrhage.  She also has restricted extraocular movements concerning for possible orbital fracture.  Slightly decreased visual acuity on the left.  No proptosis.  She denies any preceding presyncope or syncope.  She is not anticoagulated.  She has skin tear to the right arm, updated on tetanus and no tenderness or pain with range of motion low concern for fracture.  She did hit her head, obtain CT head, face, cervical spine.   Patient placed on continuous vitals and telemetry monitoring while in ED which was reviewed periodically.  Reviewed and confirmed nursing documentation for past medical history, family history, social history.  Initial Study Results:   Laboratory  All laboratory results reviewed.  Labs notable for mild leukocytosis, mild anemia.  Radiology:  All images reviewed independently.  Comminuted orbital floor fracture and left orbital wall fracture.  Agree with radiology report at this time.    Consults: Case discussed with Dr. Jearld Fenton, ENT.   Reassessment and Plan:   CT without intracranial or C spine injury. CT face with comminuted orbital floor fracture and entrapment.  She also has blood in the intraconal space.  R eye pressure 14, L eye pressure 22, no indication for canthotomy at this time.  No ocular pain.  Discussed with Dr. Jearld Fenton ENT who recommends transfer to [facility].  Discussed with Dr. Lowell Guitar with [facility] ophthalmology who accepts ED to ED.  Discussed transportation with patient, she would like her son to take her.  I did offer ambulance transport and discussed risks of clinical worsening including vision loss or pain which could be permanent.  She preferred to go POV which I think is reasonable  at this time.  Her son is going to take her directly to [facility] ED.  Remains stable at time of transfer.   Patient's presentation is most consistent with acute presentation with potential threat to life or  bodily function.           Final Clinical Impression(s) / ED Diagnoses Final diagnoses:  Closed fracture of orbital wall, initial encounter [facility])    Rx / DC Orders ED Discharge Orders     None         Laurence Spates, MD 02/06/23 1614

## 2023-02-06 NOTE — ED Notes (Signed)
[facility] called for ENT and Ophthalmology Consults with Dr Davis.-ABB(NS)

## 2023-02-06 NOTE — Discharge Instructions (Signed)
Go directly to the [facility] emergency department for transfer.

## 2023-02-06 NOTE — ED Notes (Signed)
Drops and strip at bedside.

## 2023-02-07 DIAGNOSIS — S0232XA Fracture of orbital floor, left side, initial encounter for closed fracture: Secondary | ICD-10-CM | POA: Diagnosis not present

## 2023-02-21 DIAGNOSIS — S0232XD Fracture of orbital floor, left side, subsequent encounter for fracture with routine healing: Secondary | ICD-10-CM | POA: Diagnosis not present

## 2023-02-21 DIAGNOSIS — H2513 Age-related nuclear cataract, bilateral: Secondary | ICD-10-CM | POA: Diagnosis not present

## 2023-03-06 ENCOUNTER — Other Ambulatory Visit: Payer: Self-pay | Admitting: Family Medicine

## 2023-03-06 DIAGNOSIS — Z1231 Encounter for screening mammogram for malignant neoplasm of breast: Secondary | ICD-10-CM

## 2023-03-08 DIAGNOSIS — M8580 Other specified disorders of bone density and structure, unspecified site: Secondary | ICD-10-CM | POA: Diagnosis not present

## 2023-03-08 DIAGNOSIS — D860 Sarcoidosis of lung: Secondary | ICD-10-CM | POA: Diagnosis not present

## 2023-03-08 DIAGNOSIS — D485 Neoplasm of uncertain behavior of skin: Secondary | ICD-10-CM | POA: Diagnosis not present

## 2023-03-08 DIAGNOSIS — M419 Scoliosis, unspecified: Secondary | ICD-10-CM | POA: Diagnosis not present

## 2023-03-08 DIAGNOSIS — D869 Sarcoidosis, unspecified: Secondary | ICD-10-CM | POA: Diagnosis not present

## 2023-03-08 DIAGNOSIS — L720 Epidermal cyst: Secondary | ICD-10-CM | POA: Diagnosis not present

## 2023-03-08 DIAGNOSIS — L728 Other follicular cysts of the skin and subcutaneous tissue: Secondary | ICD-10-CM | POA: Diagnosis not present

## 2023-03-26 ENCOUNTER — Inpatient Hospital Stay: Admission: RE | Admit: 2023-03-26 | Payer: Medicare HMO | Source: Ambulatory Visit

## 2023-04-02 ENCOUNTER — Other Ambulatory Visit: Payer: Self-pay | Admitting: Hematology

## 2023-04-03 ENCOUNTER — Other Ambulatory Visit: Payer: Self-pay | Admitting: Hematology

## 2023-04-04 ENCOUNTER — Encounter: Payer: Self-pay | Admitting: Hematology

## 2023-04-12 ENCOUNTER — Other Ambulatory Visit: Payer: Self-pay

## 2023-04-12 ENCOUNTER — Ambulatory Visit
Admission: RE | Admit: 2023-04-12 | Discharge: 2023-04-12 | Disposition: A | Discharge status: Home / Self Care | Payer: Medicare HMO | Source: Ambulatory Visit | Attending: Family Medicine | Admitting: Family Medicine

## 2023-04-12 DIAGNOSIS — Z1231 Encounter for screening mammogram for malignant neoplasm of breast: Secondary | ICD-10-CM | POA: Diagnosis not present

## 2023-04-12 DIAGNOSIS — D473 Essential (hemorrhagic) thrombocythemia: Secondary | ICD-10-CM

## 2023-04-12 MED ORDER — HYDROXYUREA 500 MG PO CAPS
ORAL_CAPSULE | ORAL | 3 refills | Status: DC
Start: 2023-04-12 — End: 2023-09-03

## 2023-04-30 DIAGNOSIS — D869 Sarcoidosis, unspecified: Secondary | ICD-10-CM | POA: Diagnosis not present

## 2023-04-30 DIAGNOSIS — D45 Polycythemia vera: Secondary | ICD-10-CM | POA: Diagnosis not present

## 2023-04-30 DIAGNOSIS — F419 Anxiety disorder, unspecified: Secondary | ICD-10-CM | POA: Diagnosis not present

## 2023-04-30 DIAGNOSIS — N184 Chronic kidney disease, stage 4 (severe): Secondary | ICD-10-CM | POA: Diagnosis not present

## 2023-04-30 DIAGNOSIS — I739 Peripheral vascular disease, unspecified: Secondary | ICD-10-CM | POA: Diagnosis not present

## 2023-04-30 DIAGNOSIS — N1832 Chronic kidney disease, stage 3b: Secondary | ICD-10-CM | POA: Diagnosis not present

## 2023-04-30 DIAGNOSIS — I129 Hypertensive chronic kidney disease with stage 1 through stage 4 chronic kidney disease, or unspecified chronic kidney disease: Secondary | ICD-10-CM | POA: Diagnosis not present

## 2023-05-01 ENCOUNTER — Other Ambulatory Visit: Payer: Self-pay | Admitting: Nephrology

## 2023-05-01 DIAGNOSIS — N184 Chronic kidney disease, stage 4 (severe): Secondary | ICD-10-CM

## 2023-05-02 ENCOUNTER — Ambulatory Visit
Admission: RE | Admit: 2023-05-02 | Discharge: 2023-05-02 | Disposition: A | Discharge status: Home / Self Care | Payer: Medicare HMO | Source: Ambulatory Visit | Attending: Nephrology | Admitting: Nephrology

## 2023-05-02 DIAGNOSIS — N184 Chronic kidney disease, stage 4 (severe): Secondary | ICD-10-CM

## 2023-05-02 DIAGNOSIS — N189 Chronic kidney disease, unspecified: Secondary | ICD-10-CM | POA: Diagnosis not present

## 2023-05-10 ENCOUNTER — Other Ambulatory Visit: Payer: Self-pay

## 2023-05-10 DIAGNOSIS — D471 Chronic myeloproliferative disease: Secondary | ICD-10-CM

## 2023-05-13 ENCOUNTER — Inpatient Hospital Stay: Payer: Medicare HMO | Attending: Hematology

## 2023-05-13 ENCOUNTER — Inpatient Hospital Stay (HOSPITAL_BASED_OUTPATIENT_CLINIC_OR_DEPARTMENT_OTHER): Payer: Medicare HMO | Admitting: Hematology

## 2023-05-13 VITALS — BP 140/66 | HR 71 | Temp 97.9°F | Resp 16 | Wt 121.5 lb

## 2023-05-13 DIAGNOSIS — Z79899 Other long term (current) drug therapy: Secondary | ICD-10-CM | POA: Diagnosis not present

## 2023-05-13 DIAGNOSIS — D649 Anemia, unspecified: Secondary | ICD-10-CM | POA: Insufficient documentation

## 2023-05-13 DIAGNOSIS — Z7952 Long term (current) use of systemic steroids: Secondary | ICD-10-CM | POA: Insufficient documentation

## 2023-05-13 DIAGNOSIS — Z1589 Genetic susceptibility to other disease: Secondary | ICD-10-CM | POA: Diagnosis not present

## 2023-05-13 DIAGNOSIS — D471 Chronic myeloproliferative disease: Secondary | ICD-10-CM | POA: Insufficient documentation

## 2023-05-13 DIAGNOSIS — Z7982 Long term (current) use of aspirin: Secondary | ICD-10-CM | POA: Diagnosis not present

## 2023-05-13 DIAGNOSIS — Z7969 Long term (current) use of other immunomodulators and immunosuppressants: Secondary | ICD-10-CM | POA: Insufficient documentation

## 2023-05-13 DIAGNOSIS — Z87891 Personal history of nicotine dependence: Secondary | ICD-10-CM | POA: Insufficient documentation

## 2023-05-13 DIAGNOSIS — D869 Sarcoidosis, unspecified: Secondary | ICD-10-CM | POA: Diagnosis not present

## 2023-05-13 DIAGNOSIS — Z862 Personal history of diseases of the blood and blood-forming organs and certain disorders involving the immune mechanism: Secondary | ICD-10-CM | POA: Diagnosis not present

## 2023-05-13 LAB — CMP (CANCER CENTER ONLY)
ALT: 9 U/L (ref 0–44)
AST: 14 U/L — ABNORMAL LOW (ref 15–41)
Albumin: 4.3 g/dL (ref 3.5–5.0)
Alkaline Phosphatase: 88 U/L (ref 38–126)
Anion gap: 8 (ref 5–15)
BUN: 29 mg/dL — ABNORMAL HIGH (ref 8–23)
CO2: 25 mmol/L (ref 22–32)
Calcium: 9 mg/dL (ref 8.9–10.3)
Chloride: 104 mmol/L (ref 98–111)
Creatinine: 1.82 mg/dL — ABNORMAL HIGH (ref 0.44–1.00)
GFR, Estimated: 28 mL/min — ABNORMAL LOW (ref >60)
Glucose, Bld: 121 mg/dL — ABNORMAL HIGH (ref 70–99)
Potassium: 4.2 mmol/L (ref 3.5–5.1)
Sodium: 137 mmol/L (ref 135–145)
Total Bilirubin: 0.4 mg/dL (ref <1.2)
Total Protein: 6.9 g/dL (ref 6.5–8.1)

## 2023-05-13 LAB — IRON AND IRON BINDING CAPACITY (CC-WL,HP ONLY)
Iron: 45 ug/dL (ref 28–170)
Saturation Ratios: 14 % (ref 10.4–31.8)
TIBC: 318 ug/dL (ref 250–450)
UIBC: 273 ug/dL (ref 148–442)

## 2023-05-13 LAB — CBC WITH DIFFERENTIAL (CANCER CENTER ONLY)
Abs Immature Granulocytes: 0.04 10*3/uL (ref 0.00–0.07)
Basophils Absolute: 0.1 10*3/uL (ref 0.0–0.1)
Basophils Relative: 1 %
Eosinophils Absolute: 0.1 10*3/uL (ref 0.0–0.5)
Eosinophils Relative: 1 %
HCT: 30.8 % — ABNORMAL LOW (ref 36.0–46.0)
Hemoglobin: 10.1 g/dL — ABNORMAL LOW (ref 12.0–15.0)
Immature Granulocytes: 1 %
Lymphocytes Relative: 10 %
Lymphs Abs: 0.9 10*3/uL (ref 0.7–4.0)
MCH: 31.5 pg (ref 26.0–34.0)
MCHC: 32.8 g/dL (ref 30.0–36.0)
MCV: 96 fL (ref 80.0–100.0)
Monocytes Absolute: 0.5 10*3/uL (ref 0.1–1.0)
Monocytes Relative: 6 %
Neutro Abs: 7.3 10*3/uL (ref 1.7–7.7)
Neutrophils Relative %: 81 %
Platelet Count: 397 10*3/uL (ref 150–400)
RBC: 3.21 MIL/uL — ABNORMAL LOW (ref 3.87–5.11)
RDW: 14.7 % (ref 11.5–15.5)
WBC Count: 8.8 10*3/uL (ref 4.0–10.5)
nRBC: 0 % (ref 0.0–0.2)

## 2023-05-13 LAB — FERRITIN: Ferritin: 43 ng/mL (ref 11–307)

## 2023-05-13 NOTE — Progress Notes (Signed)
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   Patient was last seen by me on 01/18/2023 and she was doing well overall.   Patient notes she has been doing well overall since our last visit. She denies any new infection issues, fever, chills, night sweats, unexpected weight loss, back pain, chest pain, or leg swelling. She does complain of bilateral knee/leg pain.   Patient notes she recently had a fall where she hit her face and her right hand since our last visit. She had nose bleeds due to her fall. She was admitted to [facility]. Patient notes she has recovered well from her fall.   Patient has been regularly taking Hydroxyurea 500 mg 3 days a week: Monday, Wednesday, Friday. She has been tolerating her medication well without any new or severe toxicities.   Patient notes she has been eating well and has been staying hydrated.   She is complaint with all of her medications.  MEDICAL HISTORY:  Past Medical History:  Diagnosis Date   Arthritis    Hyperlipidemia    Sarcoidosis     SURGICAL HISTORY: Past Surgical History:  Procedure Laterality Date   ABDOMINAL HYSTERECTOMY     BREAST BIOPSY     BREAST SURGERY     REDUCTION MAMMAPLASTY      SOCIAL HISTORY: Social History   Socioeconomic History   Marital status: Widowed    Spouse name: Not on file   Number of children: Not on file   Years of education: Not on file   Highest education level: Not on file  Occupational History   Not on file  Tobacco Use    Smoking status: Former    Current packs/day: 0.00    Average packs/day: 2.0 packs/day for 45.0 years (90.0 ttl pk-yrs)    Types: Cigarettes    Start date: 10/15/1962    Quit date: 10/15/2007    Years since quitting: 15.5   Smokeless tobacco: Former  Building services engineer status: Never Used  Substance and Sexual Activity   Alcohol use: Yes   Drug use: No   Sexual activity: Not on file  Other Topics Concern   Not on file  Social History Narrative   Not on file   Social Determinants of Health   Financial Resource Strain: Not on file  Food Insecurity: Not on file  Transportation Needs: Not on file  Physical Activity: Not on file  Stress: Not on file  Social Connections: Not on file  Intimate Partner Violence: Not on file    FAMILY HISTORY: Family History  Problem Relation Age of Onset   Heart disease Father    Heart disease Brother     ALLERGIES:  is allergic to penicillins, codeine, and pentazocine.  MEDICATIONS:  Current Outpatient Medications  Medication Sig Dispense Refill   acetaminophen (TYLENOL) 325 MG tablet Take 1 tablet (325 mg total) by mouth every 6 (six) hours. (Patient taking differently: Take 325 mg by mouth every 6 (six) hours as needed for headache.) 30 tablet 0   amLODipine (NORVASC) 5 MG  tablet Take 5 mg by mouth every evening.      aspirin EC 81 MG tablet Take 81 mg by mouth daily.     atorvastatin (LIPITOR) 40 MG tablet Take 40 mg by mouth every evening.      carvedilol (COREG) 3.125 MG tablet Take 3.125 mg by mouth 2 (two) times daily with a meal.     Cholecalciferol (VITAMIN D3) 25 MCG (1000 UT) CAPS TAKE TWO (2) CAPSULES BY MOUTH ONCE DAILY 60 capsule 10   famotidine (PEPCID) 20 MG tablet One after supper 30 tablet 11   folic acid (FOLVITE) 1 MG tablet TAKE 1 TABLET BY MOUTH ONCE DAILY 30 tablet 10   hydroxyurea (HYDREA) 500 MG capsule TAKE ONE CAPSULE BY MOUTH ON DAYS MONDAY-THURSDAY. DO not take friday, saturday, OR sunday 30 capsule 3   iron  polysaccharides (NIFEREX) 150 MG capsule TAKE ONE CAPSULE BY MOUTH ONCE DAILY 30 capsule 2   levothyroxine (SYNTHROID) 50 MCG tablet Take 50 mcg by mouth every morning.     Melatonin 5 MG TABS Take 5 mg by mouth at bedtime.     omeprazole (PRILOSEC) 40 MG capsule Take 40 mg by mouth every evening.      potassium chloride SA (KLOR-CON M) 20 MEQ tablet TAKE ONE TABLET BY MOUTH ONCE daily 30 tablet 1   predniSONE (DELTASONE) 5 MG tablet Take 5 mg by mouth daily.     venlafaxine XR (EFFEXOR-XR) 150 MG 24 hr capsule Take 150 mg by mouth every evening.      No current facility-administered medications for this visit.    REVIEW OF SYSTEMS:    10 Point review of Systems was done is negative except as noted above.   PHYSICAL EXAMINATION: ECOG FS:2 - Symptomatic, <50% confined to bed  Vitals:   05/13/23 1400  BP: (!) 140/66  Pulse: 71  Resp: 16  Temp: 97.9 F (36.6 C)  SpO2: 97%    Wt Readings from Last 3 Encounters:  02/06/23 119 lb 6.4 oz (54.2 kg)  01/18/23 124 lb 8 oz (56.5 kg)  09/05/22 123 lb 6.4 oz (56 kg)  There is no height or weight on file to calculate BMI.   GENERAL:alert, in no acute distress and comfortable SKIN: no acute rashes, no significant lesions EYES: conjunctiva are pink and non-injected, sclera anicteric OROPHARYNX: MMM, no exudates, no oropharyngeal erythema or ulceration NECK: supple, no JVD LYMPH:  no palpable lymphadenopathy in the cervical, axillary or inguinal regions LUNGS: clear to auscultation b/l with normal respiratory effort HEART: regular rate & rhythm ABDOMEN:  normoactive bowel sounds , non tender, not distended. Extremity: no pedal edema PSYCH: alert & oriented x 3 with fluent speech NEURO: no focal motor/sensory deficits   LABORATORY DATA:  I have reviewed the data as listed     Latest Ref Rng & Units 05/13/2023    1:09 PM 02/06/2023    3:03 PM 01/18/2023    1:16 PM  CBC  WBC 4.0 - 10.5 K/uL 8.8  11.5  8.5   Hemoglobin 12.0 - 15.0  g/dL 84.1  32.4  9.7   Hematocrit 36.0 - 46.0 % 30.8  33.9  29.6   Platelets 150 - 400 K/uL 397  475  358     .    Latest Ref Rng & Units 05/13/2023    1:09 PM 02/06/2023    3:03 PM 01/18/2023    1:16 PM  CMP  Glucose 70 - 99 mg/dL 401  027  253  BUN 8 - 23 mg/dL 29  24  27    Creatinine 0.44 - 1.00 mg/dL 3.66  4.40  3.47   Sodium 135 - 145 mmol/L 137  139  141   Potassium 3.5 - 5.1 mmol/L 4.2  4.0  4.0   Chloride 98 - 111 mmol/L 104  103  107   CO2 22 - 32 mmol/L 25  24  26    Calcium 8.9 - 10.3 mg/dL 9.0  9.8  8.7   Total Protein 6.5 - 8.1 g/dL 6.9   6.6   Total Bilirubin <1.2 mg/dL 0.4   0.3   Alkaline Phos 38 - 126 U/L 88   102   AST 15 - 41 U/L 14   14   ALT 0 - 44 U/L 9   9    . Lab Results  Component Value Date   IRON 45 01/18/2023   TIBC 283 01/18/2023   IRONPCTSAT 16 01/18/2023   (Iron and TIBC)  Lab Results  Component Value Date   FERRITIN 94 01/18/2023           RADIOGRAPHIC STUDIES: I have personally reviewed the radiological images as listed and agreed with the findings in the report. US RENAL  Result Date: 05/02/2023 CLINICAL DATA:  Chronic renal disease EXAM: RENAL / URINARY TRACT ULTRASOUND COMPLETE COMPARISON:  None Available. FINDINGS: Right Kidney: Renal measurements: 8.0 x 3.7 x 3.5 cm = volume: 54 mL. Renal cortical thinning. Mild increased cortical echogenicity. There is a 2 cm hypoechoic mass lower pole, likely a complicated cyst. Left Kidney: Renal measurements: 9.0 x 4.6 x 3.8 cm = volume: 81 mL. Renal cortical thinning. Increased cortical echogenicity. No mass or hydronephrosis. Bladder: Appears normal for degree of bladder distention. Other: None. IMPRESSION: 1. No hydronephrosis. 2. Renal cortical thinning and increased cortical echogenicity as can be seen with chronic medical renal disease. 3. 2 cm hypoechoic mass lower pole right kidney, likely a complicated cyst. Recommend follow-up ultrasound in 6 months to assess for stability.  Electronically Signed   By: Annia Belt M.D.   On: 05/02/2023 21:04    ASSESSMENT & PLAN:   77 y.o. female with  1.  Multisystem sarcoidosis -follows with rheumatology Dr. Dierdre Forth Presented with Generalized lymphadenopathy with multiple pulmonary nodules CT Chest 09/19/2017: Moderate mediastinal and right supraclavicular lymphadenopathy, increased. New mild bilateral axillary and bilateral hilar lymphadenopathy. Findings are worrisome for a malignant process such as a lymphoproliferative disorder or metastatic nodal disease. The right supraclavicular node may be (but not definitely) accessible for percutaneous biopsy. 2. Numerous similar-appearing subsolid spiculated pulmonary nodules scattered throughout both lungs, upper lobe predominant, largest 1.6 cm, stable to mildly increased in size and slightly increased in density in the interval. These are indeterminate, with multifocal lung adenocarcinoma on the differential.  12/26/17 CT C/A/P revealed interval response to steroids in LNadenopathy   07/17/18 CT C/A/P revealed Continue improvement in mediastinal adenopathy. Lymph nodes are now not pathologic by size criteria. 2. Continued improvement in nodular and ground-glass densities particularly in the RIGHT upper lobe. 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  2. Breast - lesion - Bx -- granulomatous inflammation -consistent with sarcoidosis.  3. Skin rash on nose - Bx consistent with sarcoidosis. PLAN -Continue follow-up with Dr. Dierdre Forth. -Patient is currently on prednisone 5 mg p.o. daily and leflunomide.  4. H/o  Iron deficiency - no significant anemia . Lab Results  Component Value Date   IRON 45 01/18/2023   TIBC 283 01/18/2023  IRONPCTSAT 16 01/18/2023   (Iron and TIBC)  Lab Results  Component Value Date   FERRITIN 94 01/18/2023   5.  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 CBC shows patient is slightly anemic with hemoglobin of 10.1 g/dL and hematocrit of 40.9%, stable WBC and platelets. CMP shows elevated BUN of 29 and elevated Creatinine of 1.82.   -mild anemia -no indication for IV iron at this time -continue iron polysaccharide once daily --will cut dose of 500 mg Hydroxyurea to 3 days a week: Monday Wednesday, Friday to balance anemia and platelet levels -No symptoms suggestive of abnormal bleeding or bruising.  No symptoms suggestive of VTE. -Continue aspirin 81 mg p.o. daily -Continue vitamin B complex and folic acid daily -Recommend influenza vaccine, COVID-19 Booster, RSV vaccine, and other age related vaccines.  -Answered all of patient's questions.   FOLLOW UP: RTC with Dr Candise Che with labs in 4 months   The total time spent in the appointment was 20 minutes* .  All of the patient's questions were answered with apparent satisfaction. The patient knows to call the clinic with any problems, questions or concerns.   Wyvonnia Lora MD MS AAHIVMS [facility] Hematology/Oncology Physician [facility]  .*Total Encounter Time as defined by the Centers for Medicare and Medicaid Services includes, in addition to the face-to-face time of a patient visit (documented in the note above) non-face-to-face time: obtaining and reviewing outside history, ordering and reviewing medications, tests or procedures, care coordination (communications with other health care professionals or caregivers) and documentation in the medical record.   I,Alexis Murphy,acting as a Neurosurgeon for Wyvonnia Lora,  MD.,have documented all relevant documentation on the behalf of Wyvonnia Lora, MD,as directed by  Wyvonnia Lora, MD while in the presence of Wyvonnia Lora, MD.  .I have reviewed the above documentation for accuracy and completeness, and I agree with the above. Johney Maine MD

## 2023-05-15 ENCOUNTER — Telehealth: Payer: Self-pay | Admitting: Hematology

## 2023-05-15 NOTE — Telephone Encounter (Signed)
Left patient a message in regards in scheduled appointment times/dates

## 2023-05-16 DIAGNOSIS — Z6822 Body mass index (BMI) 22.0-22.9, adult: Secondary | ICD-10-CM | POA: Diagnosis not present

## 2023-05-16 DIAGNOSIS — N1832 Chronic kidney disease, stage 3b: Secondary | ICD-10-CM | POA: Diagnosis not present

## 2023-05-16 DIAGNOSIS — D473 Essential (hemorrhagic) thrombocythemia: Secondary | ICD-10-CM | POA: Diagnosis not present

## 2023-05-16 DIAGNOSIS — Z1589 Genetic susceptibility to other disease: Secondary | ICD-10-CM | POA: Diagnosis not present

## 2023-05-16 DIAGNOSIS — D869 Sarcoidosis, unspecified: Secondary | ICD-10-CM | POA: Diagnosis not present

## 2023-05-16 DIAGNOSIS — I7300 Raynaud's syndrome without gangrene: Secondary | ICD-10-CM | POA: Diagnosis not present

## 2023-05-16 DIAGNOSIS — Z7952 Long term (current) use of systemic steroids: Secondary | ICD-10-CM | POA: Diagnosis not present

## 2023-05-16 DIAGNOSIS — H348112 Central retinal vein occlusion, right eye, stable: Secondary | ICD-10-CM | POA: Diagnosis not present

## 2023-05-16 DIAGNOSIS — M170 Bilateral primary osteoarthritis of knee: Secondary | ICD-10-CM | POA: Diagnosis not present

## 2023-05-16 DIAGNOSIS — M25552 Pain in left hip: Secondary | ICD-10-CM | POA: Diagnosis not present

## 2023-05-16 DIAGNOSIS — L52 Erythema nodosum: Secondary | ICD-10-CM | POA: Diagnosis not present

## 2023-05-19 ENCOUNTER — Encounter: Payer: Self-pay | Admitting: Hematology

## 2023-05-31 DIAGNOSIS — M170 Bilateral primary osteoarthritis of knee: Secondary | ICD-10-CM | POA: Diagnosis not present

## 2023-05-31 DIAGNOSIS — M25562 Pain in left knee: Secondary | ICD-10-CM | POA: Diagnosis not present

## 2023-05-31 DIAGNOSIS — M25561 Pain in right knee: Secondary | ICD-10-CM | POA: Diagnosis not present

## 2023-05-31 DIAGNOSIS — R262 Difficulty in walking, not elsewhere classified: Secondary | ICD-10-CM | POA: Diagnosis not present

## 2023-06-07 ENCOUNTER — Emergency Department (HOSPITAL_COMMUNITY): Payer: Medicare HMO

## 2023-06-07 ENCOUNTER — Inpatient Hospital Stay (HOSPITAL_COMMUNITY)
Admission: EM | Admit: 2023-06-07 | Discharge: 2023-06-13 | DRG: 291 | Disposition: A | Discharge status: Home / Self Care | Payer: Medicare HMO | Attending: Internal Medicine | Admitting: Internal Medicine

## 2023-06-07 ENCOUNTER — Other Ambulatory Visit: Payer: Self-pay

## 2023-06-07 ENCOUNTER — Inpatient Hospital Stay (HOSPITAL_COMMUNITY): Payer: Medicare HMO

## 2023-06-07 ENCOUNTER — Encounter (HOSPITAL_COMMUNITY): Payer: Self-pay

## 2023-06-07 DIAGNOSIS — I480 Paroxysmal atrial fibrillation: Secondary | ICD-10-CM | POA: Diagnosis not present

## 2023-06-07 DIAGNOSIS — R000 Tachycardia, unspecified: Secondary | ICD-10-CM | POA: Diagnosis not present

## 2023-06-07 DIAGNOSIS — Z8249 Family history of ischemic heart disease and other diseases of the circulatory system: Secondary | ICD-10-CM

## 2023-06-07 DIAGNOSIS — D473 Essential (hemorrhagic) thrombocythemia: Secondary | ICD-10-CM | POA: Diagnosis not present

## 2023-06-07 DIAGNOSIS — I351 Nonrheumatic aortic (valve) insufficiency: Secondary | ICD-10-CM | POA: Diagnosis not present

## 2023-06-07 DIAGNOSIS — E876 Hypokalemia: Secondary | ICD-10-CM | POA: Diagnosis not present

## 2023-06-07 DIAGNOSIS — Z7952 Long term (current) use of systemic steroids: Secondary | ICD-10-CM | POA: Diagnosis not present

## 2023-06-07 DIAGNOSIS — D869 Sarcoidosis, unspecified: Secondary | ICD-10-CM | POA: Diagnosis not present

## 2023-06-07 DIAGNOSIS — I350 Nonrheumatic aortic (valve) stenosis: Secondary | ICD-10-CM | POA: Diagnosis not present

## 2023-06-07 DIAGNOSIS — Z90710 Acquired absence of both cervix and uterus: Secondary | ICD-10-CM | POA: Diagnosis not present

## 2023-06-07 DIAGNOSIS — D509 Iron deficiency anemia, unspecified: Secondary | ICD-10-CM | POA: Diagnosis present

## 2023-06-07 DIAGNOSIS — I5023 Acute on chronic systolic (congestive) heart failure: Secondary | ICD-10-CM

## 2023-06-07 DIAGNOSIS — I130 Hypertensive heart and chronic kidney disease with heart failure and stage 1 through stage 4 chronic kidney disease, or unspecified chronic kidney disease: Secondary | ICD-10-CM | POA: Diagnosis not present

## 2023-06-07 DIAGNOSIS — E785 Hyperlipidemia, unspecified: Secondary | ICD-10-CM | POA: Diagnosis not present

## 2023-06-07 DIAGNOSIS — R918 Other nonspecific abnormal finding of lung field: Secondary | ICD-10-CM | POA: Diagnosis not present

## 2023-06-07 DIAGNOSIS — Z743 Need for continuous supervision: Secondary | ICD-10-CM | POA: Diagnosis not present

## 2023-06-07 DIAGNOSIS — N179 Acute kidney failure, unspecified: Secondary | ICD-10-CM | POA: Diagnosis not present

## 2023-06-07 DIAGNOSIS — I447 Left bundle-branch block, unspecified: Secondary | ICD-10-CM | POA: Diagnosis not present

## 2023-06-07 DIAGNOSIS — I10 Essential (primary) hypertension: Secondary | ICD-10-CM | POA: Diagnosis not present

## 2023-06-07 DIAGNOSIS — R0902 Hypoxemia: Secondary | ICD-10-CM | POA: Diagnosis not present

## 2023-06-07 DIAGNOSIS — E039 Hypothyroidism, unspecified: Secondary | ICD-10-CM

## 2023-06-07 DIAGNOSIS — I4891 Unspecified atrial fibrillation: Secondary | ICD-10-CM

## 2023-06-07 DIAGNOSIS — R0609 Other forms of dyspnea: Secondary | ICD-10-CM | POA: Diagnosis not present

## 2023-06-07 DIAGNOSIS — N1832 Chronic kidney disease, stage 3b: Secondary | ICD-10-CM | POA: Diagnosis not present

## 2023-06-07 DIAGNOSIS — Z7901 Long term (current) use of anticoagulants: Secondary | ICD-10-CM

## 2023-06-07 DIAGNOSIS — J811 Chronic pulmonary edema: Secondary | ICD-10-CM | POA: Diagnosis not present

## 2023-06-07 DIAGNOSIS — R0602 Shortness of breath: Secondary | ICD-10-CM | POA: Diagnosis not present

## 2023-06-07 DIAGNOSIS — J441 Chronic obstructive pulmonary disease with (acute) exacerbation: Secondary | ICD-10-CM | POA: Diagnosis not present

## 2023-06-07 DIAGNOSIS — Z79899 Other long term (current) drug therapy: Secondary | ICD-10-CM | POA: Diagnosis not present

## 2023-06-07 DIAGNOSIS — K8020 Calculus of gallbladder without cholecystitis without obstruction: Secondary | ICD-10-CM | POA: Diagnosis not present

## 2023-06-07 DIAGNOSIS — Z79890 Hormone replacement therapy (postmenopausal): Secondary | ICD-10-CM | POA: Diagnosis not present

## 2023-06-07 DIAGNOSIS — J439 Emphysema, unspecified: Secondary | ICD-10-CM | POA: Diagnosis not present

## 2023-06-07 DIAGNOSIS — F17210 Nicotine dependence, cigarettes, uncomplicated: Secondary | ICD-10-CM | POA: Diagnosis not present

## 2023-06-07 DIAGNOSIS — I739 Peripheral vascular disease, unspecified: Secondary | ICD-10-CM | POA: Diagnosis present

## 2023-06-07 DIAGNOSIS — J449 Chronic obstructive pulmonary disease, unspecified: Secondary | ICD-10-CM

## 2023-06-07 DIAGNOSIS — Z880 Allergy status to penicillin: Secondary | ICD-10-CM

## 2023-06-07 DIAGNOSIS — I352 Nonrheumatic aortic (valve) stenosis with insufficiency: Secondary | ICD-10-CM | POA: Diagnosis present

## 2023-06-07 DIAGNOSIS — I9589 Other hypotension: Secondary | ICD-10-CM | POA: Diagnosis present

## 2023-06-07 DIAGNOSIS — I5021 Acute systolic (congestive) heart failure: Secondary | ICD-10-CM | POA: Diagnosis not present

## 2023-06-07 DIAGNOSIS — Z885 Allergy status to narcotic agent status: Secondary | ICD-10-CM | POA: Diagnosis not present

## 2023-06-07 DIAGNOSIS — J9601 Acute respiratory failure with hypoxia: Principal | ICD-10-CM | POA: Diagnosis present

## 2023-06-07 DIAGNOSIS — J9811 Atelectasis: Secondary | ICD-10-CM | POA: Diagnosis not present

## 2023-06-07 DIAGNOSIS — I701 Atherosclerosis of renal artery: Secondary | ICD-10-CM | POA: Diagnosis not present

## 2023-06-07 DIAGNOSIS — I2489 Other forms of acute ischemic heart disease: Secondary | ICD-10-CM | POA: Diagnosis present

## 2023-06-07 DIAGNOSIS — I213 ST elevation (STEMI) myocardial infarction of unspecified site: Secondary | ICD-10-CM | POA: Diagnosis not present

## 2023-06-07 DIAGNOSIS — I493 Ventricular premature depolarization: Secondary | ICD-10-CM | POA: Diagnosis present

## 2023-06-07 DIAGNOSIS — Z7982 Long term (current) use of aspirin: Secondary | ICD-10-CM

## 2023-06-07 DIAGNOSIS — R7989 Other specified abnormal findings of blood chemistry: Secondary | ICD-10-CM

## 2023-06-07 DIAGNOSIS — D860 Sarcoidosis of lung: Secondary | ICD-10-CM | POA: Diagnosis present

## 2023-06-07 DIAGNOSIS — I4819 Other persistent atrial fibrillation: Secondary | ICD-10-CM | POA: Diagnosis present

## 2023-06-07 LAB — CBC WITH DIFFERENTIAL/PLATELET
Abs Immature Granulocytes: 0.13 10*3/uL — ABNORMAL HIGH (ref 0.00–0.07)
Basophils Absolute: 0.1 10*3/uL (ref 0.0–0.1)
Basophils Relative: 1 %
Eosinophils Absolute: 0.2 10*3/uL (ref 0.0–0.5)
Eosinophils Relative: 2 %
HCT: 33 % — ABNORMAL LOW (ref 36.0–46.0)
Hemoglobin: 10 g/dL — ABNORMAL LOW (ref 12.0–15.0)
Immature Granulocytes: 1 %
Lymphocytes Relative: 17 %
Lymphs Abs: 2.5 10*3/uL (ref 0.7–4.0)
MCH: 29.5 pg (ref 26.0–34.0)
MCHC: 30.3 g/dL (ref 30.0–36.0)
MCV: 97.3 fL (ref 80.0–100.0)
Monocytes Absolute: 0.8 10*3/uL (ref 0.1–1.0)
Monocytes Relative: 6 %
Neutro Abs: 10.6 10*3/uL — ABNORMAL HIGH (ref 1.7–7.7)
Neutrophils Relative %: 73 %
Platelets: 579 10*3/uL — ABNORMAL HIGH (ref 150–400)
RBC: 3.39 MIL/uL — ABNORMAL LOW (ref 3.87–5.11)
RDW: 14.4 % (ref 11.5–15.5)
WBC: 14.3 10*3/uL — ABNORMAL HIGH (ref 4.0–10.5)
nRBC: 0 % (ref 0.0–0.2)

## 2023-06-07 LAB — ECHOCARDIOGRAM COMPLETE
AR max vel: 1.1 cm2
AV Area VTI: 1.08 cm2
AV Area mean vel: 1.03 cm2
AV Mean grad: 13 mm[Hg]
AV Peak grad: 21 mm[Hg]
Ao pk vel: 2.29 m/s
Area-P 1/2: 4.68 cm2
Height: 59 in
P 1/2 time: 250 ms
S' Lateral: 4.4 cm
Weight: 1940.05 [oz_av]

## 2023-06-07 LAB — RESP PANEL BY RT-PCR (RSV, FLU A&B, COVID)  RVPGX2
Influenza A by PCR: NEGATIVE
Influenza B by PCR: NEGATIVE
Resp Syncytial Virus by PCR: NEGATIVE
SARS Coronavirus 2 by RT PCR: NEGATIVE

## 2023-06-07 LAB — COMPREHENSIVE METABOLIC PANEL
ALT: 25 U/L (ref 0–44)
AST: 42 U/L — ABNORMAL HIGH (ref 15–41)
Albumin: 3.4 g/dL — ABNORMAL LOW (ref 3.5–5.0)
Alkaline Phosphatase: 76 U/L (ref 38–126)
Anion gap: 14 (ref 5–15)
BUN: 36 mg/dL — ABNORMAL HIGH (ref 8–23)
CO2: 14 mmol/L — ABNORMAL LOW (ref 22–32)
Calcium: 8.5 mg/dL — ABNORMAL LOW (ref 8.9–10.3)
Chloride: 112 mmol/L — ABNORMAL HIGH (ref 98–111)
Creatinine, Ser: 2.07 mg/dL — ABNORMAL HIGH (ref 0.44–1.00)
GFR, Estimated: 24 mL/min — ABNORMAL LOW (ref >60)
Glucose, Bld: 231 mg/dL — ABNORMAL HIGH (ref 70–99)
Potassium: 3.7 mmol/L (ref 3.5–5.1)
Sodium: 140 mmol/L (ref 135–145)
Total Bilirubin: 0.4 mg/dL (ref <1.2)
Total Protein: 6.2 g/dL — ABNORMAL LOW (ref 6.5–8.1)

## 2023-06-07 LAB — IRON AND TIBC
Iron: 23 ug/dL — ABNORMAL LOW (ref 28–170)
Saturation Ratios: 8 % — ABNORMAL LOW (ref 10.4–31.8)
TIBC: 298 ug/dL (ref 250–450)
UIBC: 275 ug/dL

## 2023-06-07 LAB — TROPONIN I (HIGH SENSITIVITY)
Troponin I (High Sensitivity): 95 ng/L — ABNORMAL HIGH (ref <18)
Troponin I (High Sensitivity): 169 ng/L (ref <18)
Troponin I (High Sensitivity): 294 ng/L (ref <18)
Troponin I (High Sensitivity): 291 ng/L (ref <18)

## 2023-06-07 LAB — I-STAT ARTERIAL BLOOD GAS, ED
Acid-base deficit: 11 mmol/L — ABNORMAL HIGH (ref 0.0–2.0)
Bicarbonate: 15.2 mmol/L — ABNORMAL LOW (ref 20.0–28.0)
Calcium, Ion: 1.1 mmol/L — ABNORMAL LOW (ref 1.15–1.40)
HCT: 29 % — ABNORMAL LOW (ref 36.0–46.0)
Hemoglobin: 9.9 g/dL — ABNORMAL LOW (ref 12.0–15.0)
O2 Saturation: 82 %
Potassium: 3.7 mmol/L (ref 3.5–5.1)
Sodium: 140 mmol/L (ref 135–145)
TCO2: 16 mmol/L — ABNORMAL LOW (ref 22–32)
pCO2 arterial: 35.4 mm[Hg] (ref 32–48)
pH, Arterial: 7.241 — ABNORMAL LOW (ref 7.35–7.45)
pO2, Arterial: 53 mm[Hg] — ABNORMAL LOW (ref 83–108)

## 2023-06-07 LAB — BRAIN NATRIURETIC PEPTIDE: B Natriuretic Peptide: 967.4 pg/mL — ABNORMAL HIGH (ref 0.0–100.0)

## 2023-06-07 LAB — I-STAT CG4 LACTIC ACID, ED
Lactic Acid, Venous: 3.5 mmol/L (ref 0.5–1.9)
Lactic Acid, Venous: 1.3 mmol/L (ref 0.5–1.9)

## 2023-06-07 LAB — VITAMIN D 25 HYDROXY (VIT D DEFICIENCY, FRACTURES): Vit D, 25-Hydroxy: 54.43 ng/mL (ref 30–100)

## 2023-06-07 LAB — FERRITIN: Ferritin: 97 ng/mL (ref 11–307)

## 2023-06-07 LAB — TSH: TSH: 1.657 u[IU]/mL (ref 0.350–4.500)

## 2023-06-07 LAB — MAGNESIUM: Magnesium: 2.4 mg/dL (ref 1.7–2.4)

## 2023-06-07 LAB — FOLATE: Folate: 32.5 ng/mL (ref >5.9)

## 2023-06-07 LAB — PHOSPHORUS: Phosphorus: 3.4 mg/dL (ref 2.5–4.6)

## 2023-06-07 LAB — VITAMIN B12: Vitamin B-12: 499 pg/mL (ref 180–914)

## 2023-06-07 MED ORDER — SENNOSIDES-DOCUSATE SODIUM 8.6-50 MG PO TABS: 1.0000 | ORAL_TABLET | Freq: Every evening | ORAL | Status: DC | PRN

## 2023-06-07 MED ORDER — FUROSEMIDE 10 MG/ML IJ SOLN
40.0000 mg | Freq: Every day | INTRAMUSCULAR | Status: DC
  Administered 2023-06-08: 40 mg via INTRAVENOUS
  Filled 2023-06-07 (×2): qty 4

## 2023-06-07 MED ORDER — IPRATROPIUM-ALBUTEROL 0.5-2.5 (3) MG/3ML IN SOLN
RESPIRATORY_TRACT | Status: AC
  Filled 2023-06-07: qty 9

## 2023-06-07 MED ORDER — ENOXAPARIN SODIUM 30 MG/0.3ML IJ SOSY
30.0000 mg | PREFILLED_SYRINGE | INTRAMUSCULAR | Status: DC
  Administered 2023-06-07 – 2023-06-10 (×4): 30 mg via SUBCUTANEOUS
  Filled 2023-06-07 (×4): qty 0.3

## 2023-06-07 MED ORDER — FOLIC ACID 1 MG PO TABS
1.0000 mg | ORAL_TABLET | Freq: Every day | ORAL | Status: DC
  Administered 2023-06-07 – 2023-06-13 (×7): 1 mg via ORAL
  Filled 2023-06-07 (×7): qty 1

## 2023-06-07 MED ORDER — IPRATROPIUM-ALBUTEROL 0.5-2.5 (3) MG/3ML IN SOLN: 3.0000 mL | Freq: Four times a day (QID) | RESPIRATORY_TRACT | Status: DC | PRN

## 2023-06-07 MED ORDER — PREDNISONE 20 MG PO TABS
40.0000 mg | ORAL_TABLET | Freq: Every day | ORAL | Status: DC
  Administered 2023-06-08: 40 mg via ORAL
  Filled 2023-06-07: qty 2

## 2023-06-07 MED ORDER — PERFLUTREN LIPID MICROSPHERE
1.0000 mL | INTRAVENOUS | Status: AC | PRN
  Administered 2023-06-07: 2 mL via INTRAVENOUS

## 2023-06-07 MED ORDER — CARVEDILOL 3.125 MG PO TABS
3.1250 mg | ORAL_TABLET | Freq: Two times a day (BID) | ORAL | Status: DC
  Administered 2023-06-07 – 2023-06-10 (×6): 3.125 mg via ORAL
  Filled 2023-06-07 (×6): qty 1

## 2023-06-07 MED ORDER — LEVOFLOXACIN IN D5W 750 MG/150ML IV SOLN
750.0000 mg | Freq: Once | INTRAVENOUS | Status: AC
  Administered 2023-06-07: 750 mg via INTRAVENOUS
  Filled 2023-06-07: qty 150

## 2023-06-07 MED ORDER — AMLODIPINE BESYLATE 5 MG PO TABS
5.0000 mg | ORAL_TABLET | Freq: Every evening | ORAL | Status: DC
  Administered 2023-06-07: 5 mg via ORAL
  Filled 2023-06-07: qty 1

## 2023-06-07 MED ORDER — HYDRALAZINE HCL 25 MG PO TABS
25.0000 mg | ORAL_TABLET | Freq: Three times a day (TID) | ORAL | Status: DC
  Administered 2023-06-07 – 2023-06-10 (×9): 25 mg via ORAL
  Filled 2023-06-07 (×9): qty 1

## 2023-06-07 MED ORDER — HYDROXYUREA 500 MG PO CAPS
500.0000 mg | ORAL_CAPSULE | ORAL | Status: DC
  Administered 2023-06-07 – 2023-06-12 (×3): 500 mg via ORAL
  Filled 2023-06-07 (×4): qty 1

## 2023-06-07 MED ORDER — ONDANSETRON HCL 4 MG PO TABS: 4.0000 mg | ORAL_TABLET | Freq: Four times a day (QID) | ORAL | Status: DC | PRN

## 2023-06-07 MED ORDER — ATORVASTATIN CALCIUM 80 MG PO TABS
80.0000 mg | ORAL_TABLET | Freq: Every evening | ORAL | Status: DC
  Administered 2023-06-07 – 2023-06-12 (×6): 80 mg via ORAL
  Filled 2023-06-07 (×6): qty 1

## 2023-06-07 MED ORDER — ONDANSETRON HCL 4 MG/2ML IJ SOLN: 4.0000 mg | Freq: Four times a day (QID) | INTRAMUSCULAR | Status: DC | PRN

## 2023-06-07 MED ORDER — ASPIRIN 81 MG PO TBEC
81.0000 mg | DELAYED_RELEASE_TABLET | Freq: Every day | ORAL | Status: DC
  Administered 2023-06-07 – 2023-06-13 (×7): 81 mg via ORAL
  Filled 2023-06-07 (×7): qty 1

## 2023-06-07 MED ORDER — ACETAMINOPHEN 650 MG RE SUPP: 650.0000 mg | Freq: Four times a day (QID) | RECTAL | Status: DC | PRN

## 2023-06-07 MED ORDER — MELATONIN 5 MG PO TABS
5.0000 mg | ORAL_TABLET | Freq: Every day | ORAL | Status: DC
  Administered 2023-06-07 – 2023-06-12 (×6): 5 mg via ORAL
  Filled 2023-06-07 (×6): qty 1

## 2023-06-07 MED ORDER — UMECLIDINIUM BROMIDE 62.5 MCG/ACT IN AEPB
1.0000 | INHALATION_SPRAY | Freq: Every day | RESPIRATORY_TRACT | Status: DC
  Administered 2023-06-07 – 2023-06-13 (×7): 1 via RESPIRATORY_TRACT
  Filled 2023-06-07: qty 7

## 2023-06-07 MED ORDER — LEVOTHYROXINE SODIUM 50 MCG PO TABS
50.0000 ug | ORAL_TABLET | ORAL | Status: DC
Start: 2023-06-08 — End: 2023-06-13
  Administered 2023-06-08 – 2023-06-13 (×6): 50 ug via ORAL
  Filled 2023-06-07 (×6): qty 1

## 2023-06-07 MED ORDER — FUROSEMIDE 10 MG/ML IJ SOLN
40.0000 mg | Freq: Once | INTRAMUSCULAR | Status: AC
  Administered 2023-06-07: 40 mg via INTRAVENOUS
  Filled 2023-06-07: qty 4

## 2023-06-07 MED ORDER — ACETAMINOPHEN 325 MG PO TABS: 650.0000 mg | ORAL_TABLET | Freq: Four times a day (QID) | ORAL | Status: DC | PRN

## 2023-06-07 MED ORDER — IOHEXOL 350 MG/ML SOLN
75.0000 mL | Freq: Once | INTRAVENOUS | Status: AC | PRN
  Administered 2023-06-07: 75 mL via INTRAVENOUS

## 2023-06-07 MED ORDER — VENLAFAXINE HCL ER 75 MG PO CP24
150.0000 mg | ORAL_CAPSULE | Freq: Every evening | ORAL | Status: DC
  Administered 2023-06-07 – 2023-06-12 (×6): 150 mg via ORAL
  Filled 2023-06-07 (×2): qty 2
  Filled 2023-06-07: qty 1
  Filled 2023-06-07: qty 2
  Filled 2023-06-07 (×2): qty 1

## 2023-06-07 MED ORDER — LEFLUNOMIDE 10 MG PO TABS
20.0000 mg | ORAL_TABLET | Freq: Every day | ORAL | Status: DC
  Administered 2023-06-07 – 2023-06-13 (×7): 20 mg via ORAL
  Filled 2023-06-07 (×8): qty 2

## 2023-06-07 MED ORDER — PANTOPRAZOLE SODIUM 40 MG PO TBEC
40.0000 mg | DELAYED_RELEASE_TABLET | Freq: Every day | ORAL | Status: DC
  Administered 2023-06-07 – 2023-06-13 (×7): 40 mg via ORAL
  Filled 2023-06-07 (×6): qty 1

## 2023-06-07 NOTE — ED Notes (Signed)
Patient states she is feeling so much better.

## 2023-06-07 NOTE — ED Notes (Signed)
Transported to echo

## 2023-06-07 NOTE — ED Notes (Signed)
  Son at bedside. EDP aware of trop,95

## 2023-06-07 NOTE — Progress Notes (Signed)
ED Pharmacy Antibiotic Sign Off An antibiotic consult was received from an ED provider for levofloxacin per pharmacy dosing for CAP. A chart review was completed to assess appropriateness.   Patient has a documented allergy to penicillin on file listed as anaphylaxis/rash. Chart notes in 2021 say that patient has tolerated keflex in the past but no other history of cephalosporin use upon chart review. Patient is currently on Bipap and was unable to ask patient about the reaction so will utilize an alternative agent for now.   The following one time order(s) were placed:  Levofloxacin 750 mg IV x 1  Further antibiotic and/or antibiotic pharmacy consults should be ordered by the admitting provider if indicated.   Thank you for allowing pharmacy to be a part of this patient's care.   Griffin Dakin, [facility]  Clinical Pharmacist 06/07/23 7:15 AM

## 2023-06-07 NOTE — ED Notes (Signed)
All of patients belongings taken home by son ,her pocketbook and robe, he did leave her glasses.

## 2023-06-07 NOTE — ED Notes (Signed)
ED TO INPATIENT HANDOFF REPORT  ED Nurse Name and Phone #: [phone]  S Name/Age/Gender Alexis Murphy 77 y.o. female Room/Bed: 016C/016C  Code Status   Code Status: Full Code  Home/SNF/Other Home Patient oriented to: self, place, time, and situation Is this baseline? Yes   Triage Complete: Triage complete  Chief Complaint Acute respiratory failure with hypoxia (HCC) [J96.01]  Triage Note Pt from home via [facility], pt awoke at 0500 with DIB, used inhaler multiple times without relief. On arrival of EMS sats 80% on RA. Placed on CPAP and given 7.5 alutarol, duoneb, 2gm mag, 125mg  solumedrol, 0.4 S/L NTG   Allergies Allergies  Allergen Reactions   Penicillins Anaphylaxis and Rash    Did it involve swelling of the face/tongue/throat, SOB, or low BP? Yes  Did it involve sudden or severe rash/hives, skin peeling, or any reaction on the inside of your mouth or nose? No Did you need to seek medical attention at a hospital or doctor's office? No When did it last happen? Childhood If all above answers are "NO", may proceed with cephalosporin use.    Codeine Other (See Comments)    Vomiting    Pentazocine     Other reaction(s): itch    Level of Care/Admitting Diagnosis ED Disposition     ED Disposition  Admit   Condition  --   Comment  Hospital Area: [facility] [[id number]]  Level of Care: Telemetry Medical [[id number]]  May admit patient to [facility] or [facility] if equivalent level of care is available:: No  Covid Evaluation: Asymptomatic - no recent exposure (last 10 days) testing not required  Diagnosis: Acute respiratory failure with hypoxia [facility]) [[id number]]  Admitting Physician: Steffanie Rainwater [[id number]]  Attending Physician: Steffanie Rainwater [[id number]]  Certification:: I certify this patient will need inpatient services for at least 2 midnights  Expected Medical Readiness: 06/09/2023          B Medical/Surgery History Past Medical History:   Diagnosis Date   Arthritis    Hyperlipidemia    Sarcoidosis    Past Surgical History:  Procedure Laterality Date   ABDOMINAL HYSTERECTOMY     BREAST BIOPSY     BREAST SURGERY     REDUCTION MAMMAPLASTY       A IV Location/Drains/Wounds Patient Lines/Drains/Airways Status     Active Line/Drains/Airways     Name Placement date Placement time Site Days   Peripheral IV 06/07/23 20 G Left Antecubital 06/07/23  0530  Antecubital  less than 1   Peripheral IV 06/07/23 22 G Posterior;Right Forearm 06/07/23  0615  Forearm  less than 1   External Urinary Catheter 06/07/23  0750  --  less than 1            Intake/Output Last 24 hours No intake or output data in the 24 hours ending 06/07/23 1043  Labs/Imaging Results for orders placed or performed during the hospital encounter of 06/07/23 (from the past 48 hour(s))  Culture, blood (single) w Reflex to ID Panel     Status: None (Preliminary result)   Collection Time: 06/07/23  6:12 AM   Specimen: BLOOD  Result Value Ref Range   Specimen Description BLOOD BLOOD RIGHT HAND    Special Requests      BOTTLES DRAWN AEROBIC AND ANAEROBIC Blood Culture results may not be optimal due to an inadequate volume of blood received in culture bottles   Culture      NO GROWTH < 12  HOURS Performed at [facility], [address], [place], [place] [address]    Report Status PENDING   Resp panel by RT-PCR (RSV, Flu A&B, Covid) Anterior Nasal Swab     Status: None   Collection Time: 06/07/23  6:12 AM   Specimen: Anterior Nasal Swab  Result Value Ref Range   SARS Coronavirus 2 by RT PCR NEGATIVE NEGATIVE   Influenza A by PCR NEGATIVE NEGATIVE   Influenza B by PCR NEGATIVE NEGATIVE    Comment: (NOTE) The Xpert Xpress SARS-CoV-2/FLU/RSV plus assay is intended as an aid in the diagnosis of influenza from Nasopharyngeal swab specimens and should not be used as a sole basis for treatment. Nasal washings and aspirates are unacceptable for  Xpert Xpress SARS-CoV-2/FLU/RSV testing.  Fact Sheet for Patients: BloggerCourse.com  Fact Sheet for Healthcare Providers: SeriousBroker.it  This test is not yet approved or cleared by the [place] FDA and has been authorized for detection and/or diagnosis of SARS-CoV-2 by FDA under an Emergency Use Authorization (EUA). This EUA will remain in effect (meaning this test can be used) for the duration of the COVID-19 declaration under Section 564(b)(1) of the Act, 21 U.S.C. section 360bbb-3(b)(1), unless the authorization is terminated or revoked.     Resp Syncytial Virus by PCR NEGATIVE NEGATIVE    Comment: (NOTE) Fact Sheet for Patients: BloggerCourse.com  Fact Sheet for Healthcare Providers: SeriousBroker.it  This test is not yet approved or cleared by the [place] FDA and has been authorized for detection and/or diagnosis of SARS-CoV-2 by FDA under an Emergency Use Authorization (EUA). This EUA will remain in effect (meaning this test can be used) for the duration of the COVID-19 declaration under Section 564(b)(1) of the Act, 21 U.S.C. section 360bbb-3(b)(1), unless the authorization is terminated or revoked.  Performed at [facility], [address], [place], [place] [address]   CBC with Differential/Platelet     Status: Abnormal   Collection Time: 06/07/23  6:30 AM  Result Value Ref Range   WBC 14.3 (H) 4.0 - 10.5 K/uL   RBC 3.39 (L) 3.87 - 5.11 MIL/uL   Hemoglobin 10.0 (L) 12.0 - 15.0 g/dL   HCT 29.5 (L) 28.4 - 13.2 %   MCV 97.3 80.0 - 100.0 fL   MCH 29.5 26.0 - 34.0 pg   MCHC 30.3 30.0 - 36.0 g/dL   RDW 44.0 10.2 - 72.5 %   Platelets 579 (H) 150 - 400 K/uL   nRBC 0.0 0.0 - 0.2 %   Neutrophils Relative % 73 %   Neutro Abs 10.6 (H) 1.7 - 7.7 K/uL   Lymphocytes Relative 17 %   Lymphs Abs 2.5 0.7 - 4.0 K/uL   Monocytes Relative 6 %   Monocytes  Absolute 0.8 0.1 - 1.0 K/uL   Eosinophils Relative 2 %   Eosinophils Absolute 0.2 0.0 - 0.5 K/uL   Basophils Relative 1 %   Basophils Absolute 0.1 0.0 - 0.1 K/uL   Immature Granulocytes 1 %   Abs Immature Granulocytes 0.13 (H) 0.00 - 0.07 K/uL    Comment: Performed at [facility], [address], [place], [place] [address]  Comprehensive metabolic panel     Status: Abnormal   Collection Time: 06/07/23  6:30 AM  Result Value Ref Range   Sodium 140 135 - 145 mmol/L   Potassium 3.7 3.5 - 5.1 mmol/L   Chloride 112 (H) 98 - 111 mmol/L   CO2 14 (L) 22 - 32  mmol/L   Glucose, Bld 231 (H) 70 - 99 mg/dL    Comment: Glucose reference range applies only to samples taken after fasting for at least 8 hours.   BUN 36 (H) 8 - 23 mg/dL   Creatinine, Ser 8.29 (H) 0.44 - 1.00 mg/dL   Calcium 8.5 (L) 8.9 - 10.3 mg/dL   Total Protein 6.2 (L) 6.5 - 8.1 g/dL   Albumin 3.4 (L) 3.5 - 5.0 g/dL   AST 42 (H) 15 - 41 U/L   ALT 25 0 - 44 U/L   Alkaline Phosphatase 76 38 - 126 U/L   Total Bilirubin 0.4 <1.2 mg/dL   GFR, Estimated 24 (L) >60 mL/min    Comment: (NOTE) Calculated using the CKD-EPI Creatinine Equation (2021)    Anion gap 14 5 - 15    Comment: Performed at [facility], [address], [place], [place] [address]  Troponin I (High Sensitivity)     Status: Abnormal   Collection Time: 06/07/23  6:30 AM  Result Value Ref Range   Troponin I (High Sensitivity) 95 (H) <18 ng/L    Comment: (NOTE) Elevated high sensitivity troponin I (hsTnI) values and significant  changes across serial measurements may suggest ACS but many other  chronic and acute conditions are known to elevate hsTnI results.  Refer to the "Links" section for chest pain algorithms and additional  guidance. Performed at [facility], [address] [place], [place] [address]   Brain natriuretic peptide     Status: Abnormal   Collection Time: 06/07/23  6:30 AM  Result Value Ref Range   B Natriuretic Peptide  967.4 (H) 0.0 - 100.0 pg/mL    Comment: Performed at [facility], [address] [place], [place] [address]  I-Stat CG4 Lactic Acid     Status: Abnormal   Collection Time: 06/07/23  6:39 AM  Result Value Ref Range   Lactic Acid, Venous 3.5 (HH) 0.5 - 1.9 mmol/L   Comment NOTIFIED PHYSICIAN   Culture, blood (single)     Status: None (Preliminary result)   Collection Time: 06/07/23  7:01 AM   Specimen: BLOOD  Result Value Ref Range   Specimen Description BLOOD BLOOD LEFT HAND    Special Requests      AEROBIC BOTTLE ONLY Blood Culture results may not be optimal due to an inadequate volume of blood received in culture bottles   Culture      NO GROWTH <12 HOURS Performed at [facility], [address], [place], [place] [address]    Report Status PENDING   Troponin I (High Sensitivity)     Status: Abnormal   Collection Time: 06/07/23  8:22 AM  Result Value Ref Range   Troponin I (High Sensitivity) 169 (HH) <18 ng/L    Comment: CRITICAL RESULT CALLED TO, READ BACK BY AND VERIFIED WITH Lew Dawes, RN [phone] 06/07/23 ALTOM,A (NOTE) Elevated high sensitivity troponin I (hsTnI) values and significant  changes across serial measurements may suggest ACS but many other  chronic and acute conditions are known to elevate hsTnI results.  Refer to the "Links" section for chest pain algorithms and additional  guidance. Performed at [facility], [address], [place], [place] [address]   I-Stat CG4 Lactic Acid     Status: None   Collection Time: 06/07/23  9:05 AM  Result Value Ref Range   Lactic Acid, Venous 1.3 0.5 - 1.9 mmol/L   DG Chest [facility]  1 View  Result Date: 06/07/2023 CLINICAL DATA:  Shortness of breath EXAM: PORTABLE CHEST 1 VIEW COMPARISON:  11/28/2020 FINDINGS: Hazy opacification of the bilateral chest. Cardiomediastinal contours are distorted by rotation with accentuated ascending aorta and foreshortening of the cardiac size. No visible effusion or pneumothorax.  IMPRESSION: Extensive and bilateral airspace disease favoring edema over infection. Electronically Signed   By: Tiburcio Pea M.D.   On: 06/07/2023 06:40    Pending Labs Unresulted Labs (From admission, onward)     Start     Ordered   06/08/23 0500  CBC  Tomorrow morning,   R        06/07/23 1041   06/08/23 0500  Basic metabolic panel  Tomorrow morning,   R        06/07/23 1041   06/07/23 1042  Magnesium  Once,   R        06/07/23 1041   06/07/23 1042  Phosphorus  Once,   R        06/07/23 1041   06/07/23 1041  Iron and TIBC  Once,   R        06/07/23 1041   06/07/23 1041  Ferritin  Once,   R        06/07/23 1041   06/07/23 1041  Vitamin B12  Once,   R        06/07/23 1041   06/07/23 1041  VITAMIN D 25 Hydroxy (Vit-D Deficiency, Fractures)  Once,   R        06/07/23 1041            Vitals/Pain Today's Vitals   06/07/23 0645 06/07/23 0731 06/07/23 0732 06/07/23 0830  BP: (!) 166/61 (!) 145/77  (!) 155/74  Pulse: 92 90  (!) 106  Resp: (!) 27 20  (!) 27  Temp:      TempSrc:      SpO2: 96% 98%  (!) 89%  Weight:      Height:      PainSc:   0-No pain     Isolation Precautions No active isolations  Medications Medications  enoxaparin (LOVENOX) injection 30 mg (has no administration in time range)  acetaminophen (TYLENOL) tablet 650 mg (has no administration in time range)    Or  acetaminophen (TYLENOL) suppository 650 mg (has no administration in time range)  senna-docusate (Senokot-S) tablet 1 tablet (has no administration in time range)  ondansetron (ZOFRAN) tablet 4 mg (has no administration in time range)    Or  ondansetron (ZOFRAN) injection 4 mg (has no administration in time range)  amLODipine (NORVASC) tablet 5 mg (has no administration in time range)  atorvastatin (LIPITOR) tablet 40 mg (has no administration in time range)  carvedilol (COREG) tablet 3.125 mg (has no administration in time range)  levothyroxine (SYNTHROID) tablet 50 mcg (has no  administration in time range)  pantoprazole (PROTONIX) EC tablet 40 mg (has no administration in time range)  umeclidinium bromide (INCRUSE ELLIPTA) 62.5 MCG/ACT 1 puff (has no administration in time range)  ipratropium-albuterol (DUONEB) 0.5-2.5 (3) MG/3ML nebulizer solution 3 mL (has no administration in time range)  predniSONE (DELTASONE) tablet 40 mg (has no administration in time range)  furosemide (LASIX) injection 40 mg (has no administration in time range)  leflunomide (ARAVA) tablet 20 mg (has no administration in time range)  venlafaxine XR (EFFEXOR-XR) 24 hr capsule 150 mg (has no administration in time range)  hydroxyurea (HYDREA) capsule 500 mg (has no administration in time range)  folic acid (FOLVITE) tablet 1 mg (has no administration in time range)  aspirin EC tablet 81 mg (has no administration in time range)  melatonin tablet 5 mg (has no administration in time range)  ipratropium-albuterol (DUONEB) 0.5-2.5 (3) MG/3ML nebulizer solution (  Given 06/07/23 0635)  levofloxacin (LEVAQUIN) IVPB 750 mg (0 mg Intravenous Stopped 06/07/23 0914)  furosemide (LASIX) injection 40 mg (40 mg Intravenous Given 06/07/23 0742)    Mobility walks     Focused Assessments Pulmonary Assessment Handoff:  Lung sounds: Bilateral Breath Sounds: Rales L Breath Sounds: Rales R Breath Sounds: Rales O2 Device: Bi-PAP      R Recommendations: See Admitting Provider Note  Report given to:   Additional Notes:  Patient was taken off Bipap @820  am Patient states she feels so much better alert oriented. Patient on purewick.

## 2023-06-07 NOTE — ED Notes (Signed)
Admitting MD aware of trop 169

## 2023-06-07 NOTE — Consult Note (Addendum)
Cardiology Consultation   Patient ID: [name] MRN: [id number]; DOB: 1945/11/02  Admit date: 06/07/2023 Date of Consult: 06/07/2023  PCP:  Alexis Floro, MD    [facility] Providers Cardiologist:  Alexis Nose, MD      Patient Profile:   Alexis Murphy is a 77 y.o. female with a hx of pulmonary sarcoidosis, CKD stage IIIb, osteoarthritis, HTN, HLD, hypothyroidism, chronic thrombocytopenia who is being seen 06/07/2023 for the evaluation of new CHF at the request of Alexis Murphy.  History of Present Illness:   Alexis Murphy is a 77 year old female with above medical history. Patient was first seen by cardiology in 07/2019. At that time, patient was admitted with pneumonia, bacteremia, sepsis. Found to have atrial fibrillation with RVR. She was started on amiodarone and diltiazem, and she converted to NSR. Note, she was not started on anticoagulation due to her thrombocytopenia. Echocardiogram on 07/30/19 showed EF 55-60%, moderately increased LV posterior wall thickness, no wall motion abnormalities, normal RV function, mild MR, mild AI. It was thought that her atrial fibrillation was secondary to sepsis. There were plans to monitor for recurrence as an outpatient and to consider anticoagulation if there was recurrence. However, patient did not follow up with cardiology as an outpatient.   Patient presented to the ED on 11/29 complaining of shortness of breath. Patient had woken up short of breath and used her inhaler multiple times without relief. She called EMS. When EMS arrived, her oxygen was in the 80s on room air and she was started on Bi-PAP. In the ED, initial vital signs showed oxygen 95% on Bi-PAP, BP 160/77, HR 99 BPM. Labs significant for creatinine 2.07, WBC 14.3, hemoglobin 10, platelets 579. Lactic acid 3.5>1.3. hsTn 95>169. BNP 967.4. CXR showed extensive and bilateral airspace disease favoring edema over infection.   Echocardiogram showed EF 20-25%, mild  LVH, normal RV function, mild-moderate MR, moderate-severe AI.   Cardiology was consulted for newly reduced EF, moderate-severe AI   On interview, patient reports that about 1 week ago, she received an injection in her knee for arthritis. After she got the injection, she ate [place] food and started to feel "bad". Reports feeling very fatigued and like she had to lay down. She laid down to rest, and started to vomit and have diarrhea. Her vomiting and diarrhea stopped, but she continued to feel very fatigued. She spent the next 2-3 days mostly laying in bed. She was able to get up to move around her house, but she continued to feel fatigued. She denies any shortness of breath, chest pain, ankle swelling. She denies cough. Alexis Murphy, she was able to cook Thanksgiving dinner with a bit of help from her son. Denies chest pain or shortness of breath while working in the kitchen.   This AM, she woke up around 5 AM feeling like she could not breathe. She reports feeling like she "could not get air in". She tried to use her inhalers without improvement in symptoms. Also stood up and tried to walk around and change positions without improvement in symptoms. She woke her son up who called EMS. Notes that since her arrival to the ED, her breathing has improved. She denies any chest pain throughout this morning. Notes that she has never had significant chest pain   Past Medical History:  Diagnosis Date   Arthritis    Hyperlipidemia    Sarcoidosis     Past Surgical History:  Procedure Laterality Date   ABDOMINAL  HYSTERECTOMY     BREAST BIOPSY     BREAST SURGERY     REDUCTION MAMMAPLASTY       Home Medications:  Prior to Admission medications   Medication Sig Start Date End Date Taking? Authorizing Provider  acetaminophen (TYLENOL) 325 MG tablet Take 1 tablet (325 mg total) by mouth every 6 (six) hours. Patient taking differently: Take 325 mg by mouth every 6 (six) hours as needed for headache. 03/10/17   Yes Caccavale, Sophia, PA-C  albuterol (VENTOLIN HFA) 108 (90 Base) MCG/ACT inhaler Inhale 2 puffs into the lungs every 4 (four) hours as needed.   Yes [provider]  amLODipine (NORVASC) 5 MG tablet Take 5 mg by mouth every evening.  08/27/16  Yes [provider]  aspirin EC 81 MG tablet Take 81 mg by mouth daily.   Yes [provider]  atorvastatin (LIPITOR) 80 MG tablet Take 80 mg by mouth every evening.   Yes [provider]  carvedilol (COREG) 3.125 MG tablet Take 3.125 mg by mouth 2 (two) times daily with a meal.   Yes [provider]  Cholecalciferol (VITAMIN D3) 25 MCG (1000 UT) CAPS TAKE TWO (2) CAPSULES BY MOUTH ONCE DAILY 04/04/23  Yes Johney Maine, MD  folic acid (FOLVITE) 1 MG tablet TAKE 1 TABLET BY MOUTH ONCE DAILY 04/04/23  Yes Johney Maine, MD  hydroxyurea (HYDREA) 500 MG capsule TAKE ONE CAPSULE BY MOUTH ON DAYS MONDAY-THURSDAY. DO not take friday, saturday, OR sunday Patient taking differently: Take 500 mg by mouth daily. Take 1 capsule by mouth on Monday, Wednesday, and Friday. Patient stated if her Dr tells her to take 4 times weekly Patient will take Monday-Thursday. 04/12/23  Yes Johney Maine, MD  iron polysaccharides (NIFEREX) 150 MG capsule TAKE ONE CAPSULE BY MOUTH ONCE DAILY 08/15/22  Yes Johney Maine, MD  leflunomide (ARAVA) 20 MG tablet Take 20 mg by mouth daily.   Yes [provider]  levothyroxine (SYNTHROID) 50 MCG tablet Take 50 mcg by mouth every morning. 12/14/19  Yes [provider]  Melatonin 5 MG TABS Take 5 mg by mouth at bedtime.   Yes [provider]  omeprazole (PRILOSEC) 40 MG capsule Take 40 mg by mouth every evening.    Yes [provider]  predniSONE (DELTASONE) 5 MG tablet Take 5 mg by mouth daily.   Yes [provider]  venlafaxine XR (EFFEXOR-XR) 150 MG 24 hr capsule Take 150 mg by mouth every evening.  08/27/16  Yes [provider]    Inpatient Medications: Scheduled Meds:  amLODipine  5 mg Oral QPM   aspirin EC  81 mg Oral Daily   atorvastatin  40 mg Oral QPM   carvedilol  3.125 mg Oral BID WC   enoxaparin (LOVENOX) injection  30 mg Subcutaneous Q24H   folic acid  1 mg Oral Daily   [START ON 06/08/2023] furosemide  40 mg Intravenous Daily   hydroxyurea  500 mg Oral Daily   leflunomide  20 mg Oral Daily   [START ON 06/08/2023] levothyroxine  50 mcg Oral Q24H   melatonin  5 mg Oral QHS   pantoprazole  40 mg Oral Daily   [START ON 06/08/2023] predniSONE  40 mg Oral Q breakfast   umeclidinium bromide  1 puff Inhalation Daily   venlafaxine XR  150 mg Oral QPM   Continuous Infusions:  PRN Meds: acetaminophen **OR** acetaminophen, ipratropium-albuterol, ondansetron **OR** ondansetron (ZOFRAN) IV, perflutren lipid microspheres (  DEFINITY) IV suspension, senna-docusate  Allergies:    Allergies  Allergen Reactions   Penicillins Anaphylaxis and Rash    Did it involve swelling of the face/tongue/throat, SOB, or low BP? Yes  Did it involve sudden or severe rash/hives, skin peeling, or any reaction on the inside of your mouth or Murphy? No Did you need to seek medical attention at a hospital or doctor's office? No When did it last happen? Childhood If all above answers are "NO", may proceed with cephalosporin use.    Codeine Other (See Comments)    Vomiting    Pentazocine     Other reaction(s): itch    Social History:   Social History   Socioeconomic History   Marital status: Widowed    Spouse name: Not on file   Number of children: Not on file   Years of education: Not on file   Highest education level: Not on file  Occupational History   Not on file  Tobacco Use   Smoking status: Former    Current packs/day: 0.00    Average packs/day: 2.0 packs/day for 45.0 years (90.0 ttl pk-yrs)    Types: Cigarettes    Start date: 10/15/1962    Quit date: 10/15/2007    Years since quitting: 15.6   Smokeless  tobacco: Former  Building services engineer status: Never Used  Substance and Sexual Activity   Alcohol use: Yes   Drug use: No   Sexual activity: Not on file  Other Topics Concern   Not on file  Social History Narrative   Not on file   Social Determinants of Health   Financial Resource Strain: Not on file  Food Insecurity: No Food Insecurity (06/07/2023)   Hunger Vital Sign    Worried About Running Out of Food in the Last Year: Never true    Ran Out of Food in the Last Year: Never true  Transportation Needs: No Transportation Needs (06/07/2023)   PRAPARE - Administrator, Civil Service (Medical): No    Lack of Transportation (Non-Medical): No  Physical Activity: Not on file  Stress: Not on file  Social Connections: Not on file  Intimate Partner Violence: Not At Risk (06/07/2023)   Humiliation, Afraid, Rape, and Kick questionnaire    Fear of Current or Ex-Partner: No    Emotionally Abused: No    Physically Abused: No    Sexually Abused: No    Family History:    Family History  Problem Relation Age of Onset   Heart disease Father    Heart disease Brother      ROS:  Please see the history of present illness.   All other ROS reviewed and negative.     Physical Exam/Data:   Vitals:   06/07/23 0645 06/07/23 0731 06/07/23 0830 06/07/23 1142  BP: (!) 166/61 (!) 145/77 (!) 155/74   Pulse: 92 90 (!) 106   Resp: (!) 27 20 (!) 27   Temp:    97.9 F (36.6 C)  TempSrc:    Oral  SpO2: 96% 98% (!) 89%   Weight:      Height:       No intake or output data in the 24 hours ending 06/07/23 1313    06/07/2023    6:39 AM 05/13/2023    2:00 PM 02/06/2023   12:21 PM  Last 3 Weights  Weight (lbs) 121 lb 4.1 oz 121 lb 8 oz 119 lb 6.4 oz  Weight (kg) 55 kg 55.112  kg 54.159 kg     Body mass index is 24.49 kg/m.  General:  Thin, chronically ill female. Laying flat in the bed in no acute distress  HEENT: normal Neck: no JVD Vascular: Radial pulses 2+  bilaterally Cardiac:  normal S1, S2; RRR; grade 2/6 diastolic murmur  Lungs: crackles in lung bases, end expiratory wheezing heard throughout  Abd: soft, nontender  Ext: no edema in BLE  Musculoskeletal:  No deformities, BUE and BLE strength normal and equal Skin: warm and dry  Neuro:  CNs 2-12 intact, no focal abnormalities noted Psych:  Normal affect   EKG:  The EKG was personally reviewed and demonstrates: Initial EKG showed sinus tachycardia, HR 100 BPM, PVCs, LBBB Telemetry:  Telemetry was personally reviewed and demonstrates:  No telemetry available on 2W   Relevant CV Studies:  Cardiac Studies & Procedures       ECHOCARDIOGRAM  ECHOCARDIOGRAM COMPLETE 06/07/2023  Narrative ECHOCARDIOGRAM REPORT    Patient Name:   GEORGENA UNSELL Date of Exam: 06/07/2023 Medical Rec #:  [id number]       Height:       59.0 in Accession #:    [id number]      Weight:       121.3 lb Date of Birth:  Nov 09, 1945       BSA:          1.491 m Patient Age:    77 years        BP:           155/74 mmHg Patient Gender: F               HR:           113 bpm. Exam Location:  Inpatient  Procedure: 2D Echo, Color Doppler, Cardiac Doppler and Intracardiac Opacification Agent  Indications:     Dyspnea  History:         Patient has prior history of Echocardiogram examinations, most recent 07/30/2019.  Sonographer:     Darlys Gales Referring Phys:  [id number] PROSPER M AMPONSAH Diagnosing Phys: [place]  IMPRESSIONS   1. 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 The left ventricle demonstrates global hypokinesis. The left ventricular internal cavity size was mildly dilated. There is mild left ventricular hypertrophy. Left ventricular diastolic parameters are indeterminate. 2. Right ventricular systolic function is normal. The right ventricular size is normal. There is normal pulmonary artery systolic  pressure. The estimated right ventricular systolic pressure is 16.8 mmHg. 3. Left atrial size was severely dilated. 4. The mitral valve is degenerative. Mild to moderate mitral valve regurgitation. 5. The aortic valve was not well visualized. Aortic valve regurgitation is moderate to severe. Mild aortic valve stenosis. 6. Not well visualized. 7. The inferior vena cava is normal in size with greater than 50% respiratory variability, suggesting right atrial pressure of 3 mmHg.  Conclusion(s)/Recommendation(s): Compared to prior echo, EF has declined. AI is at least moderate. Recommend CT chest to rule out dissection, the ascending aorta was not well visualized.  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 The left ventricle demonstrates global hypokinesis. The left ventricular internal cavity size was mildly dilated. There is mild left ventricular hypertrophy. Left ventricular diastolic parameters are indeterminate.  Right Ventricle: The right ventricular size is normal. Right ventricular systolic function is normal. There is  normal pulmonary artery systolic pressure. The tricuspid regurgitant velocity is 1.86 m/s, and with an assumed right atrial pressure of 3 mmHg, the estimated right ventricular systolic pressure is 16.8 mmHg.  Left Atrium: Left atrial size was severely dilated.  Right Atrium: Right atrial size was normal in size.  Pericardium: There is no evidence of pericardial effusion.  Mitral Valve: The mitral valve is degenerative in appearance. Mild to moderate mitral valve regurgitation.  Tricuspid Valve: Tricuspid valve regurgitation is trivial.  Aortic Valve: The aortic valve was not well visualized. Aortic valve regurgitation is moderate to severe. Aortic regurgitation PHT measures 250 msec. Mild aortic stenosis is present. Aortic valve mean gradient measures 13.0  mmHg. Aortic valve peak gradient measures 21.0 mmHg. Aortic valve area, by VTI measures 1.08 cm.  Aorta: Not well visualized.  Venous: The inferior vena cava is normal in size with greater than 50% respiratory variability, suggesting right atrial pressure of 3 mmHg.  IAS/Shunts: The interatrial septum was not well visualized.   LEFT VENTRICLE PLAX 2D LVIDd:         5.10 cm LVIDs:         4.40 cm LV PW:         1.00 cm LV IVS:        1.10 cm LVOT diam:     1.80 cm LV SV:         43 LV SV Index:   29 LVOT Area:     2.54 cm   RIGHT VENTRICLE RV S prime:     13.30 cm/s TAPSE (M-mode): 2.1 cm  LEFT ATRIUM           Index        RIGHT ATRIUM           Index LA Vol (A4C): 67.4 ml 45.20 ml/m  RA Area:     11.00 cm RA Volume:   20.50 ml  13.75 ml/m AORTIC VALVE AV Area (Vmax):    1.10 cm AV Area (Vmean):   1.03 cm AV Area (VTI):     1.08 cm AV Vmax:           229.00 cm/s AV Vmean:          172.000 cm/s AV VTI:            0.397 m AV Peak Grad:      21.0 mmHg AV Mean Grad:      13.0 mmHg LVOT Vmax:         99.00 cm/s LVOT Vmean:        69.900 cm/s LVOT VTI:          0.168 m LVOT/AV VTI ratio: 0.42 AI PHT:            250 msec  MITRAL VALVE                TRICUSPID VALVE MV Area (PHT): 4.68 cm     TR Peak grad:   13.8 mmHg MV Decel Time: 162 msec     TR Vmax:        186.00 cm/s MV E velocity: 161.00 cm/s SHUNTS Systemic VTI:  0.17 m Systemic Diam: 1.80 cm  Carolan Clines Electronically signed by Carolan Clines Signature Date/Time: 06/07/2023/11:58:50 AM    Final (Updated)              Laboratory Data:  High Sensitivity Troponin:   Recent Labs  Lab 06/07/23 0630 06/07/23 0822  TROPONINIHS 95* 169*     Chemistry  Recent Labs  Lab 06/07/23 0630  NA 140  K 3.7  CL 112*  CO2 14*  GLUCOSE 231*  BUN 36*  CREATININE 2.07*  CALCIUM 8.5*  GFRNONAA 24*  ANIONGAP 14    Recent Labs  Lab 06/07/23 0630  PROT 6.2*  ALBUMIN 3.4*  AST 42*  ALT 25   ALKPHOS 76  BILITOT 0.4   Lipids No results for input(s): "CHOL", "TRIG", "HDL", "LABVLDL", "LDLCALC", "CHOLHDL" in the last 168 hours.  Hematology Recent Labs  Lab 06/07/23 0630  WBC 14.3*  RBC 3.39*  HGB 10.0*  HCT 33.0*  MCV 97.3  MCH 29.5  MCHC 30.3  RDW 14.4  PLT 579*   Thyroid No results for input(s): "TSH", "FREET4" in the last 168 hours.  BNP Recent Labs  Lab 06/07/23 0630  BNP 967.4*    DDimer No results for input(s): "DDIMER" in the last 168 hours.   Radiology/Studies:  ECHOCARDIOGRAM COMPLETE  Result Date: 06/07/2023    ECHOCARDIOGRAM REPORT   Patient Name:   MERLEEN DICE Date of Exam: 06/07/2023 Medical Rec #:  [id number]       Height:       59.0 in Accession #:    [id number]      Weight:       121.3 lb Date of Birth:  April 08, 1946       BSA:          1.491 m Patient Age:    77 years        BP:           155/74 mmHg Patient Gender: F               HR:           113 bpm. Exam Location:  Inpatient Procedure: 2D Echo, Color Doppler, Cardiac Doppler and Intracardiac            Opacification Agent Indications:     Dyspnea  History:         Patient has prior history of Echocardiogram examinations, most                  recent 07/30/2019.  Sonographer:     Darlys Gales Referring Phys:  [id number] PROSPER M AMPONSAH Diagnosing Phys: [place] IMPRESSIONS  1. 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 The left ventricle demonstrates global hypokinesis. The left ventricular internal cavity size was mildly dilated. There is mild left ventricular hypertrophy. Left ventricular diastolic parameters are indeterminate.  2. Right ventricular systolic function is normal. The right ventricular size is normal. There is normal pulmonary artery systolic pressure. The estimated right ventricular systolic pressure is 16.8 mmHg.  3. Left atrial size was severely dilated.  4. The mitral valve is  degenerative. Mild to moderate mitral valve regurgitation.  5. The aortic valve was not well visualized. Aortic valve regurgitation is moderate to severe. Mild aortic valve stenosis.  6. Not well visualized.  7. The inferior vena cava is normal in size with greater than 50% respiratory variability, suggesting right atrial pressure of 3 mmHg. Conclusion(s)/Recommendation(s): Compared to prior echo, EF has declined. AI is at least moderate. Recommend CT chest to rule out dissection, the ascending aorta was not well visualized. 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 The left ventricle demonstrates global hypokinesis. The  left ventricular internal cavity size was mildly dilated. There is mild left ventricular hypertrophy. Left ventricular diastolic parameters are indeterminate. Right Ventricle: The right ventricular size is normal. Right ventricular systolic function is normal. There is normal pulmonary artery systolic pressure. The tricuspid regurgitant velocity is 1.86 m/s, and with an assumed right atrial pressure of 3 mmHg,  the estimated right ventricular systolic pressure is 16.8 mmHg. Left Atrium: Left atrial size was severely dilated. Right Atrium: Right atrial size was normal in size. Pericardium: There is no evidence of pericardial effusion. Mitral Valve: The mitral valve is degenerative in appearance. Mild to moderate mitral valve regurgitation. Tricuspid Valve: Tricuspid valve regurgitation is trivial. Aortic Valve: The aortic valve was not well visualized. Aortic valve regurgitation is moderate to severe. Aortic regurgitation PHT measures 250 msec. Mild aortic stenosis is present. Aortic valve mean gradient measures 13.0 mmHg. Aortic valve peak gradient measures 21.0 mmHg. Aortic valve area, by VTI measures 1.08 cm. Aorta: Not well visualized. Venous: The inferior vena cava is normal in  size with greater than 50% respiratory variability, suggesting right atrial pressure of 3 mmHg. IAS/Shunts: The interatrial septum was not well visualized.  LEFT VENTRICLE PLAX 2D LVIDd:         5.10 cm LVIDs:         4.40 cm LV PW:         1.00 cm LV IVS:        1.10 cm LVOT diam:     1.80 cm LV SV:         43 LV SV Index:   29 LVOT Area:     2.54 cm  RIGHT VENTRICLE RV S prime:     13.30 cm/s TAPSE (M-mode): 2.1 cm LEFT ATRIUM           Index        RIGHT ATRIUM           Index LA Vol (A4C): 67.4 ml 45.20 ml/m  RA Area:     11.00 cm                                    RA Volume:   20.50 ml  13.75 ml/m  AORTIC VALVE AV Area (Vmax):    1.10 cm AV Area (Vmean):   1.03 cm AV Area (VTI):     1.08 cm AV Vmax:           229.00 cm/s AV Vmean:          172.000 cm/s AV VTI:            0.397 m AV Peak Grad:      21.0 mmHg AV Mean Grad:      13.0 mmHg LVOT Vmax:         99.00 cm/s LVOT Vmean:        69.900 cm/s LVOT VTI:          0.168 m LVOT/AV VTI ratio: 0.42 AI PHT:            250 msec MITRAL VALVE                TRICUSPID VALVE MV Area (PHT): 4.68 cm     TR Peak grad:   13.8 mmHg MV Decel Time: 162 msec     TR Vmax:        186.00 cm/s MV E velocity: 161.00 cm/s  SHUNTS                             Systemic VTI:  0.17 m                             Systemic Diam: 1.80 cm Carolan Clines Electronically signed by Carolan Clines Signature Date/Time: 06/07/2023/11:58:50 AM    Final (Updated)    DG Chest Port 1 View  Result Date: 06/07/2023 CLINICAL DATA:  Shortness of breath EXAM: PORTABLE CHEST 1 VIEW COMPARISON:  11/28/2020 FINDINGS: Hazy opacification of the bilateral chest. Cardiomediastinal contours are distorted by rotation with accentuated ascending aorta and foreshortening of the cardiac size. No visible effusion or pneumothorax. IMPRESSION: Extensive and bilateral airspace disease favoring edema over infection. Electronically Signed   By: Tiburcio Pea M.D.   On: 06/07/2023 06:40      Assessment and Plan:   Acute Hypoxic Respiratory Failure  Acute Systolic Heart Failure  Moderate-Severe AI  - Patient arrived with acute onset shortness of breath, hypoxia. BNP elevated to 967. CXR with extensive bilateral airspace disease favoring edema over infection. Echocardiogram showed EF 20-25%, mild LVH, normal RV function, normal RV function, mild-moderate MR, moderate-severe AI  - Patient on IV lasix 40 mg daily- I/Os not recorded. Patient reports significant urine output - Continue IV lasix 40 mg daily for now. BMP in AM to check kidney function. - Creatinine currently elevated to 2.07. Appears baseline creatinine is around 1.5-1.6. Renal function limits GDMT  - Continue carvedilol 3.125 mg BID (was on prior to admission). BP elevated to 153/80 - Start hydralazine 25 mg TID  -  Echocardiogram did not visualize aorta well- recommended consideration of CT chest to rule out dissection. CTA chest/abd/pelv ordered by primary and showed no evidence of dissection or aneurysm, patchy airspace opacities in both upper lobes consistent with multifocal PNA or possibly edema - Suspect respiratory failure is multifactorial and due to CHF, COPD exacerbation, possible PNA.  - Could consider cardiac catheterization when improved from respiratory standpoint and if renal function allows. However, CTA chest showed only mild coronary calcifications and patient has not had any recent angina. Possible that patient has a stress cardiomyopathy   PVCs  - EKG from today at [phone] showed PVCs with bigeminy - No telemetry available on 2w, recommend transfer to cardiac telemetry  - Continue carvedilol  - Mag 2.4, K 3.7   Elevated Troponin  - hsTn [id number] - EKG shows new LBBB (previous EKG was in 2021)  - Patient denies chest pain. Suspect that her trop elevation is demand ischemia in the setting of shortness of breath and hypoxia  - CTA chest showed only mild coronary artery calcifications - As above,  consider ischemic evaluation when renal function improves and COPD exacerbation/possible PNA resolves   Otherwise per primary  - COPD exacerbation  - Pulmonary sarcoidosis  - Normocytic anemia  - Essential thrombocytosis  - Hypothyroidism    Risk Assessment/Risk Scores:  746}      New York Heart Association (NYHA) Functional Class NYHA Class IV    For questions or updates, please contact [facility] Please consult [email or web address] for contact info under    Signed, Jonita Albee, PA-C  06/07/2023 1:13 PM  Patient seen, examined. Available data reviewed. Agree with findings, assessment, and plan as outlined by Marijean Niemann, PA-C.  The patient's son is at the bedside.  She is alert, oriented, in no distress.  She is an elderly appearing somewhat chronically ill woman.  HEENT is normal, JVP is normal, lungs have scattered rhonchi and prolonged expiration.  Chest is barrel-shaped consistent with COPD.  Heart sounds are distant but the heart is regular rate and rhythm with a 2/6 systolic murmur best heard at the apex.  Abdomen is soft and nontender, extremities have no edema.  The patient's telemetry is reviewed and shows sinus rhythm with frequent PVCs and supraventricular runs.  Her echocardiogram is reviewed and shows severe LV dysfunction with LVEF less than 25%.  She has moderate to severe aortic valve insufficiency.  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  The patient has a long smoking history but quit over a decade ago.  She has not been on home oxygen previously.  She does have chronic shortness of breath but her symptoms have progressed over the past 2 to 3 weeks.  She appears to have had a good initial response to IV diuretic therapy.  Would continue IV diuresis cautiously in the setting of acute on chronic kidney disease.  Differential diagnosis for the  patient's acute change in LV function could be related to ischemic heart disease or acute stress cardiomyopathy more likely.  She also was found to have at least moderate aortic insufficiency and this could possibly play a role in her LV dysfunction.  A CTA is completed and demonstrates no acute aortic pathology.  Incidentally, she is found to have bilateral pleural effusions and patchy airspace disease in the upper lobes which could be consistent with multifocal pneumonia or pulmonary edema.  GDMT is limited in the setting of her acute on chronic kidney injury.  Would continue carvedilol as she has plenty of blood pressure to tolerate this.  Continue IV diuresis and follow creatinine closely.  I do not think she is a very good candidate for invasive cardiac evaluation and I would probably hold off on cardiac catheterization unless her LV function has not shown to improve on follow-up imaging in a few months.  Our team will follow.  Tonny Bollman, M.D. 06/07/2023 4:20 PM

## 2023-06-07 NOTE — ED Provider Notes (Signed)
I was asked to follow-up on the patient's labs and reassess the patient after she has had some time on BiPAP for disposition.  On reassessment the patient states she is feeling much better on BiPAP and her pulse ox is in the high 90s. Physical Exam  BP (!) 145/77 (BP Location: Right Arm)   Pulse 90   Temp (!) 97.5 F (36.4 C) (Axillary)   Resp 20   Ht 4\' 11"  (1.499 m)   Wt 55 kg   SpO2 98%   BMI 24.49 kg/m   Physical Exam Gen: NAD, breathing comfortably on BiPAP Resp: clear to auscultation bilaterally Card: RRR, no murmurs, rubs, or gallops   Procedures  Procedures  ED Course / MDM    Medical Decision Making Amount and/or Complexity of Data Reviewed Labs: ordered. Radiology: ordered.  Risk Prescription drug management.   The patient's labs are largely reassuring.  She does have a leukocytosis and was covered with Levaquin.  The patient's x-ray does show some findings concerning for pulmonary edema.  She is given a dose of Lasix here as well.  Her initial troponin came back at 95.  I think this is likely secondary to either CHF or possible pneumonia.  A call was placed to hospitalist service for admission.  Suspicion for NSTEMI is low as patient is doing better on BiPAP is denying any chest pain currently.       Durwin Glaze, MD 06/07/23 [phone]

## 2023-06-07 NOTE — Progress Notes (Signed)
Pt taken of BiPAP and placed on 3L [place] by RT. Pt tolerating well at this time, no increased WOB noted, vitals stable, SPO2 100%, MD notified, RT will monitor as needed.     06/07/23 0800  Therapy Vitals  Pulse Rate 88  Resp (!) 23  BP (!) 112/92  MEWS Score/Color  MEWS Score 1  MEWS Score Color Green  Respiratory Assessment  Assessment Type Assess only  Respiratory Pattern Regular;Unlabored  Chest Assessment Chest expansion symmetrical  Cough None  Bilateral Breath Sounds Diminished  Oxygen Therapy/Pulse Ox  O2 Device Nasal Cannula  O2 Flow Rate (L/min) 3 L/min  SpO2 100 %

## 2023-06-07 NOTE — Progress Notes (Signed)
[facility] following for sepsis protocol. 

## 2023-06-07 NOTE — Progress Notes (Signed)
Patient has arrived to the unit via stretcher from ED. A/O x 4. No pain complained at this time. Patient is on 3 lit [place] which is not her Base line. Placed on tele ST.  Patient has a yellow MEWS and MD aware about that. No new order received at this time. Oriented patient to the room and staff. Education provided regarding safety precaution and patient verbalize understanding.

## 2023-06-07 NOTE — H&P (Signed)
History and Physical    Patient: Alexis Murphy ZOX:[id number] DOB: February 21, 1946 DOA: 06/07/2023 DOS: the patient was seen and examined on 06/07/2023 PCP: Daisy Floro, MD  Patient coming from: Home  Chief Complaint:  Chief Complaint  Patient presents with   Respiratory Distress   HPI: Alexis Murphy is a 77 y.o. female with medical history significant of sarcoidosis, essential thrombocytosis, CKD 3B, osteoarthritis, HTN, HLD and hypothyroidism who presented via EMS for evaluation of shortness of breath.  Patient reports that she woke up with difficulty breathing. She tried her albuterol inhaler multiple times without relief. States she just could not get any breath in. She reports wheezing but denied any chest pain, fevers, chills, palpitations, headaches, abdominal pain, orthopnea or leg swelling. She has also had some mild dry cough.  She called EMS and on arrival she was found to be hypoxic with SpO2 of 80%.  She was placed on CPAP and given 7.5 albuterol, DuoNebs, IV mag 2 g, IV Solu-Medrol 125 mg and 0.4 sublingual nitrate.  ED course:  Vitals showed hypertensive with SBP in the 140s to 160s, mild tachycardia with HR in the 90s to 100s and tachypneic with RR in the 20s. Placed on BiPAP with FiO2 of 70%. Labs showed sodium 140, K+ 3.7, bicarb 14, glucose 231, BUN/creatinine 36/2.07, WBC 14.3, Hgb 10.0, platelet 579, BNP 967, lactic acid 3.5-1.3, troponin 95-1 69, negative flu and COVID test. CXR showed extensive bilateral airspace disease concerning for edema. Received IV Lasix 40 mg x 1 and IV Levaquin 750 mg for possible pneumonia in the ED TRH consulted for admission   Review of Systems: As mentioned in the history of present illness. All other systems reviewed and are negative. Past Medical History:  Diagnosis Date   Arthritis    Hyperlipidemia    Sarcoidosis    Past Surgical History:  Procedure Laterality Date   ABDOMINAL HYSTERECTOMY     BREAST BIOPSY     BREAST  SURGERY     REDUCTION MAMMAPLASTY     Social History:  reports that she quit smoking about 15 years ago. Her smoking use included cigarettes. She started smoking about 60 years ago. She has a 90 pack-year smoking history. She has quit using smokeless tobacco. She reports current alcohol use. She reports that she does not use drugs.  Allergies  Allergen Reactions   Penicillins Anaphylaxis and Rash    Did it involve swelling of the face/tongue/throat, SOB, or low BP? Yes  Did it involve sudden or severe rash/hives, skin peeling, or any reaction on the inside of your mouth or nose? No Did you need to seek medical attention at a hospital or doctor's office? No When did it last happen? Childhood If all above answers are "NO", may proceed with cephalosporin use.    Codeine Other (See Comments)    Vomiting    Pentazocine     Other reaction(s): itch    Family History  Problem Relation Age of Onset   Heart disease Father    Heart disease Brother     Prior to Admission medications   Medication Sig Start Date End Date Taking? Authorizing Provider  acetaminophen (TYLENOL) 325 MG tablet Take 1 tablet (325 mg total) by mouth every 6 (six) hours. Patient taking differently: Take 325 mg by mouth every 6 (six) hours as needed for headache. 03/10/17   Caccavale, Sophia, PA-C  amLODipine (NORVASC) 5 MG tablet Take 5 mg by mouth every evening.  08/27/16  [provider]  aspirin EC 81 MG tablet Take 81 mg by mouth daily.    [provider]  atorvastatin (LIPITOR) 40 MG tablet Take 40 mg by mouth every evening.  08/27/16   [provider]  carvedilol (COREG) 3.125 MG tablet Take 3.125 mg by mouth 2 (two) times daily with a meal.    [provider]  Cholecalciferol (VITAMIN D3) 25 MCG (1000 UT) CAPS TAKE TWO (2) CAPSULES BY MOUTH ONCE DAILY 04/04/23   Johney Maine, MD  famotidine (PEPCID) 20 MG tablet One after supper 11/28/20   Nyoka Cowden, MD  folic acid  (FOLVITE) 1 MG tablet TAKE 1 TABLET BY MOUTH ONCE DAILY 04/04/23   Johney Maine, MD  hydroxyurea (HYDREA) 500 MG capsule TAKE ONE CAPSULE BY MOUTH ON DAYS MONDAY-THURSDAY. DO not take friday, saturday, OR sunday 04/12/23   Johney Maine, MD  iron polysaccharides (NIFEREX) 150 MG capsule TAKE ONE CAPSULE BY MOUTH ONCE DAILY 08/15/22   Johney Maine, MD  levothyroxine (SYNTHROID) 50 MCG tablet Take 50 mcg by mouth every morning. 12/14/19   [provider]  Melatonin 5 MG TABS Take 5 mg by mouth at bedtime.    [provider]  omeprazole (PRILOSEC) 40 MG capsule Take 40 mg by mouth every evening.     [provider]  potassium chloride SA (KLOR-CON M) 20 MEQ tablet TAKE ONE TABLET BY MOUTH ONCE daily 09/27/21   Johney Maine, MD  predniSONE (DELTASONE) 5 MG tablet Take 5 mg by mouth daily.    [provider]  venlafaxine XR (EFFEXOR-XR) 150 MG 24 hr capsule Take 150 mg by mouth every evening.  08/27/16   [provider]    Physical Exam: Vitals:   06/07/23 0639 06/07/23 0645 06/07/23 0731 06/07/23 0830  BP:  (!) 166/61 (!) 145/77 (!) 155/74  Pulse:  92 90 (!) 106  Resp:  (!) 27 20 (!) 27  Temp:      TempSrc:      SpO2:  96% 98% (!) 89%  Weight: 55 kg     Height: 4\' 11"  (1.499 m)      General: Pleasant, well-appearing elderly woman laying in bed. No acute distress. HEENT: [place]/AT. Anicteric sclera. MMM. CV: Tachycardic.  Regular rhythm with occasional extra beats. No murmurs, rubs, or gallops. Trace BLE edema. Pulmonary: On 2 L [place]. Lungs CTAB. Normal effort. Mild expiratory wheezes in the upper lung fields. Distant rales at the bases. No rhonchi. Abdominal: Soft, nontender, nondistended. Normal bowel sounds. Extremities: Palpable radial and DP pulses. Normal ROM. Skin: Warm and dry. No obvious rash or lesions. Neuro: A&Ox3. Moves all extremities. Normal sensation to light touch. No focal deficit. Psych: Normal mood and  affect  Data Reviewed:  Labs showed sodium 140, K+ 3.7, bicarb 14, glucose 231, BUN/creatinine 36/2.07, WBC 14.3, Hgb 10.0, platelet 579, BNP 967, lactic acid 3.5-1.3, troponin 95-1 69, negative flu and COVID test. CXR showed extensive bilateral airspace disease concerning for edema. EKG shows sinus tach with multiple PVCs.  Assessment and Plan: CELESTINE WILMOTT is a 77 y.o. female with medical history significant of sarcoidosis, essential thrombocytosis, CKD 3B, osteoarthritis, HTN, HLD and hypothyroidism who presented via EMS for evaluation of SOB and admitted for acute hypoxic respiratory failure.  # Acute hypoxic respiratory failure Patient with history of COPD but no documented history of heart failure presented with shortness of breath and respiratory failure requiring BiPAP therapy on admission. Respiratory status  now improved and currently on 2 L [place].  Pulmonary edema on chest x-ray as well as elevated BNP to 900 points to possible heart failure. She has trace BLE edema and distant rales on exam but not significantly fluid overloaded on exam.  Her presentation likely combination of COPD exacerbation and possible heart failure. Her last echo in 2021 showed moderately increased LV posterior wall thickness but otherwise unremarkable. Status post IV Lasix 40 mg in the ED. -Continue supplemental O2 -Check TTE -Continue IV Lasix 40 mg daily -Trend and replete electrolytes -Strict I&O's, daily weights  # COPD exacerbation Patient reports smoking more than 1 pack/day of cigarettes for 45 years before quitting 2009. Albuterol inhaler usually helps with her wheezing and shortness of breath but did not last night. She is feeling much better now but continues to wheeze on exam. S/p IV Solu-Medrol and mag by EMS and route. -Prednisone 40 mg daily for 4 days then transition to home prednisone 5 mg daily -Start Incruse Ellipta daily -As needed DuoNebs  # Leukocytosis WBC of 14.3 on admission.  Received Levaquin in the ED due to concern for possible pneumonia. Patient denies any recent fevers, chills or productive cough. CXR more concerning for pulmonary edema over pneumonia. She is afebrile. Leukocytosis likely reactive. -No further antibiotics -Trend CBC  # Troponinemia Troponin of 95 on admission up to 169 on repeat. Patient denies any chest pain.  EKG without any ischemic changes. This is likely demand ischemia in the setting of acute hypoxic respiratory failure. -Telemetry -Trend troponin until flat  #Hx of sarcoidosis Diagnosed in March 2019 with polyarthralgia, erythema nodosum Raynaud's, hilar adenopathy elevated ACE levels and pulmonary granuloma. Follows with rheumatology at [facility]. On leflunomide 20 mg daily and prednisone 5 mg daily -Continue leflunomide 20 mg daily -Prednisone 40 mg daily for 4 days then resume home dose of 5 mg daily  # AKI on CKD 3B # Metabolic acidosis Unclear baseline creatinine seems around 1.5-1.6 based on chart review.  Creatinine of 2.07 on admission with bicarb of 14. AKI likely due to new heart failure. This could also be possible progression of her CKD. -Trend renal function -Avoid nephrotoxic agents  # Normocytic anemia Hemoglobin of 10.0 seems to be around her baseline of 10-11. Previously on iron supplementation. -Check iron studies, ferritin, vitamin B12, folate  # HTN BP elevated with SBP in the 140s to 160s. -Coreg 3.125 mg twice daily -Amlodipine 5 mg daily  # Essential thrombocytosis Follows with Dr. Candise Che, heme-onc in the outpatient. Platelet of 579 on admission. -Aspirin 81 mg daily -Hydroxyurea 500 mg 3 times weekly (MWF)  # Hypothyroidism -Follow-up repeat TSH -Levothyroxine 50 mcg daily  # HLD -Atorvastatin 40 mg daily    Advance Care Planning:   Code Status: Full Code   Consults: None  Family Communication: No family at bedside  Severity of Illness: The appropriate patient status for this patient is  INPATIENT. Inpatient status is judged to be reasonable and necessary in order to provide the required intensity of service to ensure the patient's safety. The patient's presenting symptoms, physical exam findings, and initial radiographic and laboratory data in the context of their chronic comorbidities is felt to place them at high risk for further clinical deterioration. Furthermore, it is not anticipated that the patient will be medically stable for discharge from the hospital within 2 midnights of admission.   * I certify that at the point of admission it is my clinical judgment that the patient will require  inpatient hospital care spanning beyond 2 midnights from the point of admission due to high intensity of service, high risk for further deterioration and high frequency of surveillance required.*  Author: Steffanie Rainwater, MD 06/07/2023 9:22 AM  For on call review [email or web address].

## 2023-06-07 NOTE — ED Notes (Signed)
ED TO INPATIENT HANDOFF REPORT  ED Nurse Name and Phone #: Ivory Broad RN [phone]  S Name/Age/Gender Coralee Rud 77 y.o. female Room/Bed: 016C/016C  Code Status   Code Status: Full Code  Home/SNF/Other Home Patient oriented to: self, place, time, and situation Is this baseline? Yes   Triage Complete: Triage complete  Chief Complaint Acute respiratory failure with hypoxia (HCC) [J96.01]  Triage Note Pt from home via [facility], pt awoke at 0500 with DIB, used inhaler multiple times without relief. On arrival of EMS sats 80% on RA. Placed on CPAP and given 7.5 alutarol, duoneb, 2gm mag, 125mg  solumedrol, 0.4 S/L NTG   Allergies Allergies  Allergen Reactions   Penicillins Anaphylaxis and Rash    Did it involve swelling of the face/tongue/throat, SOB, or low BP? Yes  Did it involve sudden or severe rash/hives, skin peeling, or any reaction on the inside of your mouth or nose? No Did you need to seek medical attention at a hospital or doctor's office? No When did it last happen? Childhood If all above answers are "NO", may proceed with cephalosporin use.    Codeine Other (See Comments)    Vomiting    Pentazocine     Other reaction(s): itch    Level of Care/Admitting Diagnosis ED Disposition     ED Disposition  Admit   Condition  --   Comment  Hospital Area: [facility] [[id number]]  Level of Care: Telemetry Medical [104]  May admit patient to [facility] or [facility] if equivalent level of care is available:: No  Covid Evaluation: Asymptomatic - no recent exposure (last 10 days) testing not required  Diagnosis: Acute respiratory failure with hypoxia [facility]) [[id number]]  Admitting Physician: Steffanie Rainwater [[id number]]  Attending Physician: Steffanie Rainwater [[id number]]  Certification:: I certify this patient will need inpatient services for at least 2 midnights  Expected Medical Readiness: 06/09/2023          B Medical/Surgery History Past Medical  History:  Diagnosis Date   Arthritis    Hyperlipidemia    Sarcoidosis    Past Surgical History:  Procedure Laterality Date   ABDOMINAL HYSTERECTOMY     BREAST BIOPSY     BREAST SURGERY     REDUCTION MAMMAPLASTY       A IV Location/Drains/Wounds Patient Lines/Drains/Airways Status     Active Line/Drains/Airways     Name Placement date Placement time Site Days   Peripheral IV 06/07/23 20 G Left Antecubital 06/07/23  0530  Antecubital  less than 1   Peripheral IV 06/07/23 22 G Posterior;Right Forearm 06/07/23  0615  Forearm  less than 1   External Urinary Catheter 06/07/23  0750  --  less than 1            Intake/Output Last 24 hours No intake or output data in the 24 hours ending 06/07/23 1327  Labs/Imaging Results for orders placed or performed during the hospital encounter of 06/07/23 (from the past 48 hour(s))  Culture, blood (single) w Reflex to ID Panel     Status: None (Preliminary result)   Collection Time: 06/07/23  6:12 AM   Specimen: BLOOD  Result Value Ref Range   Specimen Description BLOOD BLOOD RIGHT HAND    Special Requests      BOTTLES DRAWN AEROBIC AND ANAEROBIC Blood Culture results may not be optimal due to an inadequate volume of blood received in culture bottles   Culture      NO  GROWTH < 12 HOURS Performed at [facility], [address] [place], [place] [address]    Report Status PENDING   Resp panel by RT-PCR (RSV, Flu A&B, Covid) Anterior Nasal Swab     Status: None   Collection Time: 06/07/23  6:12 AM   Specimen: Anterior Nasal Swab  Result Value Ref Range   SARS Coronavirus 2 by RT PCR NEGATIVE NEGATIVE   Influenza A by PCR NEGATIVE NEGATIVE   Influenza B by PCR NEGATIVE NEGATIVE    Comment: (NOTE) The Xpert Xpress SARS-CoV-2/FLU/RSV plus assay is intended as an aid in the diagnosis of influenza from Nasopharyngeal swab specimens and should not be used as a sole basis for treatment. Nasal washings and aspirates are unacceptable  for Xpert Xpress SARS-CoV-2/FLU/RSV testing.  Fact Sheet for Patients: BloggerCourse.com  Fact Sheet for Healthcare Providers: SeriousBroker.it  This test is not yet approved or cleared by the [place] FDA and has been authorized for detection and/or diagnosis of SARS-CoV-2 by FDA under an Emergency Use Authorization (EUA). This EUA will remain in effect (meaning this test can be used) for the duration of the COVID-19 declaration under Section 564(b)(1) of the Act, 21 U.S.C. section 360bbb-3(b)(1), unless the authorization is terminated or revoked.     Resp Syncytial Virus by PCR NEGATIVE NEGATIVE    Comment: (NOTE) Fact Sheet for Patients: BloggerCourse.com  Fact Sheet for Healthcare Providers: SeriousBroker.it  This test is not yet approved or cleared by the [place] FDA and has been authorized for detection and/or diagnosis of SARS-CoV-2 by FDA under an Emergency Use Authorization (EUA). This EUA will remain in effect (meaning this test can be used) for the duration of the COVID-19 declaration under Section 564(b)(1) of the Act, 21 U.S.C. section 360bbb-3(b)(1), unless the authorization is terminated or revoked.  Performed at [facility] Lab, [address] [place], [place] [address]   CBC with Differential/Platelet     Status: Abnormal   Collection Time: 06/07/23  6:30 AM  Result Value Ref Range   WBC 14.3 (H) 4.0 - 10.5 K/uL   RBC 3.39 (L) 3.87 - 5.11 MIL/uL   Hemoglobin 10.0 (L) 12.0 - 15.0 g/dL   HCT 09.8 (L) 11.9 - 14.7 %   MCV 97.3 80.0 - 100.0 fL   MCH 29.5 26.0 - 34.0 pg   MCHC 30.3 30.0 - 36.0 g/dL   RDW 82.9 56.2 - 13.0 %   Platelets 579 (H) 150 - 400 K/uL   nRBC 0.0 0.0 - 0.2 %   Neutrophils Relative % 73 %   Neutro Abs 10.6 (H) 1.7 - 7.7 K/uL   Lymphocytes Relative 17 %   Lymphs Abs 2.5 0.7 - 4.0 K/uL   Monocytes Relative 6 %   Monocytes  Absolute 0.8 0.1 - 1.0 K/uL   Eosinophils Relative 2 %   Eosinophils Absolute 0.2 0.0 - 0.5 K/uL   Basophils Relative 1 %   Basophils Absolute 0.1 0.0 - 0.1 K/uL   Immature Granulocytes 1 %   Abs Immature Granulocytes 0.13 (H) 0.00 - 0.07 K/uL    Comment: Performed at [facility], [address], [place], [place] [address]  Comprehensive metabolic panel     Status: Abnormal   Collection Time: 06/07/23  6:30 AM  Result Value Ref Range   Sodium 140 135 - 145 mmol/L   Potassium 3.7 3.5 - 5.1 mmol/L   Chloride 112 (H) 98 - 111 mmol/L   CO2 14 (L)  22 - 32 mmol/L   Glucose, Bld 231 (H) 70 - 99 mg/dL    Comment: Glucose reference range applies only to samples taken after fasting for at least 8 hours.   BUN 36 (H) 8 - 23 mg/dL   Creatinine, Ser 5.62 (H) 0.44 - 1.00 mg/dL   Calcium 8.5 (L) 8.9 - 10.3 mg/dL   Total Protein 6.2 (L) 6.5 - 8.1 g/dL   Albumin 3.4 (L) 3.5 - 5.0 g/dL   AST 42 (H) 15 - 41 U/L   ALT 25 0 - 44 U/L   Alkaline Phosphatase 76 38 - 126 U/L   Total Bilirubin 0.4 <1.2 mg/dL   GFR, Estimated 24 (L) >60 mL/min    Comment: (NOTE) Calculated using the CKD-EPI Creatinine Equation (2021)    Anion gap 14 5 - 15    Comment: Performed at [facility], [address] [place], [place] [address]  Troponin I (High Sensitivity)     Status: Abnormal   Collection Time: 06/07/23  6:30 AM  Result Value Ref Range   Troponin I (High Sensitivity) 95 (H) <18 ng/L    Comment: (NOTE) Elevated high sensitivity troponin I (hsTnI) values and significant  changes across serial measurements may suggest ACS but many other  chronic and acute conditions are known to elevate hsTnI results.  Refer to the "Links" section for chest pain algorithms and additional  guidance. Performed at [facility], [address], [place], [place] [address]   Brain natriuretic peptide     Status: Abnormal   Collection Time: 06/07/23  6:30 AM  Result Value Ref Range   B Natriuretic Peptide  967.4 (H) 0.0 - 100.0 pg/mL    Comment: Performed at [facility], [address] [place], [place] [address]  I-Stat CG4 Lactic Acid     Status: Abnormal   Collection Time: 06/07/23  6:39 AM  Result Value Ref Range   Lactic Acid, Venous 3.5 (HH) 0.5 - 1.9 mmol/L   Comment NOTIFIED PHYSICIAN   Culture, blood (single)     Status: None (Preliminary result)   Collection Time: 06/07/23  7:01 AM   Specimen: BLOOD  Result Value Ref Range   Specimen Description BLOOD BLOOD LEFT HAND    Special Requests      AEROBIC BOTTLE ONLY Blood Culture results may not be optimal due to an inadequate volume of blood received in culture bottles   Culture      NO GROWTH <12 HOURS Performed at [facility], [address] [place], [place] [address]    Report Status PENDING   Troponin I (High Sensitivity)     Status: Abnormal   Collection Time: 06/07/23  8:22 AM  Result Value Ref Range   Troponin I (High Sensitivity) 169 (HH) <18 ng/L    Comment: CRITICAL RESULT CALLED TO, READ BACK BY AND VERIFIED WITH Lew Dawes, RN [phone] 06/07/23 ALTOM,A (NOTE) Elevated high sensitivity troponin I (hsTnI) values and significant  changes across serial measurements may suggest ACS but many other  chronic and acute conditions are known to elevate hsTnI results.  Refer to the "Links" section for chest pain algorithms and additional  guidance. Performed at [facility] Lab, [address] [place], [place] [address]   I-Stat CG4 Lactic Acid     Status: None   Collection Time: 06/07/23  9:05 AM  Result Value Ref Range   Lactic Acid, Venous 1.3 0.5 - 1.9 mmol/L  ECHOCARDIOGRAM COMPLETE  Result Date: 06/07/2023    ECHOCARDIOGRAM REPORT   Patient Name:   SINCLAIRE KOSIN Date of Exam: 06/07/2023 Medical Rec #:  [id number]       Height:       59.0 in Accession #:    [id number]      Weight:       121.3 lb Date of Birth:  Oct 18, 1945       BSA:          1.491 m Patient Age:    77 years        BP:           155/74  mmHg Patient Gender: F               HR:           113 bpm. Exam Location:  Inpatient Procedure: 2D Echo, Color Doppler, Cardiac Doppler and Intracardiac            Opacification Agent Indications:     Dyspnea  History:         Patient has prior history of Echocardiogram examinations, most                  recent 07/30/2019.  Sonographer:     Darlys Gales Referring Phys:  [id number] PROSPER M AMPONSAH Diagnosing Phys: Mary Davan Hark IMPRESSIONS  1. 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 The left ventricle demonstrates global hypokinesis. The left ventricular internal cavity size was mildly dilated. There is mild left ventricular hypertrophy. Left ventricular diastolic parameters are indeterminate.  2. Right ventricular systolic function is normal. The right ventricular size is normal. There is normal pulmonary artery systolic pressure. The estimated right ventricular systolic pressure is 16.8 mmHg.  3. Left atrial size was severely dilated.  4. The mitral valve is degenerative. Mild to moderate mitral valve regurgitation.  5. The aortic valve was not well visualized. Aortic valve regurgitation is moderate to severe. Mild aortic valve stenosis.  6. Not well visualized.  7. The inferior vena cava is normal in size with greater than 50% respiratory variability, suggesting right atrial pressure of 3 mmHg. Conclusion(s)/Recommendation(s): Compared to prior echo, EF has declined. AI is at least moderate. Recommend CT chest to rule out dissection, the ascending aorta was not well visualized. 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 The left ventricle demonstrates global hypokinesis. The left ventricular internal cavity size was mildly dilated. There is mild left ventricular hypertrophy. Left ventricular diastolic  parameters are indeterminate. Right Ventricle: The right ventricular size is normal. Right ventricular systolic function is normal. There is normal pulmonary artery systolic pressure. The tricuspid regurgitant velocity is 1.86 m/s, and with an assumed right atrial pressure of 3 mmHg,  the estimated right ventricular systolic pressure is 16.8 mmHg. Left Atrium: Left atrial size was severely dilated. Right Atrium: Right atrial size was normal in size. Pericardium: There is no evidence of pericardial effusion. Mitral Valve: The mitral valve is degenerative in appearance. Mild to moderate mitral valve regurgitation. Tricuspid Valve: Tricuspid valve regurgitation is trivial. Aortic Valve: The aortic valve was not well visualized. Aortic valve regurgitation is moderate to severe. Aortic regurgitation PHT measures 250 msec. Mild aortic stenosis is present. Aortic valve mean gradient measures 13.0 mmHg. Aortic valve peak gradient measures 21.0 mmHg. Aortic valve area,  by VTI measures 1.08 cm. Aorta: Not well visualized. Venous: The inferior vena cava is normal in size with greater than 50% respiratory variability, suggesting right atrial pressure of 3 mmHg. IAS/Shunts: The interatrial septum was not well visualized.  LEFT VENTRICLE PLAX 2D LVIDd:         5.10 cm LVIDs:         4.40 cm LV PW:         1.00 cm LV IVS:        1.10 cm LVOT diam:     1.80 cm LV SV:         43 LV SV Index:   29 LVOT Area:     2.54 cm  RIGHT VENTRICLE RV S prime:     13.30 cm/s TAPSE (M-mode): 2.1 cm LEFT ATRIUM           Index        RIGHT ATRIUM           Index LA Vol (A4C): 67.4 ml 45.20 ml/m  RA Area:     11.00 cm                                    RA Volume:   20.50 ml  13.75 ml/m  AORTIC VALVE AV Area (Vmax):    1.10 cm AV Area (Vmean):   1.03 cm AV Area (VTI):     1.08 cm AV Vmax:           229.00 cm/s AV Vmean:          172.000 cm/s AV VTI:            0.397 m AV Peak Grad:      21.0 mmHg AV Mean Grad:      13.0 mmHg LVOT Vmax:          99.00 cm/s LVOT Vmean:        69.900 cm/s LVOT VTI:          0.168 m LVOT/AV VTI ratio: 0.42 AI PHT:            250 msec MITRAL VALVE                TRICUSPID VALVE MV Area (PHT): 4.68 cm     TR Peak grad:   13.8 mmHg MV Decel Time: 162 msec     TR Vmax:        186.00 cm/s MV E velocity: 161.00 cm/s                             SHUNTS                             Systemic VTI:  0.17 m                             Systemic Diam: 1.80 cm Carolan Clines Electronically signed by Carolan Clines Signature Date/Time: 06/07/2023/11:58:50 AM    Final (Updated)    DG Chest Port 1 View  Result Date: 06/07/2023 CLINICAL DATA:  Shortness of breath EXAM: PORTABLE CHEST 1 VIEW COMPARISON:  11/28/2020 FINDINGS: Hazy opacification of the bilateral chest. Cardiomediastinal contours are distorted by rotation with accentuated ascending aorta and foreshortening of the cardiac size. No visible effusion or pneumothorax.  IMPRESSION: Extensive and bilateral airspace disease favoring edema over infection. Electronically Signed   By: Tiburcio Pea M.D.   On: 06/07/2023 06:40    Pending Labs Unresulted Labs (From admission, onward)     Start     Ordered   06/08/23 0500  CBC  Tomorrow morning,   R        06/07/23 1041   06/08/23 0500  Basic metabolic panel  Tomorrow morning,   R        06/07/23 1041   06/07/23 1309  Cooxemetry Panel (carboxy, met, total hgb, O2 sat)  Add-on,   TIMED        06/07/23 1308   06/07/23 1044  Folate  Once,   R        06/07/23 1043   06/07/23 1044  TSH  Once,   R        06/07/23 1043   06/07/23 1042  Magnesium  Once,   R        06/07/23 1041   06/07/23 1042  Phosphorus  Once,   R        06/07/23 1041   06/07/23 1041  Iron and TIBC  Once,   R        06/07/23 1041   06/07/23 1041  Ferritin  Once,   R        06/07/23 1041   06/07/23 1041  Vitamin B12  Once,   R        06/07/23 1041   06/07/23 1041  VITAMIN D 25 Hydroxy (Vit-D Deficiency, Fractures)  Once,   R        06/07/23 1041             Vitals/Pain Today's Vitals   06/07/23 0732 06/07/23 0830 06/07/23 1142 06/07/23 1319  BP:  (!) 155/74  (!) 153/80  Pulse:  (!) 106  98  Resp:  (!) 27  20  Temp:   97.9 F (36.6 C) 98.4 F (36.9 C)  TempSrc:   Oral Oral  SpO2:  (!) 89%  96%  Weight:      Height:      PainSc: 0-No pain       Isolation Precautions No active isolations  Medications Medications  enoxaparin (LOVENOX) injection 30 mg (has no administration in time range)  acetaminophen (TYLENOL) tablet 650 mg (has no administration in time range)    Or  acetaminophen (TYLENOL) suppository 650 mg (has no administration in time range)  senna-docusate (Senokot-S) tablet 1 tablet (has no administration in time range)  ondansetron (ZOFRAN) tablet 4 mg (has no administration in time range)    Or  ondansetron (ZOFRAN) injection 4 mg (has no administration in time range)  amLODipine (NORVASC) tablet 5 mg (has no administration in time range)  atorvastatin (LIPITOR) tablet 40 mg (has no administration in time range)  carvedilol (COREG) tablet 3.125 mg (has no administration in time range)  levothyroxine (SYNTHROID) tablet 50 mcg (has no administration in time range)  pantoprazole (PROTONIX) EC tablet 40 mg (has no administration in time range)  umeclidinium bromide (INCRUSE ELLIPTA) 62.5 MCG/ACT 1 puff (has no administration in time range)  ipratropium-albuterol (DUONEB) 0.5-2.5 (3) MG/3ML nebulizer solution 3 mL (has no administration in time range)  predniSONE (DELTASONE) tablet 40 mg (has no administration in time range)  furosemide (LASIX) injection 40 mg (has no administration in time range)  leflunomide (ARAVA) tablet 20 mg (has no administration in time range)  venlafaxine XR (EFFEXOR-XR) 24 hr  capsule 150 mg (has no administration in time range)  hydroxyurea (HYDREA) capsule 500 mg (has no administration in time range)  folic acid (FOLVITE) tablet 1 mg (has no administration in time range)  aspirin EC tablet  81 mg (has no administration in time range)  melatonin tablet 5 mg (has no administration in time range)  perflutren lipid microspheres (DEFINITY) IV suspension (2 mLs Intravenous Given 06/07/23 1129)  ipratropium-albuterol (DUONEB) 0.5-2.5 (3) MG/3ML nebulizer solution (  Given 06/07/23 0635)  levofloxacin (LEVAQUIN) IVPB 750 mg (0 mg Intravenous Stopped 06/07/23 0914)  furosemide (LASIX) injection 40 mg (40 mg Intravenous Given 06/07/23 0742)    Mobility walks     Focused Assessments Pulmonary Assessment Handoff:  Lung sounds: Bilateral Breath Sounds: Rales L Breath Sounds: Rales R Breath Sounds: Rales O2 Device: Nasal Cannula O2 Flow Rate (L/min): 2 L/min    R Recommendations: See Admitting Provider Note  Report given to:   Additional Notes:

## 2023-06-07 NOTE — ED Provider Notes (Signed)
[place] EMERGENCY DEPARTMENT AT [facility] Provider Note   CSN: [id number] Arrival date & time: 06/07/23  4098     History  Chief Complaint  Patient presents with   Respiratory Distress    Alexis Murphy is a 77 y.o. female.  Patient brought to the emergency department by EMS from home.  Patient called EMS for shortness of breath.  She has a history of COPD.  She has been using her inhaler multiple times without improvement.  First responders report that the patient was in distress with oxygen saturations in the 80% range.  She does not use oxygen at home.  She was given IV Solu-Medrol, IV magnesium, multiple breathing treatments without significant improvement.  Patient placed on CPAP for transport.  She has not distress at arrival.       Home Medications Prior to Admission medications   Medication Sig Start Date End Date Taking? Authorizing Provider  acetaminophen (TYLENOL) 325 MG tablet Take 1 tablet (325 mg total) by mouth every 6 (six) hours. Patient taking differently: Take 325 mg by mouth every 6 (six) hours as needed for headache. 03/10/17   Caccavale, Sophia, PA-C  amLODipine (NORVASC) 5 MG tablet Take 5 mg by mouth every evening.  08/27/16   [provider]  aspirin EC 81 MG tablet Take 81 mg by mouth daily.    [provider]  atorvastatin (LIPITOR) 40 MG tablet Take 40 mg by mouth every evening.  08/27/16   [provider]  carvedilol (COREG) 3.125 MG tablet Take 3.125 mg by mouth 2 (two) times daily with a meal.    [provider]  Cholecalciferol (VITAMIN D3) 25 MCG (1000 UT) CAPS TAKE TWO (2) CAPSULES BY MOUTH ONCE DAILY 04/04/23   Johney Maine, MD  famotidine (PEPCID) 20 MG tablet One after supper 11/28/20   Nyoka Cowden, MD  folic acid (FOLVITE) 1 MG tablet TAKE 1 TABLET BY MOUTH ONCE DAILY 04/04/23   Johney Maine, MD  hydroxyurea (HYDREA) 500 MG capsule TAKE ONE CAPSULE BY MOUTH ON DAYS  MONDAY-THURSDAY. DO not take friday, saturday, OR sunday 04/12/23   Johney Maine, MD  iron polysaccharides (NIFEREX) 150 MG capsule TAKE ONE CAPSULE BY MOUTH ONCE DAILY 08/15/22   Johney Maine, MD  levothyroxine (SYNTHROID) 50 MCG tablet Take 50 mcg by mouth every morning. 12/14/19   [provider]  Melatonin 5 MG TABS Take 5 mg by mouth at bedtime.    [provider]  omeprazole (PRILOSEC) 40 MG capsule Take 40 mg by mouth every evening.     [provider]  potassium chloride SA (KLOR-CON M) 20 MEQ tablet TAKE ONE TABLET BY MOUTH ONCE daily 09/27/21   Johney Maine, MD  predniSONE (DELTASONE) 5 MG tablet Take 5 mg by mouth daily.    [provider]  venlafaxine XR (EFFEXOR-XR) 150 MG 24 hr capsule Take 150 mg by mouth every evening.  08/27/16   [provider]      Allergies    Penicillins, Codeine, and Pentazocine    Review of Systems   Review of Systems  Physical Exam Updated Vital Signs BP (!) 166/61   Pulse 92   Temp (!) 97.5 F (36.4 C) (Axillary)   Resp (!) 27   Ht 4\' 11"  (1.499 m)   Wt 55 kg   SpO2 96%   BMI 24.49 kg/m  Physical Exam Vitals and nursing note reviewed.  Constitutional:  General: She is in acute distress.     Appearance: She is well-developed.  HENT:     Head: Normocephalic and atraumatic.     Mouth/Throat:     Mouth: Mucous membranes are moist.  Eyes:     General: Vision grossly intact. Gaze aligned appropriately.     Extraocular Movements: Extraocular movements intact.     Conjunctiva/sclera: Conjunctivae normal.  Cardiovascular:     Rate and Rhythm: Normal rate and regular rhythm.     Pulses: Normal pulses.     Heart sounds: Normal heart sounds, S1 normal and S2 normal. No murmur heard.    No friction rub. No gallop.     Comments: Trace bilateral LE edema Pulmonary:     Effort: Tachypnea, accessory muscle usage, prolonged expiration and respiratory distress present.     Breath  sounds: Decreased air movement present. Wheezing and rales present.  Abdominal:     General: Bowel sounds are normal.     Palpations: Abdomen is soft.     Tenderness: There is no abdominal tenderness. There is no guarding or rebound.     Hernia: No hernia is present.  Musculoskeletal:        General: No swelling.     Cervical back: Full passive range of motion without pain, normal range of motion and neck supple. No spinous process tenderness or muscular tenderness. Normal range of motion.     Right lower leg: No edema.     Left lower leg: No edema.  Skin:    General: Skin is warm and dry.     Capillary Refill: Capillary refill takes less than 2 seconds.     Findings: No ecchymosis, erythema, rash or wound.  Neurological:     General: No focal deficit present.     Mental Status: She is alert and oriented to person, place, and time.     GCS: GCS eye subscore is 4. GCS verbal subscore is 5. GCS motor subscore is 6.     Cranial Nerves: Cranial nerves 2-12 are intact.     Sensory: Sensation is intact.     Motor: Motor function is intact.     Coordination: Coordination is intact.  Psychiatric:        Attention and Perception: Attention normal.        Mood and Affect: Mood normal.        Speech: Speech normal.        Behavior: Behavior normal.     ED Results / Procedures / Treatments   Labs (all labs ordered are listed, but only abnormal results are displayed) Labs Reviewed  CBC WITH DIFFERENTIAL/PLATELET - Abnormal; Notable for the following components:      Result Value   WBC 14.3 (*)    RBC 3.39 (*)    Hemoglobin 10.0 (*)    HCT 33.0 (*)    Platelets 579 (*)    Neutro Abs 10.6 (*)    Abs Immature Granulocytes 0.13 (*)    All other components within normal limits  I-STAT CG4 LACTIC ACID, ED - Abnormal; Notable for the following components:   Lactic Acid, Venous 3.5 (*)    All other components within normal limits  CULTURE, BLOOD (SINGLE)  RESP PANEL BY RT-PCR (RSV, FLU  A&B, COVID)  RVPGX2  CULTURE, BLOOD (SINGLE)  COMPREHENSIVE METABOLIC PANEL  BRAIN NATRIURETIC PEPTIDE  I-STAT ARTERIAL BLOOD GAS, ED  TROPONIN I (HIGH SENSITIVITY)    EKG EKG Interpretation Date/Time:  Friday June 07 2023 06:10:53  EST Ventricular Rate:  100 PR Interval:  169 QRS Duration:  142 QT Interval:  417 QTC Calculation: 538 R Axis:   6  Text Interpretation: Sinus tachycardia Multiform ventricular premature complexes Left bundle branch block Confirmed by Gilda Crease [phone]) on 06/07/2023 6:16:16 AM  Radiology DG Chest Port 1 View  Result Date: 06/07/2023 CLINICAL DATA:  Shortness of breath EXAM: PORTABLE CHEST 1 VIEW COMPARISON:  11/28/2020 FINDINGS: Hazy opacification of the bilateral chest. Cardiomediastinal contours are distorted by rotation with accentuated ascending aorta and foreshortening of the cardiac size. No visible effusion or pneumothorax. IMPRESSION: Extensive and bilateral airspace disease favoring edema over infection. Electronically Signed   By: Tiburcio Pea M.D.   On: 06/07/2023 06:40    Procedures Procedures    Medications Ordered in ED Medications  levofloxacin (LEVAQUIN) IVPB 750 mg (has no administration in time range)  furosemide (LASIX) injection 40 mg (has no administration in time range)  ipratropium-albuterol (DUONEB) 0.5-2.5 (3) MG/3ML nebulizer solution (  Given 06/07/23 1751)    ED Course/ Medical Decision Making/ A&P                                 Medical Decision Making Amount and/or Complexity of Data Reviewed Labs: ordered. Radiology: ordered.  Risk Prescription drug management.   Differential Diagnosis considered includes, but not limited to: COPD exacerbation; Bronchitis; Pneumonia; CHF; ACS; PE  Presents to emergency department for evaluation of shortness of breath.  Patient with history of COPD.  Patient progressive worsening overnight, not responding to maximal treatment for COPD.  Chest x-ray  more suspicious for volume overload with bilateral edema.  Will give dose of Lasix.  Patient does have a leukocytosis and elevated lactic acid, however.  Will treat for infectious etiology.  Patient with reported history of anaphylaxis with penicillins in the past, given Levaquin for pulmonary coverage.  Bulk of workup pending at shift change.  Will sign out to oncoming ER provider to follow-up on labs prior to admission.  CRITICAL CARE Performed by: Gilda Crease   Total critical care time: 30 minutes  Critical care time was exclusive of separately billable procedures and treating other patients.  Critical care was necessary to treat or prevent imminent or life-threatening deterioration.  Critical care was time spent personally by me on the following activities: development of treatment plan with patient and/or surrogate as well as nursing, discussions with consultants, evaluation of patient's response to treatment, examination of patient, obtaining history from patient or surrogate, ordering and performing treatments and interventions, ordering and review of laboratory studies, ordering and review of radiographic studies, pulse oximetry and re-evaluation of patient's condition.         Final Clinical Impression(s) / ED Diagnoses Final diagnoses:  Acute respiratory failure with hypoxia [facility])    Rx / DC Orders ED Discharge Orders     None         Eloyce Bultman, Canary Brim, MD 06/07/23 [phone]

## 2023-06-07 NOTE — ED Triage Notes (Signed)
Pt from home via [facility] EMS, pt awoke at 0500 with DIB, used inhaler multiple times without relief. On arrival of EMS sats 80% on RA. Placed on CPAP and given 7.5 alutarol, duoneb, 2gm mag, 125mg  solumedrol, 0.4 S/L NTG

## 2023-06-08 ENCOUNTER — Other Ambulatory Visit (HOSPITAL_COMMUNITY): Payer: Self-pay

## 2023-06-08 DIAGNOSIS — D509 Iron deficiency anemia, unspecified: Secondary | ICD-10-CM

## 2023-06-08 DIAGNOSIS — I10 Essential (primary) hypertension: Secondary | ICD-10-CM

## 2023-06-08 DIAGNOSIS — I351 Nonrheumatic aortic (valve) insufficiency: Secondary | ICD-10-CM | POA: Insufficient documentation

## 2023-06-08 DIAGNOSIS — J449 Chronic obstructive pulmonary disease, unspecified: Secondary | ICD-10-CM

## 2023-06-08 DIAGNOSIS — J441 Chronic obstructive pulmonary disease with (acute) exacerbation: Secondary | ICD-10-CM | POA: Insufficient documentation

## 2023-06-08 DIAGNOSIS — I5023 Acute on chronic systolic (congestive) heart failure: Secondary | ICD-10-CM

## 2023-06-08 DIAGNOSIS — E039 Hypothyroidism, unspecified: Secondary | ICD-10-CM

## 2023-06-08 DIAGNOSIS — I5021 Acute systolic (congestive) heart failure: Secondary | ICD-10-CM

## 2023-06-08 DIAGNOSIS — D869 Sarcoidosis, unspecified: Secondary | ICD-10-CM

## 2023-06-08 DIAGNOSIS — J439 Emphysema, unspecified: Secondary | ICD-10-CM

## 2023-06-08 DIAGNOSIS — I739 Peripheral vascular disease, unspecified: Secondary | ICD-10-CM

## 2023-06-08 DIAGNOSIS — N179 Acute kidney failure, unspecified: Secondary | ICD-10-CM | POA: Diagnosis not present

## 2023-06-08 LAB — BASIC METABOLIC PANEL
Anion gap: 10 (ref 5–15)
BUN: 36 mg/dL — ABNORMAL HIGH (ref 8–23)
CO2: 18 mmol/L — ABNORMAL LOW (ref 22–32)
Calcium: 8.6 mg/dL — ABNORMAL LOW (ref 8.9–10.3)
Chloride: 111 mmol/L (ref 98–111)
Creatinine, Ser: 1.9 mg/dL — ABNORMAL HIGH (ref 0.44–1.00)
GFR, Estimated: 27 mL/min — ABNORMAL LOW (ref >60)
Glucose, Bld: 95 mg/dL (ref 70–99)
Potassium: 3.3 mmol/L — ABNORMAL LOW (ref 3.5–5.1)
Sodium: 139 mmol/L (ref 135–145)

## 2023-06-08 LAB — CBC
HCT: 28 % — ABNORMAL LOW (ref 36.0–46.0)
Hemoglobin: 9 g/dL — ABNORMAL LOW (ref 12.0–15.0)
MCH: 29.6 pg (ref 26.0–34.0)
MCHC: 32.1 g/dL (ref 30.0–36.0)
MCV: 92.1 fL (ref 80.0–100.0)
Platelets: 501 10*3/uL — ABNORMAL HIGH (ref 150–400)
RBC: 3.04 MIL/uL — ABNORMAL LOW (ref 3.87–5.11)
RDW: 14.4 % (ref 11.5–15.5)
WBC: 13.7 10*3/uL — ABNORMAL HIGH (ref 4.0–10.5)
nRBC: 0 % (ref 0.0–0.2)

## 2023-06-08 MED ORDER — POTASSIUM CHLORIDE CRYS ER 20 MEQ PO TBCR
40.0000 meq | EXTENDED_RELEASE_TABLET | ORAL | Status: AC
Start: 2023-06-08 — End: 2023-06-08
  Administered 2023-06-08 (×2): 40 meq via ORAL
  Filled 2023-06-08 (×2): qty 2

## 2023-06-08 MED ORDER — FUROSEMIDE 10 MG/ML IJ SOLN
60.0000 mg | Freq: Two times a day (BID) | INTRAMUSCULAR | Status: DC
  Administered 2023-06-08 – 2023-06-09 (×2): 60 mg via INTRAVENOUS
  Filled 2023-06-08 (×2): qty 6

## 2023-06-08 MED ORDER — ISOSORBIDE MONONITRATE ER 30 MG PO TB24
15.0000 mg | ORAL_TABLET | Freq: Every day | ORAL | Status: DC
  Administered 2023-06-08 – 2023-06-09 (×2): 15 mg via ORAL
  Filled 2023-06-08 (×2): qty 1

## 2023-06-08 MED ORDER — PREDNISONE 5 MG PO TABS
5.0000 mg | ORAL_TABLET | Freq: Every day | ORAL | Status: DC
  Administered 2023-06-09 – 2023-06-13 (×5): 5 mg via ORAL
  Filled 2023-06-08 (×6): qty 1

## 2023-06-08 MED ORDER — FUROSEMIDE 10 MG/ML IJ SOLN
20.0000 mg | Freq: Once | INTRAMUSCULAR | Status: AC
  Administered 2023-06-08: 20 mg via INTRAVENOUS
  Filled 2023-06-08: qty 2

## 2023-06-08 MED ORDER — FUROSEMIDE 10 MG/ML IJ SOLN: 60.0000 mg | Freq: Two times a day (BID) | INTRAMUSCULAR | Status: DC

## 2023-06-08 MED ORDER — EMPAGLIFLOZIN 10 MG PO TABS
10.0000 mg | ORAL_TABLET | Freq: Every day | ORAL | Status: DC
  Administered 2023-06-08 – 2023-06-11 (×4): 10 mg via ORAL
  Filled 2023-06-08 (×5): qty 1

## 2023-06-08 NOTE — Assessment & Plan Note (Signed)
Plan to resume prednisone 5 mg daily.

## 2023-06-08 NOTE — Assessment & Plan Note (Signed)
CKD stage 3b. Hypokalemia   Today renal function with serum cr at 2,25 with K at 3,4 and serum bicarbonate at 21 Na 141   Continue with SGLT 2 inh for now .  Continue to hold on furosemide for now Add 40 meq Kcl today Follow up renal function and electrolytes in am.

## 2023-06-08 NOTE — Plan of Care (Signed)

## 2023-06-08 NOTE — Progress Notes (Signed)
SATURATION QUALIFICATIONS: (This note is used to comply with regulatory documentation for home oxygen)  Patient Saturations on Room Air at Rest = 95%  Patient Saturations on Room Air while Ambulating = 93% desat to 88% for about 10secs  Patient Saturations on 2 Liters of oxygen while Ambulating = 96%

## 2023-06-08 NOTE — Hospital Course (Signed)
Alexis Murphy was admitted to the hospital with the working diagnosis of heart failure exacerbation.   77 yo female with the past medical history of sarcoidosis, CKD stage 3b, hypertension, hypothyroid and dyslipidemia who presented with dyspnea. Patient had a acute onset of severe dyspnea, associated with wheezing. Symptoms were refractive to inhaled albuterol. Because severe symptoms EMS was called. She was found with 02 saturation 80%, she was placed on Cpap and was transported to the ED. On her initial physical examination her blood pressure was 166/61, HR 106, RR 27 and 02 saturation 89%, lungs with expiratory wheezing, and distant breath sounds, heart with S1 and S2 present, regular with no murmurs or gallops, abdomen with no distention and trace lower extremity edema.   ABG 7.24/ 35/ 53/ 15/ 82%  Na 140 K 3,7 CL 112 bicarbonate 14, glucose 231 BUN 36  Cr 2,0  Anion gap 14  AST 42, ALT 25  BNP 967 High sensitive troponin 95 and 169  Lactic acid 3,5 and 1.3  Wbc 14.3 hgb 10,0 plt 579   Chest radiograph with hyperinflation, right rotation, bilateral hilar vascular congestion and dense interstitial infiltrates centrally, no pleural effusions.  EKG 100 bpm, left axis deviation, left bundle branch block, qtc 538, sinus rhythm with PVC and PAC, no significant ST segment or T wave changes.   Patient was placed on IV furosemide for diuresis.   12/01 patient with improvement in volume status.  12/02 patient developed atrial fibrillation with rapid ventricular response.  12/03 patient has converted to sinus rhythm, pending renal function to improve.

## 2023-06-08 NOTE — Assessment & Plan Note (Addendum)
Echocardiogram with reduced LV systolic function with EF 20 to 25%, global hypokinesis with akinetic apex. LV cavity with mild dilatation, mild LVH, RV systolic function is preserved, RVSP 16.8 mmHg, LA with severe dilatation, mild to moderate mitral regurgitation, aortic valve with moderate to severe regurgitation, mild aortic stenosis,   Urine output not documented, patient continue to have volume overload on physical examination.  Systolic blood pressure 140 to 130 mmHg.   Plan to increase furosemide to 60 mg IV bid Add SGLT 2 inh.  Continue carvedilol low dose with close monitoring of hemodynamics.  After load reduction with hydralazine,   Follow up on cardiology recommendations, regardless valvular heart disease. Considering her COPD likely not candidate for invasive interventions.   Troponin elevation likely due to heart failure exacerbation, considering new reduction in LV systolic function and akinetic apex, patient will need ischemic workup, in the differential diagnosis stress induced cardiomyopathy.   Acute hypoxemic respiratory failure due to acute cardiogenic pulmonary edema.  Plan to continue diuresis, supplemental 02 per [place] to keep 02 saturation 88% or greater.

## 2023-06-08 NOTE — Assessment & Plan Note (Addendum)
No clinical signs of exacerbation. Plan to continue bronchodilator therapy. Hold on  high dose of systemic corticosteroids.  Oxymetry monitoring.

## 2023-06-08 NOTE — Assessment & Plan Note (Signed)
Continue with aspirin and statin therapy ? ?

## 2023-06-08 NOTE — Assessment & Plan Note (Addendum)
Hold on amlodipine, will start patient on RAAS inhibition during this hospitalization,  Continue low dose carvedilol and tid hydralazine,.

## 2023-06-08 NOTE — Assessment & Plan Note (Signed)
Will plan for IV iron during this hospitalization.   Myeloproliferative neoplasm.  Essential thrombocytosis, continue with hydroxyurea.  Follow up cell count.

## 2023-06-08 NOTE — Progress Notes (Signed)
Progress Note   Patient: Alexis Murphy YQM:[id number] DOB: 12/27/1945 DOA: 06/07/2023     1 DOS: the patient was seen and examined on 06/08/2023   Brief hospital course: Mrs. Shirah was admitted to the hospital with the working diagnosis of heart failure exacerbation.   77 yo female with the past medical history of sarcoidosis, CKD stage 3b, hypertension, hypothyroid and dyslipidemia who presented with dyspnea. Patient had a acute onset of severe dyspnea, associated with wheezing. Symptoms were refractive to inhaled albuterol. Because severe symptoms EMS was called. She was found with 02 saturation 80%, she was placed on Cpap and was transported to the ED. On her initial physical examination her blood pressure was 166/61, HR 106, RR 27 and 02 saturation 89%, lungs with expiratory wheezing, and distant breath sounds, heart with S1 and S2 present, regular with no murmurs or gallops, abdomen with no distention and trace lower extremity edema.   ABG 7.24/ 35/ 53/ 15/ 82%  Na 140 K 3,7 CL 112 bicarbonate 14, glucose 231 BUN 36  Cr 2,0  Anion gap 14  AST 42, ALT 25  BNP 967 High sensitive troponin 95 and 169  Lactic acid 3,5 and 1.3  Wbc 14.3 hgb 10,0 plt 579   Chest radiograph with hyperinflation, right rotation, bilateral hilar vascular congestion and dense interstitial infiltrates centrally, no pleural effusions.  EKG 100 bpm, left axis deviation, left bundle branch block, qtc 538, sinus rhythm with PVC and PAC, no significant ST segment or T wave changes.   Patient was placed on IV furosemide for diuresis.     Assessment and Plan: Acute on chronic systolic CHF (congestive heart failure) (HCC) Echocardiogram with reduced LV systolic function with EF 20 to 25%, global hypokinesis with akinetic apex. LV cavity with mild dilatation, mild LVH, RV systolic function is preserved, RVSP 16.8 mmHg, LA with severe dilatation, mild to moderate mitral regurgitation, aortic valve with moderate to  severe regurgitation, mild aortic stenosis,   Urine output not documented, patient continue to have volume overload on physical examination.  Systolic blood pressure 140 to 130 mmHg.   Plan to increase furosemide to 60 mg IV bid Add SGLT 2 inh.  Continue carvedilol low dose with close monitoring of hemodynamics.  After load reduction with hydralazine,   Follow up on cardiology recommendations, regardless valvular heart disease. Considering her COPD likely not candidate for invasive interventions.   Troponin elevation likely due to heart failure exacerbation, considering new reduction in LV systolic function and akinetic apex, patient will need ischemic workup, in the differential diagnosis stress induced cardiomyopathy.   Acute hypoxemic respiratory failure due to acute cardiogenic pulmonary edema.  Plan to continue diuresis, supplemental 02 per [place] to keep 02 saturation 88% or greater.   AKI (acute kidney injury) (HCC) CKD stage 3b. Hypokalemia   Renal function with serum cr at 1,90 with K at 3,3 and serum bicarbonate 18,  Na 139   Plan to continue diuresis with furosemide, add SGLT 2 inh.  Continue K correction with Kcl 40 meq x2 Follow up renal function and electrolytes in am.   COPD (chronic obstructive pulmonary disease) (HCC) No clinical signs of exacerbation. Plan to continue bronchodilator therapy. Hold on  high dose of systemic corticosteroids.  Oxymetry monitoring.   Sarcoidosis Plan to resume prednisone 5 mg daily.    PAD (peripheral artery disease) (HCC) Continue with aspirin and statin therapy.   Essential hypertension Hold on amlodipine, will start patient on RAAS inhibition during this hospitalization,  Continue low dose carvedilol and tid hydralazine,.  Iron deficiency anemia Will plan for IV iron during this hospitalization.   Essential thrombocytosis, continue with hydroxyurea.  Follow up cell count.   Hypothyroidism Continue with  levothyroxine         Subjective: Patient with improvement in her dyspnea but not back to baseline, no chest pain.   Physical Exam: Vitals:   06/08/23 0801 06/08/23 0803 06/08/23 0824 06/08/23 0839  BP: 135/73 135/73 135/73   Pulse: 91 91 91 90  Resp: 18 18  20   Temp: 98 F (36.7 C) 98 F (36.7 C)    TempSrc:      SpO2: 98% 98%    Weight:      Height:       Neurology awake and alert ENT With mild pallor Cardiovascular with S1 and S2 present and regular with no gallops, positive systolic murmur at the apex  Mild to moderate JVD Respiratory with prolonged expiratory phase with scattered rales, positive expiratory wheezing  Abdomen with no distention  No lower extremity edema  Data Reviewed:    Family Communication: no family at the bedside   Disposition: Status is: Inpatient Remains inpatient appropriate because: IV diuresis   Planned Discharge Destination: Home      Author: Coralie Keens, MD 06/08/2023 10:52 AM  For on call review [email or web address].

## 2023-06-08 NOTE — Assessment & Plan Note (Signed)
Continue with levothyroxine  

## 2023-06-08 NOTE — Progress Notes (Signed)
Rounding Note    Patient Name: KEYONIA DEDEAUX Date of Encounter: 06/08/2023  [facility] Cardiologist: Chrystie Nose, MD   Subjective   SOB improving but not resolved  Inpatient Medications    Scheduled Meds:  aspirin EC  81 mg Oral Daily   atorvastatin  80 mg Oral QPM   carvedilol  3.125 mg Oral BID WC   empagliflozin  10 mg Oral Daily   enoxaparin (LOVENOX) injection  30 mg Subcutaneous Q24H   folic acid  1 mg Oral Daily   furosemide  60 mg Intravenous Q12H   hydrALAZINE  25 mg Oral Q8H   hydroxyurea  500 mg Oral Q M,W,F   leflunomide  20 mg Oral Daily   levothyroxine  50 mcg Oral Q24H   melatonin  5 mg Oral QHS   pantoprazole  40 mg Oral Daily   potassium chloride  40 mEq Oral Q4H   predniSONE  5 mg Oral Daily   umeclidinium bromide  1 puff Inhalation Daily   venlafaxine XR  150 mg Oral QPM   Continuous Infusions:  PRN Meds: acetaminophen **OR** acetaminophen, ipratropium-albuterol, ondansetron **OR** ondansetron (ZOFRAN) IV, senna-docusate   Vital Signs    Vitals:   06/08/23 0801 06/08/23 0803 06/08/23 0824 06/08/23 0839  BP: 135/73 135/73 135/73   Pulse: 91 91 91 90  Resp: 18 18  20   Temp: 98 F (36.7 C) 98 F (36.7 C)    TempSrc:      SpO2: 98% 98%    Weight:      Height:       No intake or output data in the 24 hours ending 06/08/23 1231    06/08/2023    4:48 AM 06/07/2023    6:39 AM 05/13/2023    2:00 PM  Last 3 Weights  Weight (lbs) 121 lb 4 oz 121 lb 4.1 oz 121 lb 8 oz  Weight (kg) 54.999 kg 55 kg 55.112 kg      Telemetry    SR, 10 beat run SVT with underlying LBBB - Personally Reviewed  ECG    N/a - Personally Reviewed  Physical Exam   GEN: No acute distress.   Neck: No JVD Cardiac: RRR, 2/6 systolic murmur rusb Respiratory: + bilateral crackles GI: Soft, nontender, non-distended  MS: No edema; No deformity. Neuro:  Nonfocal  Psych: Normal affect   Labs    High Sensitivity Troponin:   Recent Labs  Lab  06/07/23 0630 06/07/23 0822 06/07/23 1225 06/07/23 1429  TROPONINIHS 95* 169* 294* 291*     Chemistry Recent Labs  Lab 06/07/23 0625 06/07/23 0630 06/07/23 1225 06/08/23 0439  NA 140 140  --  139  K 3.7 3.7  --  3.3*  CL  --  112*  --  111  CO2  --  14*  --  18*  GLUCOSE  --  231*  --  95  BUN  --  36*  --  36*  CREATININE  --  2.07*  --  1.90*  CALCIUM  --  8.5*  --  8.6*  MG  --   --  2.4  --   PROT  --  6.2*  --   --   ALBUMIN  --  3.4*  --   --   AST  --  42*  --   --   ALT  --  25  --   --   ALKPHOS  --  76  --   --  BILITOT  --  0.4  --   --   GFRNONAA  --  24*  --  27*  ANIONGAP  --  14  --  10    Lipids No results for input(s): "CHOL", "TRIG", "HDL", "LABVLDL", "LDLCALC", "CHOLHDL" in the last 168 hours.  Hematology Recent Labs  Lab 06/07/23 0625 06/07/23 0630 06/08/23 0439  WBC  --  14.3* 13.7*  RBC  --  3.39* 3.04*  HGB 9.9* 10.0* 9.0*  HCT 29.0* 33.0* 28.0*  MCV  --  97.3 92.1  MCH  --  29.5 29.6  MCHC  --  30.3 32.1  RDW  --  14.4 14.4  PLT  --  579* 501*   Thyroid  Recent Labs  Lab 06/07/23 1225  TSH 1.657    BNP Recent Labs  Lab 06/07/23 0630  BNP 967.4*    DDimer No results for input(s): "DDIMER" in the last 168 hours.   Radiology    CT Angio Chest/Abd/Pel for Dissection W and/or W/WO  Result Date: 06/07/2023 CLINICAL DATA:  Shortness of breath. EXAM: CT ANGIOGRAPHY CHEST, ABDOMEN AND PELVIS TECHNIQUE: Non-contrast CT of the chest was initially obtained. Multidetector CT imaging through the chest, abdomen and pelvis was performed using the standard protocol during bolus administration of intravenous contrast. Multiplanar reconstructed images and MIPs were obtained and reviewed to evaluate the vascular anatomy. RADIATION DOSE REDUCTION: This exam was performed according to the departmental dose-optimization program which includes automated exposure control, adjustment of the mA and/or kV according to patient size and/or use of  iterative reconstruction technique. CONTRAST:  75mL OMNIPAQUE IOHEXOL 350 MG/ML SOLN COMPARISON:  March 21, 2022.  July 26, 2019. FINDINGS: CTA CHEST FINDINGS Cardiovascular: Atherosclerosis of thoracic aorta is noted without aneurysm or dissection. Great vessels are widely patent without significant stenosis. Normal cardiac size. No pericardial effusion. Mild coronary artery calcifications are noted. Mediastinum/Nodes: Moderate size sliding-type hiatal hernia is noted. No adenopathy. Thyroid gland is unremarkable. Lungs/Pleura: Mild bilateral pleural effusions are noted with adjacent subsegmental atelectasis. No pneumothorax is noted. Patchy airspace opacities are noted in both upper lobes and to some degree left lower lobe concerning for multifocal pneumonia or possibly edema. Musculoskeletal: No chest wall abnormality. No acute or significant osseous findings. Review of the MIP images confirms the above findings. CTA ABDOMEN AND PELVIS FINDINGS VASCULAR Aorta: Atherosclerosis of abdominal aorta is noted without aneurysm or dissection. Celiac: Patent without evidence of aneurysm, dissection, vasculitis or significant stenosis. SMA: Patent without evidence of aneurysm, dissection, vasculitis or significant stenosis. Renals: Moderate narrowing of the origin of right renal artery is noted secondary to calcified plaque. Moderate narrowing of proximal left renal artery is noted secondary to calcified plaque. IMA: Patent without evidence of aneurysm, dissection, vasculitis or significant stenosis. Inflow: Patent without evidence of aneurysm, dissection, vasculitis or significant stenosis. Veins: No obvious venous abnormality within the limitations of this arterial phase study. Review of the MIP images confirms the above findings. NON-VASCULAR Hepatobiliary: Cholelithiasis. No biliary dilatation. Liver is unremarkable. Pancreas: Unremarkable. No pancreatic ductal dilatation or surrounding inflammatory changes.  Spleen: Normal in size without focal abnormality. Adrenals/Urinary Tract: Adrenal glands appear normal. Right renal cyst is noted for which no further follow-up is required. No hydronephrosis or renal obstruction is noted. Urinary bladder is unremarkable. Stomach/Bowel: There is no evidence of bowel obstruction or inflammation. The appendix appears normal. Lymphatic: No significant adenopathy is noted. Reproductive: Status post hysterectomy. No adnexal masses. Other: No abdominal wall hernia or abnormality. No abdominopelvic ascites.  Musculoskeletal: No acute or significant osseous findings. Review of the MIP images confirms the above findings. IMPRESSION: No evidence of thoracic or abdominal aortic dissection or aneurysm. Moderate stenoses are seen involving the proximal portions of both renal arteries secondary to calcified plaque. Moderate size sliding-type hiatal hernia. Mild bilateral pleural effusions are noted with adjacent subsegmental atelectasis. Patchy airspace opacities are noted in both upper lobes and to some degree left lower lobe consistent with multifocal pneumonia or possibly edema. Cholelithiasis. Mild coronary artery calcifications are noted. Aortic Atherosclerosis (ICD10-I70.0). Electronically Signed   By: Lupita Raider M.D.   On: 06/07/2023 14:22   ECHOCARDIOGRAM COMPLETE  Result Date: 06/07/2023    ECHOCARDIOGRAM REPORT   Patient Name:   STARLEEN COMELLA Date of Exam: 06/07/2023 Medical Rec #:  [id number]       Height:       59.0 in Accession #:    [id number]      Weight:       121.3 lb Date of Birth:  11-28-1945       BSA:          1.491 m Patient Age:    77 years        BP:           155/74 mmHg Patient Gender: F               HR:           113 bpm. Exam Location:  Inpatient Procedure: 2D Echo, Color Doppler, Cardiac Doppler and Intracardiac            Opacification Agent Indications:     Dyspnea  History:         Patient has prior history of Echocardiogram examinations, most                   recent 07/30/2019.  Sonographer:     Darlys Gales Referring Phys:  [id number] PROSPER M AMPONSAH Diagnosing Phys: Mary Kerim Statzer IMPRESSIONS  1. 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 The left ventricle demonstrates global hypokinesis. The left ventricular internal cavity size was mildly dilated. There is mild left ventricular hypertrophy. Left ventricular diastolic parameters are indeterminate.  2. Right ventricular systolic function is normal. The right ventricular size is normal. There is normal pulmonary artery systolic pressure. The estimated right ventricular systolic pressure is 16.8 mmHg.  3. Left atrial size was severely dilated.  4. The mitral valve is degenerative. Mild to moderate mitral valve regurgitation.  5. The aortic valve was not well visualized. Aortic valve regurgitation is moderate to severe. Mild aortic valve stenosis.  6. Not well visualized.  7. The inferior vena cava is normal in size with greater than 50% respiratory variability, suggesting right atrial pressure of 3 mmHg. Conclusion(s)/Recommendation(s): Compared to prior echo, EF has declined. AI is at least moderate. Recommend CT chest to rule out dissection, the ascending aorta was not well visualized. 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 The left ventricle demonstrates global hypokinesis. The left ventricular internal cavity size was mildly dilated. There is mild left ventricular hypertrophy. Left ventricular diastolic parameters are indeterminate. Right Ventricle: The right ventricular size is normal. Right ventricular systolic function is normal. There is normal pulmonary artery systolic pressure. The tricuspid regurgitant velocity is 1.86 m/s, and with an assumed  right atrial pressure of 3 mmHg,  the estimated right  ventricular systolic pressure is 16.8 mmHg. Left Atrium: Left atrial size was severely dilated. Right Atrium: Right atrial size was normal in size. Pericardium: There is no evidence of pericardial effusion. Mitral Valve: The mitral valve is degenerative in appearance. Mild to moderate mitral valve regurgitation. Tricuspid Valve: Tricuspid valve regurgitation is trivial. Aortic Valve: The aortic valve was not well visualized. Aortic valve regurgitation is moderate to severe. Aortic regurgitation PHT measures 250 msec. Mild aortic stenosis is present. Aortic valve mean gradient measures 13.0 mmHg. Aortic valve peak gradient measures 21.0 mmHg. Aortic valve area, by VTI measures 1.08 cm. Aorta: Not well visualized. Venous: The inferior vena cava is normal in size with greater than 50% respiratory variability, suggesting right atrial pressure of 3 mmHg. IAS/Shunts: The interatrial septum was not well visualized.  LEFT VENTRICLE PLAX 2D LVIDd:         5.10 cm LVIDs:         4.40 cm LV PW:         1.00 cm LV IVS:        1.10 cm LVOT diam:     1.80 cm LV SV:         43 LV SV Index:   29 LVOT Area:     2.54 cm  RIGHT VENTRICLE RV S prime:     13.30 cm/s TAPSE (M-mode): 2.1 cm LEFT ATRIUM           Index        RIGHT ATRIUM           Index LA Vol (A4C): 67.4 ml 45.20 ml/m  RA Area:     11.00 cm                                    RA Volume:   20.50 ml  13.75 ml/m  AORTIC VALVE AV Area (Vmax):    1.10 cm AV Area (Vmean):   1.03 cm AV Area (VTI):     1.08 cm AV Vmax:           229.00 cm/s AV Vmean:          172.000 cm/s AV VTI:            0.397 m AV Peak Grad:      21.0 mmHg AV Mean Grad:      13.0 mmHg LVOT Vmax:         99.00 cm/s LVOT Vmean:        69.900 cm/s LVOT VTI:          0.168 m LVOT/AV VTI ratio: 0.42 AI PHT:            250 msec MITRAL VALVE                TRICUSPID VALVE MV Area (PHT): 4.68 cm     TR Peak grad:   13.8 mmHg MV Decel Time: 162 msec     TR Vmax:        186.00 cm/s MV E velocity: 161.00 cm/s                              SHUNTS  Systemic VTI:  0.17 m                             Systemic Diam: 1.80 cm Carolan Clines Electronically signed by Carolan Clines Signature Date/Time: 06/07/2023/11:58:50 AM    Final (Updated)    DG Chest Port 1 View  Result Date: 06/07/2023 CLINICAL DATA:  Shortness of breath EXAM: PORTABLE CHEST 1 VIEW COMPARISON:  11/28/2020 FINDINGS: Hazy opacification of the bilateral chest. Cardiomediastinal contours are distorted by rotation with accentuated ascending aorta and foreshortening of the cardiac size. No visible effusion or pneumothorax. IMPRESSION: Extensive and bilateral airspace disease favoring edema over infection. Electronically Signed   By: Tiburcio Pea M.D.   On: 06/07/2023 06:40    Cardiac Studies     Patient Profile     ANSLEIGH JAMMER is a 77 y.o. female with a hx of pulmonary sarcoidosis, CKD stage IIIb, osteoarthritis, HTN, HLD, hypothyroidism, chronic thrombocytopenia who is being seen 06/07/2023 for the evaluation of new CHF at the request of Dr. Kirke Corin.   Assessment & Plan    1.Acute HFrEF - Jan 2021 echo: LVEF 55-60%, indet diastolic, normal RV, mild AI - 05/2023 echo: LVEF 20-25%, global hypokinesis, indet diastolic fxn. Normal RV. Mild to mod MR, mod to severe AI, mild AS - 05/2023 CTA: No acute aortic pathology  - BNP 967, CXR bilateral edema -incomplete I/Os, weights unchanged over 24 hours. Subjectively she reports high uop.  Received IV lasix 40mg  yesterday, due for 60mg  bid today. Downtrend in Cr with diuresis consistent with venous congestion and HF. Continue IV lasix today  - medical therapy limited by renal dysfunction, GFR 27. Avoid ACE/ARB/ARNI/MRA. [place] for SGLT2i, continue for now.  - medical therapy with coreg 3.125mg  bid, jardiance 10mg  daily, hydral 25mg  tid. Add imdur 15mg  daily to her hydralazine.   - poor cath candidate with GFR at 27. Mild trop elevation on admission likely related to  HF.      2. CKD Appears baseline creatinine is around 1.5-1.6.    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 Normal RV. Mild to mod MR, mod to severe AI, mild AS - CTA no acute aortic pathology - review echo images. Respiratory status not stable to consider TEE for better eval of aortic valve.     4. COPD - per primary team  5. Wide complex tachycardia - short 10 beat run wide complex tachy, looks like SVT with her native LBBB as opposed to NSVT>  For questions or updates, please contact  [facility] Please consult [email or web address] for contact info under        Signed, Dina Rich, MD  06/08/2023, 12:31 PM

## 2023-06-09 DIAGNOSIS — I5023 Acute on chronic systolic (congestive) heart failure: Secondary | ICD-10-CM | POA: Diagnosis not present

## 2023-06-09 DIAGNOSIS — J439 Emphysema, unspecified: Secondary | ICD-10-CM | POA: Diagnosis not present

## 2023-06-09 DIAGNOSIS — I5021 Acute systolic (congestive) heart failure: Secondary | ICD-10-CM | POA: Diagnosis not present

## 2023-06-09 DIAGNOSIS — D869 Sarcoidosis, unspecified: Secondary | ICD-10-CM | POA: Diagnosis not present

## 2023-06-09 DIAGNOSIS — N179 Acute kidney failure, unspecified: Secondary | ICD-10-CM | POA: Diagnosis not present

## 2023-06-09 LAB — BASIC METABOLIC PANEL
Anion gap: 9 (ref 5–15)
BUN: 39 mg/dL — ABNORMAL HIGH (ref 8–23)
CO2: 20 mmol/L — ABNORMAL LOW (ref 22–32)
Calcium: 8.9 mg/dL (ref 8.9–10.3)
Chloride: 110 mmol/L (ref 98–111)
Creatinine, Ser: 2.4 mg/dL — ABNORMAL HIGH (ref 0.44–1.00)
GFR, Estimated: 20 mL/min — ABNORMAL LOW (ref >60)
Glucose, Bld: 93 mg/dL (ref 70–99)
Potassium: 3.8 mmol/L (ref 3.5–5.1)
Sodium: 139 mmol/L (ref 135–145)

## 2023-06-09 LAB — MAGNESIUM: Magnesium: 2.1 mg/dL (ref 1.7–2.4)

## 2023-06-09 MED ORDER — IPRATROPIUM-ALBUTEROL 0.5-2.5 (3) MG/3ML IN SOLN
3.0000 mL | Freq: Four times a day (QID) | RESPIRATORY_TRACT | Status: DC
  Administered 2023-06-09: 3 mL via RESPIRATORY_TRACT
  Filled 2023-06-09: qty 3

## 2023-06-09 NOTE — Progress Notes (Signed)
Rounding Note    Patient Name: Alexis Murphy Date of Encounter: 06/09/2023  [facility] Cardiologist: Chrystie Nose, MD   Subjective   SOB is improving  Inpatient Medications    Scheduled Meds:  aspirin EC  81 mg Oral Daily   atorvastatin  80 mg Oral QPM   carvedilol  3.125 mg Oral BID WC   empagliflozin  10 mg Oral Daily   enoxaparin (LOVENOX) injection  30 mg Subcutaneous Q24H   folic acid  1 mg Oral Daily   hydrALAZINE  25 mg Oral Q8H   hydroxyurea  500 mg Oral Q M,W,F   isosorbide mononitrate  15 mg Oral Daily   leflunomide  20 mg Oral Daily   levothyroxine  50 mcg Oral Q24H   melatonin  5 mg Oral QHS   pantoprazole  40 mg Oral Daily   predniSONE  5 mg Oral Daily   umeclidinium bromide  1 puff Inhalation Daily   venlafaxine XR  150 mg Oral QPM   Continuous Infusions:  PRN Meds: acetaminophen **OR** acetaminophen, ipratropium-albuterol, ondansetron **OR** ondansetron (ZOFRAN) IV, senna-docusate   Vital Signs    Vitals:   06/09/23 0408 06/09/23 0744 06/09/23 0759 06/09/23 0817  BP:  136/63  136/63  Pulse:  93  93  Resp:  16    Temp:  98.4 F (36.9 C)    TempSrc:  Oral    SpO2:  96% 97%   Weight: 53.9 kg     Height:        Intake/Output Summary (Last 24 hours) at 06/09/2023 1101 Last data filed at 06/09/2023 0407 Gross per 24 hour  Intake --  Output 700 ml  Net -700 ml      06/09/2023    4:08 AM 06/08/2023    4:48 AM 06/07/2023    6:39 AM  Last 3 Weights  Weight (lbs) 118 lb 13.3 oz 121 lb 4 oz 121 lb 4.1 oz  Weight (kg) 53.9 kg 54.999 kg 55 kg      Telemetry    SR - Personally Reviewed  ECG    N/a - Personally Reviewed  Physical Exam   GEN: No acute distress.   Neck: No JVD Cardiac: RRR, 3/6 systolic murmur rusb Respiratory: Clear to auscultation bilaterally. GI: Soft, nontender, non-distended  MS: No edema; No deformity. Neuro:  Nonfocal  Psych: Normal affect   Labs    High Sensitivity Troponin:   Recent Labs   Lab 06/07/23 0630 06/07/23 0822 06/07/23 1225 06/07/23 1429  TROPONINIHS 95* 169* 294* 291*     Chemistry Recent Labs  Lab 06/07/23 0630 06/07/23 1225 06/08/23 0439 06/09/23 0545  NA 140  --  139 139  K 3.7  --  3.3* 3.8  CL 112*  --  111 110  CO2 14*  --  18* 20*  GLUCOSE 231*  --  95 93  BUN 36*  --  36* 39*  CREATININE 2.07*  --  1.90* 2.40*  CALCIUM 8.5*  --  8.6* 8.9  MG  --  2.4  --  2.1  PROT 6.2*  --   --   --   ALBUMIN 3.4*  --   --   --   AST 42*  --   --   --   ALT 25  --   --   --   ALKPHOS 76  --   --   --   BILITOT 0.4  --   --   --  GFRNONAA 24*  --  27* 20*  ANIONGAP 14  --  10 9    Lipids No results for input(s): "CHOL", "TRIG", "HDL", "LABVLDL", "LDLCALC", "CHOLHDL" in the last 168 hours.  Hematology Recent Labs  Lab 06/07/23 0625 06/07/23 0630 06/08/23 0439  WBC  --  14.3* 13.7*  RBC  --  3.39* 3.04*  HGB 9.9* 10.0* 9.0*  HCT 29.0* 33.0* 28.0*  MCV  --  97.3 92.1  MCH  --  29.5 29.6  MCHC  --  30.3 32.1  RDW  --  14.4 14.4  PLT  --  579* 501*   Thyroid  Recent Labs  Lab 06/07/23 1225  TSH 1.657    BNP Recent Labs  Lab 06/07/23 0630  BNP 967.4*    DDimer No results for input(s): "DDIMER" in the last 168 hours.   Radiology    CT Angio Chest/Abd/Pel for Dissection W and/or W/WO  Result Date: 06/07/2023 CLINICAL DATA:  Shortness of breath. EXAM: CT ANGIOGRAPHY CHEST, ABDOMEN AND PELVIS TECHNIQUE: Non-contrast CT of the chest was initially obtained. Multidetector CT imaging through the chest, abdomen and pelvis was performed using the standard protocol during bolus administration of intravenous contrast. Multiplanar reconstructed images and MIPs were obtained and reviewed to evaluate the vascular anatomy. RADIATION DOSE REDUCTION: This exam was performed according to the departmental dose-optimization program which includes automated exposure control, adjustment of the mA and/or kV according to patient size and/or use of iterative  reconstruction technique. CONTRAST:  75mL OMNIPAQUE IOHEXOL 350 MG/ML SOLN COMPARISON:  March 21, 2022.  July 26, 2019. FINDINGS: CTA CHEST FINDINGS Cardiovascular: Atherosclerosis of thoracic aorta is noted without aneurysm or dissection. Great vessels are widely patent without significant stenosis. Normal cardiac size. No pericardial effusion. Mild coronary artery calcifications are noted. Mediastinum/Nodes: Moderate size sliding-type hiatal hernia is noted. No adenopathy. Thyroid gland is unremarkable. Lungs/Pleura: Mild bilateral pleural effusions are noted with adjacent subsegmental atelectasis. No pneumothorax is noted. Patchy airspace opacities are noted in both upper lobes and to some degree left lower lobe concerning for multifocal pneumonia or possibly edema. Musculoskeletal: No chest wall abnormality. No acute or significant osseous findings. Review of the MIP images confirms the above findings. CTA ABDOMEN AND PELVIS FINDINGS VASCULAR Aorta: Atherosclerosis of abdominal aorta is noted without aneurysm or dissection. Celiac: Patent without evidence of aneurysm, dissection, vasculitis or significant stenosis. SMA: Patent without evidence of aneurysm, dissection, vasculitis or significant stenosis. Renals: Moderate narrowing of the origin of right renal artery is noted secondary to calcified plaque. Moderate narrowing of proximal left renal artery is noted secondary to calcified plaque. IMA: Patent without evidence of aneurysm, dissection, vasculitis or significant stenosis. Inflow: Patent without evidence of aneurysm, dissection, vasculitis or significant stenosis. Veins: No obvious venous abnormality within the limitations of this arterial phase study. Review of the MIP images confirms the above findings. NON-VASCULAR Hepatobiliary: Cholelithiasis. No biliary dilatation. Liver is unremarkable. Pancreas: Unremarkable. No pancreatic ductal dilatation or surrounding inflammatory changes. Spleen:  Normal in size without focal abnormality. Adrenals/Urinary Tract: Adrenal glands appear normal. Right renal cyst is noted for which no further follow-up is required. No hydronephrosis or renal obstruction is noted. Urinary bladder is unremarkable. Stomach/Bowel: There is no evidence of bowel obstruction or inflammation. The appendix appears normal. Lymphatic: No significant adenopathy is noted. Reproductive: Status post hysterectomy. No adnexal masses. Other: No abdominal wall hernia or abnormality. No abdominopelvic ascites. Musculoskeletal: No acute or significant osseous findings. Review of the MIP images confirms the above findings.  IMPRESSION: No evidence of thoracic or abdominal aortic dissection or aneurysm. Moderate stenoses are seen involving the proximal portions of both renal arteries secondary to calcified plaque. Moderate size sliding-type hiatal hernia. Mild bilateral pleural effusions are noted with adjacent subsegmental atelectasis. Patchy airspace opacities are noted in both upper lobes and to some degree left lower lobe consistent with multifocal pneumonia or possibly edema. Cholelithiasis. Mild coronary artery calcifications are noted. Aortic Atherosclerosis (ICD10-I70.0). Electronically Signed   By: Lupita Raider M.D.   On: 06/07/2023 14:22   ECHOCARDIOGRAM COMPLETE  Result Date: 06/07/2023    ECHOCARDIOGRAM REPORT   Patient Name:   Alexis Murphy Date of Exam: 06/07/2023 Medical Rec #:  [id number]       Height:       59.0 in Accession #:    [id number]      Weight:       121.3 lb Date of Birth:  18-Nov-1945       BSA:          1.491 m Patient Age:    77 years        BP:           155/74 mmHg Patient Gender: F               HR:           113 bpm. Exam Location:  Inpatient Procedure: 2D Echo, Color Doppler, Cardiac Doppler and Intracardiac            Opacification Agent Indications:     Dyspnea  History:         Patient has prior history of Echocardiogram examinations, most                   recent 07/30/2019.  Sonographer:     Darlys Gales Referring Phys:  [id number] PROSPER M AMPONSAH Diagnosing Phys: Mary Aren Cherne IMPRESSIONS  1. 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 The left ventricle demonstrates global hypokinesis. The left ventricular internal cavity size was mildly dilated. There is mild left ventricular hypertrophy. Left ventricular diastolic parameters are indeterminate.  2. Right ventricular systolic function is normal. The right ventricular size is normal. There is normal pulmonary artery systolic pressure. The estimated right ventricular systolic pressure is 16.8 mmHg.  3. Left atrial size was severely dilated.  4. The mitral valve is degenerative. Mild to moderate mitral valve regurgitation.  5. The aortic valve was not well visualized. Aortic valve regurgitation is moderate to severe. Mild aortic valve stenosis.  6. Not well visualized.  7. The inferior vena cava is normal in size with greater than 50% respiratory variability, suggesting right atrial pressure of 3 mmHg. Conclusion(s)/Recommendation(s): Compared to prior echo, EF has declined. AI is at least moderate. Recommend CT chest to rule out dissection, the ascending aorta was not well visualized. 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 The left ventricle demonstrates global hypokinesis. The left ventricular internal cavity size was mildly dilated. There is mild left ventricular hypertrophy. Left ventricular diastolic parameters are indeterminate. Right Ventricle: The right ventricular size is normal. Right ventricular systolic function is normal. There is normal pulmonary artery systolic pressure. The tricuspid regurgitant velocity is 1.86 m/s, and with an assumed right atrial pressure of 3 mmHg,  the estimated right  ventricular systolic pressure is 16.8  mmHg. Left Atrium: Left atrial size was severely dilated. Right Atrium: Right atrial size was normal in size. Pericardium: There is no evidence of pericardial effusion. Mitral Valve: The mitral valve is degenerative in appearance. Mild to moderate mitral valve regurgitation. Tricuspid Valve: Tricuspid valve regurgitation is trivial. Aortic Valve: The aortic valve was not well visualized. Aortic valve regurgitation is moderate to severe. Aortic regurgitation PHT measures 250 msec. Mild aortic stenosis is present. Aortic valve mean gradient measures 13.0 mmHg. Aortic valve peak gradient measures 21.0 mmHg. Aortic valve area, by VTI measures 1.08 cm. Aorta: Not well visualized. Venous: The inferior vena cava is normal in size with greater than 50% respiratory variability, suggesting right atrial pressure of 3 mmHg. IAS/Shunts: The interatrial septum was not well visualized.  LEFT VENTRICLE PLAX 2D LVIDd:         5.10 cm LVIDs:         4.40 cm LV PW:         1.00 cm LV IVS:        1.10 cm LVOT diam:     1.80 cm LV SV:         43 LV SV Index:   29 LVOT Area:     2.54 cm  RIGHT VENTRICLE RV S prime:     13.30 cm/s TAPSE (M-mode): 2.1 cm LEFT ATRIUM           Index        RIGHT ATRIUM           Index LA Vol (A4C): 67.4 ml 45.20 ml/m  RA Area:     11.00 cm                                    RA Volume:   20.50 ml  13.75 ml/m  AORTIC VALVE AV Area (Vmax):    1.10 cm AV Area (Vmean):   1.03 cm AV Area (VTI):     1.08 cm AV Vmax:           229.00 cm/s AV Vmean:          172.000 cm/s AV VTI:            0.397 m AV Peak Grad:      21.0 mmHg AV Mean Grad:      13.0 mmHg LVOT Vmax:         99.00 cm/s LVOT Vmean:        69.900 cm/s LVOT VTI:          0.168 m LVOT/AV VTI ratio: 0.42 AI PHT:            250 msec MITRAL VALVE                TRICUSPID VALVE MV Area (PHT): 4.68 cm     TR Peak grad:   13.8 mmHg MV Decel Time: 162 msec     TR Vmax:        186.00 cm/s MV E velocity: 161.00 cm/s                              SHUNTS                             Systemic VTI:  0.17 m  Systemic Diam: 1.80 cm Carolan Clines Electronically signed by Carolan Clines Signature Date/Time: 06/07/2023/11:58:50 AM    Final (Updated)     Cardiac Studies    Patient Profile     REDA PIWOWARSKI is a 77 y.o. female with a hx of pulmonary sarcoidosis, CKD stage IIIb, osteoarthritis, HTN, HLD, hypothyroidism, chronic thrombocytopenia who is being seen 06/07/2023 for the evaluation of new CHF at the request of Dr. Kirke Corin.   Assessment & Plan    1.Acute HFrEF - Jan 2021 echo: LVEF 55-60%, indet diastolic, normal RV, mild AI - 05/2023 echo: LVEF 20-25%, global hypokinesis, indet diastolic fxn. Normal RV. Mild to mod MR, mod to severe AI, mild AS - 05/2023 CTA: No acute aortic pathology related to AI   - BNP 967, CXR bilateral edema -incomplete I/Os. Subjectively she reports high uop. Weights down 3 lbs from yesterday. She is on IV lasix 60mg  bid, significant uptrend in Cr. Hold on further diuresis.   - medical therapy limited by renal dysfunction, GFR 20. Avoid ACE/ARB/ARNI/MRA. [place] for SGLT2i, continue for now.  - medical therapy with coreg 3.125mg  bid, jardiance 10mg  daily, hydral 25mg  tid imdur 15mg     - poor cath candidate with GFR at 20. Mild trop elevation on admission likely related to HF.   - history of sarcoid followed by rheumatology Dr Dierdre Forth. Limited notes in epic but mention elevated ACE level, +skin biopsy and breast biopsy, hilar adenopathy. Consider cMRI once respiratory status improved     2. CKD Appears baseline creatinine is around 1.5-1.6.      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 Normal RV. Mild to mod MR, mod to severe AI, mild AS - from review AVA 1.08, mean gradient 13, DI 0.42. Essentially low flow low gradient moderate AS.  - CTA no acute aortic pathology - Consider TEE as  respiratory status improves further evaluate AV       4. COPD -some wheezing today, wrote for nebs today   For questions or updates, please contact [facility] Please consult [email or web address] for contact info under        Signed, Dina Rich, MD  06/09/2023, 11:01 AM

## 2023-06-09 NOTE — Plan of Care (Signed)

## 2023-06-09 NOTE — Progress Notes (Addendum)
Progress Note   Patient: Alexis Murphy KGM:[id number] DOB: [birth year] DOA: 06/07/2023     2 DOS: the patient was seen and examined on 06/09/2023   Brief hospital course: Alexis Murphy was admitted to the hospital with the working diagnosis of heart failure exacerbation.   77 yo female with the past medical history of sarcoidosis, CKD stage 3b, hypertension, hypothyroid and dyslipidemia who presented with dyspnea. Patient had a acute onset of severe dyspnea, associated with wheezing. Symptoms were refractive to inhaled albuterol. Because severe symptoms EMS was called. She was found with 02 saturation 80%, she was placed on Cpap and was transported to the ED. On her initial physical examination her blood pressure was 166/61, HR 106, RR 27 and 02 saturation 89%, lungs with expiratory wheezing, and distant breath sounds, heart with S1 and S2 present, regular with no murmurs or gallops, abdomen with no distention and trace lower extremity edema.   ABG 7.24/ 35/ 53/ 15/ 82%  Na 140 K 3,7 CL 112 bicarbonate 14, glucose 231 BUN 36  Cr 2,0  Anion gap 14  AST 42, ALT 25  BNP 967 High sensitive troponin 95 and 169  Lactic acid 3,5 and 1.3  Wbc 14.3 hgb 10,0 plt 579   Chest radiograph with hyperinflation, right rotation, bilateral hilar vascular congestion and dense interstitial infiltrates centrally, no pleural effusions.  EKG 100 bpm, left axis deviation, left bundle branch block, qtc 538, sinus rhythm with PVC and PAC, no significant ST segment or T wave changes.   Patient was placed on IV furosemide for diuresis.   12/01 patient with improvement in volume status.   Assessment and Plan: Acute on chronic systolic CHF (congestive heart failure) (HCC) Echocardiogram with reduced LV systolic function with EF 20 to 25%, global hypokinesis with akinetic apex. LV cavity with mild dilatation, mild LVH, RV systolic function is preserved, RVSP 16.8 mmHg, LA with severe dilatation, mild to moderate  mitral regurgitation, aortic valve with moderate to severe regurgitation, mild aortic stenosis,   Documented urine output 700 cc, patient has lost about 1 Kg from yesterday.  Clinically with improved volume status.  Systolic blood pressure 140 to 130 mmHg.   Plan to continue carvedilol, SGLT 2 inh and hydralazine/ isosorbide.   Patient not candidate for invasive valvular intervention.   Troponin elevation likely due to heart failure exacerbation, considering new reduction in LV systolic function and akinetic apex, patient may need ischemic workup, in the differential diagnosis stress induced cardiomyopathy or sarcoid cardiomyopathy.    Acute hypoxemic respiratory failure due to acute cardiogenic pulmonary edema.  02 saturation today 97% on 2 L/min per [place]  Check ambulatory oxymetry on room air.   AKI (acute kidney injury) (HCC) CKD stage 3b. Hypokalemia   Renal function today with serum cr at 2,40 with K at 3,8 and serum bicarbonate at 20  Na 139 Mg 2.1   Continue with SGLT 2 inh for now .  Patient had 60 mg IV furosemide this am, will hold on pm dose and will follow up on renal function and electrolytes in am.   COPD (chronic obstructive pulmonary disease) (HCC) No clinical signs of exacerbation. Plan to continue bronchodilator therapy. Hold on  high dose of systemic corticosteroids.  Oxymetry monitoring.   Sarcoidosis Multisystemic sarcoidosis.  Raynaud's disease/ arthritis.   Plan to resume prednisone 5 mg daily.  Continue with    PAD (peripheral artery disease) (HCC) Continue with aspirin and statin therapy.   Essential hypertension Continue to hold  on amlodipine, possible RAAS inhibition during this hospitalization,  Continue low dose carvedilol and tid hydralazine.  Iron deficiency anemia Will plan for IV iron during this hospitalization.   Myeloproliferative neoplasm.  Essential thrombocytosis, continue with hydroxyurea.  Follow up cell count.    Hypothyroidism Continue with levothyroxine    Subjective: Patient with no chest pain, dyspnea has improved, she denies PND or orthopnea, no lower extremity edema   Physical Exam: Vitals:   06/09/23 0408 06/09/23 0744 06/09/23 0759 06/09/23 0817  BP:  136/63  136/63  Pulse:  93  93  Resp:  16    Temp:  98.4 F (36.9 C)    TempSrc:  Oral    SpO2:  96% 97%   Weight: 53.9 kg     Height:       Neurology awake and alert ENT with mild pallor Cardiovascular with S1 and S2 present and regular with positive diastolic murmur at the base (mild).  Mild JVD No lower extremity edema Respiratory with prolonged expiratory phase, signs of chronic air trapping with barrel chest, scattered rhonchi with no rales or wheezing Abdomen with no distention  Data Reviewed:    Family Communication: no family at the bedside   Disposition: Status is: Inpatient Remains inpatient appropriate because: diuresis heart failure, follow up renal function   Planned Discharge Destination: Home     Author: Coralie Keens, MD 06/09/2023 10:24 AM  For on call review [email or web address].

## 2023-06-10 ENCOUNTER — Other Ambulatory Visit (HOSPITAL_COMMUNITY): Payer: Self-pay

## 2023-06-10 ENCOUNTER — Telehealth (HOSPITAL_COMMUNITY): Payer: Self-pay | Admitting: Pharmacy Technician

## 2023-06-10 DIAGNOSIS — I5023 Acute on chronic systolic (congestive) heart failure: Secondary | ICD-10-CM | POA: Diagnosis not present

## 2023-06-10 DIAGNOSIS — I4891 Unspecified atrial fibrillation: Secondary | ICD-10-CM | POA: Diagnosis not present

## 2023-06-10 DIAGNOSIS — J439 Emphysema, unspecified: Secondary | ICD-10-CM | POA: Diagnosis not present

## 2023-06-10 DIAGNOSIS — D869 Sarcoidosis, unspecified: Secondary | ICD-10-CM | POA: Diagnosis not present

## 2023-06-10 DIAGNOSIS — N179 Acute kidney failure, unspecified: Secondary | ICD-10-CM | POA: Diagnosis not present

## 2023-06-10 LAB — BASIC METABOLIC PANEL
Anion gap: 12 (ref 5–15)
Anion gap: 15 (ref 5–15)
BUN: 41 mg/dL — ABNORMAL HIGH (ref 8–23)
BUN: 45 mg/dL — ABNORMAL HIGH (ref 8–23)
CO2: 21 mmol/L — ABNORMAL LOW (ref 22–32)
CO2: 18 mmol/L — ABNORMAL LOW (ref 22–32)
Calcium: 8.8 mg/dL — ABNORMAL LOW (ref 8.9–10.3)
Calcium: 9.1 mg/dL (ref 8.9–10.3)
Chloride: 108 mmol/L (ref 98–111)
Chloride: 107 mmol/L (ref 98–111)
Creatinine, Ser: 2.25 mg/dL — ABNORMAL HIGH (ref 0.44–1.00)
Creatinine, Ser: 2.36 mg/dL — ABNORMAL HIGH (ref 0.44–1.00)
GFR, Estimated: 22 mL/min — ABNORMAL LOW (ref >60)
GFR, Estimated: 21 mL/min — ABNORMAL LOW (ref >60)
Glucose, Bld: 82 mg/dL (ref 70–99)
Glucose, Bld: 99 mg/dL (ref 70–99)
Potassium: 3.4 mmol/L — ABNORMAL LOW (ref 3.5–5.1)
Potassium: 3.6 mmol/L (ref 3.5–5.1)
Sodium: 141 mmol/L (ref 135–145)
Sodium: 140 mmol/L (ref 135–145)

## 2023-06-10 MED ORDER — AMIODARONE HCL IN DEXTROSE 360-4.14 MG/200ML-% IV SOLN
60.0000 mg/h | INTRAVENOUS | Status: AC
  Administered 2023-06-10 (×2): 60 mg/h via INTRAVENOUS
  Filled 2023-06-10: qty 200

## 2023-06-10 MED ORDER — AMIODARONE LOAD VIA INFUSION
150.0000 mg | Freq: Once | INTRAVENOUS | Status: AC
  Administered 2023-06-10: 150 mg via INTRAVENOUS
  Filled 2023-06-10: qty 83.34

## 2023-06-10 MED ORDER — AMIODARONE HCL IN DEXTROSE 360-4.14 MG/200ML-% IV SOLN
30.0000 mg/h | INTRAVENOUS | Status: DC
  Administered 2023-06-10 – 2023-06-12 (×4): 30 mg/h via INTRAVENOUS
  Filled 2023-06-10 (×4): qty 200

## 2023-06-10 MED ORDER — APIXABAN 2.5 MG PO TABS: 2.5000 mg | ORAL_TABLET | Freq: Two times a day (BID) | ORAL | Status: DC

## 2023-06-10 MED ORDER — CARVEDILOL 12.5 MG PO TABS
12.5000 mg | ORAL_TABLET | Freq: Two times a day (BID) | ORAL | Status: DC
  Administered 2023-06-10 – 2023-06-13 (×6): 12.5 mg via ORAL
  Filled 2023-06-10 (×6): qty 1

## 2023-06-10 MED ORDER — ENSURE ENLIVE PO LIQD: 237.0000 mL | Freq: Two times a day (BID) | ORAL | Status: DC

## 2023-06-10 MED ORDER — APIXABAN 2.5 MG PO TABS
2.5000 mg | ORAL_TABLET | Freq: Two times a day (BID) | ORAL | Status: DC
  Administered 2023-06-10 – 2023-06-13 (×7): 2.5 mg via ORAL
  Filled 2023-06-10 (×8): qty 1

## 2023-06-10 MED ORDER — CARVEDILOL 6.25 MG PO TABS
6.2500 mg | ORAL_TABLET | Freq: Once | ORAL | Status: AC
  Administered 2023-06-10: 6.25 mg via ORAL
  Filled 2023-06-10: qty 1

## 2023-06-10 MED ORDER — POTASSIUM CHLORIDE CRYS ER 20 MEQ PO TBCR
40.0000 meq | EXTENDED_RELEASE_TABLET | Freq: Once | ORAL | Status: AC
  Administered 2023-06-10: 40 meq via ORAL
  Filled 2023-06-10: qty 2

## 2023-06-10 MED ORDER — METOPROLOL TARTRATE 5 MG/5ML IV SOLN
5.0000 mg | Freq: Once | INTRAVENOUS | Status: AC
  Administered 2023-06-10: 5 mg via INTRAVENOUS
  Filled 2023-06-10: qty 5

## 2023-06-10 NOTE — Progress Notes (Addendum)
Mobility Specialist Progress Note:   06/10/23 1028  Mobility  Activity Ambulated independently to bathroom  Level of Assistance Independent  Assistive Device None  Distance Ambulated (ft) 15 ft  Activity Response Tolerated well  Mobility Referral Yes  $Mobility charge 1 Mobility  Mobility Specialist Start Time (ACUTE ONLY) 1025  Mobility Specialist Stop Time (ACUTE ONLY) 1028  Mobility Specialist Time Calculation (min) (ACUTE ONLY) 3 min   Responded to pt's bed alarm, pt requesting assistance to bathroom. Found with [place] off, denies SOB and dizziness. Ambulated independently in bathroom after set-up. Tolerated well, asx throughout. Left pt in  bathroom, RN in room. All needs met.   Feliciana Rossetti Mobility Specialist Please contact via Special educational needs teacher or  Rehab office at [phone]

## 2023-06-10 NOTE — Progress Notes (Signed)
Patient arrived from 22M.  Patient is actively in Afib with RVR.  Amiodarone was started, as prescribed.  Patient states she has no pain.    06/10/23 1146  Vitals  Temp 97.8 F (36.6 C)  Temp Source Oral  BP (!) 135/121  MAP (mmHg) 127  BP Location Right Arm  BP Method Automatic  Patient Position (if appropriate) Lying  Pulse Rate (!) 154  Pulse Rate Source Monitor  Resp 19  Level of Consciousness  Level of Consciousness Alert  MEWS COLOR  MEWS Score Color Yellow  Oxygen Therapy  SpO2 97 %  O2 Device Nasal Cannula  O2 Flow Rate (L/min) 2 L/min  Patient Activity (if Appropriate) In bed  Pulse Oximetry Type Continuous  Pain Assessment  Pain Scale 0-10  Pain Score 0  MEWS Score  MEWS Temp 0  MEWS Systolic 0  MEWS Pulse 3  MEWS RR 0  MEWS LOC 0  MEWS Score 3

## 2023-06-10 NOTE — Plan of Care (Addendum)
Secure chatted Dr. Ella Jubilee about Heart rate of the patient, waiting for response ; Spoke with cardiologist [facility] about heart rate of patient on afib with RVR, BP stable of 116/72, denies chest pain, patient is alert and oriented x 4, ordered to give coreg byt hold the imdur ; Called Dr. Ella Jubilee number in chart ; spoke with admin office secretary, told this RN that she willl page Dr. Ella Jubilee [phone] Dr. Ella Jubilee called this RN, ordered to give metoprolol 5mg  IV 0901H Dr. Ella Jubilee at the bedside ; noted patient is still on afib with RVR 130-140's BP of 133/87 , will give extra dose of coreg  Problem: Education: Goal: Knowledge of General Education information will improve Description: Including pain rating scale, medication(s)/side effects and non-pharmacologic comfort measures Outcome: Progressing   Problem: Health Behavior/Discharge Planning: Goal: Ability to manage health-related needs will improve Outcome: Progressing   Problem: Clinical Measurements: Goal: Ability to maintain clinical measurements within normal limits will improve Outcome: Progressing Goal: Will remain free from infection Outcome: Progressing Goal: Diagnostic test results will improve Outcome: Progressing Goal: Respiratory complications will improve Outcome: Progressing Goal: Cardiovascular complication will be avoided Outcome: Progressing   Problem: Activity: Goal: Risk for activity intolerance will decrease Outcome: Progressing   Problem: Nutrition: Goal: Adequate nutrition will be maintained Outcome: Progressing   Problem: Coping: Goal: Level of anxiety will decrease Outcome: Progressing   Problem: Elimination: Goal: Will not experience complications related to bowel motility Outcome: Progressing Goal: Will not experience complications related to urinary retention Outcome: Progressing   Problem: Pain Management: Goal: General experience of comfort will improve Outcome: Progressing   Problem:  Safety: Goal: Ability to remain free from injury will improve Outcome: Progressing   Problem: Skin Integrity: Goal: Risk for impaired skin integrity will decrease Outcome: Progressing

## 2023-06-10 NOTE — Progress Notes (Signed)
Rounding Note    Patient Name: Alexis Murphy Date of Encounter: 06/10/2023  [facility] Cardiologist: Chrystie Nose, MD   Subjective   Resting in bed  No complains - feels better compared to this morning  No palpitations.  Remains in Afib w/ RVR.   Inpatient Medications    Scheduled Meds:  apixaban  2.5 mg Oral BID   aspirin EC  81 mg Oral Daily   atorvastatin  80 mg Oral QPM   carvedilol  12.5 mg Oral BID WC   empagliflozin  10 mg Oral Daily   feeding supplement  237 mL Oral BID BM   folic acid  1 mg Oral Daily   hydroxyurea  500 mg Oral Q M,W,F   leflunomide  20 mg Oral Daily   levothyroxine  50 mcg Oral Q24H   melatonin  5 mg Oral QHS   pantoprazole  40 mg Oral Daily   predniSONE  5 mg Oral Daily   umeclidinium bromide  1 puff Inhalation Daily   venlafaxine XR  150 mg Oral QPM   Continuous Infusions:  amiodarone 59.94 mg/hr (06/10/23 1257)   Followed by   amiodarone     PRN Meds: acetaminophen **OR** acetaminophen, ipratropium-albuterol, ondansetron **OR** ondansetron (ZOFRAN) IV, senna-docusate   Vital Signs    Vitals:   06/10/23 1146 06/10/23 1300 06/10/23 1330 06/10/23 1337  BP: (!) 135/121   102/70  Pulse: (!) 154 (!) 46 90 92  Resp: 19 20 17 17   Temp: 97.8 F (36.6 C)     TempSrc: Oral     SpO2: 97% 97% 98% 91%  Weight:      Height:        Intake/Output Summary (Last 24 hours) at 06/10/2023 1402 Last data filed at 06/10/2023 1257 Gross per 24 hour  Intake 83.32 ml  Output 900 ml  Net -816.68 ml      06/09/2023    4:08 AM 06/08/2023    4:48 AM 06/07/2023    6:39 AM  Last 3 Weights  Weight (lbs) 118 lb 13.3 oz 121 lb 4 oz 121 lb 4.1 oz  Weight (kg) 53.9 kg 54.999 kg 55 kg      Telemetry    Afib w/ RVR - Personally Reviewed  ECG    06/10/2023: A-fib with RVR, heart rate 159 bpm, left bundle branch block, rare PVCs.  - Personally Reviewed  Physical Exam   GEN: No acute distress.   Neck: No JVD Cardiac: Irregularly  irregular, positive S1-S2, no murmurs rubs or gallops appreciated secondary to tachycardia Respiratory: Clear to auscultation bilaterally.  No wheezes rales or rhonchi's. GI: Soft, nontender, non-distended  MS: No edema; No deformity. Neuro:  Nonfocal  Psych: Normal affect   Labs    High Sensitivity Troponin:   Recent Labs  Lab 06/07/23 0630 06/07/23 0822 06/07/23 1225 06/07/23 1429  TROPONINIHS 95* 169* 294* 291*     Chemistry Recent Labs  Lab 06/07/23 0630 06/07/23 1225 06/08/23 0439 06/09/23 0545 06/10/23 0738 06/10/23 0942  NA 140  --    < > 139 141 140  K 3.7  --    < > 3.8 3.4* 3.6  CL 112*  --    < > 110 108 107  CO2 14*  --    < > 20* 21* 18*  GLUCOSE 231*  --    < > 93 82 99  BUN 36*  --    < > 39* 41* 45*  CREATININE 2.07*  --    < > 2.40* 2.25* 2.36*  CALCIUM 8.5*  --    < > 8.9 8.8* 9.1  MG  --  2.4  --  2.1  --   --   PROT 6.2*  --   --   --   --   --   ALBUMIN 3.4*  --   --   --   --   --   AST 42*  --   --   --   --   --   ALT 25  --   --   --   --   --   ALKPHOS 76  --   --   --   --   --   BILITOT 0.4  --   --   --   --   --   GFRNONAA 24*  --    < > 20* 22* 21*  ANIONGAP 14  --    < > 9 12 15    < > = values in this interval not displayed.    Lipids No results for input(s): "CHOL", "TRIG", "HDL", "LABVLDL", "LDLCALC", "CHOLHDL" in the last 168 hours.  Hematology Recent Labs  Lab 06/07/23 0625 06/07/23 0630 06/08/23 0439  WBC  --  14.3* 13.7*  RBC  --  3.39* 3.04*  HGB 9.9* 10.0* 9.0*  HCT 29.0* 33.0* 28.0*  MCV  --  97.3 92.1  MCH  --  29.5 29.6  MCHC  --  30.3 32.1  RDW  --  14.4 14.4  PLT  --  579* 501*   Thyroid  Recent Labs  Lab 06/07/23 1225  TSH 1.657    BNP Recent Labs  Lab 06/07/23 0630  BNP 967.4*    DDimer No results for input(s): "DDIMER" in the last 168 hours.   Radiology    No results found.  Cardiac Studies   Echo 06/07/2023 1. 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 Left ventricular ejection fraction, by estimation,  is 20  to 25%. The left ventricle has severely decreased function. The left  ventricle demonstrates global hypokinesis. The left ventricular internal  cavity size was mildly dilated.  There is mild left ventricular hypertrophy. Left ventricular diastolic  parameters are indeterminate.   2. Right ventricular systolic function is normal. The right ventricular  size is normal. There is normal pulmonary artery systolic pressure. The  estimated right ventricular systolic pressure is 16.8 mmHg.   3. Left atrial size was severely dilated.   4. The mitral valve is degenerative. Mild to moderate mitral valve  regurgitation.   5. The aortic valve was not well visualized. Aortic valve regurgitation  is moderate to severe. Mild aortic valve stenosis.   6. Not well visualized.   7. The inferior vena cava is normal in size with greater than 50%  respiratory variability, suggesting right atrial pressure of 3 mmHg.   Patient Profile     SHENIQUA KOHNEN is a 77 y.o. female with a hx of pulmonary sarcoidosis, CKD stage IIIb, osteoarthritis, HTN, HLD, hypothyroidism, chronic thrombocytopenia who is being seen 06/07/2023 for the evaluation of new CHF at the request of Dr. Kirke Corin.   Assessment & Plan    1.Acute HFrEF Jan 2021 echo: LVEF 55-60%, indet diastolic, normal RV, mild AI 05/2023 echo: LVEF 20-25%, global hypokinesis, indet diastolic fxn. Normal RV. Mild to mod MR, mod to severe AI, mild AS 05/2023 CTA: No acute aortic pathology related to AI BNP 967, CXR bilateral edema  Net IO Since Admission: -2,116.68 mL [06/10/23 1402] Medical therapy limited by renal dysfunction.  Avoid ACE/ARB/ARNI/MRA. [place] for SGLT2i, continue for now.  Coreg changed from 3.125 mg p.o. twice daily to 12.5 mg p.o. twice daily. Currently on Jardiance. Hydralazine and Imdur held secondary to hypotension due to A-fib with RVR. Poor cath candidate with GFR at 21. Mild trop elevation on admission likely related to type II  ischemia. Monitor for now.     2. AKI on CKD Appears baseline creatinine is around 1.5-1.6.  Cr 2.4 today  Recommend nephrology consult  Currently not on nephrotoxic agents.   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 Mild to mod MR, mod to severe AI, mild AS from review AVA 1.08, mean gradient 13, DI 0.42. Essentially low flow low gradient moderate AS.  CTA no acute aortic pathology   4. Atrial fibrillation w/ RVR Newly discovered Rate control: Coreg (dose increased from 3.25mg  po bid to 12.5mg  bid this morning) Rhythm control: Amiodarone gtt started 06/10/2023 Thromboembolic prophylaxis: Eliquis 2.5 mg p.o. twice daily started 06/10/2023 (weight 54 kg, age 59, serum creatinine 2.4) N.p.o. after midnight - to be considered for TEE guided cardioversion EKG reviewed.  Continue telemetry  TSH 1.65 as of 06/07/23 - known hx of hypothyroidism, currently on synthroid.   5. Acute Hypoxemic respiratory failure Multifactorial: COPD, pulmonary sarcoidosis, new HFrEF, Afib rvr HFrEF management as discussed above. Atrial fibrillation management as discussed above. Defer other causes of hypoxic respiratory failure workup and management to primary team.  6. PAD: Denies claudication.  Continue ASA and statin   7. HTN:   Labile in the setting of A-fib with RVR. Focus on rate control strategy for now.  For questions or updates, please contact [facility] Please consult [email or web address] for contact info under   Plan of care care discussed w/ patient, RN, and attending physician.     Signed, Tessa Lerner, DO, [facility]  [facility]  [address] [place], [place] [address] Pager: [phone] Office: [phone] 2:02 PM 06/10/23

## 2023-06-10 NOTE — Assessment & Plan Note (Addendum)
Patient has been placed on amiodarone for rate and rhythm control.  She has converted to sinus rhythm  Plan to continue with amiodarone IV per protocol and transition to po. Possible discontinuation later on.  Continue anticoagulation with apixaban.

## 2023-06-10 NOTE — Telephone Encounter (Signed)
Patient Product/process development scientist completed.    The patient is insured through [employer]. Patient has Medicare and is not eligible for a copay card, but may be able to apply for patient assistance, if available.    Ran test claim for Eliquis 2.5 mg and the current 30 day co-pay is $47.00.   This test claim was processed through [facility]- copay amounts may vary at other pharmacies due to pharmacy/plan contracts, or as the patient moves through the different stages of their insurance plan.     Roland Earl, CPHT Pharmacy Technician III Certified Patient Advocate [facility] Pharmacy Patient Advocate Team Direct Number: [phone]  Fax: [phone]

## 2023-06-10 NOTE — Progress Notes (Addendum)
Progress Note   Patient: Alexis Murphy ONG:[id number] DOB: 09-07-1945 DOA: 06/07/2023     3 DOS: the patient was seen and examined on 06/10/2023   Brief hospital course: Mrs. Gazda was admitted to the hospital with the working diagnosis of heart failure exacerbation.   77 yo female with the past medical history of sarcoidosis, CKD stage 3b, hypertension, hypothyroid and dyslipidemia who presented with dyspnea. Patient had a acute onset of severe dyspnea, associated with wheezing. Symptoms were refractive to inhaled albuterol. Because severe symptoms EMS was called. She was found with 02 saturation 80%, she was placed on Cpap and was transported to the ED. On her initial physical examination her blood pressure was 166/61, HR 106, RR 27 and 02 saturation 89%, lungs with expiratory wheezing, and distant breath sounds, heart with S1 and S2 present, regular with no murmurs or gallops, abdomen with no distention and trace lower extremity edema.   ABG 7.24/ 35/ 53/ 15/ 82%  Na 140 K 3,7 CL 112 bicarbonate 14, glucose 231 BUN 36  Cr 2,0  Anion gap 14  AST 42, ALT 25  BNP 967 High sensitive troponin 95 and 169  Lactic acid 3,5 and 1.3  Wbc 14.3 hgb 10,0 plt 579   Chest radiograph with hyperinflation, right rotation, bilateral hilar vascular congestion and dense interstitial infiltrates centrally, no pleural effusions.  EKG 100 bpm, left axis deviation, left bundle branch block, qtc 538, sinus rhythm with PVC and PAC, no significant ST segment or T wave changes.   Patient was placed on IV furosemide for diuresis.   12/01 patient with improvement in volume status.   Assessment and Plan: Acute on chronic systolic CHF (congestive heart failure) (HCC) Echocardiogram with reduced LV systolic function with EF 20 to 25%, global hypokinesis with akinetic apex. LV cavity with mild dilatation, mild LVH, RV systolic function is preserved, RVSP 16.8 mmHg, LA with severe dilatation, mild to moderate  mitral regurgitation, aortic valve with moderate to severe regurgitation, mild aortic stenosis,   Documented urine output 900 cc,  Systolic blood pressure 140 to 130 mmHg.   Plan to continue SGLT 2 inh.  Will hold on hydralazine and isosorbide for now and will increase metoprolol for rate control atrial fibrillation.  If renal function stable will plan to add MRA, and RAS inhibition.   Patient not candidate for invasive valvular intervention.   Troponin elevation likely due to heart failure exacerbation, considering new reduction in LV systolic function and akinetic apex, patient may need ischemic workup, in the differential diagnosis stress induced cardiomyopathy or sarcoid cardiomyopathy.    Acute hypoxemic respiratory failure due to acute cardiogenic pulmonary edema.  02 saturation today 96% on 2 L/min per [place]  Check ambulatory oxymetry on room air.   AKI (acute kidney injury) (HCC) CKD stage 3b. Hypokalemia   Today renal function with serum cr at 2,25 with K at 3,4 and serum bicarbonate at 21 Na 141   Continue with SGLT 2 inh for now .  Continue to hold on furosemide for now Add 40 meq Kcl today Follow up renal function and electrolytes in am.   COPD (chronic obstructive pulmonary disease) (HCC) No clinical signs of exacerbation. Plan to continue bronchodilator therapy. Hold on  high dose of systemic corticosteroids.  Oxymetry monitoring.   Sarcoidosis Multisystemic sarcoidosis.  Raynaud's disease/ arthritis.   Plan to resume prednisone 5 mg daily.  Continue with leflunomide.    PAD (peripheral artery disease) (HCC) Continue with aspirin and statin therapy.  Essential hypertension Continue to hold on amlodipine, possible RAS inhibition during this hospitalization,  Continue low dose carvedilol.  Iron deficiency anemia Will plan for IV iron during this hospitalization.   Myeloproliferative neoplasm.  Essential thrombocytosis, continue with hydroxyurea.  Follow  up cell count.   Hypothyroidism Continue with levothyroxine   Atrial fibrillation (HCC) New onset of atrial fibrillation with RVR rate in the 150's with stable blood pressure.  EKG today with 159 bpm, left axis deviation, left bundle branch block, atrial fibrillation rhythm with one PVC, no significant ST segment or T wave changes.   Plan to give 5 mg IV metoprolol x1 Increase carvedilol dose. Continue telemetry monitoring.  Her Cha2ds2vasc score is 5, will start patient on anticoagulation with apixaban.   If continue with RvR likely she will need amiodarone drip for rate and rhythm control.         Subjective: Patient with no chest pain or palpitations, dyspnea has improved, no PND or orthopnea   Physical Exam: Vitals:   06/10/23 0811 06/10/23 0816 06/10/23 0832 06/10/23 0855  BP: 134/85  116/72 133/87  Pulse:      Resp:      Temp:      TempSrc:      SpO2:  96%    Weight:      Height:       Neurology awake and alert ENT with mild pallor Cardiovascular with S1 and S2 present, irregularly irregular with no gallops, rubs or murmurs No JVD No lower extremity edema Respiratory with no rales or wheezing, no rhonchi, prolonged expiratory phase  Abdomen with no distention  Data Reviewed:    Family Communication: no family at the bedside   Disposition: Status is: Inpatient Remains inpatient appropriate because: heart failure, atrial fibrillation,   Planned Discharge Destination: Home    Author: Coralie Keens, MD 06/10/2023 9:18 AM  For on call review [email or web address].

## 2023-06-10 NOTE — Progress Notes (Signed)
Pharmacist Heart Failure Core Measure Documentation  Assessment: Alexis Murphy has an EF documented as 20-25% on 06/07/23 by echo.  Rationale: Heart failure patients with left ventricular systolic dysfunction (LVSD) and an EF < 40% should be prescribed an angiotensin converting enzyme inhibitor (ACEI) or angiotensin receptor blocker (ARB) at discharge unless a contraindication is documented in the medical record.  This patient is not currently on an ACEI or ARB for HF.  This note is being placed in the record in order to provide documentation that a contraindication to the use of these agents is present for this encounter.  ACE Inhibitor or Angiotensin Receptor Blocker is contraindicated (specify all that apply)  []   ACEI allergy AND ARB allergy []   Angioedema []   Moderate or severe aortic stenosis []   Hyperkalemia []   Hypotension []   Renal artery stenosis [x]   Worsening renal function, preexisting renal disease or dysfunction  Roslyn Smiling, PharmD PGY1 Pharmacy Resident 06/10/2023 1:29 PM

## 2023-06-10 NOTE — Progress Notes (Addendum)
PHARMACY - ANTICOAGULATION CONSULT NOTE  Pharmacy Consult for Apixaban Indication: New onset atrial fibrillation  Allergies  Allergen Reactions   Penicillins Anaphylaxis and Rash    Did it involve swelling of the face/tongue/throat, SOB, or low BP? Yes  Did it involve sudden or severe rash/hives, skin peeling, or any reaction on the inside of your mouth or nose? No Did you need to seek medical attention at a hospital or doctor's office? No When did it last happen? Childhood If all above answers are "NO", may proceed with cephalosporin use.    Codeine Other (See Comments)    Vomiting    Pentazocine     Other reaction(s): itch    Patient Measurements: Height: 4\' 11"  (149.9 cm) Weight: 53.9 kg (118 lb 13.3 oz) IBW/kg (Calculated) : 43.2  Vital Signs: Temp: 98.4 F (36.9 C) (12/02 0551) Temp Source: Oral (12/02 0551) BP: 93/62 (12/02 0931) Pulse Rate: 133 (12/02 0931)  Labs: Recent Labs    06/07/23 1225 06/07/23 1429 06/08/23 0439 06/09/23 0545 06/10/23 0738  HGB  --   --  9.0*  --   --   HCT  --   --  28.0*  --   --   PLT  --   --  501*  --   --   CREATININE  --   --  1.90* 2.40* 2.25*  TROPONINIHS 294* 291*  --   --   --    Estimated Creatinine Clearance: 16 mL/min (A) (by C-G formula based on SCr of 2.25 mg/dL (H)).  Medical History: Past Medical History:  Diagnosis Date   Arthritis    Hyperlipidemia    Sarcoidosis    Medications:  Scheduled:   aspirin EC  81 mg Oral Daily   atorvastatin  80 mg Oral QPM   carvedilol  12.5 mg Oral BID WC   empagliflozin  10 mg Oral Daily   enoxaparin (LOVENOX) injection  30 mg Subcutaneous Q24H   feeding supplement  237 mL Oral BID BM   folic acid  1 mg Oral Daily   hydroxyurea  500 mg Oral Q M,W,F   leflunomide  20 mg Oral Daily   levothyroxine  50 mcg Oral Q24H   melatonin  5 mg Oral QHS   pantoprazole  40 mg Oral Daily   predniSONE  5 mg Oral Daily   umeclidinium bromide  1 puff Inhalation Daily   venlafaxine  XR  150 mg Oral QPM   Infusions:  PRN: acetaminophen **OR** acetaminophen, ipratropium-albuterol, ondansetron **OR** ondansetron (ZOFRAN) IV, senna-docusate  Assessment: Patient admitted 11/29 for HF exacerbation. HR on 12/2 up to 179 and EKG shows AF RVR. Pharmacy consulted to start Eliquis.   Patient's Scr is > 1.5 and given her weight of < 60 kg, she qualifies for dose adjustment to 2.5 mg BID.   Goal of Therapy:  Monitor platelets by anticoagulation protocol: Yes   Plan:  Apixaban 2.5 mg BID  Discontinue enoxaparin, received dose this AM. MD okay to start apixaban now.  Pharmacy will continue to follow and provide education at discharge for new start Eliquis    Cedric Fishman, PharmD, BCPS, BCCCP Clinical Pharmacist

## 2023-06-10 NOTE — Progress Notes (Signed)
Heart Failure Navigator Progress Note  Assessed for Heart & Vascular TOC clinic readiness.  Patient does not meet criteria.   No TOC - poor prognosis, plan f/u with [facility] per Dr. Ella Jubilee.  20-25% EF   Navigator will sign off at this time.   Rhae Hammock, BSN, Scientist, clinical (histocompatibility and immunogenetics) Only

## 2023-06-10 NOTE — Progress Notes (Signed)
Patient with persistent atrial fibrillation with rate 160 to 180 bpm with blood pressure 89/62  Plan to start amiodarone infusion and transfer to progressive care

## 2023-06-11 DIAGNOSIS — J9601 Acute respiratory failure with hypoxia: Secondary | ICD-10-CM | POA: Diagnosis not present

## 2023-06-11 DIAGNOSIS — I480 Paroxysmal atrial fibrillation: Secondary | ICD-10-CM

## 2023-06-11 DIAGNOSIS — Z7901 Long term (current) use of anticoagulants: Secondary | ICD-10-CM | POA: Diagnosis not present

## 2023-06-11 DIAGNOSIS — N179 Acute kidney failure, unspecified: Secondary | ICD-10-CM | POA: Diagnosis not present

## 2023-06-11 DIAGNOSIS — I350 Nonrheumatic aortic (valve) stenosis: Secondary | ICD-10-CM

## 2023-06-11 DIAGNOSIS — I4891 Unspecified atrial fibrillation: Secondary | ICD-10-CM | POA: Diagnosis not present

## 2023-06-11 DIAGNOSIS — D869 Sarcoidosis, unspecified: Secondary | ICD-10-CM | POA: Diagnosis not present

## 2023-06-11 DIAGNOSIS — I5023 Acute on chronic systolic (congestive) heart failure: Secondary | ICD-10-CM | POA: Diagnosis not present

## 2023-06-11 LAB — BASIC METABOLIC PANEL
Anion gap: 15 (ref 5–15)
BUN: 45 mg/dL — ABNORMAL HIGH (ref 8–23)
CO2: 20 mmol/L — ABNORMAL LOW (ref 22–32)
Calcium: 8.9 mg/dL (ref 8.9–10.3)
Chloride: 103 mmol/L (ref 98–111)
Creatinine, Ser: 2.68 mg/dL — ABNORMAL HIGH (ref 0.44–1.00)
GFR, Estimated: 18 mL/min — ABNORMAL LOW (ref >60)
Glucose, Bld: 100 mg/dL — ABNORMAL HIGH (ref 70–99)
Potassium: 3.9 mmol/L (ref 3.5–5.1)
Sodium: 138 mmol/L (ref 135–145)

## 2023-06-11 NOTE — Progress Notes (Signed)
Mobility Specialist Progress Note:   06/11/23 1052  Mobility  Activity Ambulated with assistance in hallway  Level of Assistance Contact guard assist, steadying assist  Assistive Device None  Distance Ambulated (ft) 350 ft  Activity Response Tolerated well  Mobility Referral Yes  $Mobility charge 1 Mobility  Mobility Specialist Start Time (ACUTE ONLY) 0945  Mobility Specialist Stop Time (ACUTE ONLY) 0957  Mobility Specialist Time Calculation (min) (ACUTE ONLY) 12 min   During Mobility: 106 HR , 95% SpO2 Post Mobility: 82 HR , 94% SpO2  Pt received in bed, agreeable to mobility. SB to stand. CG during ambulation. Pt displayed 1x LOB during ambulation when attempting to show wrist watch but quickly recovered with CG. Pt c/o slight fatigue afterwards otherwise asx throughout. VSS. Pt return to bed with call bell in reach and all needs met.   Alexis Murphy  Mobility Specialist Please contact via [employer] office at [phone]

## 2023-06-11 NOTE — Progress Notes (Signed)
Progress Note   Patient: Alexis Murphy ZOX:[id number] DOB: 24-Aug-1945 DOA: 06/07/2023     4 DOS: the patient was seen and examined on 06/11/2023   Brief hospital course: Mrs. Schmeer was admitted to the hospital with the working diagnosis of heart failure exacerbation.   77 yo female with the past medical history of sarcoidosis, CKD stage 3b, hypertension, hypothyroid and dyslipidemia who presented with dyspnea. Patient had a acute onset of severe dyspnea, associated with wheezing. Symptoms were refractive to inhaled albuterol. Because severe symptoms EMS was called. She was found with 02 saturation 80%, she was placed on Cpap and was transported to the ED. On her initial physical examination her blood pressure was 166/61, HR 106, RR 27 and 02 saturation 89%, lungs with expiratory wheezing, and distant breath sounds, heart with S1 and S2 present, regular with no murmurs or gallops, abdomen with no distention and trace lower extremity edema.   ABG 7.24/ 35/ 53/ 15/ 82%  Na 140 K 3,7 CL 112 bicarbonate 14, glucose 231 BUN 36  Cr 2,0  Anion gap 14  AST 42, ALT 25  BNP 967 High sensitive troponin 95 and 169  Lactic acid 3,5 and 1.3  Wbc 14.3 hgb 10,0 plt 579   Chest radiograph with hyperinflation, right rotation, bilateral hilar vascular congestion and dense interstitial infiltrates centrally, no pleural effusions.  EKG 100 bpm, left axis deviation, left bundle branch block, qtc 538, sinus rhythm with PVC and PAC, no significant ST segment or T wave changes.   Patient was placed on IV furosemide for diuresis.   12/01 patient with improvement in volume status.  12/02 patient developed atrial fibrillation with rapid ventricular response.  12/03 patient has converted to sinus rhythm, pending renal function to improve.   Assessment and Plan: Acute on chronic systolic CHF (congestive heart failure) (HCC) Echocardiogram with reduced LV systolic function with EF 20 to 25%, global hypokinesis  with akinetic apex. LV cavity with mild dilatation, mild LVH, RV systolic function is preserved, RVSP 16.8 mmHg, LA with severe dilatation, mild to moderate mitral regurgitation, aortic valve with moderate to severe regurgitation, mild aortic stenosis,   Documented urine output 600 cc,  Systolic blood pressure 140's mmHg.   Continue carvedilol.  If renal function stable will plan to add MRA, and RAS inhibition.  Hold on SGLT 2 inh due to worsening renal function.   Patient not candidate for invasive valvular intervention.   Troponin elevation likely due to heart failure exacerbation, considering new reduction in LV systolic function and akinetic apex, patient may need ischemic workup, in the differential diagnosis stress induced cardiomyopathy or sarcoid cardiomyopathy.    Acute hypoxemic respiratory failure due to acute cardiogenic pulmonary edema.  02 saturation today 96% on 2 L/min per   Check ambulatory oxymetry on room air.   Atrial fibrillation (HCC) Patient has been placed on amiodarone for rate and rhythm control.  She has converted to sinus rhythm  Plan to continue with amiodarone IV per protocol and transition to po. Possible discontinuation later on.  Continue anticoagulation with apixaban.   AKI (acute kidney injury) (HCC) CKD stage 3b. Hypokalemia   Renal function with serum cr at 2,68 with K at 3,9 and serum bicarbonate at 20 Na 138   Continue to hold on diuretics, including SGLT 2 inh.  Follow up renal function and electrolytes in am.   Sarcoidosis Multisystemic sarcoidosis.  Raynaud's disease/ arthritis.   Plan to resume prednisone 5 mg daily.  Continue with leflunomide.  COPD (chronic obstructive pulmonary disease) (HCC) No clinical signs of exacerbation. Plan to continue bronchodilator therapy. Hold on  high dose of systemic corticosteroids.  Oxymetry monitoring.   Essential hypertension Continue carvedilol, possible addition of RAS inhibition  when renal function more stable.   PAD (peripheral artery disease) (HCC) Continue with aspirin and statin therapy.   Hypothyroidism Continue with levothyroxine   Iron deficiency anemia Will plan for IV iron during this hospitalization.   Myeloproliferative neoplasm.  Essential thrombocytosis, continue with hydroxyurea.  Follow up cell count.         Subjective: Patient is feeling better with no chest pain, no dyspnea, no edema, no PND or orthopnea   Physical Exam: Vitals:   06/11/23 0843 06/11/23 0852 06/11/23 1235 06/11/23 1628  BP: (!) 145/68 (!) 145/68 (!) 108/51 (!) 142/62  Pulse: 76 79 72 79  Resp: 16 18 (!) 24 19  Temp: 97.9 F (36.6 C) (!) 97.3 F (36.3 C) 98.8 F (37.1 C) 98.8 F (37.1 C)  TempSrc: Oral Oral Oral Oral  SpO2: 92% 93% 94% 92%  Weight:      Height:       Neurology awake and alert ENT with mild pallor Cardiovascular with S1 and S2 present and regular with no gallops, rubs with positive systolic murmur at the base Respiratory with no rales or wheezing, scattered rhonchi and prolonged expiratory phase Abdomen with no distention  No lower extremity edema  Data Reviewed:    Family Communication: no family at the bedside   Disposition: Status is: Inpatient Remains inpatient appropriate because: pending renal function to improve.   Planned Discharge Destination: Home  Author: Coralie Keens, MD 06/11/2023 4:48 PM  For on call review [email or web address].

## 2023-06-11 NOTE — Care Management Important Message (Signed)
Important Message  Patient Details  Name: Alexis Murphy MRN: [id number] Date of Birth: [birth year]   Important Message Given:  Yes - Medicare IM     Dorena Bodo 06/11/2023, 3:44 PM

## 2023-06-11 NOTE — Plan of Care (Signed)

## 2023-06-11 NOTE — Progress Notes (Signed)
Rounding Note    Patient Name: Alexis Murphy Date of Encounter: 06/11/2023  [facility] Cardiologist: Chrystie Nose, MD   Subjective   States that she feels much better  Converted to NSR 06/10/2023 (while on amio drip) No events overnight.   Inpatient Medications    Scheduled Meds:  apixaban  2.5 mg Oral BID   aspirin EC  81 mg Oral Daily   atorvastatin  80 mg Oral QPM   carvedilol  12.5 mg Oral BID WC   empagliflozin  10 mg Oral Daily   feeding supplement  237 mL Oral BID BM   folic acid  1 mg Oral Daily   hydroxyurea  500 mg Oral Q M,W,F   leflunomide  20 mg Oral Daily   levothyroxine  50 mcg Oral Q24H   melatonin  5 mg Oral QHS   pantoprazole  40 mg Oral Daily   predniSONE  5 mg Oral Daily   umeclidinium bromide  1 puff Inhalation Daily   venlafaxine XR  150 mg Oral QPM   Continuous Infusions:  amiodarone 30 mg/hr (06/11/23 0411)   PRN Meds: acetaminophen **OR** acetaminophen, ipratropium-albuterol, ondansetron **OR** ondansetron (ZOFRAN) IV, senna-docusate   Vital Signs    Vitals:   06/11/23 0319 06/11/23 0805 06/11/23 0843 06/11/23 0852  BP: (!) 148/64  (!) 145/68 (!) 145/68  Pulse: 76 75 76 79  Resp: 20 18 16 18   Temp: (!) 97.5 F (36.4 C)  97.9 F (36.6 C) (!) 97.3 F (36.3 C)  TempSrc: Oral  Oral Oral  SpO2: 95% 93% 92% 93%  Weight:      Height:        Intake/Output Summary (Last 24 hours) at 06/11/2023 0917 Last data filed at 06/10/2023 1257 Gross per 24 hour  Intake 83.32 ml  Output --  Net 83.32 ml      06/09/2023    4:08 AM 06/08/2023    4:48 AM 06/07/2023    6:39 AM  Last 3 Weights  Weight (lbs) 118 lb 13.3 oz 121 lb 4 oz 121 lb 4.1 oz  Weight (kg) 53.9 kg 54.999 kg 55 kg      Telemetry    NSR (converted to SR 06/10/2023) - Personally Reviewed    ECG    06/10/2023: A-fib with RVR, heart rate 159 bpm, left bundle branch block, rare PVCs.  - Personally Reviewed  Physical Exam   GEN: No acute distress,  chronically ill, hemodynamically stable.   Neck: No JVD Cardiac: regular, positive S1-S2, 3/6 holosystolic murmur, no rubs or gallops Respiratory: barrel chest, decreased breath sounds bilateral. No wheezes rales or rhonchi's. GI: Soft, nontender, non-distended  MS: No edema; No deformity. Neuro:  Nonfocal  Psych: Normal affect   Labs    High Sensitivity Troponin:   Recent Labs  Lab 06/07/23 0630 06/07/23 0822 06/07/23 1225 06/07/23 1429  TROPONINIHS 95* 169* 294* 291*     Chemistry Recent Labs  Lab 06/07/23 0630 06/07/23 1225 06/08/23 0439 06/09/23 0545 06/10/23 0738 06/10/23 0942 06/11/23 0742  NA 140  --    < > 139 141 140 138  K 3.7  --    < > 3.8 3.4* 3.6 3.9  CL 112*  --    < > 110 108 107 103  CO2 14*  --    < > 20* 21* 18* 20*  GLUCOSE 231*  --    < > 93 82 99 100*  BUN 36*  --    < >  39* 41* 45* 45*  CREATININE 2.07*  --    < > 2.40* 2.25* 2.36* 2.68*  CALCIUM 8.5*  --    < > 8.9 8.8* 9.1 8.9  MG  --  2.4  --  2.1  --   --   --   PROT 6.2*  --   --   --   --   --   --   ALBUMIN 3.4*  --   --   --   --   --   --   AST 42*  --   --   --   --   --   --   ALT 25  --   --   --   --   --   --   ALKPHOS 76  --   --   --   --   --   --   BILITOT 0.4  --   --   --   --   --   --   GFRNONAA 24*  --    < > 20* 22* 21* 18*  ANIONGAP 14  --    < > 9 12 15 15    < > = values in this interval not displayed.    Lipids No results for input(s): "CHOL", "TRIG", "HDL", "LABVLDL", "LDLCALC", "CHOLHDL" in the last 168 hours.  Hematology Recent Labs  Lab 06/07/23 0625 06/07/23 0630 06/08/23 0439  WBC  --  14.3* 13.7*  RBC  --  3.39* 3.04*  HGB 9.9* 10.0* 9.0*  HCT 29.0* 33.0* 28.0*  MCV  --  97.3 92.1  MCH  --  29.5 29.6  MCHC  --  30.3 32.1  RDW  --  14.4 14.4  PLT  --  579* 501*   Thyroid  Recent Labs  Lab 06/07/23 1225  TSH 1.657    BNP Recent Labs  Lab 06/07/23 0630  BNP 967.4*    DDimer No results for input(s): "DDIMER" in the last 168 hours.    Radiology    No results found.  Cardiac Studies   Echo 06/07/2023 1. 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 Left ventricular ejection fraction, by estimation,  is 20 to 25%. The left ventricle has severely decreased function. The left  ventricle demonstrates global hypokinesis. The left ventricular internal  cavity size was mildly dilated.  There is mild left ventricular hypertrophy. Left ventricular diastolic  parameters are indeterminate.   2. Right ventricular systolic function is normal. The right ventricular  size is normal. There is normal pulmonary artery systolic pressure. The  estimated right ventricular systolic pressure is 16.8 mmHg.   3. Left atrial size was severely dilated.   4. The mitral valve is degenerative. Mild to moderate mitral valve  regurgitation.   5. The aortic valve was not well visualized. Aortic valve regurgitation  is moderate to severe. Mild aortic valve stenosis.   6. Not well visualized.   7. The inferior vena cava is normal in size with greater than 50%  respiratory variability, suggesting right atrial pressure of 3 mmHg.   Patient Profile     Alexis Murphy is a 77 y.o. female with a hx of pulmonary sarcoidosis, CKD stage IIIb, osteoarthritis, HTN, HLD, hypothyroidism, chronic thrombocytopenia who is being seen 06/07/2023 for the evaluation of new CHF at the request of Dr. Kirke Corin.   Assessment & Plan    1.Acute HFrEF Jan 2021 echo: LVEF 55-60%, indet diastolic, normal RV, mild AI 05/2023 echo: LVEF 20-25%, global  hypokinesis, Normal RV. Mild to mod MR, mod to severe AI, mild AS 05/2023 CTA: No acute aortic pathology related to AI BNP 967, CXR bilateral edema Net IO Since Admission: -2,116.68 mL [06/11/23 0917] Medical therapy limited by renal dysfunction.  Avoid ACE/ARB/ARNI/MRA. Borderline for SGLT2i, continue for now.  Coreg changed from 3.125 mg p.o. twice daily to 12.5 mg p.o. twice daily. Currently on Jardiance. Hydralazine and Imdur held for now to  provide renal perfusion and avoid hypotension (had intermittent episode due to Afib w/ RVR).  Poor cath candidate with GFR at 18. Mild trop elevation on admission likely related to type II ischemia.  Patient understands and agree that she will be treated medically for her cardiomyopathy as long as she is not have anginal chest pain and does not ACS presentation to presreve renal function. However, if she has CP she should seek medical attention.  Monitor for now.     2. AKI on CKD Appears baseline creatinine is around 1.5-1.6.  Cr 2.68 today  Recommend nephrology consult  Currently not on nephrotoxic agents.   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 Mild to mod MR, mod to severe AI, mild AS from review AVA 1.08, mean gradient 13, DI 0.42. Essentially low flow low gradient moderate AS.  CTA no acute aortic pathology Recommend outpatient workup    4. Paroxysmal Afib.  Onset: 06/10/2023 Placed on amiodarone drip by primary team and converted to SR on 06/10/2023  Rate control: Coreg  Rhythm control: Amiodarone gtt started 06/10/2023 (will complete the 24 drip for now) transition to po 06/12/2023 Thromboembolic prophylaxis: Eliquis 2.5 mg p.o. twice daily started 06/10/2023 (weight 54 kg, age 60, serum creatinine 2.4) No need for cardioversion - resume diet.  EKG ordered  Continue telemetry  TSH 1.65 as of 06/07/23 - known hx of hypothyroidism, currently on synthroid.  Given her pulmonary sarcoidosis, former smoker, and COPD would like to avoid long-term amiodarone. However, given her severely reduced LVEF she is at risk of Afib. Will continue amiodarone for now but recommended re-evaluation as outpatient (if no more Afib episode) could be d/c and monitor clinically.   5. Acute Hypoxemic respiratory failure Multifactorial: COPD, pulmonary sarcoidosis, new HFrEF, newly discovered Afib rvr (now SR) HFrEF management as discussed above. Atrial  fibrillation management as discussed above. Defer other causes of hypoxic respiratory failure workup and management to primary team.  6. PAD: Denies claudication.  Continue ASA and statin   7. HTN:   BP trend acceptable.  Continue Coreg and SGLT2i  Hold Hydralazine and Imdur for reasons mentioned above.   For questions or updates, please contact [facility] Please consult [email or web address] for contact info under   Plan of care care discussed w/ patient and RN.    Signed, Tessa Lerner, DO, [facility]  [facility]  [address] [place], [place] [address] Pager: [phone] Office: [phone] 9:17 AM 06/11/23

## 2023-06-11 NOTE — Discharge Instructions (Signed)

## 2023-06-12 DIAGNOSIS — N179 Acute kidney failure, unspecified: Secondary | ICD-10-CM | POA: Diagnosis not present

## 2023-06-12 DIAGNOSIS — I480 Paroxysmal atrial fibrillation: Secondary | ICD-10-CM | POA: Diagnosis not present

## 2023-06-12 DIAGNOSIS — Z7901 Long term (current) use of anticoagulants: Secondary | ICD-10-CM | POA: Diagnosis not present

## 2023-06-12 DIAGNOSIS — I5023 Acute on chronic systolic (congestive) heart failure: Secondary | ICD-10-CM | POA: Diagnosis not present

## 2023-06-12 LAB — BASIC METABOLIC PANEL
Anion gap: 10 (ref 5–15)
BUN: 42 mg/dL — ABNORMAL HIGH (ref 8–23)
CO2: 21 mmol/L — ABNORMAL LOW (ref 22–32)
Calcium: 8.5 mg/dL — ABNORMAL LOW (ref 8.9–10.3)
Chloride: 106 mmol/L (ref 98–111)
Creatinine, Ser: 2.65 mg/dL — ABNORMAL HIGH (ref 0.44–1.00)
GFR, Estimated: 18 mL/min — ABNORMAL LOW (ref >60)
Glucose, Bld: 92 mg/dL (ref 70–99)
Potassium: 3.2 mmol/L — ABNORMAL LOW (ref 3.5–5.1)
Sodium: 137 mmol/L (ref 135–145)

## 2023-06-12 LAB — CULTURE, BLOOD (SINGLE)
Culture: NO GROWTH
Culture: NO GROWTH

## 2023-06-12 MED ORDER — AMIODARONE HCL 200 MG PO TABS
200.0000 mg | ORAL_TABLET | Freq: Every day | ORAL | Status: DC
  Administered 2023-06-12 – 2023-06-13 (×2): 200 mg via ORAL
  Filled 2023-06-12 (×2): qty 1

## 2023-06-12 MED ORDER — EMPAGLIFLOZIN 10 MG PO TABS
10.0000 mg | ORAL_TABLET | Freq: Every day | ORAL | Status: DC
  Administered 2023-06-12 – 2023-06-13 (×2): 10 mg via ORAL
  Filled 2023-06-12 (×2): qty 1

## 2023-06-12 MED ORDER — POTASSIUM CHLORIDE CRYS ER 20 MEQ PO TBCR
40.0000 meq | EXTENDED_RELEASE_TABLET | Freq: Once | ORAL | Status: AC
  Administered 2023-06-12: 40 meq via ORAL
  Filled 2023-06-12: qty 2

## 2023-06-12 NOTE — Progress Notes (Signed)
Rounding Note    Patient Name: Alexis Murphy Date of Encounter: 06/12/2023  [facility] Cardiologist: Dr. Excell Seltzer (new)  Subjective   No events overnight. Son at bedside. Denies anginal chest pain or heart failure symptoms  Inpatient Medications    Scheduled Meds:  amiodarone  200 mg Oral Daily   apixaban  2.5 mg Oral BID   aspirin EC  81 mg Oral Daily   atorvastatin  80 mg Oral QPM   carvedilol  12.5 mg Oral BID WC   empagliflozin  10 mg Oral Daily   feeding supplement  237 mL Oral BID BM   folic acid  1 mg Oral Daily   hydroxyurea  500 mg Oral Q M,W,F   leflunomide  20 mg Oral Daily   levothyroxine  50 mcg Oral Q24H   melatonin  5 mg Oral QHS   pantoprazole  40 mg Oral Daily   predniSONE  5 mg Oral Daily   umeclidinium bromide  1 puff Inhalation Daily   venlafaxine XR  150 mg Oral QPM   Continuous Infusions:   PRN Meds: acetaminophen **OR** acetaminophen, ipratropium-albuterol, ondansetron **OR** ondansetron (ZOFRAN) IV, senna-docusate   Vital Signs    Vitals:   06/12/23 0003 06/12/23 0612 06/12/23 0730 06/12/23 1138  BP: (!) 158/58  (!) 128/51 (!) 116/51  Pulse: 72  70 66  Resp: 18  20 16   Temp: 98.6 F (37 C)  97.9 F (36.6 C) 98.8 F (37.1 C)  TempSrc: Oral  Oral Oral  SpO2: 91%  93% 91%  Weight:  52.5 kg    Height:        Intake/Output Summary (Last 24 hours) at 06/12/2023 1437 Last data filed at 06/12/2023 1402 Gross per 24 hour  Intake 1159.01 ml  Output --  Net 1159.01 ml      06/12/2023    6:12 AM 06/09/2023    4:08 AM 06/08/2023    4:48 AM  Last 3 Weights  Weight (lbs) 115 lb 12.8 oz 118 lb 13.3 oz 121 lb 4 oz  Weight (kg) 52.527 kg 53.9 kg 54.999 kg      Telemetry    Remains in sinus rhythm. - Personally Reviewed  ECG    06/10/2023: A-fib with RVR, heart rate 159 bpm, left bundle branch block, rare PVCs.  - Personally Reviewed  Physical Exam   GEN: No acute distress, chronically ill, hemodynamically stable.    Neck: No JVD Cardiac: regular, positive S1-S2, 3/6 holosystolic murmur, no rubs or gallops Respiratory: barrel chest, decreased breath sounds bilateral. + wheezes no rales or rhonchi's. GI: Soft, nontender, non-distended  MS: No edema; No deformity. Neuro:  Nonfocal  Psych: Normal affect   Labs    High Sensitivity Troponin:   Recent Labs  Lab 06/07/23 0630 06/07/23 0822 06/07/23 1225 06/07/23 1429  TROPONINIHS 95* 169* 294* 291*     Chemistry Recent Labs  Lab 06/07/23 0630 06/07/23 1225 06/08/23 0439 06/09/23 0545 06/10/23 0738 06/10/23 0942 06/11/23 0742 06/12/23 0329  NA 140  --    < > 139   < > 140 138 137  K 3.7  --    < > 3.8   < > 3.6 3.9 3.2*  CL 112*  --    < > 110   < > 107 103 106  CO2 14*  --    < > 20*   < > 18* 20* 21*  GLUCOSE 231*  --    < > 93   < >  99 100* 92  BUN 36*  --    < > 39*   < > 45* 45* 42*  CREATININE 2.07*  --    < > 2.40*   < > 2.36* 2.68* 2.65*  CALCIUM 8.5*  --    < > 8.9   < > 9.1 8.9 8.5*  MG  --  2.4  --  2.1  --   --   --   --   PROT 6.2*  --   --   --   --   --   --   --   ALBUMIN 3.4*  --   --   --   --   --   --   --   AST 42*  --   --   --   --   --   --   --   ALT 25  --   --   --   --   --   --   --   ALKPHOS 76  --   --   --   --   --   --   --   BILITOT 0.4  --   --   --   --   --   --   --   GFRNONAA 24*  --    < > 20*   < > 21* 18* 18*  ANIONGAP 14  --    < > 9   < > 15 15 10    < > = values in this interval not displayed.    Lipids No results for input(s): "CHOL", "TRIG", "HDL", "LABVLDL", "LDLCALC", "CHOLHDL" in the last 168 hours.  Hematology Recent Labs  Lab 06/07/23 0625 06/07/23 0630 06/08/23 0439  WBC  --  14.3* 13.7*  RBC  --  3.39* 3.04*  HGB 9.9* 10.0* 9.0*  HCT 29.0* 33.0* 28.0*  MCV  --  97.3 92.1  MCH  --  29.5 29.6  MCHC  --  30.3 32.1  RDW  --  14.4 14.4  PLT  --  579* 501*   Thyroid  Recent Labs  Lab 06/07/23 1225  TSH 1.657    BNP Recent Labs  Lab 06/07/23 0630  BNP 967.4*     DDimer No results for input(s): "DDIMER" in the last 168 hours.   Radiology    No results found.  Cardiac Studies   Echo 06/07/2023 1. 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 Left ventricular ejection fraction, by estimation,  is 20 to 25%. The left ventricle has severely decreased function. The left  ventricle demonstrates global hypokinesis. The left ventricular internal  cavity size was mildly dilated.  There is mild left ventricular hypertrophy. Left ventricular diastolic  parameters are indeterminate.   2. Right ventricular systolic function is normal. The right ventricular  size is normal. There is normal pulmonary artery systolic pressure. The  estimated right ventricular systolic pressure is 16.8 mmHg.   3. Left atrial size was severely dilated.   4. The mitral valve is degenerative. Mild to moderate mitral valve  regurgitation.   5. The aortic valve was not well visualized. Aortic valve regurgitation  is moderate to severe. Mild aortic valve stenosis.   6. Not well visualized.   7. The inferior vena cava is normal in size with greater than 50%  respiratory variability, suggesting right atrial pressure of 3 mmHg.   Patient Profile     Alexis Murphy is a 77 y.o. female with a hx of  pulmonary sarcoidosis, CKD stage IIIb, osteoarthritis, HTN, HLD, hypothyroidism, chronic thrombocytopenia who is being seen 06/07/2023 for the evaluation of new CHF at the request of Dr. Kirke Corin.   Assessment & Plan    1.Acute HFrEF Jan 2021 echo: LVEF 55-60%, indet diastolic, normal RV, mild AI 05/2023 echo: LVEF 20-25%, global hypokinesis, Normal RV. Mild to mod MR, mod to severe AI, mild AS 05/2023 CTA: No acute aortic pathology related to AI BNP 967, CXR bilateral edema Net IO Since Admission: -717.67 mL [06/12/23 1437] Medical therapy limited by renal dysfunction.  Avoid ACE/ARB/ARNI/MRA. Borderline for SGLT2i, continue for now.  Continue cardiac nadolol and Jardiance for now.   Hydralazine and Imdur  held for now to provide renal perfusion and avoid hypotension (had intermittent episode due to Afib w/ RVR).  Not an ideal candidate for heart catheterization but her current renal function. Mild trop elevation on admission likely related to type II ischemia.  Patient understands and agree that she will be treated medically for her cardiomyopathy as long as she is not have anginal chest pain and does not ACS presentation to presreve renal function. However, if she has CP she should seek medical attention.  Monitor for now.     2. AKI on CKD Appears baseline creatinine is around 1.5-1.6.  Cr 2.65 today, likely starting to plateau Currently not on nephrotoxic agents. 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 Mild to mod MR, mod to severe AI, mild AS from review AVA 1.08, mean gradient 13, DI 0.42. Essentially low flow low gradient moderate AS.  CTA no acute aortic pathology Recommend outpatient workup    4. Paroxysmal Afib.  Onset: 06/10/2023 Placed on amiodarone drip by primary team and converted to SR on 06/10/2023  Will discontinue amiodarone drip today 12//2024 Rate control: Coreg  Rhythm control: Transition to oral amiodarone 200 mg p.o. daily for the next 30 days and stop and monitor clinically.  Given her pulmonary sarcoidosis, former smoker, and COPD would like to avoid long-term amiodarone. However, given her severely reduced LVEF she is at risk of Afib. Thromboembolic prophylaxis: Eliquis 2.5 mg p.o. twice daily started 06/10/2023 (weight <60 kg, age 72, serum creatinine 2.6) EKG change from yesterday illustrates sinus rhythm with PVC and left bundle branch block. Telemetry independently reviewed-overall sinus rhythm TSH 1.65 as of 06/07/23 - known hx of hypothyroidism, currently on synthroid.   5. Acute Hypoxemic respiratory failure Improved  Multifactorial: COPD, pulmonary sarcoidosis, new HFrEF, newly  discovered Afib rvr (now SR) HFrEF management as discussed above. Atrial fibrillation management as discussed above. Defer other causes of hypoxic respiratory failure workup and management to primary team.  6. PAD: Denies claudication.  Continue ASA and statin   7. HTN:   BP trend acceptable.  Continue Coreg and SGLT2i  Hold Hydralazine and Imdur for reasons mentioned above.    For questions or updates, please contact [facility] Please consult [email or web address] for contact info under   Plan of care care discussed w/ patient and RN.    Signed, Tessa Lerner, DO, [facility]  [facility]  [address] [place], [place] [address] Pager: [phone] Office: [phone] 2:37 PM 06/12/23

## 2023-06-12 NOTE — Progress Notes (Signed)
Mobility Specialist Progress Note:   06/12/23 1103  Mobility  Activity Ambulated with assistance in hallway  Level of Assistance  (MinG)  Assistive Device None  Distance Ambulated (ft) 375 ft  Activity Response Tolerated well  Mobility Referral Yes  $Mobility charge 1 Mobility  Mobility Specialist Start Time (ACUTE ONLY) 0950  Mobility Specialist Stop Time (ACUTE ONLY) 1002  Mobility Specialist Time Calculation (min) (ACUTE ONLY) 12 min   Pre Mobility: 92 HR  During Mobility: 112 HR  Post Mobility: 63 HR   Pt received in bed, agreeable to mobility. C/o L knee discomfort during ambulation, otherwise asx throughout. Pt returned to bed with call bell in reach and all needs met.   Leory Plowman  Mobility Specialist Please contact via [employer] office at [phone]

## 2023-06-12 NOTE — Plan of Care (Signed)

## 2023-06-12 NOTE — Progress Notes (Signed)
PROGRESS NOTE    [place]  OZD:[id number] DOB: [birth year] DOA: 06/07/2023 PCP: Daisy Floro, MD  77/F with sarcoidosis, CKD 3B, hypertension, hypothyroidism presented to the ED with dyspnea acute onset.  She was noted to be hypoxic initially, hypertensive. -Abs noted creatinine of 2.0, BUN 36, anion gap 14, BNP 967, troponin 95, 169, hemoglobin 10.0 -Chest x-ray noted hyperinflation pulmonary vascular congestion dense interstitial infiltrates  -Admitted, started on diuretics   12/2 patient developed atrial fibrillation with rapid ventricular response.  12/3 patient has converted to sinus rhythm  Subjective: -Feels better overall, breathing back to baseline  Assessment and Plan:  Acute systolic CHF, new onset -Echo 25/95 noted EF 20-25% global hypokinesis, normal RV, moderate to severe AVR previously EF was normal in 07/2019 -Diuresed with IV Lasix, volume status has improved -GDMT limited by CKD -Continue Jardiance, Coreg -Hydralazine and Imdur held to provide renal perfusion, avoid hypotension -Add daily vs PRN Lasix at discharge  Moderate to severe aortic regurgitation -not candidate for invasive valvular intervention.   Troponin elevation - likely due to demand ischemia from CHF  Acute hypoxemic respiratory failure due to acute cardiogenic pulmonary edema.  -Resolved, weaned off O2  Atrial fibrillation (HCC) -New onset, on amiodarone, converted to sinus rhythm on 12/2  -Continue Eliquis  -Goal to avoid long-term amiodarone considering pulmonary sarcoidosis, COPD etc.  AKI (acute kidney injury) (HCC) CKD stage 3b. Hypokalemia  -Baseline creatinine 1.6-2, now stable in the 2.6 range, likely cardiorenal  Sarcoidosis Multisystemic sarcoidosis.  Raynaud's disease/ arthritis.   -Continue home dose of prednisone 5 mg daily.  And leflunomide  COPD (chronic obstructive pulmonary disease) (HCC) No clinical signs of exacerbation. Plan to continue bronchodilator  therapy.Marland Kitchen   PAD (peripheral artery disease) (HCC) Continue with aspirin and statin therapy.   Hypothyroidism Continue with levothyroxine  Iron deficiency anemia Will plan for IV iron during this hospitalization.   Myeloproliferative neoplasm.  Essential thrombocytosis, continue with hydroxyurea.  Follow up cell count.   DVT prophylaxis: Apixaban Code Status: Full code Family Communication: None present Disposition Plan: Tomorrow if stable  Consultants:    Procedures:   Antimicrobials:    Objective: Vitals:   06/12/23 0003 06/12/23 0612 06/12/23 0730 06/12/23 1138  BP: (!) 158/58  (!) 128/51 (!) 116/51  Pulse: 72  70 66  Resp: 18  20 16   Temp: 98.6 F (37 C)  97.9 F (36.6 C) 98.8 F (37.1 C)  TempSrc: Oral  Oral Oral  SpO2: 91%  93% 91%  Weight:  52.5 kg    Height:        Intake/Output Summary (Last 24 hours) at 06/12/2023 1257 Last data filed at 06/12/2023 6387 Gross per 24 hour  Intake 679.01 ml  Output --  Net 679.01 ml   Filed Weights   06/08/23 0448 06/09/23 0408 06/12/23 0612  Weight: 55 kg 53.9 kg 52.5 kg    Examination:  General exam: Chronically ill female laying in bed, AAO x 3 Respiratory system: Clear to auscultation Cardiovascular system: S1 & S2 heard, RRR.  Systolic murmur Abd: nondistended, soft and nontender.Normal bowel sounds heard. Extremities: no edema Skin: No rashes Psychiatry:  Mood & affect appropriate.     Data Reviewed:   CBC: Recent Labs  Lab 06/07/23 0625 06/07/23 0630 06/08/23 0439  WBC  --  14.3* 13.7*  NEUTROABS  --  10.6*  --   HGB 9.9* 10.0* 9.0*  HCT 29.0* 33.0* 28.0*  MCV  --  97.3 92.1  PLT  --  579* 501*   Basic Metabolic Panel: Recent Labs  Lab 06/07/23 1225 06/08/23 0439 06/09/23 0545 06/10/23 0738 06/10/23 0942 06/11/23 0742 06/12/23 0329  NA  --    < > 139 141 140 138 137  K  --    < > 3.8 3.4* 3.6 3.9 3.2*  CL  --    < > 110 108 107 103 106  CO2  --    < > 20* 21* 18* 20* 21*   GLUCOSE  --    < > 93 82 99 100* 92  BUN  --    < > 39* 41* 45* 45* 42*  CREATININE  --    < > 2.40* 2.25* 2.36* 2.68* 2.65*  CALCIUM  --    < > 8.9 8.8* 9.1 8.9 8.5*  MG 2.4  --  2.1  --   --   --   --   PHOS 3.4  --   --   --   --   --   --    < > = values in this interval not displayed.   GFR: Estimated Creatinine Clearance: 13.4 mL/min (A) (by C-G formula based on SCr of 2.65 mg/dL (H)). Liver Function Tests: Recent Labs  Lab 06/07/23 0630  AST 42*  ALT 25  ALKPHOS 76  BILITOT 0.4  PROT 6.2*  ALBUMIN 3.4*   No results for input(s): "LIPASE", "AMYLASE" in the last 168 hours. No results for input(s): "AMMONIA" in the last 168 hours. Coagulation Profile: No results for input(s): "INR", "PROTIME" in the last 168 hours. Cardiac Enzymes: No results for input(s): "CKTOTAL", "CKMB", "CKMBINDEX", "TROPONINI" in the last 168 hours. BNP (last 3 results) No results for input(s): "PROBNP" in the last 8760 hours. HbA1C: No results for input(s): "HGBA1C" in the last 72 hours. CBG: No results for input(s): "GLUCAP" in the last 168 hours. Lipid Profile: No results for input(s): "CHOL", "HDL", "LDLCALC", "TRIG", "CHOLHDL", "LDLDIRECT" in the last 72 hours. Thyroid Function Tests: No results for input(s): "TSH", "T4TOTAL", "FREET4", "T3FREE", "THYROIDAB" in the last 72 hours. Anemia Panel: No results for input(s): "VITAMINB12", "FOLATE", "FERRITIN", "TIBC", "IRON", "RETICCTPCT" in the last 72 hours. Urine analysis:    Component Value Date/Time   COLORURINE YELLOW 07/26/2019 2027   APPEARANCEUR CLEAR 07/26/2019 2027   LABSPEC 1.005 07/26/2019 2027   PHURINE 7.0 07/26/2019 2027   GLUCOSEU NEGATIVE 07/26/2019 2027   HGBUR MODERATE (A) 07/26/2019 2027   BILIRUBINUR NEGATIVE 07/26/2019 2027   KETONESUR NEGATIVE 07/26/2019 2027   PROTEINUR 30 (A) 07/26/2019 2027   NITRITE NEGATIVE 07/26/2019 2027   LEUKOCYTESUR TRACE (A) 07/26/2019 2027   Sepsis  Labs: @LABRCNTIP (procalcitonin:4,lacticidven:4)  ) Recent Results (from the past 240 hour(s))  Culture, blood (single) w Reflex to ID Panel     Status: None   Collection Time: 06/07/23  6:12 AM   Specimen: BLOOD  Result Value Ref Range Status   Specimen Description BLOOD BLOOD RIGHT HAND  Final   Special Requests   Final    BOTTLES DRAWN AEROBIC AND ANAEROBIC Blood Culture results may not be optimal due to an inadequate volume of blood received in culture bottles   Culture   Final    NO GROWTH 5 DAYS Performed at [facility], [address] [place], [place] [address]    Report Status 06/12/2023 FINAL  Final  Resp panel by RT-PCR (RSV, Flu A&B, Covid) Anterior Nasal Swab     Status: None   Collection Time: 06/07/23  6:12 AM   Specimen: Anterior Nasal Swab  Result Value Ref Range Status   SARS Coronavirus 2 by RT PCR NEGATIVE NEGATIVE Final   Influenza A by PCR NEGATIVE NEGATIVE Final   Influenza B by PCR NEGATIVE NEGATIVE Final    Comment: (NOTE) The Xpert Xpress SARS-CoV-2/FLU/RSV plus assay is intended as an aid in the diagnosis of influenza from Nasopharyngeal swab specimens and should not be used as a sole basis for treatment. Nasal washings and aspirates are unacceptable for Xpert Xpress SARS-CoV-2/FLU/RSV testing.  Fact Sheet for Patients: BloggerCourse.com  Fact Sheet for Healthcare Providers: SeriousBroker.it  This test is not yet approved or cleared by the [place] FDA and has been authorized for detection and/or diagnosis of SARS-CoV-2 by FDA under an Emergency Use Authorization (EUA). This EUA will remain in effect (meaning this test can be used) for the duration of the COVID-19 declaration under Section 564(b)(1) of the Act, 21 U.S.C. section 360bbb-3(b)(1), unless the authorization is terminated or revoked.     Resp Syncytial Virus by PCR NEGATIVE NEGATIVE Final    Comment: (NOTE) Fact Sheet for  Patients: BloggerCourse.com  Fact Sheet for Healthcare Providers: SeriousBroker.it  This test is not yet approved or cleared by the [place] FDA and has been authorized for detection and/or diagnosis of SARS-CoV-2 by FDA under an Emergency Use Authorization (EUA). This EUA will remain in effect (meaning this test can be used) for the duration of the COVID-19 declaration under Section 564(b)(1) of the Act, 21 U.S.C. section 360bbb-3(b)(1), unless the authorization is terminated or revoked.  Performed at [facility], [address], [place], [place] [address]   Culture, blood (single)     Status: None   Collection Time: 06/07/23  7:01 AM   Specimen: BLOOD  Result Value Ref Range Status   Specimen Description BLOOD BLOOD LEFT HAND  Final   Special Requests   Final    AEROBIC BOTTLE ONLY Blood Culture results may not be optimal due to an inadequate volume of blood received in culture bottles   Culture   Final    NO GROWTH 5 DAYS Performed at [facility], [address], [place], [place] [address]    Report Status 06/12/2023 FINAL  Final     Radiology Studies: No results found.   Scheduled Meds:  apixaban  2.5 mg Oral BID   aspirin EC  81 mg Oral Daily   atorvastatin  80 mg Oral QPM   carvedilol  12.5 mg Oral BID WC   feeding supplement  237 mL Oral BID BM   folic acid  1 mg Oral Daily   hydroxyurea  500 mg Oral Q M,W,F   leflunomide  20 mg Oral Daily   levothyroxine  50 mcg Oral Q24H   melatonin  5 mg Oral QHS   pantoprazole  40 mg Oral Daily   predniSONE  5 mg Oral Daily   umeclidinium bromide  1 puff Inhalation Daily   venlafaxine XR  150 mg Oral QPM   Continuous Infusions:  amiodarone 30 mg/hr (06/12/23 0213)     LOS: 5 days    Time spent:    Zannie Cove, MD [facility]   06/12/2023, 12:57 PM

## 2023-06-13 DIAGNOSIS — I5023 Acute on chronic systolic (congestive) heart failure: Secondary | ICD-10-CM | POA: Diagnosis not present

## 2023-06-13 DIAGNOSIS — Z7901 Long term (current) use of anticoagulants: Secondary | ICD-10-CM | POA: Diagnosis not present

## 2023-06-13 DIAGNOSIS — I480 Paroxysmal atrial fibrillation: Secondary | ICD-10-CM | POA: Diagnosis not present

## 2023-06-13 DIAGNOSIS — N179 Acute kidney failure, unspecified: Secondary | ICD-10-CM | POA: Diagnosis not present

## 2023-06-13 LAB — BASIC METABOLIC PANEL
Anion gap: 13 (ref 5–15)
BUN: 38 mg/dL — ABNORMAL HIGH (ref 8–23)
CO2: 19 mmol/L — ABNORMAL LOW (ref 22–32)
Calcium: 8.6 mg/dL — ABNORMAL LOW (ref 8.9–10.3)
Chloride: 106 mmol/L (ref 98–111)
Creatinine, Ser: 2.44 mg/dL — ABNORMAL HIGH (ref 0.44–1.00)
GFR, Estimated: 20 mL/min — ABNORMAL LOW (ref >60)
Glucose, Bld: 94 mg/dL (ref 70–99)
Potassium: 3.5 mmol/L (ref 3.5–5.1)
Sodium: 138 mmol/L (ref 135–145)

## 2023-06-13 MED ORDER — APIXABAN 2.5 MG PO TABS: 2.5000 mg | ORAL_TABLET | Freq: Two times a day (BID) | ORAL | 0 refills | Status: DC

## 2023-06-13 MED ORDER — CARVEDILOL 12.5 MG PO TABS: 12.5000 mg | ORAL_TABLET | Freq: Two times a day (BID) | ORAL | 0 refills | Status: DC

## 2023-06-13 MED ORDER — EMPAGLIFLOZIN 10 MG PO TABS: 10.0000 mg | ORAL_TABLET | Freq: Every day | ORAL | 0 refills | Status: DC

## 2023-06-13 MED ORDER — FUROSEMIDE 40 MG PO TABS: 40.0000 mg | ORAL_TABLET | Freq: Every day | ORAL | 0 refills | Status: DC

## 2023-06-13 MED ORDER — AMIODARONE HCL 200 MG PO TABS: 200.0000 mg | ORAL_TABLET | Freq: Every day | ORAL | 0 refills | Status: DC

## 2023-06-13 NOTE — Progress Notes (Signed)
Rounding Note    Patient Name: [place] Murphy Date of Encounter: 06/13/2023  [facility] Cardiologist: Dr. Excell Seltzer (new)  Subjective   No events overnight. Resting comfortably. Patient requesting to be discharged.  Inpatient Medications    Scheduled Meds:  amiodarone  200 mg Oral Daily   apixaban  2.5 mg Oral BID   aspirin EC  81 mg Oral Daily   atorvastatin  80 mg Oral QPM   carvedilol  12.5 mg Oral BID WC   empagliflozin  10 mg Oral Daily   feeding supplement  237 mL Oral BID BM   folic acid  1 mg Oral Daily   hydroxyurea  500 mg Oral Q M,W,F   leflunomide  20 mg Oral Daily   levothyroxine  50 mcg Oral Q24H   melatonin  5 mg Oral QHS   pantoprazole  40 mg Oral Daily   predniSONE  5 mg Oral Daily   umeclidinium bromide  1 puff Inhalation Daily   venlafaxine XR  150 mg Oral QPM   Continuous Infusions:   PRN Meds: acetaminophen **OR** acetaminophen, ipratropium-albuterol, ondansetron **OR** ondansetron (ZOFRAN) IV, senna-docusate   Vital Signs    Vitals:   06/13/23 0341 06/13/23 0522 06/13/23 0736 06/13/23 1135  BP: (!) 137/59  105/65 123/64  Pulse: 74  77 69  Resp: 19  18 16   Temp: 98.6 F (37 C)  97.7 F (36.5 C) 97.6 F (36.4 C)  TempSrc: Oral  Oral Oral  SpO2: 93%  97% 94%  Weight:  51.9 kg    Height:        Intake/Output Summary (Last 24 hours) at 06/13/2023 1221 Last data filed at 06/13/2023 1153 Gross per 24 hour  Intake 720 ml  Output --  Net 720 ml      06/13/2023    5:22 AM 06/12/2023    6:12 AM 06/09/2023    4:08 AM  Last 3 Weights  Weight (lbs) 114 lb 8 oz 115 lb 12.8 oz 118 lb 13.3 oz  Weight (kg) 51.937 kg 52.527 kg 53.9 kg      Telemetry    Sinus rhythm without ectopy- Personally Reviewed  ECG    06/10/2023: A-fib with RVR, heart rate 159 bpm, left bundle branch block, rare PVCs.  - Personally Reviewed 06/11/2023: Sinus rhythm, PVC, left axis, left bundle branch block-personally reviewed  Physical Exam   GEN: No  acute distress, chronically ill, hemodynamically stable.   Neck: No JVD Cardiac: regular, positive S1-S2, 3/6 holosystolic murmur, no rubs or gallops Respiratory: barrel chest, decreased breath sounds bilateral. No wheezes no rales or rhonchi's. GI: Soft, nontender, non-distended  MS: No edema; No deformity. Neuro:  Nonfocal  Psych: Normal affect   Labs    High Sensitivity Troponin:   Recent Labs  Lab 06/07/23 0630 06/07/23 0822 06/07/23 1225 06/07/23 1429  TROPONINIHS 95* 169* 294* 291*     Chemistry Recent Labs  Lab 06/07/23 0630 06/07/23 1225 06/08/23 0439 06/09/23 0545 06/10/23 0738 06/11/23 0742 06/12/23 0329 06/13/23 0254  NA 140  --    < > 139   < > 138 137 138  K 3.7  --    < > 3.8   < > 3.9 3.2* 3.5  CL 112*  --    < > 110   < > 103 106 106  CO2 14*  --    < > 20*   < > 20* 21* 19*  GLUCOSE 231*  --    < >  93   < > 100* 92 94  BUN 36*  --    < > 39*   < > 45* 42* 38*  CREATININE 2.07*  --    < > 2.40*   < > 2.68* 2.65* 2.44*  CALCIUM 8.5*  --    < > 8.9   < > 8.9 8.5* 8.6*  MG  --  2.4  --  2.1  --   --   --   --   PROT 6.2*  --   --   --   --   --   --   --   ALBUMIN 3.4*  --   --   --   --   --   --   --   AST 42*  --   --   --   --   --   --   --   ALT 25  --   --   --   --   --   --   --   ALKPHOS 76  --   --   --   --   --   --   --   BILITOT 0.4  --   --   --   --   --   --   --   GFRNONAA 24*  --    < > 20*   < > 18* 18* 20*  ANIONGAP 14  --    < > 9   < > 15 10 13    < > = values in this interval not displayed.    Lipids No results for input(s): "CHOL", "TRIG", "HDL", "LABVLDL", "LDLCALC", "CHOLHDL" in the last 168 hours.  Hematology Recent Labs  Lab 06/07/23 0625 06/07/23 0630 06/08/23 0439  WBC  --  14.3* 13.7*  RBC  --  3.39* 3.04*  HGB 9.9* 10.0* 9.0*  HCT 29.0* 33.0* 28.0*  MCV  --  97.3 92.1  MCH  --  29.5 29.6  MCHC  --  30.3 32.1  RDW  --  14.4 14.4  PLT  --  579* 501*   Thyroid  Recent Labs  Lab 06/07/23 1225  TSH 1.657     BNP Recent Labs  Lab 06/07/23 0630  BNP 967.4*    DDimer No results for input(s): "DDIMER" in the last 168 hours.   Radiology    No results found.  Cardiac Studies   Echo 06/07/2023 1. 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 Left ventricular ejection fraction, by estimation,  is 20 to 25%. The left ventricle has severely decreased function. The left  ventricle demonstrates global hypokinesis. The left ventricular internal  cavity size was mildly dilated.  There is mild left ventricular hypertrophy. Left ventricular diastolic  parameters are indeterminate.   2. Right ventricular systolic function is normal. The right ventricular  size is normal. There is normal pulmonary artery systolic pressure. The  estimated right ventricular systolic pressure is 16.8 mmHg.   3. Left atrial size was severely dilated.   4. The mitral valve is degenerative. Mild to moderate mitral valve  regurgitation.   5. The aortic valve was not well visualized. Aortic valve regurgitation  is moderate to severe. Mild aortic valve stenosis.   6. Not well visualized.   7. The inferior vena cava is normal in size with greater than 50%  respiratory variability, suggesting right atrial pressure of 3 mmHg.   Patient Profile     [place] Murphy is a 77 y.o.  female with a hx of pulmonary sarcoidosis, CKD stage IIIb, osteoarthritis, HTN, HLD, hypothyroidism, chronic thrombocytopenia who is being seen 06/07/2023 for the evaluation of new CHF at the request of Dr. Kirke Corin.   Assessment & Plan    1.Acute HFrEF Jan 2021 echo: LVEF 55-60%, indet diastolic, normal RV, mild AI 05/2023 echo: LVEF 20-25%, global hypokinesis, Normal RV. Mild to mod MR, mod to severe AI, mild AS 05/2023 CTA: No acute aortic pathology related to AI BNP 967, CXR bilateral edema Net IO Since Admission: -237.67 mL [06/13/23 1221] - may not be accurate. Euvolemic on exam Medical therapy limited by renal dysfunction.  Cr slowly improving  Avoid  ACE/ARB/ARNI/MRA. Can be considered as outpatient if and when renal function improved.  Continue cardiac carvedilol and Jardiance for now.   Hydralazine and Imdur held for now to provide renal perfusion and avoid hypotension (SBP ranges 105-123) over the last 24hr.  Not an ideal candidate for heart catheterization but her current renal function. Mild trop elevation on admission likely related to type II ischemia.  Patient understands and agree that she will be treated medically for her cardiomyopathy as long as she is not have anginal chest pain and does not ACS presentation to presreve renal function. However, if she has CP she should seek medical attention.  Monitor for now.     2. AKI on CKD Appears baseline creatinine is around 1.5-1.6.  Cr 2.4 today, likely starting to plateau Currently not on nephrotoxic agents. 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 Mild to mod MR, mod to severe AI, mild AS from review AVA 1.08, mean gradient 13, DI 0.42. Essentially low flow low gradient moderate AS.  CTA no acute aortic pathology Recommend outpatient workup    4. Paroxysmal Afib.  Onset: 06/10/2023 Placed on amiodarone drip by primary team and converted to SR on 06/10/2023  Discontinued amiodarone drip and transition to oral therapy for 30 days.  Thereafter would recommend monitoring her clinically. Given her pulmonary sarcoidosis, former smoker, and COPD would like to avoid long-term amiodarone. However, given her severely reduced LVEF she is at risk of Afib. Continue carvedilol. Continue Eliquis for A-fib dosing. Telemetry independently reviewed- sinus rhythm TSH 1.65 as of 06/07/23 - known hx of hypothyroidism, currently on synthroid.   5. Acute Hypoxemic respiratory failure Improved  Multifactorial: COPD, pulmonary sarcoidosis, new HFrEF, newly discovered Afib rvr (now SR) HFrEF management as discussed  above. Atrial fibrillation management as discussed above. Defer other causes of hypoxic respiratory failure workup and management to primary team.  6. PAD: Denies claudication.  Continue ASA and statin   7. HTN: Well-controlled.  Medications as noted above  From cardiovascular standpoint no additional testing warranted at this time. Atrial fibrillation is well-controlled on current medical therapy and she remains in sinus rhythm. Given her renal function not a candidate for uptitration of GDMT at this time. Cardiology will sign off for now.  Please reach out if any questions or concerns arise. Patient was initially seen by Dr. Excell Seltzer on 06/07/2023 will try to arrange follow-up with either him or his APP postdischarge.  Patient is agreeable with the plan of care.  And recommendations conveyed to attending physician.  For questions or updates, please contact [facility] Please consult [email or web address] for contact info under   Plan of care care discussed w/ patient and RN.    Signed, Delilah Shan, [facility]   [facility]  [phone]  [address] [place], [place] [address] Pager: [phone] Office: [phone] 12:21 PM 06/13/23

## 2023-06-13 NOTE — Progress Notes (Signed)
   06/13/23 1415  TOC Brief Assessment  Insurance and Status Reviewed  Patient has primary care physician Yes  Home environment has been reviewed home  Prior level of function: self  Prior/Current Home Services No current home services  Social Determinants of Health Reivew SDOH reviewed no interventions necessary  Readmission risk has been reviewed Yes  Transition of care needs no transition of care needs at this time     Pt stable for transition home today, has son that can assist. No HH or DME needs noted. Family to transport home

## 2023-06-13 NOTE — Discharge Summary (Signed)
Physician Discharge Summary  Alexis Murphy DGL:[id number] DOB: January 08, 1946 DOA: 06/07/2023  PCP: Alexis Floro, MD  Admit date: 06/07/2023 Discharge date: 06/13/2023  Time spent: 45 minutes  Recommendations for Outpatient Follow-up:  [facility] in 2 weeks, please check BMP at follow-up Attempted discontinue amiodarone in 1 month   Discharge Diagnoses:  Active Problems:   Acute systolic CHF (congestive heart failure) (HCC)   Atrial fibrillation (HCC)   AKI (acute kidney injury) (HCC)   Sarcoidosis   COPD (chronic obstructive pulmonary disease) (HCC)   Essential hypertension   PAD (peripheral artery disease) (HCC)   Iron deficiency anemia   Hypothyroidism   Atrial fibrillation with RVR (HCC)   Long term (current) use of anticoagulants   Nonrheumatic aortic valve stenosis   Discharge Condition: Improved  Diet recommendation: Low-sodium, heart healthy  Filed Weights   06/09/23 0408 06/12/23 0612 06/13/23 0522  Weight: 53.9 kg 52.5 kg 51.9 kg    History of present illness:  77/F with sarcoidosis, CKD 3B, hypertension, hypothyroidism presented to the ED with dyspnea acute onset.  She was noted to be hypoxic initially, hypertensive. -Abs noted creatinine of 2.0, BUN 36, anion gap 14, BNP 967, troponin 95, 169, hemoglobin 10.0 -Chest x-ray noted hyperinflation pulmonary vascular congestion dense interstitial infiltrates  -Admitted, started on diuretics   12/2 developed atrial fibrillation with rapid ventricular response.  12/3 spontaneously converted to sinus rhythm    Hospital Course:   Acute systolic CHF, new onset -Echo 51/88 noted EF 20-25% global hypokinesis, normal RV, moderate to severe AVR previously EF was normal in 07/2019 -Diuresed with IV Lasix, volume status has improved -GDMT limited by CKD -Continue Jardiance, Coreg -Lasix 40 Mg daily resumed   Moderate to severe aortic regurgitation -Outpatient follow-up   Troponin elevation - likely due to  demand ischemia from CHF   Acute hypoxemic respiratory failure due to acute cardiogenic pulmonary edema.  -Resolved, weaned off O2   Atrial fibrillation (HCC) -New onset, on amiodarone, converted to sinus rhythm on 12/2  -Continue Eliquis  -Goal to avoid long-term amiodarone considering pulmonary sarcoidosis, COPD etc., hopefully can stop amiodarone in 30 days   AKI (acute kidney injury) (HCC) CKD stage 3b. Hypokalemia  -Baseline creatinine 1.6-2, now stable in the 2.6 range, likely cardiorenal, plateauing, anticipate improvement, check BMP   Sarcoidosis Multisystemic sarcoidosis.  Raynaud's disease/ arthritis.   -Continue home dose of prednisone 5 mg daily.  And leflunomide   COPD (chronic obstructive pulmonary disease) (HCC) No clinical signs of exacerbation. Plan to continue bronchodilator therapy.Marland Kitchen    PAD (peripheral artery disease) (HCC) Continue with aspirin and statin therapy.    Hypothyroidism Continue with levothyroxine   Myeloproliferative neoplasm.  Essential thrombocytosis, continue with hydroxyurea.  Follow up cell count.     Consultants: Cardiology  Discharge Exam: Vitals:   06/13/23 0736 06/13/23 1135  BP: 105/65 123/64  Pulse: 77 69  Resp: 18 16  Temp: 97.7 F (36.5 C) 97.6 F (36.4 C)  SpO2: 97% 94%    Gen: Awake, Alert, Oriented X 3,  HEENT: no JVD Lungs: Good air movement bilaterally, CTAB CVS: S1S2/RRR Abd: soft, Non tender, non distended, BS present Extremities: No edema Skin: no new rashes on exposed skin   Discharge Instructions   Discharge Instructions     Diet - low sodium heart healthy   Complete by: As directed    Increase activity slowly   Complete by: As directed       Allergies as of 06/13/2023  Reactions   Penicillins Anaphylaxis, Rash   Did it involve swelling of the face/tongue/throat, SOB, or low BP? Yes  Did it involve sudden or severe rash/hives, skin peeling, or any reaction on the inside of your mouth  or nose? No Did you need to seek medical attention at a hospital or doctor's office? No When did it last happen? Childhood If all above answers are "NO", may proceed with cephalosporin use.   Codeine Other (See Comments)   Vomiting   Pentazocine    Other reaction(s): itch        Medication List     STOP taking these medications    amLODipine 5 MG tablet Commonly known as: NORVASC       TAKE these medications    acetaminophen 325 MG tablet Commonly known as: TYLENOL Take 1 tablet (325 mg total) by mouth every 6 (six) hours. What changed:  when to take this reasons to take this   albuterol 108 (90 Base) MCG/ACT inhaler Commonly known as: VENTOLIN HFA Inhale 2 puffs into the lungs every 4 (four) hours as needed.   amiodarone 200 MG tablet Commonly known as: PACERONE Take 1 tablet (200 mg total) by mouth daily. Start taking on: June 14, 2023   apixaban 2.5 MG Tabs tablet Commonly known as: ELIQUIS Take 1 tablet (2.5 mg total) by mouth 2 (two) times daily.   aspirin EC 81 MG tablet Take 81 mg by mouth daily.   atorvastatin 80 MG tablet Commonly known as: LIPITOR Take 80 mg by mouth every evening.   carvedilol 12.5 MG tablet Commonly known as: COREG Take 1 tablet (12.5 mg total) by mouth 2 (two) times daily with a meal. What changed:  medication strength how much to take   empagliflozin 10 MG Tabs tablet Commonly known as: JARDIANCE Take 1 tablet (10 mg total) by mouth daily. Start taking on: June 14, 2023   folic acid 1 MG tablet Commonly known as: FOLVITE TAKE 1 TABLET BY MOUTH ONCE DAILY   furosemide 40 MG tablet Commonly known as: Lasix Take 1 tablet (40 mg total) by mouth daily.   hydroxyurea 500 MG capsule Commonly known as: HYDREA TAKE ONE CAPSULE BY MOUTH ON DAYS MONDAY-THURSDAY. DO not take friday, saturday, OR sunday What changed:  how much to take how to take this when to take this additional instructions   iron  polysaccharides 150 MG capsule Commonly known as: NIFEREX TAKE ONE CAPSULE BY MOUTH ONCE DAILY   leflunomide 20 MG tablet Commonly known as: ARAVA Take 20 mg by mouth daily.   levothyroxine 50 MCG tablet Commonly known as: SYNTHROID Take 50 mcg by mouth every morning.   melatonin 5 MG Tabs Take 5 mg by mouth at bedtime.   omeprazole 40 MG capsule Commonly known as: PRILOSEC Take 40 mg by mouth every evening.   predniSONE 5 MG tablet Commonly known as: DELTASONE Take 5 mg by mouth daily.   venlafaxine XR 150 MG 24 hr capsule Commonly known as: EFFEXOR-XR Take 150 mg by mouth every evening.   Vitamin D3 25 MCG (1000 UT) Caps TAKE TWO (2) CAPSULES BY MOUTH ONCE DAILY       Allergies  Allergen Reactions   Penicillins Anaphylaxis and Rash    Did it involve swelling of the face/tongue/throat, SOB, or low BP? Yes  Did it involve sudden or severe rash/hives, skin peeling, or any reaction on the inside of your mouth or nose? No Did you need to seek medical attention  at a hospital or doctor's office? No When did it last happen? Childhood If all above answers are "NO", may proceed with cephalosporin use.    Codeine Other (See Comments)    Vomiting    Pentazocine     Other reaction(s): itch    Follow-up Information     Gaston Islam., NP Follow up.   Specialty: Cardiology Why: Monday Jul 15, 2023 Arrive by 10:40 AMAppt at 10:55 AM (25 min) Contact information: [address] [place] [address] [phone]                  The results of significant diagnostics from this hospitalization (including imaging, microbiology, ancillary and laboratory) are listed below for reference.    Significant Diagnostic Studies: CT Angio Chest/Abd/Pel for Dissection W and/or W/WO  Result Date: 06/07/2023 CLINICAL DATA:  Shortness of breath. EXAM: CT ANGIOGRAPHY CHEST, ABDOMEN AND PELVIS TECHNIQUE: Non-contrast CT of the chest was initially  obtained. Multidetector CT imaging through the chest, abdomen and pelvis was performed using the standard protocol during bolus administration of intravenous contrast. Multiplanar reconstructed images and MIPs were obtained and reviewed to evaluate the vascular anatomy. RADIATION DOSE REDUCTION: This exam was performed according to the departmental dose-optimization program which includes automated exposure control, adjustment of the mA and/or kV according to patient size and/or use of iterative reconstruction technique. CONTRAST:  75mL OMNIPAQUE IOHEXOL 350 MG/ML SOLN COMPARISON:  March 21, 2022.  July 26, 2019. FINDINGS: CTA CHEST FINDINGS Cardiovascular: Atherosclerosis of thoracic aorta is noted without aneurysm or dissection. Great vessels are widely patent without significant stenosis. Normal cardiac size. No pericardial effusion. Mild coronary artery calcifications are noted. Mediastinum/Nodes: Moderate size sliding-type hiatal hernia is noted. No adenopathy. Thyroid gland is unremarkable. Lungs/Pleura: Mild bilateral pleural effusions are noted with adjacent subsegmental atelectasis. No pneumothorax is noted. Patchy airspace opacities are noted in both upper lobes and to some degree left lower lobe concerning for multifocal pneumonia or possibly edema. Musculoskeletal: No chest wall abnormality. No acute or significant osseous findings. Review of the MIP images confirms the above findings. CTA ABDOMEN AND PELVIS FINDINGS VASCULAR Aorta: Atherosclerosis of abdominal aorta is noted without aneurysm or dissection. Celiac: Patent without evidence of aneurysm, dissection, vasculitis or significant stenosis. SMA: Patent without evidence of aneurysm, dissection, vasculitis or significant stenosis. Renals: Moderate narrowing of the origin of right renal artery is noted secondary to calcified plaque. Moderate narrowing of proximal left renal artery is noted secondary to calcified plaque. IMA: Patent without  evidence of aneurysm, dissection, vasculitis or significant stenosis. Inflow: Patent without evidence of aneurysm, dissection, vasculitis or significant stenosis. Veins: No obvious venous abnormality within the limitations of this arterial phase study. Review of the MIP images confirms the above findings. NON-VASCULAR Hepatobiliary: Cholelithiasis. No biliary dilatation. Liver is unremarkable. Pancreas: Unremarkable. No pancreatic ductal dilatation or surrounding inflammatory changes. Spleen: Normal in size without focal abnormality. Adrenals/Urinary Tract: Adrenal glands appear normal. Right renal cyst is noted for which no further follow-up is required. No hydronephrosis or renal obstruction is noted. Urinary bladder is unremarkable. Stomach/Bowel: There is no evidence of bowel obstruction or inflammation. The appendix appears normal. Lymphatic: No significant adenopathy is noted. Reproductive: Status post hysterectomy. No adnexal masses. Other: No abdominal wall hernia or abnormality. No abdominopelvic ascites. Musculoskeletal: No acute or significant osseous findings. Review of the MIP images confirms the above findings. IMPRESSION: No evidence of thoracic or abdominal aortic dissection or aneurysm. Moderate stenoses are seen involving the  proximal portions of both renal arteries secondary to calcified plaque. Moderate size sliding-type hiatal hernia. Mild bilateral pleural effusions are noted with adjacent subsegmental atelectasis. Patchy airspace opacities are noted in both upper lobes and to some degree left lower lobe consistent with multifocal pneumonia or possibly edema. Cholelithiasis. Mild coronary artery calcifications are noted. Aortic Atherosclerosis (ICD10-I70.0). Electronically Signed   By: Lupita Raider M.D.   On: 06/07/2023 14:22   ECHOCARDIOGRAM COMPLETE  Result Date: 06/07/2023    ECHOCARDIOGRAM REPORT   Patient Name:   MANIAH KRATOCHVIL Date of Exam: 06/07/2023 Medical Rec #:  [id number]        Height:       59.0 in Accession #:    [id number]      Weight:       121.3 lb Date of Birth:  May 11, 1946       BSA:          1.491 m Patient Age:    77 years        BP:           155/74 mmHg Patient Gender: F               HR:           113 bpm. Exam Location:  Inpatient Procedure: 2D Echo, Color Doppler, Cardiac Doppler and Intracardiac            Opacification Agent Indications:     Dyspnea  History:         Patient has prior history of Echocardiogram examinations, most                  recent 07/30/2019.  Sonographer:     Darlys Gales Referring Phys:  [id number] PROSPER M AMPONSAH Diagnosing Phys: [place] IMPRESSIONS  1. 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 The left ventricle demonstrates global hypokinesis. The left ventricular internal cavity size was mildly dilated. There is mild left ventricular hypertrophy. Left ventricular diastolic parameters are indeterminate.  2. Right ventricular systolic function is normal. The right ventricular size is normal. There is normal pulmonary artery systolic pressure. The estimated right ventricular systolic pressure is 16.8 mmHg.  3. Left atrial size was severely dilated.  4. The mitral valve is degenerative. Mild to moderate mitral valve regurgitation.  5. The aortic valve was not well visualized. Aortic valve regurgitation is moderate to severe. Mild aortic valve stenosis.  6. Not well visualized.  7. The inferior vena cava is normal in size with greater than 50% respiratory variability, suggesting right atrial pressure of 3 mmHg. Conclusion(s)/Recommendation(s): Compared to prior echo, EF has declined. AI is at least moderate. Recommend CT chest to rule out dissection, the ascending aorta was not well visualized. 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  The left ventricle demonstrates global hypokinesis. The left ventricular internal cavity size was mildly dilated. There is mild left ventricular hypertrophy. Left ventricular diastolic parameters are indeterminate. Right Ventricle: The right ventricular size is normal. Right ventricular systolic function is normal. There is normal pulmonary artery systolic pressure. The tricuspid regurgitant velocity is 1.86 m/s, and with an assumed right atrial pressure of 3 mmHg,  the estimated right ventricular systolic pressure is 16.8 mmHg. Left Atrium: Left atrial size was severely dilated. Right Atrium: Right atrial size was normal in  size. Pericardium: There is no evidence of pericardial effusion. Mitral Valve: The mitral valve is degenerative in appearance. Mild to moderate mitral valve regurgitation. Tricuspid Valve: Tricuspid valve regurgitation is trivial. Aortic Valve: The aortic valve was not well visualized. Aortic valve regurgitation is moderate to severe. Aortic regurgitation PHT measures 250 msec. Mild aortic stenosis is present. Aortic valve mean gradient measures 13.0 mmHg. Aortic valve peak gradient measures 21.0 mmHg. Aortic valve area, by VTI measures 1.08 cm. Aorta: Not well visualized. Venous: The inferior vena cava is normal in size with greater than 50% respiratory variability, suggesting right atrial pressure of 3 mmHg. IAS/Shunts: The interatrial septum was not well visualized.  LEFT VENTRICLE PLAX 2D LVIDd:         5.10 cm LVIDs:         4.40 cm LV PW:         1.00 cm LV IVS:        1.10 cm LVOT diam:     1.80 cm LV SV:         43 LV SV Index:   29 LVOT Area:     2.54 cm  RIGHT VENTRICLE RV S prime:     13.30 cm/s TAPSE (M-mode): 2.1 cm LEFT ATRIUM           Index        RIGHT ATRIUM           Index LA Vol (A4C): 67.4 ml 45.20 ml/m  RA Area:     11.00 cm                                    RA Volume:   20.50 ml  13.75 ml/m  AORTIC VALVE AV Area (Vmax):    1.10 cm AV Area (Vmean):   1.03 cm AV Area  (VTI):     1.08 cm AV Vmax:           229.00 cm/s AV Vmean:          172.000 cm/s AV VTI:            0.397 m AV Peak Grad:      21.0 mmHg AV Mean Grad:      13.0 mmHg LVOT Vmax:         99.00 cm/s LVOT Vmean:        69.900 cm/s LVOT VTI:          0.168 m LVOT/AV VTI ratio: 0.42 AI PHT:            250 msec MITRAL VALVE                TRICUSPID VALVE MV Area (PHT): 4.68 cm     TR Peak grad:   13.8 mmHg MV Decel Time: 162 msec     TR Vmax:        186.00 cm/s MV E velocity: 161.00 cm/s                             SHUNTS                             Systemic VTI:  0.17 m  Systemic Diam: 1.80 cm Carolan Clines Electronically signed by Carolan Clines Signature Date/Time: 06/07/2023/11:58:50 AM    Final (Updated)    DG Chest Port 1 View  Result Date: 06/07/2023 CLINICAL DATA:  Shortness of breath EXAM: PORTABLE CHEST 1 VIEW COMPARISON:  11/28/2020 FINDINGS: Hazy opacification of the bilateral chest. Cardiomediastinal contours are distorted by rotation with accentuated ascending aorta and foreshortening of the cardiac size. No visible effusion or pneumothorax. IMPRESSION: Extensive and bilateral airspace disease favoring edema over infection. Electronically Signed   By: Tiburcio Pea M.D.   On: 06/07/2023 06:40    Microbiology: Recent Results (from the past 240 hour(s))  Culture, blood (single) w Reflex to ID Panel     Status: None   Collection Time: 06/07/23  6:12 AM   Specimen: BLOOD  Result Value Ref Range Status   Specimen Description BLOOD BLOOD RIGHT HAND  Final   Special Requests   Final    BOTTLES DRAWN AEROBIC AND ANAEROBIC Blood Culture results may not be optimal due to an inadequate volume of blood received in culture bottles   Culture   Final    NO GROWTH 5 DAYS Performed at [facility], [address], [place], [place] [address]    Report Status 06/12/2023 FINAL  Final  Resp panel by RT-PCR (RSV, Flu A&B, Covid) Anterior Nasal Swab     Status: None    Collection Time: 06/07/23  6:12 AM   Specimen: Anterior Nasal Swab  Result Value Ref Range Status   SARS Coronavirus 2 by RT PCR NEGATIVE NEGATIVE Final   Influenza A by PCR NEGATIVE NEGATIVE Final   Influenza B by PCR NEGATIVE NEGATIVE Final    Comment: (NOTE) The Xpert Xpress SARS-CoV-2/FLU/RSV plus assay is intended as an aid in the diagnosis of influenza from Nasopharyngeal swab specimens and should not be used as a sole basis for treatment. Nasal washings and aspirates are unacceptable for Xpert Xpress SARS-CoV-2/FLU/RSV testing.  Fact Sheet for Patients: BloggerCourse.com  Fact Sheet for Healthcare Providers: SeriousBroker.it  This test is not yet approved or cleared by the [place] FDA and has been authorized for detection and/or diagnosis of SARS-CoV-2 by FDA under an Emergency Use Authorization (EUA). This EUA will remain in effect (meaning this test can be used) for the duration of the COVID-19 declaration under Section 564(b)(1) of the Act, 21 U.S.C. section 360bbb-3(b)(1), unless the authorization is terminated or revoked.     Resp Syncytial Virus by PCR NEGATIVE NEGATIVE Final    Comment: (NOTE) Fact Sheet for Patients: BloggerCourse.com  Fact Sheet for Healthcare Providers: SeriousBroker.it  This test is not yet approved or cleared by the [place] FDA and has been authorized for detection and/or diagnosis of SARS-CoV-2 by FDA under an Emergency Use Authorization (EUA). This EUA will remain in effect (meaning this test can be used) for the duration of the COVID-19 declaration under Section 564(b)(1) of the Act, 21 U.S.C. section 360bbb-3(b)(1), unless the authorization is terminated or revoked.  Performed at [facility], [address] [place], [place] [address]   Culture, blood (single)     Status: None   Collection Time: 06/07/23  7:01 AM    Specimen: BLOOD  Result Value Ref Range Status   Specimen Description BLOOD BLOOD LEFT HAND  Final   Special Requests   Final    AEROBIC BOTTLE ONLY Blood Culture results may not be optimal due to an inadequate volume of blood received in culture bottles  Culture   Final    NO GROWTH 5 DAYS Performed at [facility], [address], [place], [place] [address]    Report Status 06/12/2023 FINAL  Final     Labs: Basic Metabolic Panel: Recent Labs  Lab 06/07/23 1225 06/08/23 0439 06/09/23 0545 06/10/23 0738 06/10/23 0942 06/11/23 0742 06/12/23 0329 06/13/23 0254  NA  --    < > 139 141 140 138 137 138  K  --    < > 3.8 3.4* 3.6 3.9 3.2* 3.5  CL  --    < > 110 108 107 103 106 106  CO2  --    < > 20* 21* 18* 20* 21* 19*  GLUCOSE  --    < > 93 82 99 100* 92 94  BUN  --    < > 39* 41* 45* 45* 42* 38*  CREATININE  --    < > 2.40* 2.25* 2.36* 2.68* 2.65* 2.44*  CALCIUM  --    < > 8.9 8.8* 9.1 8.9 8.5* 8.6*  MG 2.4  --  2.1  --   --   --   --   --   PHOS 3.4  --   --   --   --   --   --   --    < > = values in this interval not displayed.   Liver Function Tests: Recent Labs  Lab 06/07/23 0630  AST 42*  ALT 25  ALKPHOS 76  BILITOT 0.4  PROT 6.2*  ALBUMIN 3.4*   No results for input(s): "LIPASE", "AMYLASE" in the last 168 hours. No results for input(s): "AMMONIA" in the last 168 hours. CBC: Recent Labs  Lab 06/07/23 0625 06/07/23 0630 06/08/23 0439  WBC  --  14.3* 13.7*  NEUTROABS  --  10.6*  --   HGB 9.9* 10.0* 9.0*  HCT 29.0* 33.0* 28.0*  MCV  --  97.3 92.1  PLT  --  579* 501*   Cardiac Enzymes: No results for input(s): "CKTOTAL", "CKMB", "CKMBINDEX", "TROPONINI" in the last 168 hours. BNP: BNP (last 3 results) Recent Labs    06/07/23 0630  BNP 967.4*    ProBNP (last 3 results) No results for input(s): "PROBNP" in the last 8760 hours.  CBG: No results for input(s): "GLUCAP" in the last 168 hours.     Signed:  Zannie Cove MD.  [facility] 06/13/2023, 1:03 PM

## 2023-06-13 NOTE — Plan of Care (Signed)

## 2023-06-13 NOTE — Progress Notes (Signed)
Mobility Specialist Progress Note:   06/13/23 1100  Mobility  Activity Ambulated with assistance in hallway  Level of Assistance Modified independent, requires aide device or extra time  Assistive Device Front wheel walker  Distance Ambulated (ft) 400 ft  Activity Response Tolerated well  Mobility Referral Yes  Mobility visit 1 Mobility  Mobility Specialist Start Time (ACUTE ONLY) 1042  Mobility Specialist Stop Time (ACUTE ONLY) 1052  Mobility Specialist Time Calculation (min) (ACUTE ONLY) 10 min   Pre Mobility: 69 HR  During Mobility: 92 HR  Pt received in bed, agreeable to mobility. Requesting to use RW d/t pt feeling slightly unsteady without AD previously. Pt c/o slight BLE pain, otherwise asx throughout. Pt returned to bed with call bell in reach and all needs met.   Leory Plowman  Mobility Specialist Please contact via [employer] office at [phone]

## 2023-07-14 NOTE — Progress Notes (Signed)
 Cardiology Office Note    Patient Name: Alexis Murphy Date of Encounter: 07/15/2023  Primary Care Provider:  Okey Carlin Redbird, MD Primary Cardiologist:  Ozell Fell, MD Primary Electrophysiologist: None   Past Medical History    Past Medical History:  Diagnosis Date   Arthritis    Hyperlipidemia    Sarcoidosis     History of Present Illness  Alexis Murphy is a 78 y.o. female with a PMH of HFrEF, moderate MR and moderate-severe AI, new LBBB, PAD, HTN, HLD PAF, pulmonary sarcoidosis hypothyroidism, chronic thrombocytopenia who presents today for posthospital follow-up.  Alexis Murphy has a PMH of PAD followed by [facility] and was seen in 2021 with new onset AF in the setting of sepsis and was treated with amiodarone  and Cardizem  with conversion to sinus rhythm.  She was not started on [facility] due to history of thrombocytopenia.  She presented to the ED via EMS with complaint of respiratory distress in the setting of COPD.  She was satting 80% on RA and was started on BiPAP with BNP measuring 967 and chest x-ray showing bilateral airspace disease favoring edema over infection.  2D echo was completed showing reduced EF of 20-25% with mild LVH and mild to moderate MR with moderate severe AI.  She was started on IV Lasix  for heart cath candidate due to GFR of 20.  She underwent CTA that showed no acute aortic pathology.  She developed AF with RVR and was treated with amiodarone  drip with conversion to sinus rhythm.  She had improvement to volume status and was continued on Jardiance  and carvedilol  with no up titration of GDMT in the setting of CKD stage IIIb.  She was continued on amiodarone  with goal to DC in the next 30 days due to pulmonary sarcoidosis.  She was discharged on 06/13/2023 on Eliquis  2.5 mg and amiodarone  200 mg.  Alexis Murphy presents today for posthospital follow-up alone.  Today's visit she reports doing well with no new cardiac complaints since her hospitalization. She reports no new  palpitations or fast heartbeat. She has been adhering to her prescribed medications, including Eliquis , and has been monitoring her fluid intake and weight. She denies any new complaints since her hospital discharge, except for significant pain and difficulty walking due to arthritis in both knees. She has been receiving injections for her knee pain but reports no improvement.  She reports having a normal appetite and has been monitoring her fluid intake since her discharge.  Patient denies chest pain, palpitations, dyspnea, PND, orthopnea, nausea, vomiting, dizziness, syncope, edema, weight gain, or early satiety.  Review of Systems  Please see the history of present illness.    All other systems reviewed and are otherwise negative except as noted above.  Physical Exam    Wt Readings from Last 3 Encounters:  07/15/23 116 lb (52.6 kg)  06/13/23 114 lb 8 oz (51.9 kg)  05/13/23 121 lb 8 oz (55.1 kg)   VS: Vitals:   07/15/23 1054  BP: 128/60  Pulse: 73  SpO2: 93%  ,Body mass index is 23.43 kg/m. GEN: Well nourished, well developed in no acute distress Neck: No JVD; No carotid bruits Pulmonary: Clear to auscultation  Cardiovascular: Normal rate. Regular rhythm. Normal S1. Normal S2.   Murmurs: Systolic murmur present at RUSB ABDOMEN: Soft, non-tender, non-distended EXTREMITIES:  No edema; No deformity   EKG/LABS/ Recent Cardiac Studies   ECG personally reviewed by me today -none completed today  Risk Assessment/Calculations:    CHA2DS2-VASc  Score = 7   This indicates a 11.2% annual risk of stroke. The patient's score is based upon: CHF History: 1 HTN History: 1 Diabetes History: 1 Stroke History: 0 Vascular Disease History: 1 Age Score: 2 Gender Score: 1         Lab Results  Component Value Date   WBC 13.7 (H) 06/08/2023   HGB 9.0 (L) 06/08/2023   HCT 28.0 (L) 06/08/2023   MCV 92.1 06/08/2023   PLT 501 (H) 06/08/2023   Lab Results  Component Value Date    CREATININE 2.44 (H) 06/13/2023   BUN 38 (H) 06/13/2023   NA 138 06/13/2023   K 3.5 06/13/2023   CL 106 06/13/2023   CO2 19 (L) 06/13/2023   Lab Results  Component Value Date   TRIG 167 (H) 07/26/2019    No results found for: HGBA1C Assessment & Plan    1.  HFrEF: -2D echo completed showing EF of 20-25% with severely decreased LV function and global hypokinesis with severely dilated LA and mild to moderate MR with moderate to severe AVR -During today's visit she is euvolemic on exam with NYHA class II symptoms. -We will check BMP today as well as CBC BMET -We will plan to add Entresto and titrate medications further if renal function is stable. -We will plan to repeat 2D echo in 2 months to evaluate LV function -Low sodium diet, fluid restriction <2L, and daily weights encouraged. Educated to contact our office for weight gain of 2 lbs overnight or 5 lbs in one week.   2.  Moderate AI/MR: -2D echo completed showing EF of 20-25% with severely decreased LV function and global hypokinesis with severely dilated LA and mild to moderate MR with moderate to severe AVR  3.  Paroxysmal AF: Recent hospitalization for respiratory distress during which patient went into AFib. Amiodarone  was initiated but due to patient's pulmonary history, the plan is to discontinue it. No palpitations or fast heartbeat reported since hospital discharge. -Is sinus rhythm with controlled rate of 73 bpm. -Discontinue Amiodarone . -Order 30-day heart monitor to assess for any recurrence of AFib.  4.  History of sarcoidosis: -Currently followed by rheumatology -We will discontinue amiodarone  today -Continue current treatment plan as directed  5.  History of COPD: -Patient reports no shortness of breath or exertional breathing. -Continue current therapy per pulmonology  6.  History of PAD: -Patient reports difficulty walking due to pain in knees, attributed to arthritis. Also has a history of peripheral  artery disease. -Continue current GDMT advised to follow-up with [facility] if claudication-like symptoms occur.  Disposition: Follow-up with Ozell Fell, MD or APP in 3 months    Signed, Wyn Raddle, Jackee Shove, NP 07/15/2023, 11:30 AM  [facility]

## 2023-07-15 ENCOUNTER — Encounter: Payer: Self-pay | Admitting: *Deleted

## 2023-07-15 ENCOUNTER — Other Ambulatory Visit: Payer: Self-pay | Admitting: Nurse Practitioner

## 2023-07-15 ENCOUNTER — Ambulatory Visit: Payer: Medicare HMO | Attending: Nurse Practitioner | Admitting: Nurse Practitioner

## 2023-07-15 ENCOUNTER — Encounter: Payer: Self-pay | Admitting: Nurse Practitioner

## 2023-07-15 VITALS — BP 128/60 | HR 73 | Ht 59.0 in | Wt 116.0 lb

## 2023-07-15 DIAGNOSIS — I739 Peripheral vascular disease, unspecified: Secondary | ICD-10-CM

## 2023-07-15 DIAGNOSIS — I350 Nonrheumatic aortic (valve) stenosis: Secondary | ICD-10-CM | POA: Diagnosis not present

## 2023-07-15 DIAGNOSIS — I480 Paroxysmal atrial fibrillation: Secondary | ICD-10-CM | POA: Diagnosis not present

## 2023-07-15 DIAGNOSIS — J439 Emphysema, unspecified: Secondary | ICD-10-CM | POA: Diagnosis not present

## 2023-07-15 DIAGNOSIS — I5020 Unspecified systolic (congestive) heart failure: Secondary | ICD-10-CM

## 2023-07-15 DIAGNOSIS — D869 Sarcoidosis, unspecified: Secondary | ICD-10-CM | POA: Diagnosis not present

## 2023-07-15 MED ORDER — CARVEDILOL 12.5 MG PO TABS: 12.5000 mg | ORAL_TABLET | Freq: Two times a day (BID) | ORAL | 3 refills | Status: DC

## 2023-07-15 MED ORDER — AMLODIPINE BESYLATE 5 MG PO TABS: 5.0000 mg | ORAL_TABLET | Freq: Every day | ORAL | 3 refills | Status: DC

## 2023-07-15 MED ORDER — ATORVASTATIN CALCIUM 80 MG PO TABS: 80.0000 mg | ORAL_TABLET | Freq: Every evening | ORAL | 3 refills | Status: DC

## 2023-07-15 MED ORDER — APIXABAN 2.5 MG PO TABS: 2.5000 mg | ORAL_TABLET | Freq: Two times a day (BID) | ORAL | 3 refills | Status: DC

## 2023-07-15 MED ORDER — FUROSEMIDE 40 MG PO TABS: 40.0000 mg | ORAL_TABLET | Freq: Every day | ORAL | 3 refills | Status: DC

## 2023-07-15 MED ORDER — EMPAGLIFLOZIN 10 MG PO TABS: 10.0000 mg | ORAL_TABLET | Freq: Every day | ORAL | 3 refills | Status: DC

## 2023-07-15 NOTE — Progress Notes (Signed)
 Patient enrolled for [employer]/ [employer] to ship a 30 day cardiac event monitor to her address on file.  Dr. Excell Seltzer to read.

## 2023-07-15 NOTE — Patient Instructions (Signed)
 Medication Instructions:  Your physician has recommended you make the following change in your medication:   STOP Amiodarone   *If you need a refill on your cardiac medications before your next appointment, please call your pharmacy*   Lab Work: TODAY:  BMET & CBC  If you have labs (blood work) drawn today and your tests are completely normal, you will receive your results only by: MyChart Message (if you have MyChart) OR A paper copy in the mail If you have any lab test that is abnormal or we need to change your treatment, we will call you to review the results.   Testing/Procedures: Your physician has requested that you have an echocardiogram IN 2 MONTHS. Echocardiography is a painless test that uses sound waves to create images of your heart. It provides your doctor with information about the size and shape of your heart and how well your heart's chambers and valves are working. This procedure takes approximately one hour. There are no restrictions for this procedure. Please do NOT wear cologne, perfume, aftershave, or lotions (deodorant is allowed). Please arrive 15 minutes prior to your appointment time.  Please note: We ask at that you not bring children with you during ultrasound (echo/ vascular) testing. Due to room size and safety concerns, children are not allowed in the ultrasound rooms during exams. Our front office staff cannot provide observation of children in our lobby area while testing is being conducted. An adult accompanying a patient to their appointment will only be allowed in the ultrasound room at the discretion of the ultrasound technician under special circumstances. We apologize for any inconvenience.    [employer] Cardiac Event Monitor Instructions  Your physician has requested you wear your cardiac event monitor for _____ days, (1-30). [employer] may call or text to confirm a shipping address. The monitor will be sent to a land address via UPS. [employer] will  not ship a monitor to a PO BOX. It typically takes 3-5 days to receive your monitor after it has been enrolled. [employer] will assist with USPS tracking if your package is delayed. The telephone number for [employer] is [phone]. Once you have received your monitor, please review the enclosed instructions. Instruction tutorials can also be viewed under help and settings on the enclosed cell phone. Your monitor has already been registered assigning a specific monitor serial # to you.  Billing and Self Pay Discount Information  [employer] has been provided the insurance information we had on file for you.  If your insurance has been updated, please call [employer] at [phone] to provide them with your updated insurance information.   [employer] offers a discounted Self Pay option for patients who have insurance that does not cover their cardiac event monitor or patients without insurance.  The discounted cost of a Self Pay Cardiac Event Monitor would be $225.00 , if the patient contacts [employer] at [phone] within 7 days of applying the monitor to make payment arrangements.  If the patient does not contact [employer] within 7 days of applying the monitor, the cost of the cardiac event monitor will be $350.00.  Applying the monitor  Remove cell phone from case and turn it on. The cell phone works as it consultant and needs to be within [employer] of you at all times. The cell phone will need to be charged on a daily basis. We recommend you plug the cell phone into the enclosed charger at your bedside table every night.  Monitor batteries: You will receive two monitor batteries  labelled #1 and #2. These are your recorders. Plug battery #2 onto the second connection on the enclosed charger. Keep one battery on the charger at all times. This will keep the monitor battery deactivated. It will also keep it fully charged for when you need to switch your monitor batteries. A small light  will be blinking on the battery emblem when it is charging. The light on the battery emblem will remain on when the battery is fully charged.  Open package of a Monitor strip. Insert battery #1 into black hood on strip and gently squeeze monitor battery onto connection as indicated in instruction booklet. Set aside while preparing skin.  Choose location for your strip, vertical or horizontal, as indicated in the instruction booklet. Shave to remove all hair from location. There cannot be any lotions, oils, powders, or colognes on skin where monitor is to be applied. Wipe skin clean with enclosed Saline wipe. Dry skin completely.  Peel paper labeled #1 off the back of the Monitor strip exposing the adhesive. Place the monitor on the chest in the vertical or horizontal position shown in the instruction booklet. One arrow on the monitor strip must be pointing upward. Carefully remove paper labeled #2, attaching remainder of strip to your skin. Try not to create any folds or wrinkles in the strip as you apply it.  Firmly press and release the circle in the center of the monitor battery. You will hear a small beep. This is turning the monitor battery on. The heart emblem on the monitor battery will light up every 5 seconds if the monitor battery in turned on and connected to the patient securely. Do not push and hold the circle down as this turns the monitor battery off. The cell phone will locate the monitor battery. A screen will appear on the cell phone checking the connection of your monitor strip. This may read poor connection initially but change to good connection within the next minute. Once your monitor accepts the connection you will hear a series of 3 beeps followed by a climbing crescendo of beeps. A screen will appear on the cell phone showing the two monitor strip placement options. Touch the picture that demonstrates where you applied the monitor strip.  Your monitor strip and  battery are waterproof. You are able to shower, bathe, or swim with the monitor on. They just ask you do not submerge deeper than 3 feet underwater. We recommend removing the monitor if you are swimming in a lake, river, or ocean.  Your monitor battery will need to be switched to a fully charged monitor battery approximately once a week. The cell phone will alert you of an action which needs to be made.  On the cell phone, tap for details to reveal connection status, monitor battery status, and cell phone battery status. The green dots indicates your monitor is in good status. A red dot indicates there is something that needs your attention.  To record a symptom, click the circle on the monitor battery. In 30-60 seconds a list of symptoms will appear on the cell phone. Select your symptom and tap save. Your monitor will record a sustained or significant arrhythmia regardless of you clicking the button. Some patients do not feel the heart rhythm irregularities. [employer] will notify us  of any serious or critical events.  Refer to instruction booklet for instructions on switching batteries, changing strips, the Do not disturb or Pause features, or any additional questions.  Call [employer] at [phone], to  confirm your monitor is transmitting and record your baseline. They will answer any questions you may have regarding the monitor instructions at that time.  Returning the monitor to [employer]  Place all equipment back into blue box. Peel off strip of paper to expose adhesive and close box securely. There is a prepaid UPS shipping label on this box. Drop in a UPS drop box, or at a UPS facility like [employer]. You may also contact [employer] to arrange UPS to pick up monitor package at your home.  Follow-Up: At [facility], you and your health needs are our priority.  As part of our continuing mission to provide you with exceptional heart care, we have created designated  Provider Care Teams.  These Care Teams include your primary Cardiologist (physician) and Advanced Practice Providers (APPs -  Physician Assistants and Nurse Practitioners) who all work together to provide you with the care you need, when you need it.  We recommend signing up for the patient portal called MyChart.  Sign up information is provided on this After Visit Summary.  MyChart is used to connect with patients for Virtual Visits (Telemedicine).  Patients are able to view lab/test results, encounter notes, upcoming appointments, etc.  Non-urgent messages can be sent to your provider as well.   To learn more about what you can do with MyChart, go to [email or web address].    Your next appointment:   3 month(s)  Provider:   Ozell Fell, MD  or Jackee Alberts, NP         Other Instructions

## 2023-07-16 ENCOUNTER — Other Ambulatory Visit (HOSPITAL_BASED_OUTPATIENT_CLINIC_OR_DEPARTMENT_OTHER): Payer: Self-pay

## 2023-07-16 LAB — MAGNESIUM: Magnesium: 2 mg/dL (ref 1.6–2.3)

## 2023-07-16 LAB — CBC
Hematocrit: 27.2 % — ABNORMAL LOW (ref 34.0–46.6)
Hemoglobin: 8.4 g/dL — ABNORMAL LOW (ref 11.1–15.9)
MCH: 28.2 pg (ref 26.6–33.0)
MCHC: 30.9 g/dL — ABNORMAL LOW (ref 31.5–35.7)
MCV: 91 fL (ref 79–97)
Platelets: 594 10*3/uL — ABNORMAL HIGH (ref 150–450)
RBC: 2.98 x10E6/uL — ABNORMAL LOW (ref 3.77–5.28)
RDW: 14.4 % (ref 11.7–15.4)
WBC: 14.2 10*3/uL — ABNORMAL HIGH (ref 3.4–10.8)

## 2023-07-16 LAB — PRO B NATRIURETIC PEPTIDE: NT-Pro BNP: 4097 pg/mL — ABNORMAL HIGH (ref 0–738)

## 2023-07-16 LAB — BASIC METABOLIC PANEL
BUN/Creatinine Ratio: 19 (ref 12–28)
BUN: 43 mg/dL — ABNORMAL HIGH (ref 8–27)
CO2: 21 mmol/L (ref 20–29)
Calcium: 9.1 mg/dL (ref 8.7–10.3)
Chloride: 102 mmol/L (ref 96–106)
Creatinine, Ser: 2.22 mg/dL — ABNORMAL HIGH (ref 0.57–1.00)
Glucose: 84 mg/dL (ref 70–99)
Potassium: 4.5 mmol/L (ref 3.5–5.2)
Sodium: 140 mmol/L (ref 134–144)
eGFR: 22 mL/min/{1.73_m2} — ABNORMAL LOW (ref >59)

## 2023-07-20 DIAGNOSIS — I480 Paroxysmal atrial fibrillation: Secondary | ICD-10-CM

## 2023-07-20 DIAGNOSIS — D869 Sarcoidosis, unspecified: Secondary | ICD-10-CM

## 2023-07-20 DIAGNOSIS — I350 Nonrheumatic aortic (valve) stenosis: Secondary | ICD-10-CM

## 2023-07-20 DIAGNOSIS — I5020 Unspecified systolic (congestive) heart failure: Secondary | ICD-10-CM | POA: Diagnosis not present

## 2023-07-26 ENCOUNTER — Telehealth: Payer: Self-pay | Admitting: Cardiovascular Disease

## 2023-07-26 NOTE — Telephone Encounter (Signed)
Spoke with Corrie Dandy who states that the patient diet at home this morning. They had on record that she was last seen here by Robin Searing, NP. She would like to know if MD would be able to sign off on her death certificate. Dr. Excell Seltzer consulted on her in the hospital in November 2024. Advised that I would send a message to him but not sure if he would be the appropriate person to sign off. I gave Corrie Dandy the name of the patient's PCP who she will also reach out to.

## 2023-07-26 NOTE — Telephone Encounter (Signed)
Mary with [facility] states patient died this morning at 8:31 AM and she would like to know if someone can sign her death certificate. She states Alden Server, NP, is not in the [facility] system and she would like to know if an MD would be available to sign. Please advise.

## 2023-08-06 ENCOUNTER — Ambulatory Visit: Payer: Medicare HMO | Attending: Nurse Practitioner

## 2023-08-06 DIAGNOSIS — I5020 Unspecified systolic (congestive) heart failure: Secondary | ICD-10-CM

## 2023-08-06 DIAGNOSIS — I350 Nonrheumatic aortic (valve) stenosis: Secondary | ICD-10-CM

## 2023-08-06 DIAGNOSIS — I480 Paroxysmal atrial fibrillation: Secondary | ICD-10-CM

## 2023-08-06 DIAGNOSIS — D869 Sarcoidosis, unspecified: Secondary | ICD-10-CM

## 2023-08-10 DEATH — deceased

## 2023-09-02 ENCOUNTER — Other Ambulatory Visit: Payer: Self-pay | Admitting: Hematology

## 2023-09-02 DIAGNOSIS — D473 Essential (hemorrhagic) thrombocythemia: Secondary | ICD-10-CM

## 2023-09-03 ENCOUNTER — Encounter: Payer: Self-pay | Admitting: Hematology

## 2023-09-04 NOTE — Telephone Encounter (Signed)
 Pt has passed away per her son.. death certificate chart. Sent to med rec.

## 2023-09-12 ENCOUNTER — Ambulatory Visit (HOSPITAL_COMMUNITY): Payer: Medicare HMO

## 2023-09-17 ENCOUNTER — Other Ambulatory Visit: Payer: Self-pay

## 2023-09-17 DIAGNOSIS — D471 Chronic myeloproliferative disease: Secondary | ICD-10-CM

## 2023-09-17 DIAGNOSIS — D509 Iron deficiency anemia, unspecified: Secondary | ICD-10-CM

## 2023-09-18 ENCOUNTER — Inpatient Hospital Stay: Payer: Medicare HMO

## 2023-09-18 ENCOUNTER — Inpatient Hospital Stay: Payer: Medicare HMO | Admitting: Hematology

## 2023-10-08 ENCOUNTER — Other Ambulatory Visit: Payer: Medicare HMO

## 2023-11-25 ENCOUNTER — Encounter (HOSPITAL_COMMUNITY): Payer: Medicare HMO

## 2023-11-25 ENCOUNTER — Ambulatory Visit: Payer: Medicare HMO | Admitting: Surgery
